# Patient Record
Sex: Female | Born: 1945 | Race: White | Hispanic: No | Marital: Married | Smoking: Never smoker
Health system: Southern US, Community
[De-identification: ages and names within clinical notes are randomized; demographics above are authoritative.]

## PROBLEM LIST (undated history)

## (undated) DIAGNOSIS — H6980 Other specified disorders of Eustachian tube, unspecified ear: Secondary | ICD-10-CM

## (undated) DIAGNOSIS — H6990 Unspecified Eustachian tube disorder, unspecified ear: Secondary | ICD-10-CM

## (undated) DIAGNOSIS — R911 Solitary pulmonary nodule: Secondary | ICD-10-CM

## (undated) DIAGNOSIS — I10 Essential (primary) hypertension: Secondary | ICD-10-CM

## (undated) DIAGNOSIS — E119 Type 2 diabetes mellitus without complications: Secondary | ICD-10-CM

## (undated) DIAGNOSIS — F419 Anxiety disorder, unspecified: Secondary | ICD-10-CM

## (undated) DIAGNOSIS — J189 Pneumonia, unspecified organism: Secondary | ICD-10-CM

## (undated) DIAGNOSIS — I639 Cerebral infarction, unspecified: Secondary | ICD-10-CM

## (undated) DIAGNOSIS — I779 Disorder of arteries and arterioles, unspecified: Secondary | ICD-10-CM

## (undated) DIAGNOSIS — E039 Hypothyroidism, unspecified: Secondary | ICD-10-CM

## (undated) DIAGNOSIS — M797 Fibromyalgia: Secondary | ICD-10-CM

## (undated) DIAGNOSIS — G4733 Obstructive sleep apnea (adult) (pediatric): Secondary | ICD-10-CM

## (undated) DIAGNOSIS — G709 Myoneural disorder, unspecified: Secondary | ICD-10-CM

## (undated) DIAGNOSIS — F32A Depression, unspecified: Secondary | ICD-10-CM

## (undated) DIAGNOSIS — K219 Gastro-esophageal reflux disease without esophagitis: Secondary | ICD-10-CM

## (undated) DIAGNOSIS — M1990 Unspecified osteoarthritis, unspecified site: Secondary | ICD-10-CM

## (undated) DIAGNOSIS — M8580 Other specified disorders of bone density and structure, unspecified site: Secondary | ICD-10-CM

## (undated) DIAGNOSIS — F329 Major depressive disorder, single episode, unspecified: Secondary | ICD-10-CM

## (undated) DIAGNOSIS — E785 Hyperlipidemia, unspecified: Secondary | ICD-10-CM

## (undated) HISTORY — DX: Type 2 diabetes mellitus without complications: E11.9

## (undated) HISTORY — DX: Unspecified eustachian tube disorder, unspecified ear: H69.90

## (undated) HISTORY — DX: Other specified disorders of bone density and structure, unspecified site: M85.80

## (undated) HISTORY — PX: COLONOSCOPY: SHX174

## (undated) HISTORY — PX: DILATION AND CURETTAGE OF UTERUS: SHX78

## (undated) HISTORY — DX: Disorder of arteries and arterioles, unspecified: I77.9

## (undated) HISTORY — DX: Essential (primary) hypertension: I10

## (undated) HISTORY — PX: TONSILLECTOMY: SUR1361

## (undated) HISTORY — DX: Hypothyroidism, unspecified: E03.9

## (undated) HISTORY — PX: TUBAL LIGATION: SHX77

## (undated) HISTORY — PX: NASAL SINUS SURGERY: SHX719

## (undated) HISTORY — DX: Solitary pulmonary nodule: R91.1

## (undated) HISTORY — PX: OTHER SURGICAL HISTORY: SHX169

## (undated) HISTORY — DX: Hyperlipidemia, unspecified: E78.5

## (undated) HISTORY — DX: Obstructive sleep apnea (adult) (pediatric): G47.33

## (undated) HISTORY — DX: Other specified disorders of Eustachian tube, unspecified ear: H69.80

## (undated) HISTORY — PX: ANKLE SURGERY: SHX546

## (undated) HISTORY — DX: Gastro-esophageal reflux disease without esophagitis: K21.9

## (undated) HISTORY — PX: EYE SURGERY: SHX253

## (undated) HISTORY — PX: CHOLECYSTECTOMY: SHX55

## (undated) HISTORY — PX: TOTAL ABDOMINAL HYSTERECTOMY: SHX209

## (undated) HISTORY — PX: BUNIONECTOMY: SHX129

---
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
## 1998-05-16 ENCOUNTER — Other Ambulatory Visit: Admission: RE | Admit: 1998-05-16 | Discharge: 1998-05-16 | Payer: Self-pay | Admitting: Orthopedic Surgery

## 1998-07-09 ENCOUNTER — Emergency Department (HOSPITAL_COMMUNITY): Admission: EM | Admit: 1998-07-09 | Discharge: 1998-07-09 | Payer: Self-pay | Admitting: Emergency Medicine

## 1998-07-09 ENCOUNTER — Encounter: Payer: Self-pay | Admitting: Emergency Medicine

## 1998-12-20 ENCOUNTER — Encounter: Payer: Self-pay | Admitting: Emergency Medicine

## 1998-12-20 ENCOUNTER — Inpatient Hospital Stay (HOSPITAL_COMMUNITY): Admission: EM | Admit: 1998-12-20 | Discharge: 1998-12-22 | Payer: Self-pay | Admitting: Emergency Medicine

## 1998-12-22 ENCOUNTER — Encounter (HOSPITAL_BASED_OUTPATIENT_CLINIC_OR_DEPARTMENT_OTHER): Payer: Self-pay | Admitting: Internal Medicine

## 1999-02-06 ENCOUNTER — Encounter: Admission: RE | Admit: 1999-02-06 | Discharge: 1999-02-06 | Payer: Self-pay | Admitting: Otolaryngology

## 1999-02-06 ENCOUNTER — Encounter: Payer: Self-pay | Admitting: Otolaryngology

## 1999-02-20 ENCOUNTER — Emergency Department (HOSPITAL_COMMUNITY): Admission: EM | Admit: 1999-02-20 | Discharge: 1999-02-20 | Payer: Self-pay | Admitting: Emergency Medicine

## 1999-04-16 ENCOUNTER — Emergency Department (HOSPITAL_COMMUNITY): Admission: EM | Admit: 1999-04-16 | Discharge: 1999-04-16 | Payer: Self-pay | Admitting: Emergency Medicine

## 1999-05-08 ENCOUNTER — Emergency Department (HOSPITAL_COMMUNITY): Admission: EM | Admit: 1999-05-08 | Discharge: 1999-05-08 | Payer: Self-pay | Admitting: Emergency Medicine

## 1999-05-16 ENCOUNTER — Emergency Department (HOSPITAL_COMMUNITY): Admission: EM | Admit: 1999-05-16 | Discharge: 1999-05-16 | Payer: Self-pay | Admitting: Emergency Medicine

## 1999-05-22 ENCOUNTER — Emergency Department (HOSPITAL_COMMUNITY): Admission: EM | Admit: 1999-05-22 | Discharge: 1999-05-22 | Payer: Self-pay

## 1999-05-29 ENCOUNTER — Ambulatory Visit (HOSPITAL_COMMUNITY): Admission: RE | Admit: 1999-05-29 | Discharge: 1999-05-29 | Payer: Self-pay | Admitting: Gastroenterology

## 1999-07-02 ENCOUNTER — Ambulatory Visit (HOSPITAL_BASED_OUTPATIENT_CLINIC_OR_DEPARTMENT_OTHER): Admission: RE | Admit: 1999-07-02 | Discharge: 1999-07-02 | Payer: Self-pay | Admitting: Orthopedic Surgery

## 1999-07-09 ENCOUNTER — Encounter: Payer: Self-pay | Admitting: *Deleted

## 1999-07-09 ENCOUNTER — Observation Stay (HOSPITAL_COMMUNITY): Admission: EM | Admit: 1999-07-09 | Discharge: 1999-07-10 | Payer: Self-pay | Admitting: *Deleted

## 1999-10-24 ENCOUNTER — Emergency Department (HOSPITAL_COMMUNITY): Admission: EM | Admit: 1999-10-24 | Discharge: 1999-10-24 | Payer: Self-pay

## 1999-11-05 ENCOUNTER — Emergency Department (HOSPITAL_COMMUNITY): Admission: EM | Admit: 1999-11-05 | Discharge: 1999-11-05 | Payer: Self-pay | Admitting: Emergency Medicine

## 2000-07-06 ENCOUNTER — Other Ambulatory Visit: Admission: RE | Admit: 2000-07-06 | Discharge: 2000-07-06 | Payer: Self-pay | Admitting: Obstetrics and Gynecology

## 2000-09-19 ENCOUNTER — Encounter: Admission: RE | Admit: 2000-09-19 | Discharge: 2000-09-19 | Payer: Self-pay | Admitting: Obstetrics and Gynecology

## 2000-09-19 ENCOUNTER — Encounter: Payer: Self-pay | Admitting: Obstetrics and Gynecology

## 2000-09-20 ENCOUNTER — Inpatient Hospital Stay (HOSPITAL_COMMUNITY): Admission: RE | Admit: 2000-09-20 | Discharge: 2000-09-22 | Payer: Self-pay | Admitting: Obstetrics and Gynecology

## 2000-10-27 ENCOUNTER — Encounter: Payer: Self-pay | Admitting: Allergy and Immunology

## 2000-10-27 ENCOUNTER — Encounter: Admission: RE | Admit: 2000-10-27 | Discharge: 2000-10-27 | Payer: Self-pay | Admitting: *Deleted

## 2000-11-18 ENCOUNTER — Emergency Department (HOSPITAL_COMMUNITY): Admission: EM | Admit: 2000-11-18 | Discharge: 2000-11-19 | Payer: Self-pay | Admitting: Emergency Medicine

## 2000-11-18 ENCOUNTER — Encounter: Payer: Self-pay | Admitting: Emergency Medicine

## 2000-11-25 ENCOUNTER — Emergency Department (HOSPITAL_COMMUNITY): Admission: EM | Admit: 2000-11-25 | Discharge: 2000-11-25 | Payer: Self-pay | Admitting: Emergency Medicine

## 2001-04-25 ENCOUNTER — Ambulatory Visit (HOSPITAL_BASED_OUTPATIENT_CLINIC_OR_DEPARTMENT_OTHER): Admission: RE | Admit: 2001-04-25 | Discharge: 2001-04-25 | Payer: Self-pay | Admitting: Podiatry

## 2001-06-10 ENCOUNTER — Emergency Department (HOSPITAL_COMMUNITY): Admission: EM | Admit: 2001-06-10 | Discharge: 2001-06-10 | Payer: Self-pay | Admitting: Emergency Medicine

## 2001-07-24 ENCOUNTER — Other Ambulatory Visit: Admission: RE | Admit: 2001-07-24 | Discharge: 2001-07-24 | Payer: Self-pay | Admitting: Obstetrics and Gynecology

## 2002-09-03 ENCOUNTER — Other Ambulatory Visit: Admission: RE | Admit: 2002-09-03 | Discharge: 2002-09-03 | Payer: Self-pay | Admitting: Obstetrics and Gynecology

## 2003-01-17 ENCOUNTER — Observation Stay (HOSPITAL_COMMUNITY): Admission: RE | Admit: 2003-01-17 | Discharge: 2003-01-18 | Payer: Self-pay | Admitting: General Surgery

## 2003-01-17 ENCOUNTER — Encounter (INDEPENDENT_AMBULATORY_CARE_PROVIDER_SITE_OTHER): Payer: Self-pay | Admitting: Specialist

## 2003-04-14 ENCOUNTER — Emergency Department (HOSPITAL_COMMUNITY): Admission: AD | Admit: 2003-04-14 | Discharge: 2003-04-14 | Payer: Self-pay | Admitting: Internal Medicine

## 2003-08-26 ENCOUNTER — Emergency Department (HOSPITAL_COMMUNITY): Admission: EM | Admit: 2003-08-26 | Discharge: 2003-08-26 | Payer: Self-pay | Admitting: Family Medicine

## 2003-09-14 ENCOUNTER — Encounter: Admission: RE | Admit: 2003-09-14 | Discharge: 2003-09-14 | Payer: Self-pay | Admitting: Rheumatology

## 2003-10-25 ENCOUNTER — Other Ambulatory Visit: Admission: RE | Admit: 2003-10-25 | Discharge: 2003-10-25 | Payer: Self-pay | Admitting: Obstetrics and Gynecology

## 2004-01-14 ENCOUNTER — Ambulatory Visit (HOSPITAL_COMMUNITY): Admission: RE | Admit: 2004-01-14 | Discharge: 2004-01-14 | Payer: Self-pay | Admitting: Obstetrics and Gynecology

## 2004-01-14 ENCOUNTER — Encounter (INDEPENDENT_AMBULATORY_CARE_PROVIDER_SITE_OTHER): Payer: Self-pay | Admitting: Specialist

## 2004-04-10 ENCOUNTER — Ambulatory Visit: Payer: Self-pay | Admitting: Internal Medicine

## 2004-04-22 ENCOUNTER — Ambulatory Visit: Payer: Self-pay | Admitting: Internal Medicine

## 2006-01-17 ENCOUNTER — Inpatient Hospital Stay (HOSPITAL_COMMUNITY): Admission: EM | Admit: 2006-01-17 | Discharge: 2006-01-21 | Payer: Self-pay | Admitting: Emergency Medicine

## 2006-03-08 ENCOUNTER — Encounter: Admission: RE | Admit: 2006-03-08 | Discharge: 2006-03-08 | Payer: Self-pay | Admitting: Obstetrics and Gynecology

## 2006-06-08 ENCOUNTER — Emergency Department (HOSPITAL_COMMUNITY): Admission: EM | Admit: 2006-06-08 | Discharge: 2006-06-08 | Payer: Self-pay | Admitting: Emergency Medicine

## 2006-09-30 ENCOUNTER — Emergency Department (HOSPITAL_COMMUNITY): Admission: EM | Admit: 2006-09-30 | Discharge: 2006-09-30 | Payer: Self-pay | Admitting: Emergency Medicine

## 2007-02-17 ENCOUNTER — Ambulatory Visit: Payer: Self-pay | Admitting: Internal Medicine

## 2007-02-28 ENCOUNTER — Ambulatory Visit (HOSPITAL_BASED_OUTPATIENT_CLINIC_OR_DEPARTMENT_OTHER): Admission: RE | Admit: 2007-02-28 | Discharge: 2007-02-28 | Payer: Self-pay | Admitting: Internal Medicine

## 2007-03-02 ENCOUNTER — Encounter: Payer: Self-pay | Admitting: Pulmonary Disease

## 2007-03-05 ENCOUNTER — Ambulatory Visit: Payer: Self-pay | Admitting: Internal Medicine

## 2007-03-14 ENCOUNTER — Ambulatory Visit: Payer: Self-pay | Admitting: Internal Medicine

## 2007-03-14 DIAGNOSIS — G4733 Obstructive sleep apnea (adult) (pediatric): Secondary | ICD-10-CM

## 2007-03-14 DIAGNOSIS — IMO0001 Reserved for inherently not codable concepts without codable children: Secondary | ICD-10-CM | POA: Insufficient documentation

## 2007-03-14 DIAGNOSIS — E039 Hypothyroidism, unspecified: Secondary | ICD-10-CM | POA: Insufficient documentation

## 2007-03-20 ENCOUNTER — Encounter: Payer: Self-pay | Admitting: Internal Medicine

## 2007-03-21 ENCOUNTER — Encounter: Payer: Self-pay | Admitting: Internal Medicine

## 2007-03-23 HISTORY — PX: PATELLA FRACTURE SURGERY: SHX735

## 2007-03-23 HISTORY — PX: WRIST FRACTURE SURGERY: SHX121

## 2007-03-26 ENCOUNTER — Emergency Department: Payer: Self-pay | Admitting: Emergency Medicine

## 2007-04-21 ENCOUNTER — Ambulatory Visit: Payer: Self-pay | Admitting: Internal Medicine

## 2007-06-19 ENCOUNTER — Encounter: Payer: Self-pay | Admitting: Internal Medicine

## 2007-07-08 ENCOUNTER — Emergency Department: Payer: Self-pay | Admitting: Emergency Medicine

## 2007-07-12 ENCOUNTER — Encounter: Admission: RE | Admit: 2007-07-12 | Discharge: 2007-07-12 | Payer: Self-pay | Admitting: Orthopedic Surgery

## 2007-07-17 ENCOUNTER — Inpatient Hospital Stay (HOSPITAL_COMMUNITY): Admission: RE | Admit: 2007-07-17 | Discharge: 2007-07-20 | Payer: Self-pay | Admitting: Orthopedic Surgery

## 2007-08-20 ENCOUNTER — Encounter: Payer: Self-pay | Admitting: Internal Medicine

## 2007-08-21 ENCOUNTER — Ambulatory Visit: Payer: Self-pay | Admitting: Internal Medicine

## 2008-04-29 ENCOUNTER — Ambulatory Visit (HOSPITAL_COMMUNITY): Admission: RE | Admit: 2008-04-29 | Discharge: 2008-04-30 | Payer: Self-pay | Admitting: Otolaryngology

## 2008-08-11 ENCOUNTER — Inpatient Hospital Stay (HOSPITAL_COMMUNITY): Admission: EM | Admit: 2008-08-11 | Discharge: 2008-08-13 | Payer: Self-pay | Admitting: Emergency Medicine

## 2008-08-28 ENCOUNTER — Encounter: Admission: RE | Admit: 2008-08-28 | Discharge: 2008-08-28 | Payer: Self-pay | Admitting: Otolaryngology

## 2008-09-10 ENCOUNTER — Encounter: Admission: RE | Admit: 2008-09-10 | Discharge: 2008-09-10 | Payer: Self-pay | Admitting: Obstetrics and Gynecology

## 2008-11-03 ENCOUNTER — Encounter: Payer: Self-pay | Admitting: Internal Medicine

## 2008-11-19 ENCOUNTER — Ambulatory Visit: Payer: Self-pay | Admitting: Internal Medicine

## 2009-04-29 ENCOUNTER — Encounter (INDEPENDENT_AMBULATORY_CARE_PROVIDER_SITE_OTHER): Payer: Self-pay | Admitting: *Deleted

## 2009-10-09 ENCOUNTER — Encounter: Payer: Self-pay | Admitting: Internal Medicine

## 2009-10-20 ENCOUNTER — Telehealth: Payer: Self-pay | Admitting: Internal Medicine

## 2009-11-16 ENCOUNTER — Encounter: Payer: Self-pay | Admitting: Internal Medicine

## 2009-11-18 ENCOUNTER — Ambulatory Visit: Payer: Self-pay | Admitting: Internal Medicine

## 2009-11-18 DIAGNOSIS — H6980 Other specified disorders of Eustachian tube, unspecified ear: Secondary | ICD-10-CM

## 2009-11-18 DIAGNOSIS — R930 Abnormal findings on diagnostic imaging of skull and head, not elsewhere classified: Secondary | ICD-10-CM

## 2009-11-18 DIAGNOSIS — I10 Essential (primary) hypertension: Secondary | ICD-10-CM | POA: Insufficient documentation

## 2009-11-21 ENCOUNTER — Telehealth (INDEPENDENT_AMBULATORY_CARE_PROVIDER_SITE_OTHER): Payer: Self-pay | Admitting: *Deleted

## 2009-11-23 DIAGNOSIS — K219 Gastro-esophageal reflux disease without esophagitis: Secondary | ICD-10-CM | POA: Insufficient documentation

## 2009-11-23 DIAGNOSIS — E785 Hyperlipidemia, unspecified: Secondary | ICD-10-CM

## 2009-11-25 ENCOUNTER — Telehealth (INDEPENDENT_AMBULATORY_CARE_PROVIDER_SITE_OTHER): Payer: Self-pay | Admitting: *Deleted

## 2009-11-27 ENCOUNTER — Ambulatory Visit: Payer: Self-pay | Admitting: Internal Medicine

## 2009-12-02 LAB — CONVERTED CEMR LAB
BUN: 17 mg/dL (ref 6–23)
CO2: 27 meq/L (ref 19–32)
Calcium: 9.4 mg/dL (ref 8.4–10.5)
Chloride: 104 meq/L (ref 96–112)
Creatinine, Ser: 0.8 mg/dL (ref 0.4–1.2)
GFR calc non Af Amer: 81.74 mL/min (ref >60)
Glucose, Bld: 115 mg/dL — ABNORMAL HIGH (ref 70–99)
Potassium: 5.1 meq/L (ref 3.5–5.1)
Sodium: 140 meq/L (ref 135–145)

## 2010-04-21 NOTE — Assessment & Plan Note (Signed)
Summary: rov 1 yr ///kp   Primary Burch [place]/Referring Guillermina Shaft:  Jacky Kindle  CC:  Yearly follow up visit-still not sleeping alot; no complaints on CPAP machine. Took card to [facility] yesterday for complaince report.Marland Kitchen  History of Present Illness: 09-18-07- OSA Mrs. Osorto felt from a ladder breaking her right wrist and right knee, requiring surgery.  She tolerated this without report of any respiratory or apnea.  Problems.  Her original CPAP was titrated to 14.  She had a home compliance.  Study showing good compliance and good control with an index of 4.6 on CPAP of 14.  11/19/08- OSA Returns for f/u, having been compliant on cpap 14. At her last titration download, she held AHI of 3.5 on this setting. She complains it overdries her despite humidifier, without keeping her awake.We discussed pressure setting options.  November 18, 2009- OSA We had reduced CPAP to 13 to help overdrying. This has been more comfortable. It still bothers her at times with a nasal mask and humidifier. Still not sleeping as wellas she hoped. Trying to lose weight- down 30 lbs. Compliance download- excellent- wears it 93% of nights with AHI 3.3 indicating good compliance and control at 13. Incidental c/o "swimmers ear" Rt x 4-5 days. Last swimming 3 weeks ago. Began with itching, now pressure. Has bilateral decreased hearing anyway. Urology w/u for hematuria with CT abdomen that shwed small bilateral pulmonary nodules in lower zones.    Preventive Screening-Counseling & Management  Alcohol-Tobacco     Smoking Status: never  Current Medications (verified): 1)  Levoxyl 25 Mcg  Tabs (Levothyroxine Sodium) .... Once Daily 2)  Alprazolam 0.5 Mg  Tb24 (Alprazolam) .... As Needed 3)  Cpap 13 Advanced 4)  Metoprolol Tartrate 25 Mg Tabs (Metoprolol Tartrate) .... Take 1 By Mouth Two Times A Day 5)  Metformin Hcl 500 Mg Tabs (Metformin Hcl) .... Take 2 By Mouth Two Times A Day 6)  Citalopram Hydrobromide 20 Mg Tabs (Citalopram  Hydrobromide) .... Take 1.5 By Mouth Once Daily 7)  Estrace 0.1 Mg/gm Crea (Estradiol) .... Twice A Week 8)  Calcium 600 Mg Tabs (Calcium) .... Take 1 By Mouth Two Times A Day 9)  Vitamin D3 2000 Unit Caps (Cholecalciferol) .... Take 1 By Mouth Once Daily  Allergies (verified): No Known Drug Allergies  Past History:  Family History: Last updated: 09/18/07 Mother- deceased age 51; heart attack Father- deceased age 41; colon cancer Brother- deceased age 71; car accident Brother-living age 59; unknown med problems Sister- living age 58; unknown med problems  Social History: Last updated: 09/18/2007 Disabilty Married with 2 children non-smoker no ETOH no Street Drug use no risk for HIV  Risk Factors: Smoking Status: never (11/18/2009)  Past Medical History: Sleep Apnea Eustachian dysfunction Hypothyroidism Hypertension G E R D Hyperlipidemia Osteopenia Lung nodules- CT abdomen by [facility] 09/29/2009  Past Surgical History: Colonoscopy Repair wrist and right knee fractures 2009 Ankle surgery Anteriorvesicourethropexy Total Abdominal Hysterectomy Bunion surgery Sinus surgery Tonsillectomy  Review of Systems      See HPI       The patient complains of weight loss and hematuria.  The patient denies anorexia, fever, weight gain, vision loss, decreased hearing, hoarseness, chest pain, syncope, dyspnea on exertion, peripheral edema, prolonged cough, headaches, hemoptysis, abdominal pain, melena, severe indigestion/heartburn, suspicious skin lesions, difficulty walking, abnormal bleeding, and enlarged lymph nodes.    Vital Signs:  Patient profile:   65 year old female Height:      63.5 inches Weight:  192.25 pounds BMI:     33.64 O2 Sat:      98 % on Room air Pulse rate:   54 / minute BP sitting:   140 / 82  (left arm) Cuff size:   regular  Vitals Entered By: Reynaldo Minium CMA (November 18, 2009 9:10 AM)  O2 Flow:  Room air CC: Yearly follow up  visit-still not sleeping alot; no complaints on CPAP machine. Took card to [facility] yesterday for complaince report.   Physical Exam  Additional Exam:  General: A/Ox3; pleasant and cooperative, NAD, overweight SKIN: no rash, lesions NODES: no lymphadenopathy HEENT: [place]/AT, EOM- WNL, Conjuctivae- clear, PERRLA, TM-Right TM is retacted w/o red/ fluid or discharge. Left is WNL, Nose- clear, Throat- clear and wnl, Mallampati III NECK: Supple w/ fair ROM, JVD- none, normal carotid impulses w/o bruits Thyroid- normal to palpation CHEST: Clear to P&A HEART: RRR, no m/g/r heard ABDOMEN: Soft and nl; NUU:VOZD, nl pulses, no edema  NEURO: Grossly intact to observation      Impression & Recommendations:  Problem # 1:  OBSTRUCTIVE SLEEP APNEA (ICD-327.23)  Good compliance and control. We discussed weight loss and CPAP pressure adjustment, compliance and medical issues of untreated CPAP. She is well motivated.  Problem # 2:  EUSTACHIAN TUBE DYSFUNCTION (ICD-381.81)  She has eustachian dysfunction on right. We wil give neb neo nasal aerosol Rx today to see if that will open her up. She says she was hosp once in past for IV ABX for "swimmers ear " and doesn't want a recurrence. i will give her ciprodex to use and encourage decongestants as tol by her BP.  Problem # 3:  CT, CHEST, ABNORMAL (ICD-793.1)  She says a CT done at [facility] for hematuria (now told ok) captured lower lung zones and saw nodules. We will get report for review.  Addendum: [facility] note by Dr Vernie Ammons with report of CT. Small bibasilar nodules. We have no comparison. Will need to CT chest.  Medications Added to Medication List This Visit: 1)  Metoprolol Tartrate 25 Mg Tabs (Metoprolol tartrate) .... Take 1 by mouth two times a day 2)  Metformin Hcl 500 Mg Tabs (Metformin hcl) .... Take 2 by mouth two times a day 3)  Citalopram Hydrobromide 20 Mg Tabs (Citalopram hydrobromide) .... Take 1.5 by mouth once daily 4)   Estrace 0.1 Mg/gm Crea (Estradiol) .... Twice a week 5)  Calcium 600 Mg Tabs (Calcium) .... Take 1 by mouth two times a day 6)  Vitamin D3 2000 Unit Caps (Cholecalciferol) .... Take 1 by mouth once daily 7)  Ciprodex 0.3-0.1 % Susp (Ciprofloxacin-dexamethasone) .Marland Kitchen.. 1-2 drops in affectd ear two times a day  Other Orders: Est. Patient Level IV (66440) Nebulizer Tx (34742) Radiology Referral (Radiology) TLB-BMP (Basic Metabolic Panel-BMET) (80048-METABOL)  Patient Instructions: 1)  Please schedule a follow-up appointment in 6 months. 2)  I will call you about the CT report. If I don't, then you call me. 3)  Neb neo nasal 4)  Script antibiotic ear drop to use if your ear gets worse. 5)  Continue CPAP at 13- used as much of every night as you can. Prescriptions: CIPRODEX 0.3-0.1 % SUSP (CIPROFLOXACIN-DEXAMETHASONE) 1-2 drops in affectd ear two times a day  #1 vial x 0   Entered and Authorized by:   Waymon Budge MD   Signed by:   Waymon Budge MD on 11/18/2009   Method used:   Print then Give to Patient   RxID:  [id number]   Prevention & Chronic Care Immunizations   Influenza vaccine: Not documented    Tetanus booster: Not documented    Pneumococcal vaccine: Not documented    H. zoster vaccine: Not documented  Colorectal Screening   Hemoccult: Not documented    Colonoscopy: Not documented  Other Screening   Pap smear: Not documented    Mammogram: Not documented    DXA bone density scan: Not documented   Smoking status: never  (11/18/2009)  Lipids   Total Cholesterol: Not documented   LDL: Not documented   LDL Direct: Not documented   HDL: Not documented   Triglycerides: Not documented    SGOT (AST): Not documented   SGPT (ALT): Not documented   Alkaline phosphatase: Not documented   Total bilirubin: Not documented  Hypertension   Last Blood Pressure: 140 / 82  (11/18/2009)   Serum creatinine: Not documented   Serum potassium Not  documented  Self-Management Support :    Hypertension self-management support: Not documented    Lipid self-management support: Not documented       Medication Administration  Medication # 1:    Medication: EMR miscellaneous medications    Diagnosis: EUSTACHIAN TUBE DYSFUNCTION (ICD-381.81)    Dose: 3 drops    Route: intranasal    Exp Date: 03/2011    Lot #: [id number]    Mfr: Bayer    Comments: Neo-Synephrine    Patient tolerated medication without complications    Given by: Reynaldo Minium CMA (November 18, 2009 10:09 AM)  Orders Added: 1)  Est. Patient Level IV [84696] 2)  Nebulizer Tx [29528] 3)  Radiology Referral [Radiology] 4)  TLB-BMP (Basic Metabolic Panel-BMET) [80048-METABOL]   Appended Document: rov 1 yr ///kp CT abdomen: the CT done at [facility] in July showed bilateral lower lobe lung nodules. We need to get a CT of the chest with contrast to better define these and look for others. Please let her knw we will be ordering CT chest with contrast, and also chemistry panel. The blood work she had at [facility] in July is too far back.  Appended Document: rov 1 yr ///kp Pt is aware and has CT set up on 11-27-09 already.

## 2010-04-21 NOTE — Letter (Signed)
Summary: OV w CT,labs/[facility]  OV w CT,labs/[facility]   Imported By: Lester Foxfire 12/01/2009 08:49:26  _____________________________________________________________________  External Attachment:    Type:   Image     Comment:   External Document

## 2010-04-21 NOTE — Letter (Signed)
Summary: Colonoscopy Letter  [facility]  [address] [place], [place] [address]   Phone: [phone]  Fax: [phone]      April 29, 2009 MRN: [id number]   [facility] [address] [place], [place]  [address]   Dear [name],   According to your medical record, it is time for you to schedule a Colonoscopy. The American Cancer Society recommends this procedure as a method to detect early colon cancer. Patients with a family history of colon cancer, or a personal history of colon polyps or inflammatory bowel disease are at increased risk.  This letter has beeen generated based on the recommendations made at the time of your procedure. If you feel that in your particular situation this may no longer apply, please contact our office.  Please call our office at [phone] to schedule this appointment or to update your records at your earliest convenience.  Thank you for cooperating with [place] to provide you with the very best care possible.   Sincerely,  Wilhemina Bonito. Marina Goodell, M.D.  [facility] [phone]

## 2010-04-21 NOTE — Assessment & Plan Note (Signed)
Summary: 1 yr f/u ///kp   Visit Type:  Follow-up Primary Provider/Referring Provider:  Jacky Kindle  CC:  yearly follow up - would like to discuss changing pressure.  History of Present Illness: 2007/08/27- OSA [name] felt from a ladder breaking her right wrist and right knee, requiring surgery.  She tolerated this without report of any respiratory or apnea.  Problems.  Her original CPAP was titrated to 14.  She had a home compliance.  Study showing good compliance and good control with an index of 4.6 on CPAP of 14.  11/19/08- OSA Returns for f/u, having been compliant on cpap 14. At her last titration download, she held AHI of 3.5 on this setting. She complains it overdries her despite humidifier, without keeping her awake.We discussed pressure setting options.    Current Medications (verified): 1)  Lisinopril 10 Mg  Tabs (Lisinopril) .... Once Daily 2)  Levoxyl 25 Mcg  Tabs (Levothyroxine Sodium) .... Once Daily 3)  Lexapro 20 Mg  Tabs (Escitalopram Oxalate) .... Two Times A Day 4)  Alprazolam 0.5 Mg  Tb24 (Alprazolam) .... As Needed 5)  Cpap 14 .... At Bedtime  Allergies (verified): No Known Drug Allergies  Past History:  Past Medical History: Last updated: 04/21/2007 Sleep Apnea  Past Surgical History: Last updated: 27-Aug-2007 Colonoscopy Repair wrist and right knee fractures 2009  Family History: Last updated: 2007-08-27 Mother- deceased age 38; heart attack Father- deceased age 58; colon cancer Brother- deceased age 66; car accident Brother-living age 8; unknown med problems Sister- living age 85; unknown med problems  Social History: Last updated: 08-27-07 Disabilty Married with 2 children non-smoker no ETOH no Street Drug use no risk for HIV  Risk Factors: Smoking Status: never (03/14/2007)  Review of Systems      See HPI  The patient denies anorexia, fever, weight loss, weight gain, vision loss, decreased hearing, hoarseness, chest pain, syncope,  dyspnea on exertion, peripheral edema, prolonged cough, headaches, hemoptysis, abdominal pain, melena, hematochezia, and severe indigestion/heartburn.    Physical Exam  Additional Exam:  General: A/Ox3; pleasant and cooperative, NAD, overweight SKIN: no rash, lesions NODES: no lymphadenopathy HEENT: [place]/AT, EOM- WNL, Conjuctivae- clear, PERRLA, TM-WNL, Nose- clear, Throat- clear and wnl, Melampatti III NECK: Supple w/ fair ROM, JVD- none, normal carotid impulses w/o bruits Thyroid- normal to palpation CHEST: Clear to P&A HEART: RRR, no m/g/r heard ABDOMEN: Soft and nl; QMV:HQIO, nl pulses, no edema  NEURO: Grossly intact to observation      Impression & Recommendations:  Problem # 1:  OBSTRUCTIVE SLEEP APNEA (ICD-327.23)  Good compliance and control Short sleeper, but not having daytime sleepiness. We can afford to reduce the pressure a step, down to 13, to reduce drying. She has a nasal mask.  Medications Added to Medication List This Visit: 1)  Alprazolam 0.5 Mg Tb24 (Alprazolam) .... As needed 2)  Cpap 14  .... At bedtime 3)  Cpap 13 Advanced   Other Orders: Est. Patient Level III (96295) DME Referral (DME)  Patient Instructions: 1)  Please schedule a follow-up appointment in 1 year. 2)  Continue CPAP. We will contact [facility] to reduce your CPAP to 13 - hopefully this will ease some of the dry feeling.

## 2010-04-21 NOTE — Progress Notes (Signed)
Summary: Switched to Dr. Medoff-GI/records requested and sent  Phone Note Other Incoming   Request: Send information Summary of Call: Request for records received from [facility]. Faxed patient's Colon and Path from 2006 to [phone].

## 2010-04-21 NOTE — Progress Notes (Signed)
Summary: results  Phone Note Call from Patient   Caller: Patient Call For: young Summary of Call: calling about lung nodules results from ct Initial call taken by: Rickard Patience,  November 21, 2009 1:15 PM  Follow-up for Phone Call        Dr. Maple Hudson, Pt states she was to call you back in a few days to see if you had read the CT report.  Would like to know if this has been done and if so, would like your recs.  Pls advise.  Thanks!  Gweneth Dimitri RN  November 21, 2009 2:06 PM   Additional Follow-up for Phone Call Additional follow up Details #1::        We sent the release of information to [facility], but have not yet received the results or images from the CT they did that showed lung nodules. [facility] and [facility]. Additional Follow-up by: Waymon Budge MD,  November 21, 2009 2:47 PM    Additional Follow-up for Phone Call Additional follow up Details #2::    Called, spoke with pt.  Pt informed of above statement per CY.  She verbalized understanding and stated she would call [facility].  Gweneth Dimitri RN  November 21, 2009 3:03 PM

## 2010-04-21 NOTE — Progress Notes (Signed)
Summary: paperwork from [facility]   Phone Note Call from Patient Call back at Home Phone [phone]   Caller: Patient Call For: young Reason for Call: Talk to Nurse Summary of Call: did we recieve 11 pages from [facility] on this pt, faxed 08/30? Initial call taken by: Eugene Gavia,  November 25, 2009 9:11 AM  Follow-up for Phone Call        Florentina Addison, have you seen paperwork on this pt from [facility]?  Aundra Millet Reynolds LPN  November 25, 2009 9:16 AM   Additional Follow-up for Phone Call Additional follow up Details #1::        Paperwork reviewed by [facility]-sent to have scanned in EMR chart and [facility] working on setting up Ct.Reynaldo Minium CMA  November 25, 2009 10:03 AM     Additional Follow-up for Phone Call Additional follow up Details #2::    called and spoke with pt.  pt aware we have received records from [facility].  Pt states she has already spoken with Almyra Free this morning and is aware of her CT appt date and time.  nothing further needed.  Aundra Millet Reynolds LPN  November 25, 2009 10:19 AM

## 2010-05-19 ENCOUNTER — Ambulatory Visit: Payer: Self-pay | Admitting: Internal Medicine

## 2010-05-25 ENCOUNTER — Encounter: Payer: Self-pay | Admitting: Internal Medicine

## 2010-05-28 ENCOUNTER — Encounter: Payer: Self-pay | Admitting: Internal Medicine

## 2010-05-28 ENCOUNTER — Ambulatory Visit (INDEPENDENT_AMBULATORY_CARE_PROVIDER_SITE_OTHER): Payer: Medicare Other | Admitting: Internal Medicine

## 2010-05-28 DIAGNOSIS — J309 Allergic rhinitis, unspecified: Secondary | ICD-10-CM

## 2010-05-28 DIAGNOSIS — R911 Solitary pulmonary nodule: Secondary | ICD-10-CM | POA: Insufficient documentation

## 2010-05-28 DIAGNOSIS — J302 Other seasonal allergic rhinitis: Secondary | ICD-10-CM | POA: Insufficient documentation

## 2010-05-28 DIAGNOSIS — J3089 Other allergic rhinitis: Secondary | ICD-10-CM

## 2010-05-28 DIAGNOSIS — G4733 Obstructive sleep apnea (adult) (pediatric): Secondary | ICD-10-CM

## 2010-05-28 DIAGNOSIS — J984 Other disorders of lung: Secondary | ICD-10-CM

## 2010-06-09 NOTE — Assessment & Plan Note (Signed)
Summary: rov 6 months//kp   Primary Provider/Referring Provider:  Jacky Kindle  CC:  6 month follow up visit-OSa nad Ab CT chest scan; Discuss CPAP download. and Hypertension Management.  History of Present Illness: 11/19/08- OSA Returns for f/u, having been compliant on cpap 14. At her last titration download, she held AHI of 3.5 on this setting. She complains it overdries her despite humidifier, without keeping her awake.We discussed pressure setting options.  November 18, 2009- OSA We had reduced CPAP to 13 to help overdrying. This has been more comfortable. It still bothers her at times with a nasal mask and humidifier. Still not sleeping as wellas she hoped. Trying to lose weight- down 30 lbs. Compliance download- excellent- wears it 93% of nights with AHI 3.3 indicating good compliance and control at 13. Incidental c/o "swimmers ear" Rt x 4-5 days. Last swimming 3 weeks ago. Began with itching, now pressure. Has bilateral decreased hearing anyway. Urology w/u for hematuria with CT abdomen that showed small bilateral pulmonary nodules in lower zones.  May 28, 2010- OSA, rhinits.........Marland Kitchenhusband here Nurse-CC: 6 month follow up visit-OSa nad Ab CT chest scan; Discuss CPAP download. CPAP 13- Stuffy nose is interfering some, but husband doesn't know that she snores through it.  A recent download report shows short average time used but good control when used.. Mask can be a little claustrophobic- nasal mask.  CT- Stable nodules over 2 months, w/ 6 month f/u discussed and recommended.       Hypertension History:      Positive major cardiovascular risk factors include female age 60 years old or older, hyperlipidemia, and hypertension.  Negative major cardiovascular risk factors include non-tobacco-user status.     Preventive Screening-Counseling & Management  Alcohol-Tobacco     Smoking Status: never  Current Medications (verified): 1)  Levoxyl 25 Mcg  Tabs (Levothyroxine Sodium) ....  Once Daily 2)  Alprazolam 0.5 Mg  Tb24 (Alprazolam) .... As Needed 3)  Cpap 13 Advanced 4)  Metoprolol Tartrate 25 Mg Tabs (Metoprolol Tartrate) .... Take 1 By Mouth Two Times A Day 5)  Citalopram Hydrobromide 20 Mg Tabs (Citalopram Hydrobromide) .... Take 1.5 By Mouth Once Daily 6)  Estrace 0.1 Mg/gm Crea (Estradiol) .... Twice A Week 7)  Calcium 600 Mg Tabs (Calcium) .... Take 1 By Mouth Two Times A Day 8)  Vitamin D3 2000 Unit Caps (Cholecalciferol) .... Take 1 By Mouth Once Daily  Allergies (verified): No Known Drug Allergies  Past History:  Past Medical History: Last updated: 11/18/2009 Sleep Apnea Eustachian dysfunction Hypothyroidism Hypertension G E R D Hyperlipidemia Osteopenia Lung nodules- CT abdomen by [facility] 09/29/2009  Past Surgical History: Last updated: 11/18/2009 Colonoscopy Repair wrist and right knee fractures 2009 Ankle surgery Anteriorvesicourethropexy Total Abdominal Hysterectomy Bunion surgery Sinus surgery Tonsillectomy  Family History: Last updated: 2007-09-19 Mother- deceased age 65; heart attack Father- deceased age 82; colon cancer Brother- deceased age 34; car accident Brother-living age 42; unknown med problems Sister- living age 53; unknown med problems  Social History: Last updated: 09-19-07 Disabilty Married with 2 children non-smoker no ETOH no Street Drug use no risk for HIV  Risk Factors: Smoking Status: never (05/28/2010)  Review of Systems      See HPI  The patient denies shortness of breath with activity, shortness of breath at rest, productive cough, non-productive cough, coughing up blood, chest pain, irregular heartbeats, acid heartburn, indigestion, loss of appetite, weight change, abdominal pain, difficulty swallowing, sore throat, tooth/dental problems, headaches, nasal congestion/difficulty breathing  through nose, and sneezing.    Vital Signs:  Patient profile:   65 year old female Height:       63.5 inches Weight:      207.38 pounds BMI:     36.29 O2 Sat:      96 % on Room air Pulse rate:   62 / minute BP sitting:   138 / 82  (left arm) Cuff size:   regular  Vitals Entered By: Reynaldo Minium CMA (May 28, 2010 11:21 AM)  O2 Flow:  Room air CC: 6 month follow up visit-OSa nad Ab CT chest scan; Discuss CPAP download., Hypertension Management   Physical Exam  Additional Exam:  General: A/Ox3; pleasant and cooperative, NAD, overweight SKIN: no rash, lesions NODES: no lymphadenopathy HEENT: [place]/AT, EOM- WNL, Conjuctivae- clear, PERRLA, TM-Right TM is retacted w/o red/ fluid or discharge. Left is WNL, Nose- turbinate, Throat- clear and wnl, Mallampati III NECK: Supple w/ fair ROM, JVD- none, normal carotid impulses w/o bruits Thyroid- normal to palpation CHEST: Clear to P&A HEART: RRR, no m/g/r heard ABDOMEN: Soft and nl; EAV:WUJW, nl pulses, no edema  NEURO: Grossly intact to observation      Impression & Recommendations:  Problem # 1:  OBSTRUCTIVE SLEEP APNEA (ICD-327.23)  When we get pending download, if anything we may need to go a little lower on pressure for her comfort. ADDENDUM- pressure gives good control, but in effort to improve compliance, i will reduce from 13 to 12.  Orders: Est. Patient Level III (11914) DME Referral (DME)  Problem # 2:  ALLERGIC RHINITIS (ICD-477.9)  Add Nasonex and try sudafed or -PE  Orders: Est. Patient Level III (78295)  Problem # 3:  LUNG NODULE (ICD-518.89) Stable on CT, for f/u in 6 months.  Medications Added to Medication List This Visit: 1)  Cpap 12 Advanced   Hypertension Assessment/Plan:      The patient's hypertensive risk group is category B: At least one risk factor (excluding diabetes) with no target organ damage.  Today's blood pressure is 138/82.    Patient Instructions: 1)  Please schedule a follow-up appointment in 2 months. We will make sure at that point that CPAP is ok. I will respond to the current  download when available.  2)  Sample Nasonex nasal spray 3)     2 sprays each nostril every night at bedtime.  4)  Ok to try a decongestant- Sudafed or phenylephrine- ask the druggist 5)  Will need CT f/u at 6 months from last, for nodules.

## 2010-06-30 LAB — DIFFERENTIAL
Basophils Absolute: 0.1 10*3/uL (ref 0.0–0.1)
Basophils Relative: 1 % (ref 0–1)
Eosinophils Absolute: 0.2 10*3/uL (ref 0.0–0.7)
Eosinophils Relative: 2 % (ref 0–5)
Lymphocytes Relative: 18 % (ref 12–46)
Lymphs Abs: 2.1 10*3/uL (ref 0.7–4.0)
Monocytes Absolute: 0.7 10*3/uL (ref 0.1–1.0)
Monocytes Relative: 6 % (ref 3–12)
Neutro Abs: 8.5 10*3/uL — ABNORMAL HIGH (ref 1.7–7.7)
Neutrophils Relative %: 73 % (ref 43–77)

## 2010-06-30 LAB — CBC
HCT: 39.8 % (ref 36.0–46.0)
HCT: 42 % (ref 36.0–46.0)
Hemoglobin: 13.8 g/dL (ref 12.0–15.0)
Hemoglobin: 14.5 g/dL (ref 12.0–15.0)
MCHC: 34.5 g/dL (ref 30.0–36.0)
MCHC: 34.6 g/dL (ref 30.0–36.0)
MCV: 83.1 fL (ref 78.0–100.0)
MCV: 83.7 fL (ref 78.0–100.0)
MCV: 84.1 fL (ref 78.0–100.0)
Platelets: 236 10*3/uL (ref 150–400)
Platelets: 271 10*3/uL (ref 150–400)
RBC: 4.8 MIL/uL (ref 3.87–5.11)
RBC: 5.01 MIL/uL (ref 3.87–5.11)
RDW: 13.2 % (ref 11.5–15.5)
WBC: 11.6 10*3/uL — ABNORMAL HIGH (ref 4.0–10.5)
WBC: 8.2 10*3/uL (ref 4.0–10.5)
WBC: 9.6 10*3/uL (ref 4.0–10.5)

## 2010-06-30 LAB — CK TOTAL AND CKMB (NOT AT ARMC)
CK, MB: 3.4 ng/mL (ref 0.3–4.0)
Relative Index: 3.2 — ABNORMAL HIGH (ref 0.0–2.5)
Total CK: 107 U/L (ref 7–177)

## 2010-06-30 LAB — HEMOGLOBIN A1C: Mean Plasma Glucose: 148 mg/dL

## 2010-06-30 LAB — BASIC METABOLIC PANEL
BUN: 11 mg/dL (ref 6–23)
CO2: 23 mEq/L (ref 19–32)
Calcium: 9.1 mg/dL (ref 8.4–10.5)
Chloride: 108 mEq/L (ref 96–112)
Chloride: 108 mEq/L (ref 96–112)
Creatinine, Ser: 0.64 mg/dL (ref 0.4–1.2)
GFR calc Af Amer: >60 mL/min (ref >60)
GFR calc Af Amer: >60 mL/min (ref >60)
GFR calc non Af Amer: >60 mL/min (ref >60)
Glucose, Bld: 124 mg/dL — ABNORMAL HIGH (ref 70–99)
Potassium: 4.3 mEq/L (ref 3.5–5.1)
Potassium: 4.3 mEq/L (ref 3.5–5.1)
Sodium: 138 mEq/L (ref 135–145)
Sodium: 140 mEq/L (ref 135–145)

## 2010-06-30 LAB — CARDIAC PANEL(CRET KIN+CKTOT+MB+TROPI)
CK, MB: 2.6 ng/mL (ref 0.3–4.0)
CK, MB: 2.8 ng/mL (ref 0.3–4.0)
Relative Index: INVALID (ref 0.0–2.5)
Troponin I: 0.02 ng/mL (ref 0.00–0.06)

## 2010-06-30 LAB — GLUCOSE, CAPILLARY
Glucose-Capillary: 127 mg/dL — ABNORMAL HIGH (ref 70–99)
Glucose-Capillary: 92 mg/dL (ref 70–99)

## 2010-06-30 LAB — POCT CARDIAC MARKERS
CKMB, poc: 1.6 ng/mL (ref 1.0–8.0)
CKMB, poc: 1.8 ng/mL (ref 1.0–8.0)
Myoglobin, poc: 48.3 ng/mL (ref 12–200)
Myoglobin, poc: 69.7 ng/mL (ref 12–200)
Troponin i, poc: <0.05 ng/mL (ref 0.00–0.09)
Troponin i, poc: <0.05 ng/mL (ref 0.00–0.09)

## 2010-06-30 LAB — HEPATIC FUNCTION PANEL
ALT: 60 U/L — ABNORMAL HIGH (ref 0–35)
AST: 31 U/L (ref 0–37)
Albumin: 3.4 g/dL — ABNORMAL LOW (ref 3.5–5.2)
Alkaline Phosphatase: 79 U/L (ref 39–117)
Bilirubin, Direct: <0.1 mg/dL (ref 0.0–0.3)
Total Bilirubin: 0.4 mg/dL (ref 0.3–1.2)
Total Protein: 6.5 g/dL (ref 6.0–8.3)

## 2010-06-30 LAB — LIPID PANEL
Cholesterol: 224 mg/dL — ABNORMAL HIGH (ref 0–200)
Total CHOL/HDL Ratio: 5.6 RATIO

## 2010-06-30 LAB — PROTIME-INR
INR: 1 (ref 0.00–1.49)
Prothrombin Time: 13.7 seconds (ref 11.6–15.2)

## 2010-06-30 LAB — MAGNESIUM: Magnesium: 2.4 mg/dL (ref 1.5–2.5)

## 2010-06-30 LAB — APTT: aPTT: 26 seconds (ref 24–37)

## 2010-07-07 LAB — BASIC METABOLIC PANEL
BUN: 7 mg/dL (ref 6–23)
CO2: 28 mEq/L (ref 19–32)
Calcium: 10 mg/dL (ref 8.4–10.5)
Creatinine, Ser: 0.72 mg/dL (ref 0.4–1.2)
GFR calc Af Amer: >60 mL/min (ref >60)
Glucose, Bld: 108 mg/dL — ABNORMAL HIGH (ref 70–99)

## 2010-07-07 LAB — CBC
MCHC: 34.6 g/dL (ref 30.0–36.0)
Platelets: 273 10*3/uL (ref 150–400)
RDW: 13.1 % (ref 11.5–15.5)

## 2010-07-24 ENCOUNTER — Encounter: Payer: Self-pay | Admitting: Internal Medicine

## 2010-07-28 ENCOUNTER — Encounter: Payer: Self-pay | Admitting: Internal Medicine

## 2010-07-28 ENCOUNTER — Ambulatory Visit (INDEPENDENT_AMBULATORY_CARE_PROVIDER_SITE_OTHER): Payer: Medicare Other | Admitting: Internal Medicine

## 2010-07-28 VITALS — BP 132/74 | HR 53 | Ht 63.0 in | Wt 212.6 lb

## 2010-07-28 DIAGNOSIS — J984 Other disorders of lung: Secondary | ICD-10-CM

## 2010-07-28 DIAGNOSIS — H6980 Other specified disorders of Eustachian tube, unspecified ear: Secondary | ICD-10-CM

## 2010-07-28 DIAGNOSIS — G4733 Obstructive sleep apnea (adult) (pediatric): Secondary | ICD-10-CM

## 2010-07-28 DIAGNOSIS — J309 Allergic rhinitis, unspecified: Secondary | ICD-10-CM

## 2010-07-28 NOTE — Assessment & Plan Note (Addendum)
We will update CT with discussion of usual 2-year f/u recommendation.

## 2010-07-28 NOTE — Patient Instructions (Signed)
Order-   Schedule CT chest with contrast dye for dx lung nodules

## 2010-07-28 NOTE — Progress Notes (Signed)
  Subjective:    Patient ID: [name], female    DOB: [birth year], 65 y.o.   MRN: [id number]  HPI 07/28/10- 65 yoF followed for sleep apnea, allergic rhinitis, hx of lung nodule. Last here March 8- reviewed.  She continues CPAP 13.  Good compliance and control. She changed to nasal pillows mask- Advanced- likes this mask better. She liked sample Nasonex but didn't need script.  Notes occasional mild wheeze and some chest tightness- questions if she might have a little asthma- never diagnosed. It doesn't restrict activity. CTa 11/27/09- showed bilat lower lobe nodules unchanged from 09/29/09 w/ recommendation for recheck in 6 months.  Review of Systems See HPI Constitutional:   No weight loss, night sweats,  Fevers, chills, fatigue, lassitude. HEENT:   No headaches,  Difficulty swallowing,  Tooth/dental problems,  Sore throat,                No-, itching, ear ache, nasal congestion, post nasal drip,   CV:  No chest pain,  Orthopnea, PND, swelling in lower extremities, anasarca, dizziness, palpitations. Easy diaphoresis with exertion vacuum.  GI  No heartburn, indigestion, abdominal pain, nausea, vomiting, diarrhea, change in bowel habits, loss of appetite  Resp: No acute shortness of breath with exertion or at rest.  No excess mucus, no productive cough,  No non-productive cough,  No coughing up of blood.  No change in color of mucus. .    Skin: no rash or lesions.  GU: no dysuria, change in color of urine, no urgency or frequency.  No flank pain.  MS:  No joint pain or swelling.  No decreased range of motion.  No back pain.  Psych:  No change in mood or affect. No depression or anxiety.  No memory loss.      Objective:   Physical Exam General- Alert, Oriented, Affect-appropriate, Distress- none acute  Skin- rash-none, lesions- none, excoriation- none  Lymphadenopathy- none  Head- atraumatic  Eyes- Gross vision intact, PERRLA, conjunctivae clear secretions  Ears-  Normal-  Hearing, canals  Nose- Clear, No-Septal dev, mucus, polyps, erosion, perforation   Throat- Mallampati II-III , mucosa clear , drainage- none, tonsils- atrophic  Neck- flexible , trachea midline, no stridor , thyroid nl, carotid no bruit  Chest - symmetrical excursion , unlabored     Heart/CV- RRR , no murmur , no gallop  , no rub, nl s1 s2                     - JVD- none , edema- none, stasis changes- none, varices- none     Lung- clear to P&A, wheeze- none, cough- none , dullness-none, rub- none     Chest wall-   Abd- tender-no, distended-no, bowel sounds-present, HSM- no  Br/ Gen/ Rectal- Not done, not indicated  Extrem- cyanosis- none, clubbing, none, atrophy- none, strength- nl  Neuro- grossly intact to observation         Assessment & Plan:

## 2010-07-28 NOTE — Assessment & Plan Note (Signed)
Good compliance and control on CPAP. No change indicated now.

## 2010-07-30 ENCOUNTER — Ambulatory Visit (INDEPENDENT_AMBULATORY_CARE_PROVIDER_SITE_OTHER)
Admission: RE | Admit: 2010-07-30 | Discharge: 2010-07-30 | Disposition: A | Discharge status: Home / Self Care | Payer: Medicare Other | Source: Ambulatory Visit | Attending: Internal Medicine | Admitting: Internal Medicine

## 2010-07-30 DIAGNOSIS — J984 Other disorders of lung: Secondary | ICD-10-CM

## 2010-07-30 MED ORDER — IOHEXOL 300 MG/ML  SOLN
100.0000 mL | Freq: Once | INTRAMUSCULAR | Status: AC | PRN
  Administered 2010-07-30: 100 mL via INTRAVENOUS

## 2010-08-02 ENCOUNTER — Encounter: Payer: Self-pay | Admitting: Internal Medicine

## 2010-08-02 NOTE — Assessment & Plan Note (Signed)
Reviewed available options. She is past her worst season for this year.

## 2010-08-02 NOTE — Assessment & Plan Note (Signed)
Currently well controlled despite some rhinitis in March.

## 2010-08-04 NOTE — Procedures (Signed)
NAMEKRYSTALYNN, [name]             ACCOUNT NO.:  [id number]   MEDICAL RECORD NO.:  [id number]          PATIENT TYPE:  OUT   LOCATION:  SLEEP CENTER                 FACILITY[facility]  [facility]   PHYSICIAN:  Clinton D. Maple Hudson, MD, FCCP, FACPDATE OF BIRTH:  10/09/46   DATE OF STUDY:                            NOCTURNAL POLYSOMNOGRAM   REFERRING PHYSICIAN:  Clinton D. Young, MD, FCCP, FACP   INDICATION FOR STUDY:  Hypersomnia with sleep apnea.   EPWORTH SLEEPINESS SCORE:  5/24.  BMI 33.  Weight 195 pounds.  Height 64  inches.  Neck 14.5 inches.   MEDICATIONS:  Home medications listed and reviewed.   SLEEP ARCHITECTURE:  Split study protocol.  During the diagnostic phase,  total sleep time was 123 minutes with sleep efficiency 99.6%.  Stage I  was 0.8%.  Stage II 99.2%.  Stages III and REM were absent.  Sleep  latency zero.  Alprazolam 0.5 mg was taken at 9:15 p.m.   RESPIRATORY DATA:  Split study protocol.  Apnea hypopnea index (AHI)  31.7 obstructive events per hour, indicating moderate obstructive sleep  apnea/hypopnea syndrome before CPAP.  This reflected a total of 65  obstructive events before CPAP, all hypopneas and all associated with  supine sleep position.  CPAP was titrated to 14 CWP, AHI zero per hour.  A small Mirage Quattro mask was used with heated humidifier.   OXYGEN DATA:  Moderate snoring before CPAP with oxygen desaturation to a  nadir of 85%.  Mean oxygen saturation before CPAP was 93.6% on room air.  After CPAP titration, oxygen saturation held 94.7% on room air.   CARDIAC DATA:  Sinus rhythm.   MOVEMENT-PARASOMNIA:  No significant movement disturbance.  Bathroom  times one.   IMPRESSIONS-RECOMMENDATIONS:  1. Moderate obstructive sleep apnea/hypopnea syndrome, AHI 31.7      obstructive events per hour associated with supine sleep position.      Moderate snoring with oxygen desaturation to a nadir of 85%.  2. Successful CPAP titration to 14 CWP, AHI zero per hour.  A  small      Mirage Quattro mask was used with heated humidifier.      Clinton D. Maple Hudson, MD, [facility], FACP  Diplomate, Biomedical engineer of Sleep Medicine  Electronically Signed     CDY/MEDQ  D:  03/05/2007 10:03:32  T:  03/06/2007 10:30:14  Job:  [id number]

## 2010-08-04 NOTE — Assessment & Plan Note (Signed)
[facility]                             PULMONARY OFFICE NOTE   NAME:[name], [name]                  MRN:          [id number]  DATE:02/17/2007                            DOB:          09/15/1945    PROBLEM:  66 year old woman asks evaluation for suspected sleep apnea.   HISTORY:  Family is complaining of her snoring and daytime sleepiness,  describing witnessed apnea's.  Patient recognizes unrefreshed sleep and  a desire to take naps during the day.  Bedtime is around 10:30 p.m.,  wakening about 4 a.m.  She generally gets up then because if she lies in  bed she says fibromyalgia symptoms will get worse.  She awakes briefly 2-  3 times during the night. Occasional naps are somewhat helpful.  Fibromyalgia makes her positioning in bed somewhat uncomfortable.   MEDICATIONS:  1. Lisinopril 20 mg.  2. Levoxyl 0.025 mg.  3. Lexapro 20 mg b.i.d.  4. Wellbutrin XL 150 mg.  5. Boniva 150 mg.  6. Alprazolam 0.5 mg h.s. p.r.n.  7. Hydrocodone 5/500 p.r.n.   ALLERGIES:  No medication allergy.   REVIEW OF SYSTEMS:  Drowsiness when inactive and other related  complaints as per HPI.  She thinks her weight is about stable.  Sometimes aware of a leg jerk or two at night.  Some cough sometimes at  night but she does not recognize reflux or heart burn symptoms.  Depression.  No chest pain or palpitations, nausea or vomiting, routine  cough or wheeze.   PAST HISTORY:  1. Fibromyalgia followed by Dr. Kathryne Hitch.  2. Tonsillectomy.  3. Caldwell- Luc sinus surgery in the 1970's with septoplasty.  4. Hypothyroidism treated.  5. A nasal septal perforation more recently has been treated with a      plug by Dr. Suszanne Conners and has helped her breathing.  6. Hypertension.  7. Elevated cholesterol.  8. Allergic rhinitis.  9. Chronic headaches.  10.Arthritis.   SURGERIES:  Cholecystectomy; sinus surgery; hysterectomy; tubal  ligation.   SOCIAL HISTORY:  No  alcohol, no tobacco. 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  [facility] but [facility] not  [facility] so.   FAMILY HISTORY:  Daughter treated here with obstructive sleep apnea on  CPAP.  Father with COPD. Daughter with asthma. Father with heart  disease. Mother with arthritis.  Both parents have had cancer's.   OBJECTIVE:  VITAL SIGNS:  Weight 198 pounds, blood pressure 128/80,  pulse 89, room air saturation 96%.  GENERAL:  Cheerful, pleasant woman. Body build is obese.  NEUROLOGIC EXAM:  Unremarkable to observation.  HEENT:  No stridor. Palate is basic 2/4, voice quality normal, nasal  airway not obstructed, an opaque septal plug is seen.  HEART:  Regular without murmur or gallop.  CHEST:  Lung fields are clear.   IMPRESSION:  Probable obstructive sleep apnea.   PLAN:  We have discussed sleep apnea, the medical concerns and basic  work-up.  We have reviewed good sleep hygiene. Schedule split protocol  NPSG,  to return for office followup after that is completed.  Clinton D. Maple Hudson, MD, [facility], FACP  Electronically Signed    CDY/MedQ  DD: 02/18/2007  DT: 02/18/2007  Job #: [id number]   cc:   Geoffry Paradise, M.D.  [facility].

## 2010-08-04 NOTE — Cardiovascular Report (Signed)
NAMEAVYNN, KLASSEN             ACCOUNT NO.:  [id number]   MEDICAL RECORD NO.:  [id number]          PATIENT TYPE:  INP   LOCATION:  3730                         FACILITY:  [facility]   PHYSICIAN:  Nanetta Batty, M.D.   DATE OF BIRTH:  27-Nov-1945   DATE OF PROCEDURE:  DATE OF DISCHARGE:                            CARDIAC CATHETERIZATION   [name] is a 65 year old moderately overweight, married Caucasian  female with positive cardiac risk factors including hypertension and  dyslipidemia who was admitted yesterday with unstable angina.  She ruled  out for myocardial infarction.  Her EKG was nondiagnostic without acute  ST-T wave changes.  Also, she did have inferolateral Q waves.  She  presents now for diagnostic coronary angiography to define her anatomy  rule out ischemic etiology.   DESCRIPTION OF PROCEDURE:  The patient was brought to the second floor  [facility] in the postabsorptive state.  She was  premedicated with p.o. Valium, IV Versed, and fentanyl.  Her right groin  was prepped, shaved in usual sterile fashion.  Xylocaine 1% was used for  local anesthesia.  A 6-French sheath was inserted into the right femoral  artery using standard Seldinger technique.  The 6-French right and left  Judkins diagnostic catheter as well as 6-French pigtail catheter were  used for selective cholangiography, left ventriculography, and distal  abdominal aortography.  Visipaque dye was used entirety of the case.  Visipaque dye was used entirety of the case.  Retrograde aortic, left  ventricular, and pullback pressures were recorded.   HEMODYNAMIC RESULTS:  1. Aortic systolic pressure 164, diastolic pressure to 161, and      diastolic pressure 94.  2. Left ventricle systolic pressure 160, end-diastolic pressure 17.   SELECTIVE CHOLANGIOGRAPHY:  1. Left main normal.  2. LAD normal.  3. Left circumflex normal.  4. Ramus branch was small and normal.  5. Right coronary  artery is dominant and normal.  6. Left ventriculography; RAO left ventriculogram was performed using      25 mL of Visipaque dye at 12 mL per second.  The overall LVEF was      estimated greater than 60% without focal wall motion abnormalities.  7. Distal abdominal aortography; distal abdominal aortogram was      performed using 25 mL of Visipaque dye at 20 mL per second.  The      renal arteries revealed mild bilateral fibromuscular dysplastic      changes the midportion of both renal arteries. The infrarenal      abdominal aorta and the iliac bifurcation were free of significant      atherosclerotic changes.   IMPRESSION:  [name] has normal coronary arteries, normal left  ventricular function with mild renal artery fibromuscular dysplasia.  I  am not sure the etiology of her shortness of breath, chest pain.  She  will be renal Doppler studies in our office to further characterize a  physiologic significance for fibromuscular dysplasia.   The sheath was removed and pressure was held in the groin to achieve  hemostasis.  The patient left the lab in  stable condition.  She will be  gently hydrated, discharged home in the morning and we will see her back  in the office in 1-2 weeks for followup.      Nanetta Batty, M.D.  Electronically Signed     JB/MEDQ  D:  08/12/2008  T:  08/13/2008  Job:  [id number]

## 2010-08-04 NOTE — Op Note (Signed)
[name], [name]             ACCOUNT NO.:  [id number]   MEDICAL RECORD NO.:  [id number]          PATIENT TYPE:  OIB   LOCATION:  5151                         FACILITY:  [facility]   PHYSICIAN:  Newman Pies, MD            DATE OF BIRTH:  Jun 12, 1945   DATE OF PROCEDURE:  04/29/2008  DATE OF DISCHARGE:                               OPERATIVE REPORT   SURGEON:  Newman Pies, MD   PREOPERATIVE DIAGNOSES:  1. Nasal septal perforation.  2. Chronic rhinitis.  3. Bilateral inferior turbinate hypertrophy.   POSTOPERATIVE DIAGNOSES:  1. Nasal septal perforation.  2. Chronic rhinitis.  3. Bilateral inferior turbinate hypertrophy.   PROCEDURES PERFORMED:  1. Nasal septal perforation repair with auricular cartilage.  2. Left auricular cartilage harvesting.  3. Bilateral partial inferior turbinate resection.   ANESTHESIA:  General endotracheal tube anesthesia.   COMPLICATIONS:  None.   ESTIMATED BLOOD LOSS:  Less than 20 mL.   INDICATIONS FOR PROCEDURE:  The patient is a 65 year old female with a  history of nasal septal perforation, resulting in chronic whistling,  crusting, and bleeding.  In addition, the patient has a history of  chronic rhinitis and chronic nasal obstruction.  She was noted to have  significant bilateral inferior turbinate hypertrophy.  She was  previously treated with conservative medical treatments with nasal  steroid spray, nasal saline irrigation, decongestant, and antihistamine.  However, she continues to be symptomatic.  Based on the above findings,  the decision was made for the patient to undergo surgical repair of her  nasal septal perforation and bilateral partial inferior turbinate  resection.  The risks, benefits, alternatives, and details of the  procedures were discussed with the patient.  Questions were invited and  answered.  Informed consent was obtained.   DESCRIPTION OF PROCEDURE:  The patient was taken to the operating room  and placed supine on the  operating table.  General endotracheal tube  anesthesia was administered by the anesthesiologist.  Preop IV  antibiotic and Decadron were given.  The patient was positioned and  prepped and draped in a standard fashion for nasal surgery with left  auricular cartilage harvesting.  Lidocaine 1% with 1:100,000 epinephrine  were injected into the left auricle and the nasal septum.  Attention was  first focused on the left ear.  An incision was made along the edge of  the antihelix.  The left auricular cartilage was harvested from the  conchal bowl in the standard fashion.  The perichondrium was left  attached to the cartilage.  An approximately 1.5 x 1.5 cm of auricular  cartilage was obtained.  The auricular incision was closed with 4-0  chromic sutures.  Through-and-through quilting stitches were also  applied.   Attention was then focused on the nose.  Examination of the nasal  cavities revealed a 1.5 cm anterior nasal septal perforation.  A  standard hemitransfixion incision was made via the left nostril.  The  mucosal flap was elevated from the nasal septum bilaterally in a  standard fashion.  The use of the septal perforation was measured  to be  1.5 cm in diameter.  However, the cartilage defect was approximately 2  cm in size.  The previously harvested auricular cartilage was then  morselized and placed onto the nasal septum.  It was sutured in place  with 4-0 Monocryl sutures.  A relaxing incision was then made superiorly  at the superior nasal septum and laterally beneath the inferior  turbinate.  The incisions were made bilaterally.  The bipedicle nasal  mucosal flap was then approximated in the middle, to completely close  the septal perforation.  The same procedure was repeated on both sides.  Through-and-through floating sutures were then made with 5-0 plain gut  sutures.  The hemitransfixion incision was closed with 4-0 chromic  sutures.  Attention was then focused on the  inferior turbinates.  Both  inferior turbinates were noted to be significantly hypertrophied.  The  inferior one-half of each inferior turbinates were clamped with a  straight Kelly clamp.  They were then resected with a pair of cross  cutting scissors.  Hemostasis was achieved with suction electrocautery.  Doyle incisions were then applied to the nasal septum bilaterally.  That  concluded procedure for the patient.  The care of the patient was turned  over to the anesthesiologist.  The patient was awakened from anesthesia  without difficulty.  She was extubated and transferred to the recovery  room in good condition.   OPERATIVE FINDINGS:  1. A 1.5 cm anterior nasal septal perforation was noted.  2. Bilateral inferior turbinate hypertrophy.   SPECIMENS REMOVED:  None.   FOLLOWUP CARE:  The patient will be observed overnight due to her  obstructive sleep apnea.  The patient will be discharged home on postop  day #1.  She will be placed on amoxicillin 500 mg p.o. b.i.d. for 10  days, and Vicodin 1-2 tablets p.o. q.4-6 h p.r.n. pain.  She will  followup in my office in 1 week.      Newman Pies, MD  Electronically Signed     ST/MEDQ  D:  04/29/2008  T:  04/29/2008  Job:  [id number]

## 2010-08-04 NOTE — H&P (Signed)
[name], [name]             ACCOUNT NO.:  [id number]   MEDICAL RECORD NO.:  [id number]         PATIENT TYPE:  LINP   LOCATION:                               FACILITY:  [facility]   PHYSICIAN:  Madlyn Frankel. Charlann Boxer, M.D.  DATE OF BIRTH:  Jan 01, 1946   DATE OF ADMISSION:  07/17/2007  DATE OF DISCHARGE:                              HISTORY & PHYSICAL   REASON FOR ADMISSION:  Open reduction, internal fixation of a right  tibial plateau fracture with plate fixation and bone graft.   CHIEF COMPLAINTS:  Right knee pain.   HISTORY OF PRESENT ILLNESS:  A 65 year old female who fell off a ladder  approximately 6 days ago.  She originally presented to the [facility]  emergency department, where she was discovered to have a right wrist  fracture.  Subsequently, followed up with Dr. Lestine Box.  Her x-rays  revealed a distal right wrist fracture, as well as a right knee tibial  plateau fracture, displaced 6 mm.  Subsequent care for the right tibial  plateau was transferred to Dr. Charlann Boxer for definitive surgical management.  No other recent health changes noted.   PAST MEDICAL HISTORY:  1. Osteoarthritis.  2. Hyperthyroid.  3. Hypertension.  4. Migraine headaches.  5. Anxiety and depression.   PAST SURGICAL HISTORY:  1. Cholecystectomy.  2. Hysterectomy.   FAMILY HISTORY:  Heart disease, diabetes, hypertension, cancer, stroke,  arthritis.   SOCIAL HISTORY:  The patient is married.  Primary caregiver after  surgery will be [employer], I guess her husband and her daughter,  [name].  She does have a few stairs in her house, but not many.   DRUG ALLERGIES:  DILAUDID.   HOME MEDICATIONS:  Lisinopril, Lexapro, Levoxyl, oxycodone, Boniva,  amoxicillin, Nasonex, alprazolam.  Please verify dosage and frequency  with the patient at the time of admission.   REVIEW OF SYSTEMS:  HEENT:  Recent sinus infection, was prescribed some  amoxicillin and feels much better.  Otherwise see HPI.   PHYSICAL EXAMINATION:  VITAL SIGNS:  Pulse 64, respirations 18, blood  pressure 118/64.  GENERAL:  Awake, alert and oriented, well-developed, well-nourished in a  wheelchair.  Neck:  Supple.  No carotid bruits.  CHEST/LUNGS:  Clear to auscultation bilaterally.  BREASTS:  Deferred.  HEART:  Regular rate and rhythm.  S1-S2 distinct.  ABDOMEN:  Soft, nontender, nondistended.  Bowel sounds present.  GENITOURINARY:  Deferred.  EXTREMITIES:  Her right wrist is in a cast with brisk capillary refill.  Right lower extremity has brisk capillary refill.  SKIN:  No cellulitis right lower extremity.  No breakdown.  NEUROLOGIC:  She has intact distal sensibilities right lower extremity.   LABORATORY DATA:  EKG, chest x-ray, all pending for surgical testing.   IMPRESSION:  1. Right tibial plateau fracture with posterior displacement of 6 mm.  2. Right distal wrist fracture.   PLAN OF ACTION:  Open reduction, internal fixation of right tibial  plateau fracture with plate fixation and bone graft by surgeon, Dr.  Durene Romans.  The risks and complications were discussed.  Questions  were encouraged, answered and reviewed.  Consent was obtained based upon  our conversation.     ______________________________  Yetta Glassman [address], [place]      Madlyn Frankel. Charlann Boxer, M.D.  Electronically Signed    BLM/MEDQ  D:  07/14/2007  T:  07/14/2007  Job:  [id number]

## 2010-08-04 NOTE — Op Note (Signed)
[name], [name]             ACCOUNT NO.:  [id number]   MEDICAL RECORD NO.:  [id number]          PATIENT TYPE:  INP   LOCATION:  0009                         FACILITY:  [facility]   PHYSICIAN:  Madlyn Frankel. Charlann Boxer, M.D.  DATE OF BIRTH:  Mar 02, 1946   DATE OF PROCEDURE:  07/17/2007  DATE OF DISCHARGE:                               OPERATIVE REPORT   PREOPERATIVE DIAGNOSIS:  Right posterior lateral tibial plateau fracture  with compression.   POSTOPERATIVE DIAGNOSIS:  Right posterior lateral tibial plateau  fracture with compression.   PROCEDURE:  Open reduction and internal fixation of right posterior  lateral tibial plateau fracture, augmenting this repair with allograft  bone chips and three 4.5 mm cannulated screws.   SURGEON:  Madlyn Frankel. Charlann Boxer, M.D.   ASSISTANT:  Yetta Glassman. Mann, PA   ANESTHESIA:  General.   BLOOD LOSS:  Minimal.   TOURNIQUET TIME:  60 minutes at 250 mmHg.   DRAINS:  None.   COMPLICATIONS:  None.   INDICATIONS FOR THE PROCEDURE:  [name] is a 65 year old female  with a history of fall injuring her right wrist, nondisplaced distal  radius fracture.  She had also had findings for a posterior lateral  tibial plateau fracture.  It was confirmed and evaluated further by CT  scan.  We discussed with her the nature of this step-off that was 7-8 mm  and the fact that it ought to be attempted to be slipped back into its  normal anatomic position with support.   We reviewed the risks and benefits of infection, DVT, the chance of  failure of the fixation in addition to the potential for degenerative  changes.  Risks and benefits were discussed and consent obtained.   PROCEDURE IN DETAIL:  The patient was brought to the operative theater.  Once adequate anesthesia and preoperative antibiotics administered, the  patient was positioned supine.  Proximal thigh tourniquet used,  prescrubbed and prepped and draped the right lower extremity.  Under  fluoroscopic  imaging, landmarks identified, identifying the location for  the incision.  Leg was then exsanguinated and tourniquet elevated to 250  mmHg.  I made an L incision over the joint line laterally and then  distally along the tibial spine.  The lateral anterior muscular  compartment was then elevated off the bone with subperiosteal  dissection.   Again, under fluoroscopic imaging, I identified the landmarks.  I used a  small drill bit to create a rectangular window into the bone in the  proximal metaphysis laterally.  Following this, I identified my  landmarks and subtotally began using a bone tamp to elevate the  posterior lateral fracture.  Under fluoroscopic imaging, once I was  satisfied that I had this in a near anatomic position, I back-filled  this area with approximately 20 mL of allograft bone chips.  With the  support of the bone graft in place I then placed three 4.5 cannulated  screws from anterior to posterior in the lateral compartment of the leg.  I first used a drill bit, measured the depth, and drilled the outer  cortex.  Following placement of the screws, I confirmed the reduction and  maintenance of reduction by AP and lateral films, irrigated the wound.  I packed a few more chips into the lateral aspect of the proximal tibia  and then placed the cortical bone over the top of it.   No Hemovac drain was utilized.  I let down the tourniquet at 60 minutes.  I reapproximated the anterior lateral muscular compartment with a #1  Vicryl and then the subcu layer in 2-0 Vicryl and running 4-0 Monocryl  on the L incision.  The knee was cleaned, dried and dressed sterilely  with Steri-Strips and a bulky sterile dressing.  She was brought to the  recovery room in stable condition with soft compartments.      Madlyn Frankel Charlann Boxer, M.D.  Electronically Signed     MDO/MEDQ  D:  07/17/2007  T:  07/17/2007  Job:  [id number]

## 2010-08-04 NOTE — Discharge Summary (Signed)
[name], Hannah             ACCOUNT NO.:  [id number]   MEDICAL RECORD NO.:  [id number]          PATIENT TYPE:  INP   LOCATION:  3730                         FACILITY:  [facility]   PHYSICIAN:  Nanetta Batty, M.D.   DATE OF BIRTH:  1945-09-26   DATE OF ADMISSION:  08/11/2008  DATE OF DISCHARGE:  08/13/2008                               DISCHARGE SUMMARY   ADDENDUM:  [name] has actually had a sleep study about 3 years ago  at [facility].  She does have sleep apnea and was on CPAP.  She had  sinus surgery for what sounds like polyps in February 2010 and has not  been using her CPAP since.  She also feels like she is having some  trouble with Remeron, but I deferred that to her primary care doctor.  We will reevaluate her sleep apnea after her sinus surgery.      Abelino Derrick, P.A.      Nanetta Batty, M.D.  Electronically Signed    LKK/MEDQ  D:  08/13/2008  T:  08/14/2008  Job:  [id number]

## 2010-08-04 NOTE — Discharge Summary (Signed)
[name], [name]             ACCOUNT NO.:  [id number]   MEDICAL RECORD NO.:  [id number]          PATIENT TYPE:  INP   LOCATION:  3730                         FACILITY:  [facility]   PHYSICIAN:  Nanetta Batty, M.D.   DATE OF BIRTH:  09/01/1945   DATE OF ADMISSION:  08/11/2008  DATE OF DISCHARGE:  08/13/2008                               DISCHARGE SUMMARY   DISCHARGE DIAGNOSES:  1. Chest pain worrisome for unstable angina, essentially normal      coronaries at catheterization this admission.  2. Treated hypertension.  3. 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  4. Obesity.  5. Treated hypothyroidism.  6. Hypertension.   HOSPITAL COURSE:  The patient is a 65 year old female who was awakened  from sleep with midsternal chest pain worrisome for unstable angina.  She was admitted on Aug 11, 2008.  Initial enzymes were negative.  She  was started on Lovenox and set up for diagnostic catheterization.  Enzymes were negative.  Catheterization on Aug 12, 2008, revealed normal  coronaries and normal LV function.  She did have fibromuscular dysplasia  in her renal arteries.  On Aug 13, 2008, her blood pressure was still  somewhat elevated and we added HCTZ to her medicines as well as a beta-  blocker.  She will need an outpatient sleep study and renal artery  Dopplers.  She will see Dr. Allyson Sabal in followup.  She was seen by  nutrition consult prior to discharge for metabolic syndrome.   DISCHARGE MEDICATIONS:  1. Simvastatin 20 mg a day.  2. Prilosec 20 mg a day.  3. Lisinopril/HCTZ 20/12.5 daily.  4. Synthroid 0.025 mg a day.  5. Remeron 45 mg at bedtime.  6. Metoprolol 25 mg twice a day.   White count 9.6, hemoglobin 14.4, hematocrit 41.8.  Lipid panel shows  cholesterol 224, LDL 153.  Troponins are negative.  Hemoglobin A1c is  6.8.  TSH 2.9.  EKG shows sinus rhythm without acute changes.   DISPOSITION:  The patient is discharged in stable condition and will  follow  up with Dr. Allyson Sabal after the above test.     Abelino Derrick, P.A.      Nanetta Batty, M.D.  Electronically Signed   LKK/MEDQ  D:  08/13/2008  T:  08/14/2008  Job:  [id number]

## 2010-08-07 ENCOUNTER — Telehealth: Payer: Self-pay | Admitting: Internal Medicine

## 2010-08-07 NOTE — Discharge Summary (Signed)
[facility]  Patient:    [name], [name]                  MRN: [id number] Adm. Date:  [id number] Disc. Date: [id number] Attending:  Cordelia Pen Ii                           Discharge Summary  ADMITTING DIAGNOSIS:  Cystocele, rectocele.  DISCHARGE DIAGNOSIS:  Cystocele, rectocele.  PROCEDURES ON September 20, 2000:  Anterior-posterior vaginal repair with a return to the operating room for a ligation of a postoperative bleeder.  REASON FOR ADMISSION:  The patient is a 65 year old married, white female, G2, P2, status post total vaginal hysterectomy with symptomatic pelvic prolapse. Details are dictated in the history and physical. The patient is admitted for a surgical correction.  HOSPITAL COURSE:   The patient was admitted to the hospital and went to the operating room for an anterior-posterior vaginal repair. Estimated blood loss was 250 cc. The procedure went well. However, essentially immediately upon arrival to the recovery room, she was noted to have vaginal bleeding which was heavier than normal. She therefore was returned to the operating room within approximately 30 minutes of leaving it, at which time she was found to have a bleeder beneath the posterior vaginal mucosa which was ligated. Packing was placed and on the evening of surgery, the patient was noted to be doing well with good pain relief and a good urine output. She remained afebrile. On the first postoperative day, the vaginal pack was removed and again, no excessive bleeding was noted. She did have trouble with itching on both Dilaudid and morphine PCAs. She was changed to a Demerol PCA. Hemoglobin on September 21, 2000 is 9.4 and white count is 7.7. On the day of discharge, she is voiding well with residual urines between 0 and 75 cc. She remains afebrile. Suprapubic catheter is removed intact.  CONDITION ON DISCHARGE:  Good.  DIET:  Regular as tolerated.  ACTIVITY:  No  lifting, no operation of automobiles, no vaginal entry.  DISCHARGE INSTRUCTIONS:  She is to call the office for problems including, but not limited to, temperature of 101, increasing pain, heavy vaginal bleeding or persistent nausea or vomiting. She was instructed on double voiding.  DISCHARGE MEDICATIONS: 1. Ibuprofen 600 mg p.o. q.6h. p.r.n. 2. Darvocet-N 100, #15, one to two p.o. q.6h. p.r.n. 3. Multivitamin daily. 4. Chromagen Forte, #30, one refill, one p.o. q.d.  DISCHARGE FOLLOWUP:  The patient is to follow up in the office in two weeks. DD:  09/22/00 TD:  09/22/00 Job: [id number]

## 2010-08-07 NOTE — Discharge Summary (Signed)
[name], [name]             ACCOUNT NO.:  [id number]   MEDICAL RECORD NO.:  [id number]          PATIENT TYPE:  INP   LOCATION:  1504                         FACILITY:  [facility]   PHYSICIAN:  Geoffry Paradise, M.D.  DATE OF BIRTH:  02/19/1946   DATE OF ADMISSION:  01/16/2006  DATE OF DISCHARGE:  01/21/2006                                 DISCHARGE SUMMARY   DIAGNOSES AT THE TIME OF DISCHARGE:  1. Right upper extremity cellulitis, status post cat bite.  2. Hypothyroidism.  3. Essential hypertension.  4. Fibromyalgia syndrome.   HISTORY OF PRESENT ILLNESS:  [name] is a very pleasant 65 year old  well-known to myself with medical history including hypothyroidism,  hypertension, and fibromyalgia, presenting at this time with 48 hours of  redness, swelling and pain involving the right arm despite outpatient  Augmentin therapy, status post a cat bite.  She is normally in excellent  health and does come for routine health care.  She is usually quite active  and relates the inciting event was a cat bite on her right forearm as she  was trying to break up evidently a fight between to cats.  Initially she had  no significant symptomatology, but over the ensuing hours she developed  pain, swelling and redness involving this resulting in presentation to an  urgent medical center.  She was placed on Augmentin and had at least 2-3  doses but symptoms progressed, leading to presentation to the emergency room  for admission for treatment of a cellulitis resistant to outpatient  antibiotics.  For further details, see the dictated summary by myself  January 17, 2006.   DATA:  Vancomycin trough was 12.9.  CBC:  Hemoglobin 13.7, hematocrit 39,  white blood count 9.6, platelet count to 278,000.  Chemistry:  Sodium 136,  potassium 4.1, chloride 101, CO2 25, glucose 123, BUN 10, creatinine 0.7,  calcium 9.2, protein 6.4, albumin 3.5, SGOT 15, SGPT 22, alkaline  phosphatase 63, bilirubin 0.5.   Blood cultures x2 were negative.   HOSPITAL COURSE:  The patient was admitted, placed initially on high-dose  Unasyn.  After little to no improvement for the first 24-48 hours,  vancomycin was added to the regimen.  Dr. Lajoyce Corners of orthopedic surgery was  consulted, and his feeling that was there was no indication for any surgical  intervention or MRI at this point as there is no evidence for abscess  formation or compartment syndrome as yet.  Distal neurovascular the right  hand did remain intact and gradually but significantly, she got steady  improvement from the IV therapy to the extent that on the date of discharge  she has no residual swelling, warmth or tenderness, and she has for range of  motion both about the wrist and fingers with intact pulses.  She is  discharged in improved and stable condition to resume and complete her  course of Augmentin that she had started as an outpatient.  This should  provide adequate coverage now that she had significant IV therapy to change  the course of this cellulitis.  She is to resume her prior home  medications  and diet.  She is to call as needed for any issues.           ______________________________  Geoffry Paradise, M.D.    RA/MEDQ  D:  01/21/2006  T:  01/21/2006  Job:  [id number]

## 2010-08-07 NOTE — Op Note (Signed)
NAMEDOYLE, TEGETHOFF                     ACCOUNT NO.:  [id number]   MEDICAL RECORD NO.:  [id number]                   PATIENT TYPE:  AMB   LOCATION:  DAY                                  FACILITY:  [facility]   PHYSICIAN:  Sharlet Salina T. Hoxworth, M.D.          DATE OF BIRTH:  25-Mar-1945   DATE OF PROCEDURE:  01/17/2003  DATE OF DISCHARGE:                                 OPERATIVE REPORT   PREOPERATIVE DIAGNOSES:  1. Cholelithiasis.  2. Cholecystitis.   POSTOPERATIVE DIAGNOSES:  1. Cholelithiasis.  2. Cholecystitis.   PROCEDURES:  1. Laparoscopic cholecystectomy.  2. Intraoperative cholangiogram.   SURGEON:  Sharlet Salina T. Hoxworth, M.D.   ASSISTANTAngelia Mould. Derrell Lolling, M.D.   ANESTHESIA:  General.   BRIEF HISTORY:  Ms. Bochenek is a 65 year old white female who presents with  frequent episodes of epigastric and right upper quadrant abdominal pain  following meals.  She had a workup, including gallbladder ultrasound showing  multiple gallstones.  Laparoscopic cholecystectomy with cholangiogram has  been recommended and accepted.  The nature of the procedure, indications;  risks of bleeding, infection were discussed and understood preoperatively.  She is now brought to the operating room for the above procedure.   DESCRIPTION OF PROCEDURE:  The patient was brought to the operating room and  placed in the supine position on the operating room table.  General  endotracheal anesthesia was induced.  She received preoperative antibiotics.  PAS were placed.  The abdomen was sterilely prepped and draped.  Local  anesthesia was used to infiltrate the trocar sites prior to the incisions.  Note that the Hasson technique was used at the umbilicus through a mattress  suture of #0 Vicryl.   Pneumoperitoneum was established.  Under direct vision a 10 mm trocar was  placed in the subxiphoid area, and two 5 mm trocars were placed in the right  subcostal margin.  The gallbladder was exposed  and appeared chronically  inflamed.  The fundus was grasped and elevated off of the liver.  There were  adhesions of the omentum and duodenum; they were carefully taken down and  the infundibulum retracted inferolaterally.  Peritoneum anterior and  posterior to Calot's triangle was incised and fibrous fatty tissue stripped  off the neck of the gallbladder.  The rest of the gallbladder was thoroughly  inspected and the cystic duct identified and dissected over about 1 cm at  the cystic duct/gallbladder junction.  Dissected 360 degrees.  When the  anatomy was cleared the cystic duct was clipped at the gallbladder junction.   Operative cholangiogram obtained through the cystic duct.  This showed good  filling of a borderline enlarged common bile duct, with free flow into the  duodenum; no filling defects and good flow up into the hepatic ducts.  Following this, the cholangiocatheter was removed and the cystic duct triply  clipped proximally and divided.  The cystic artery was divided between the  proximal and distal  clip near the gallbladder.  The gallbladder was then  dissected free from its bed, using cautery; removed intact through the  umbilicus.   The right upper quadrant was irrigated and complete hemostasis assured.  Trocars were removed under direct vision.  All CO2 evacuated.  Mattress  sutures were secured at the umbilicus.  The skin was closed with a  subcuticular 4-0 Monocryl and Steri-Strips.  Sponge, needle and instrument  counts were correct.  Dry sterile dressings were applied.   The patient was taken to the recovery room in good condition.                                               Lorne Skeens. Hoxworth, M.D.    Tory Emerald  D:  01/17/2003  T:  01/17/2003  Job:  [id number]

## 2010-08-07 NOTE — Telephone Encounter (Signed)
Pls advise ct chest results, thanks!

## 2010-08-07 NOTE — Op Note (Signed)
[facility]  Patient:    [name], [name]                  MRN: [id number] Proc. Date: 09/20/00 Adm. Date:  [id number] Attending:  Cordelia Pen Ii                           Operative Report  PREOPERATIVE DIAGNOSIS:  Rectocele and cystocele.  POSTOPERATIVE DIAGNOSIS:  Rectocele and cystocele.  PROCEDURE PERFORMED:  Anterior and posterior vaginal repair.  SURGEON:  Guy Sandifer. Arleta Creek, M.D.  ASSISTANT:  Juluis Mire, M.D.  ESTIMATED BLOOD LOSS:  250 cc.  ANESTHESIA:  General with endotracheal intubation.  INDICATIONS:  This patient is a 65 year old married white female status post TVH in the past with symptomatic rectocele.  Details are dictated in the history and physical.  An anterior and posterior vaginal repair has been discussed with the patient.  Potential risks and complications have been discussed and include, but not to infection, bowel, bladder, or ureteral damage, bleeding requiring transfusion of blood products with possible transfusion reaction, HIV and hepatitis acquisition, DVT, PE, pneumonia, fistula formation, vaginal narrowing, and postoperative dyspareunia.  All questions have been answered and consent is signed on the chart.  DESCRIPTION OF PROCEDURE:  The patient is taken to the operating room and placed in the dorsosupine position where general anesthesia is induced via endotracheal intubation.  She is then placed in the dorsolithotomy position where she is prepped, bladder straight catheterized, and she was draped in a sterile fashion.  The vaginal apex was grasped at each angle with an Allis clamp, and a transverse incision is made through the mucosa.  This is then carried superiorly in a T-fashion in the midline beneath the vaginal mucosa overlying the bladder.  This was done to 1-2 cm below the urethral meatus. The mucosa was then dissected laterally, sharply, and bluntly.  A pursestring suture of 0 Monocryl  was used to reduce the cystocele.  The vesicovaginal fascia is then reapproximated in the midline with an interrupted suture. Excess vaginal mucosa is trimmed and this is then reapproximated in a running locking fashion with 2-0 Monocryl suture.  Then the posterior vaginal repair is carried out.  A diamond shape wedge of tissue is resected overlying the perineal body, and the posterior vaginal mucosa is then dissected from the overlying rectum in the midline.  This is then carried out bilaterally.  The rectovaginal fascia is then reapproximated in the midline with interrupted suture of 0 Monocryl.  Care was taken to maintain adequate caliber of the vagina to allow intercourse.  No vaginal mucosa is trimmed on the superior aspect of the vagina.  The mucosa is reapproximated in a running locking fashion with 2-0 Monocryl down to the level of the vaginal introitus.  The perineal body is dissected and a perineorrhaphy is done with interrupted 0 Monocryl suture.  Minimal tags of the vaginal mucosa are trimmed to even the edges, and the closure is then carried out in a episiotomy type fashion.  A Foley catheter is placed in the bladder and clear urine is noted.  The bladder is then filled retrograde with 400 cc of normal saline.  The suprapubic banano catheter is placed on the first attempt without difficulty.  There is good aspiration of saline with and without the stylet in place.  The bladder is drained while the suprapubic banano catheter is sutured in place with a permanent  suture.  The Foley catheter is removed and a vaginal pack of 2 inch Iodoform gauze is then placed.  All counts are correct.  The patient is awakened and taken to the recovery room in stable condition. DD:  09/20/00 TD:  09/20/00 Job: [id number]

## 2010-08-07 NOTE — H&P (Signed)
[name], [name]             ACCOUNT NO.:  [id number]   MEDICAL RECORD NO.:  [id number]           PATIENT TYPE:   LOCATION:                                 FACILITY:   PHYSICIAN:  Geoffry Paradise, M.D.       DATE OF BIRTH:   DATE OF ADMISSION:  01/17/2006  DATE OF DISCHARGE:                                HISTORY & PHYSICAL   CHIEF COMPLAINT:  Right arm swelling and pain.   HISTORY OF PRESENT ILLNESS:  [name] is a well known patient to myself  at 65 years of age with chronic history of including hypothyroidism,  essential hypertension, and fibromyalgia syndrome presenting this time with  48 hours of redness, swelling, and pain involved in the right arm.  She has  been in excellent health and comes in regularly for routine healthcare.  She  is well-maintained and extremely active.  Current events began Friday  evening when her grandson's cat bit her on the right forearm.  Initially,  she had no significant symptomatology, but as she went to [employer] [place]  with her grandson, the pain and redness as well as swelling began fairly  sudden and intense.  She sought local care through an urgent medical  care/emergency clinic, was placed on Augmentin, and discharged.  She  represented late last evening/early morning with progressive redness and  swelling and intense pain despite 2 doses of the Augmentin.  She has had no  fever or chills.  She has had no nausea or vomiting.  She does have good  motion involving the fingers with pain, consistently throbbing, aching, deep  pain, now accompanied by significant redness and swelling. Acute renal  failure on outpatient antibiotics with progression of symptoms.  She is  admitted for IV antibiotic therapy.   PAST MEDICAL HISTORY:  The patient has no known drug allergies.   MEDICATIONS:  Include:  1. Augmentin 875 b.i.d.  2. Levothyroxine 25 mcg daily.  3. Lisinopril 25 mg daily.  4. Lexapro 40 mg daily.   MEDICAL ILLNESSES:   Include:  1. Essential hypertension.  2. Chronic anxiety.  3. Fibromyalgia.  4. Hypothyroidism.   SURGICAL ILLNESSES:  Include:  1. A fractured kneecap in May 2007.  2. Surgical intervention on her feet in 2004.  3. Total hysterectomy in 1986.   FAMILY HISTORY:  The patient is married and has children and grandchildren.  Does not smoke or drink.   FAMILY HISTORY:  Noncontributory.   PHYSICAL EXAM:  Temperature is 97.6.  Blood pressure 160/90.  Pulse 82.  Respiratory rate is 17.  O2 saturation 94%.  Height 5 feet 4 inches. Weight  193 pounds.  The patient is pleasant, smiling, no distress.  HEENT:  Anicteric.  Pupils are round and reactive.  Oropharynx benign.  NECK:  Supple.  No JVD or bruits.  LUNGS:  Clear.  CARDIOVASCULAR:  Regular rate and rhythm.  No murmur, gallop, rub, or heave.  ABDOMEN:  Soft and nontender.  No hepatosplenomegaly.  EXTREMITIES:  Normal, excluding right upper extremity where she has 2  puncture sites above the wrist  laterally, one inch apart.  Significant  surrounding erythema, tenderness and warmth surrounding down into the dorsum  of the hand up to the mid forearm.  Distal neurovascular intact.  Motor  intact.  No discrete fluctuance.  Severe diffuse tenderness and warmth.  Radial pulse is intact.  No epitrochlear adenopathy or axillary adenopathy.  NEUROLOGIC:  Non-lateralizing.  She is alert, awake, smiling, nontoxic.   ASSESSMENT:  1. Cat bite with significant cellulitis.  Tetanus is up to date as of      2007.  Beginning on Unasyn and elevation.  Clearly no evidence of      drainable abscess.  2. Essential hypertension, stable.  3. Chronic anxiety and fibromyalgia, stable.  4. Hypothyroidism stable.   PLAN:  Antibiotics until clinic improvement as well as analgesia.  Please  see above.           ______________________________  Geoffry Paradise, M.D.     RA/MEDQ  D:  01/17/2006  T:  01/17/2006  Job:  [id number]

## 2010-08-07 NOTE — Op Note (Signed)
[name], [name]             ACCOUNT NO.:  [id number]   MEDICAL RECORD NO.:  [id number]          PATIENT TYPE:  AMB   LOCATION:  SDC                           FACILITY:  [facility]   PHYSICIAN:  Guy Sandifer. Tomblin II, M.D.DATE OF BIRTH:  1945-11-10   DATE OF PROCEDURE:  01/14/2004  DATE OF DISCHARGE:                                 OPERATIVE REPORT   PREOPERATIVE DIAGNOSES:  Left adnexal mass.   POSTOPERATIVE DIAGNOSES:  Left adnexal mass.   PROCEDURE:  Laparoscopic bilateral salpingo-oophorectomy.   SURGEON:  Guy Sandifer. Henderson Cloud, M.D.   ANESTHESIA:  General with endotracheal intubation, Raul Del, M.D.   ESTIMATED BLOOD LOSS:  Drops.   SPECIMENS:  Right and left fallopian tubes and ovaries.   INDICATIONS FOR PROCEDURE:  This patient is a 65 year old, married white  female, G2, P2 status post hysterectomy with left adnexal mass. Details are  dictated in the history and physical. Laparoscopic BSO is discussed with the  patient preoperatively. The potential risks and complications are discussed  including but not limited to infection, bowel, bladder, ureteral damage,  bleeding requiring transfusion of blood products and possible transfusion  reaction, HIV and hepatitis acquisition. DVT and PE, pneumonia, laparotomy.  All questions are answered and consent is signed on the chart.   FINDINGS:  There is some scarification in the right upper quadrant status  post cholecystectomy.  In the pelvis, the left ovary is normal in  appearance. There is a translucent hydrosalpinx adherent to the vaginal  cuff. The right ovary is normal in appearance.   DESCRIPTION OF PROCEDURE:  The patient is taken to the operating room where  she is identified, placed in the dorsal supine position and general  anesthesia is induced via endotracheal intubation.  She is then prepped  abdominally and vaginally, bladder straight catheterized, she is draped in a  sterile fashion.  An infraumbilical incision  is made and dissection is  carried out in layers to the peritoneum which is bluntly perforated.  Anchoring sutures of #0 Vicryl are placed on the fascia at each angle on the  entry.  A disposable Hasson trocar sleeve is placed and tied down with the  anchoring sutures. Placement is verified with the laparoscope and no damage  to surrounding structures is noted. Pneumoperitoneum is induced, a small  suprapubic and later left lower quadrant incisions are made after careful  transillumination. 5 mm nondisposable trocar sleeves are placed through each  incision under direct visualization. The above findings are noted. The left  infundibulopelvic ligament is taken down with the gyrus bipolar cautery  cutting instruments. Dissection is carried out across the mesosalpinx and  the hydrosalpinx drains clear fluid during this process.  It is then  completely removed sharply from the vaginal cuff.  Care is taken to avoid  the course of the ureter. The right infundibulopelvic ligament is taken down  in a similar fashion and the right tube and ovary are freed. Both specimens  are removed through the umbilical trocar sleeve without difficulty.  Inspection under reduced pneumoperitoneum reveals good hemostasis.  Trocar  sleeves are removed. The  umbilical trocar sleeve is removed reducing the  pneumoperitoneum.  Anchoring sutures are tied in the midline which  reapproximate the fascia. 2-0 Vicryl suture is used in a subcutaneous and  subcuticular fashion to close the umbilical incision.  All incisions are  injected with 0.5% plain Marcaine.  The umbilical incision was injected as  well prior to making the incision.  Dermabond is placed on all the  incisions. Although counts are correct, the patient is awakened and taken to  the recovery room in stable condition.      JET/MEDQ  D:  01/14/2004  T:  01/14/2004  Job:  [id number]

## 2010-08-07 NOTE — H&P (Signed)
[facility]  Patient:    [name], [name]                      MRN: [id number] Adm. Date:  09/20/00 Attending:  Guy Sandifer. Arleta Creek, M.D.                         History and Physical  CHIEF COMPLAINT:  Rectocele.  HISTORY OF PRESENT ILLNESS:  This patient is a 65 year old, white married female, G2, P2, status post total vaginal hysterectomy in 1986 who complains of difficulty at times completely emptying her rectum with a bowel movement. She also complains of persistent dyspareunia that has been refractory to medical management. On examination, she has a rectocele which presents at the vaginal introitus when bearing down. She also has a narrowed contraction band with a full 360 degrees of the vagina in the lower one-third. After a careful discussion of the options, the patient is being admitted for anterior-posterior vaginal repair. We have had a careful discussion on the potential risks and complications, including infection, organ damage, bleeding and transfusion, recurrent dyspareunia, and fistula formation.  PAST MEDICAL HISTORY: 1. Chronic hypertension. 2. Status post thyroiditis, 1970. 3. Depression.  PAST SURGICAL HISTORY: 1. Total vaginal hysterectomy in 1986. 2. Tonsillectomy in 1967. 3. Carpal tunnel surgery in 1998 and 1999. 4. Tendon repair in 1999.  OBSTETRIC HISTORY:  Vaginal delivery x 2.  FAMILY HISTORY:  Positive for multiple gestations; maternal grandfather diabetes and maternal grandfather, mother, and aunt; kidney cancer in mother; bladder cancer in father; coronary artery disease in mother; pulmonary disease in father.  CURRENT MEDICATIONS: 1. Premarin 0.625 mg daily. 2. Levoxyl 0.25 mg daily. 3. Effexor XR 75 mg daily. 4. Prinivil 10 mg daily. 5. Nortriptyline 25 mg daily.  ALLERGIES:  No known drug allergies.  SOCIAL HISTORY:  The patient denies tobacco, alcohol, or drug abuse.  REVIEW OF SYSTEMS:  Negative,  except as above.  PHYSICAL EXAMINATION:  VITAL SIGNS:  Height 5 feet 4 inches, weight 167 pounds, blood pressure 120/80.  HEENT/NECK:  Without thyromegaly.  LUNGS:  Clear to auscultation.  HEART:  Regular rate and rhythm.  BACK:  Without CVA tenderness.  BREASTS:  Without mass, tracts, or discharge.  ABDOMEN:  Soft, nontender, without masses.  PELVIC:  Vulva without lesion. Vagina without lesion. There is a 360 degree contraction ring in the lower one-third of the vagina. Adnexa nontender without masses.  RECTAL:  Consistent with a first to second degree rectocele without masses.  EXTREMITIES:  Grossly within normal limits.  NEUROLOGICAL:  Grossly within normal limits.  ASSESSMENT: 1. Symptomatic rectocele. 2. Dyspareunia.  PLAN:  Anterior-posterior vaginal repair. DD:  09/19/00 TD:  09/19/00 Job: [id number]

## 2010-08-07 NOTE — H&P (Signed)
[name], [name]             ACCOUNT NO.:  [id number]   MEDICAL RECORD NO.:  [id number]          PATIENT TYPE:  AMB   LOCATION:  SDC                           FACILITY:  [facility]   PHYSICIAN:  Guy Sandifer. Tomblin II, M.D.DATE OF BIRTH:  1946-03-10   DATE OF ADMISSION:  01/14/2004  DATE OF DISCHARGE:                                HISTORY & PHYSICAL   CHIEF COMPLAINT:  Left adnexal mass.   HISTORY OF PRESENT ILLNESS:  This patient is a 65 year old white female, G2,  P2, status post hysterectomy, who was evaluated for complaints of left lower  quadrant pain.  Ultrasound on November 13, 2003 revealed a 6 x 2 x 4 cm  tubular left adnexal structure, most consistent with a left hydrosalpinx.  Right ovary appeared normal.  Repeat ultrasound on January 09, 2004 revealed  this to be unchanged.  After discussion of the options, the patient is being  admitted for laparoscopy with bilateral salpingo-oophorectomy.  Potential  risks and complications have been reviewed preoperatively.   PAST MEDICAL HISTORY:  1.  Chronic hypertension.  2.  Status post thyroiditis 1970.  3.  Depression.   PAST SURGICAL HISTORY:  1.  Total vaginal hysterectomy in 1986.  2.  Tonsillectomy in 1967.  3.  Bilateral carpal tunnel surgery.  4.  Tendon repair in 1999.  5.  Bunion surgery.  6.  Cholecystectomy.  7.  Status post right foot surgery about 8 weeks ago.   MEDICATIONS:  1.  Levoxyl 0.025 mg daily.  2.  Lisinopril 20 mg daily.  3.  Lexapro 10 mg daily.  4.  Flexeril and alprazolam occasionally.   ALLERGIES:  No known drug allergies.   SOCIAL HISTORY:  The patient denies tobacco, alcohol or drug abuse.   OBSTETRIC HISTORY:  Vaginal delivery x2.   FAMILY HISTORY:  Positive for multiple gestations.  Maternal grandfather:  Diabetes.  Maternal grandfather, mother, and aunt:  Kidney cancer in mother,  bladder cancer in father, coronary artery disease in mother, pulmonary  disease in father.   REVIEW OF  SYSTEMS:  NEUROLOGIC:  Denies headache.  PULMONARY:  Denies  shortness of breath.  CARDIAC:  Denies chest pain.   PHYSICAL EXAMINATION:  VITAL SIGNS:  Height 5 feet 4 inches, weight 190-1/2  pounds, blood pressure 138/84, HEENT:  Without thyromegaly.  LUNGS:  Clear to auscultation.  HEART:  Regular rate, rhythm.  BACK:  Without CVA tenderness.  BREASTS:  Without mass or active discharge.  ABDOMEN:  Soft, nontender without masses.  PELVIC EXAM:  Vault of vagina without lesion.  Adnexa nontender without  palpable masses.  EXTREMITIES/NEUROLOGICAL EXAM:  Grossly within normal limits.   ASSESSMENT:  Pelvic pain and left adnexal mass.   PLAN:  Laparoscopy with bilateral salpingo-oophorectomy.      JET/MEDQ  D:  01/09/2004  T:  01/09/2004  Job:  [id number]

## 2010-08-07 NOTE — Op Note (Signed)
[place]  Patient:    [name], [name] Visit Number: [id number] MRN: [id number]          Service Type: DSU Location: [facility] Attending Physician:  Cordella Gabler Dictated by:   Cordella Wank, D.P.M. Proc. Date: 04/25/01 Admit Date:  04/25/2001                             Operative Report  PREOPERATIVE DIAGNOSIS:  Chronic plantar fasciitis, left.  POSTOPERATIVE DIAGNOSIS:  Chronic plantar fasciitis, left.  ANESTHESIA:  IV sedation with local infiltration.  HEMOSTASIS:  Ankle tourniquet, left.  PROCEDURE:  Endoscopic release, medial fascial band, left.  INDICATION FOR SURGERY:  Chronic discomfort, inability to walk with any degree of comfort, and continued compensation in gait process.  DESCRIPTION OF PROCEDURE:  The patient is brought to the OR and placed in the supine position on the OR table.  The patient is injected with a total of 3 cc in a PT block and 10 cc Xylocaine/Marcaine mixture in an infiltration block. The patients left foot was prepped and draped utilizing standard aseptic technique.  The left foot was exsanguinated utilizing Esmarch.  The left ankle tourniquet was inflated to 250 mmHg, and the following procedure was performed:  Endoscopic release, medial fascial band left.  Attention was directed dorsally to the medial aspect of the left foot, where an approximately 1 cm incision was made just distal to the calcaneal insertion of the plantar fascia.  The incision was deepened to the subcutaneous tissue and a fascial plane was created plantar to the thick plantar fascial band.  A fascial spreader was introduced so as to further create a fascial plane, and the cannula was introduced and brought to the lateral side, where a small incision was made to allow for an exit point.  A 30 degree triangular scope was then introduced from the medial side, and the plantar fascia was visualized.  Utilizing a triangular blade  brought in from the lateral side, the medial one-third of the plantar fascia was excised.  I then switched the camera to the lateral side and brought the blade to the medial side, and I was able to visualize and cut completely the medial one-third of the plantar fascia.  The wound was then flushed with copious amounts of Garamycin solution.  The wound edges were sutured with 4-0 nylon.  A sterile compressive dressing applied to the left foot, the left ankle tourniquet was deflated. Capillary refill was noted to b immediate to all digits of the left foot.  The patient having tolerated both the surgery and the anesthesia well was transferred to the OR in satisfactory condition by anesthesia and was given postop instructions, medications given postop, and discharged by the department of anesthesia. Dictated by:   Cordella Ferron, D.P.M. Attending Physician:  Cordella Contino DD:  04/25/01 TD:  04/26/01 Job: [id number]

## 2010-08-07 NOTE — Op Note (Signed)
[name]  Patient:    [name]                       MRN: [id number] Proc. Date: 07/02/99 Adm. Date:  [id number] Disc. 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  POSTOPERATIVE DIAGNOSES: 1. Chronic lateral epicondylitis, right elbow with extensive partial tearing of    extensor tendons. 2. Lateral synovitis. 3. Marked tendinous degeneration and tearing of extensor carpi radialis brevis    tendon.  PROCEDURES: 1. Right elbow examination under anesthesia with exploration. 2. Extensive debridement of extensor carpi radialis brevis tendon and much more    limited debridement of superficial extensors. 3. Partial synovectomy and arthrotomy, lateral joint, elbow. 4. Drilling epicondyle. 5. Side-to-side repair of superficial extensors and ECRB.  SURGEON:  Loreta Ave, M.D.  ASSISTANT:  Arlys John D. Petrarca, P.A.-C.  ANESTHESIA:  General.  ESTIMATED BLOOD LOSS:  Minimal.  TOURNIQUET TIME:  45 minutes.  SPECIMENS:  Excised areas of mucinous degeneration.  CULTURES:  None.  COMPLICATIONS:  None.  DRESSINGS:  Soft compressive with splint.  DESCRIPTION OF PROCEDURE:  The patient was brought to the operating room and, after adequate anesthesia had been obtained, the right elbow was examined.  Full extension and full flexion stable elbow.  A tourniquet was applied.  Prepped and draped in the usual sterile fashion.  Exsanguinated with elevation.  An Esmarch  tourniquet was inflated to 250 mmHg.  A curved incision above the epicondyle in  line with the extensor tendons.  The skin and subcutaneous tissue was divided.  Areas of focal mucinous degeneration right at the epicondyle in the superficial  extensors.  There was no marked compromise of overall attachment,  however.  The  extensors were opened longitudinally, excising the area of mucinous degeneration. The entire deep tendon ECRB had undergone marked tearing and mucinous degeneration over a distance of 1.5 cm from the epicondyle.  This was resected back to healthy tissue at the level of the annular ligament.  Involvement of the entire capsule  with marked synovitis localized to an area.  All of that excised.  The joint was opened and explored and, other than localized synovitis in this region, remaining structures of the joint were normal in regards to articular cartilage and remaining synovium.  This was copiously irrigated.  Multiple drilling into the epicondyle for later healing.  The extensor tendons were then reapproximated side-to-side with  Vicryl, incorporating the ECRB in the length and position after debridement. Wound irrigated.  The skin and subcutaneous tissue closed with Vicryl and Steri-Strips. The margins of the wound were injected with Marcaine.  Sterile compressive dressing applied with bulky dressing and splint.  The tourniquet was deflated and removed. Anesthesia was reversed.  She was brought to the recovery room.  She tolerated he surgery well with no complications. DD:  07/02/99 TD:  07/02/99 Job: [id number]

## 2010-08-07 NOTE — Op Note (Signed)
[facility]  Patient:    [name], [name]                  MRN: [id number] Proc. Date: 09/20/00 Adm. Date:  [id number] Attending:  Cordelia Pen Ii                           Operative Report  PREOPERATIVE DIAGNOSIS:  Postoperative bleeding.  POSTOPERATIVE DIAGNOSIS:  Postoperative bleeding.  PROCEDURE:  Ligation of postoperative vaginal bleeder.  SURGEON:  Guy Sandifer. Arleta Creek, M.D.  ANESTHESIA:  MAC with 1% Xylocaine local anesthetic.  ESTIMATED BLOOD LOSS:  400 cc.  INDICATIONS AND CONSENTS:  This patient is immediately status post anterior and posterior vaginal repair.  Ten to twenty minutes upon arriving to the recovery room, the patient was noted to have pretty heavy vaginal bleeding soaking two perineal pads in a matter of just a few minutes.  The decision was then made to take the patient back to the operating room.  DESCRIPTION OF PROCEDURE:  The patient was taken to the operating room and placed in dorsal supine position where she was given IV anesthetics.  She was then placed the dorsal lithotomy position where the vaginal pack was removed, and she was prepped in a sterile fashion.  She was then draped.  Careful inspection after the posterior vaginal suture was taken down and revealed a bleeder pumping in the corner of the left rectovaginal fascia and mucosa. This was ligated with two 2-0 Monocryl sutures.  Good hemostasis was maintained.  Then 2-0 Monocryl was used to reapproximate the vaginal mucosa in a running locking fashion.  Observation revealed excellent hemostasis.  A one inch Iodoform gauze was then placed, and the patient was then awakened.  All counts were correct, and the patient was transferred to the recovery room in stable condition. DD:  09/20/00 TD:  09/20/00 Job: [id number]

## 2010-08-07 NOTE — Discharge Summary (Signed)
[name], [name]             ACCOUNT NO.:  [id number]   MEDICAL RECORD NO.:  [id number]          PATIENT TYPE:  INP   LOCATION:  1533                         FACILITY:  [facility]   PHYSICIAN:  Madlyn Frankel. Charlann Boxer, M.D.  DATE OF BIRTH:  Mar 14, 1946   DATE OF ADMISSION:  07/17/2007  DATE OF DISCHARGE:  07/20/2007                               DISCHARGE SUMMARY   ADMITTING DIAGNOSES:  1. Right tibial plateau fracture.  2. Osteoarthritis.  3. Hypothyroidism.  4. Hypertension.  5. Migraine.  6. Anxiety/depression.   DISCHARGE DIAGNOSES:  1. Right tibial plateau fracture.  2. Osteoarthritis.  3. Hyperthyroidism.  4. Hypertension.  5. Migraine.  6. Anxiety.   HISTORY OF PRESENT ILLNESS:  A 65 year old female who fell off a ladder.  Presented to [facility] emergency department where she was discovered to  have a right wrist fracture, subsequently followed up by Dr. Lestine Box  which revealed a right knee tibial plateau fracture displaced 6 mm.  Subsequent care of the right tibial plateau fracture was transferred to  Dr. Charlann Boxer for definitive surgical management.   CONSULTS:  None.   PROCEDURE:  Open reduction internal fixation of a right tibial plateau  fracture with screw fixation.   SURGEON:  Madlyn Frankel. Charlann Boxer, M.D.   ASSISTANT:  Dwyane Luo PA-C.   Allograft bone chips were utilized as well during the procedure.   LABS:  Preadmission CBC:  Hemoglobin/hematocrit 13.3/38.8, platelets  285.  At the time of discharge, hemoglobin 11.3, hematocrit 33 and  platelets 229.  White cell differential all within normal limits.  Her  coagulations were all within normal limits.  Routine chemistry showed at  admission:  Sodium of 142, potassium 3.8, glucose 124, creatinine 0.79.  At the time of discharge:  Sodium 142, potassium 3.7, glucose 148,  creatinine 0.75.  Kidney function remained normal.  Her calcium was 8.6  at discharge.  Her UA was negative.   RADIOLOGY:  Comparative right knee  postoperatively showed a reduction of  the right lateral tibial plateau fracture.  Chest, 2 view:  No active disease.   CARDIOLOGY:  EKG:  Normal sinus rhythm.   HOSPITAL COURSE:  The patient admitted for open reduction internal  fixation of a right tibial plateau fracture with allograft wedges.  She  tolerated the procedure well and was admitted to the orthopedic floor.  Seen on postop day #1, she was uncomfortable.  Her legs did feel heavy.  She was hemodynamically stable.  Dressing was dry.  She was  neurovascularly intact with the right lower extremity.  She was non-  weightbearing with the right lower extremity and in a knee immobilizer  all the time with no range of motion.  She did begin PT and OT with  these limitations.  She was able to ambulate 30 feet with physical  therapy.  Seen the next day, the pain was tolerable, needed a little  extra help on the steps.  Hemodynamically stable, dressing dry, although  the functions normal including bowel and urinary.  She is  neurovascularly intact in the right lower extremity.  She continued to  be  non-weightbearing.  We have to watch her IV.  She was ready for  discharge in the next day provided she made ample progress in physical  therapy.  [facility] was selected.  Seen __________ , she was  ready to go home.  Her right arm cast was irritating.  I adjusted this  so it was more comfortable.  Dressing was changed.  The wound looked  great.  No drainage.  She was ready for discharge home.  Return to our  office in two weeks.   DISCHARGE DISPOSITION:  Discharge home with home health care, PT, stable  and in improved condition.   DISCHARGE DIET:  Regular.   DISCHARGE WOUND:  Keep dry.   DISCHARGE PHYSICAL THERAPY:  Non-weightbearing right lower extremity.   DISCHARGE MEDICATION:  1. Lovenox 40 mg subcu q 24 x 11 days.  2. Colace 100 mg p.o. b.i.d.  3. MiraLax 17 g p.o. q day.  4. Aspirin 325 mg 1 tab daily once  lovenox completed.  5. Lisinopril 20 mg p.o. q d.  6. Levoxyl 0.025 mg daily.  7. Lexapro 20 mg 2 times daily.  8. Boniva 150 mg 1 tablet a month.  9. Alprazolam 0.5 mg p.o. q.h.s.  10.Amoxicillin 500 mg 2 p.o. q.a.m. and 2 p.o. q.p.m.  11.Robaxin 500 mg p.o. q. 8 p.r.n. muscle spasm pain.  12.Oxycodone 5/325 p.o. q 4 to 6 p.r.n.  13.Prednisone tapering dose to be completed the first week in May.   DISCHARGE FOLLOW UP:  Follow with Dr. Charlann Boxer in two weeks at phone number  [phone].     ______________________________  Yetta Glassman. [address], [place]      Madlyn Frankel. Charlann Boxer, M.D.  Electronically Signed    BLM/MEDQ  D:  08/22/2007  T:  08/22/2007  Job:  [id number]

## 2010-08-07 NOTE — H&P (Signed)
[place]  Patient:    [name], [name]                    MRN: [id number] Adm. Date:  [id number] Attending:  Meade Maw A CC:         Richard A. Jacky Kindle, M.D.             Meade Maw, M.D.                         History and Physical  CHIEF COMPLAINT:  Chest pain, presumed unstable angina.  HISTORY OF PRESENT ILLNESS:  [name] is a 65 year old woman who awoke this a.m. with aching and sharp mid-substernal chest pain which radiated across  both sides of her chest and into her neck, teeth, and jaw bilaterally.  It also  radiated to the midback.  This worsened throughout the day, and eventually she as unable to get a deep breath, resulting in her presentation to the [facility] Emergency Room.  Since her arrival here, she has received sublingual and IV nitroglycerin, as well as morphine sulfate. The pain is decreased, but not gone.  She is able to take more deep breaths.  She has had no associated nausea, diaphoresis, or emesis.  She did have an admission for chest  pain which was somewhat similar to this, but much less severe, in September 2000. then a myocardial infarction was ruled out, and an exercise Cardiolite was performed showing no significant ischemia.  PAST MEDICAL HISTORY: 1. Postmenopausal, status post hysterectomy. 2. Depression. 3. Hypertension. 4. Migraine headache. 5. Recent anemia, with a negative colonoscopy.  PAST SURGICAL HISTORY: 1. Bilateral epicondylar tendinitis surgery, right side recently. 2. Sinus surgery.  CURRENT MEDICATIONS: 1. Prinivil, ? dosage, one p.o. q.d. 2. Prozac 20 mg p.o. q.d. 3. Estrace, ? dosage, one p.o. q.d. 4. Percocet one to two p.o. q.4-6h. p.r.n.  ALLERGIES:  No known drug allergies.  FAMILY HISTORY:  Father died at age 110, colon and bladder cancer.  Mother died t age 11, myocardial infarction following peptic ulcer disease surgery.  She  has ne brother and one sister with whom she is not close.  There is no history of early coronary artery disease, diabetes mellitus, or breast cancer.  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  One accompanies her this evening.  She denies any alcohol or tobacco usage.  Caffeine is not excessive.  REVIEW OF SYSTEMS:  No recent weight loss or weight gain.  No fever, chills, malaise.  She has vision in both eyes.  Does wear reading glasses.  Hearing in oth ears.  Teeth and gums are in good repair.  She has not had any thyroid problems. No problems with cough or hemoptysis.  No orthopnea, PND, or lower extremity edema. No tachypalpitations or syncope.  No chronic abdominal pain.  No hematemesis or  melena.  No history of jaundice.  No difficulty with urination.  No hematuria or nocturia.  She has recently been thought to have the diagnosis of fibromyalgia, and has problems with pain in her feet and ankles, but also both hips and upper back, shoulders, and neck.  No muscle weakness.  No history of seizure or stroke.  PHYSICAL EXAMINATION:  VITAL SIGNS:  Blood pressure 118/68, pulse 94 and regular, respirations 20, temperature 97.8 degrees.  O2 saturation on room air 97%.  GENERAL:  This is a  well-appearing mildly obese 65 year old woman, in no distress.  HEENT:  Unremarkable.  The head is atraumatic and normocephalic.  Eyes show pupils equal, round, reactive to light and accommodation.  Extraocular movements intact. Sclerae anicteric.  Oral mucosa is pink and moist.  Teeth and gums in good repair. Pharynx is not injected.  Tongue is not coated.  NECK:  Supple without thyromegaly or masses.  Carotid upstrokes are normal. There is no bruit.  No jugular venous distention.  No thyromegaly.  CHEST:  Clear with adequate excursion bilaterally.  Normal vesicular breath sounds throughout.  CARDIAC:  The precordium is quiet.  Normal S1, S2 heard.  No S3,  S4, murmur, click, or rub noted.  CHEST:  The chest wall is nontender.  ABDOMEN:  Soft, flat, nontender.  No hepatosplenomegaly, no midline pulsatile masses.  No midline abdominal bruit.  Bowel sounds present in all quadrants.  RECTAL:  Examination not performed.  GENITOURINARY:  Genitalia examination not performed.  EXTREMITIES:  Show a full range of motion, no edema.  Intact distal pulses.  NEUROLOGIC:  Cranial nerves II-XII intact.  Motor and sensory grossly intact. Farrel Gordon is not tested.  SKIN:  Warm, dry, and clear.  Electrocardiogram shows sinus rhythm, T-wave inversion, slight ST depression in V1, V2, and V3.  This represents a change from the tracing dated September 2000.  Initial CPK, MB, and troponin are negative.  Admission hemogram, serum electrolytes, BUN, creatinine, and glucose, and pro time all within normal limits.   Chest x-ray shows no active cardiopulmonary disease.  ASSESSMENT: 1. Atypical angina, presumed unstable. 2. Hypertension, under adequate control. 3. History of fibromyalgia. 4. History of hypertension. 5. History of depression. 6. History of chronic migraine headache.  PLAN:  The patient is admitted, to rule out a myocardial infarction.  Will receive aspirin orally and a beta blocker.   She is currently treated initially with IV  heparin and nitroglycerin.  Will continue this.  Serial CPK, MB, enzymes, and troponin level, as well as a repeat electrocardiogram.  Given the history of a normal Cardiolite six months ago, the cardiac catheterization with coronary angiography for diagnosis is probably warranted, especially in view of these recent electrocardiogram changes.DD:  07/09/99 TD:  07/09/99 Job: [id number]

## 2010-08-07 NOTE — Cardiovascular Report (Signed)
[place]. [facility]  Patient:    ANNIBELLE, BRAZIE                    MRN: [id number] Proc. Date: 07/10/99 Adm. Date:  [id number] Attending:  Meade Maw A CC:         Richard A. Jacky Kindle, M.D.                        Cardiac Catheterization  PROCEDURE:  Left heart catheterization.  INDICATIONS FOR PROCEDURE:  Chest pain with T wave inversion in the precordial leads.  REFERRING PHYSICIAN:  Richard A. Jacky Kindle, M.D.  DESCRIPTION OF PROCEDURE:  After obtaining written informed consent, the patient was brought to the cardiac catheter lab in a postabsorptive state. Preoperative sedation was achieved using IV Versed.  The right groin was prepped and draped in the usual sterile fashion.  Local anesthesia was achieved using 1% Xylocaine.  A 6-French hemostasis sheath was placed into the right femoral artery using the modified Seldinger technique.  A selective coronary angiography was performed using the JL4 and JR4 Judkins catheters. All catheter exchanges were made over a guide wire.  The hemostasis sheath was flushed following each engagement.  Following review of the film, there was no identifiable coronary artery disease.  The hemostasis sheath was removed. Hemostasis was achieved using digital pressure.  FINDINGS:  Aortic pressure was 141/93.  LV pressure 141/20.  A single plane ventriculogram revealed normal wall motion.  EF was approximately 65-70%.  There was no mitral regurgitation noted.  Coronary angiography: 1. The left main coronary artery bifurcated into the left anterior descending    and circumflex vessels.  There was no significant disease in the left main    coronary artery. 2. The left anterior descending artery gave rise to a large D-1, small D-2,    small D-3, and ended as an apical recurrent.  There was no significant    disease in the left anterior descending artery or the diagonal branches. 3. The circumflex vessel gave rise to a  small OM-1, OM-2, and OM-3.  There was    no significant disease in the circumflex or its branches. 4. The right coronary artery was dominant and gave rise to a small RV    marginal, a large PDA, and a large PL branch.  There was no significant    disease in the right coronary artery or its branches.  IMPRESSION:  Normal coronary angiography.  Normal ventriculogram.  RECOMMENDATIONS:  Consider other etiologies for her chest pain.DD:  07/10/99 TD:  07/12/99 Job: [id number]

## 2010-08-10 NOTE — Telephone Encounter (Signed)
Spoke with patient aware of results.

## 2010-08-10 NOTE — Progress Notes (Signed)
Quick Note:  Pt aware of results and recs ______ 

## 2010-10-21 ENCOUNTER — Emergency Department (HOSPITAL_COMMUNITY)
Admission: EM | Admit: 2010-10-21 | Discharge: 2010-10-22 | Disposition: A | Discharge status: Home / Self Care | Payer: Medicare Other | Attending: Emergency Medicine | Admitting: Emergency Medicine

## 2010-10-21 DIAGNOSIS — L02219 Cutaneous abscess of trunk, unspecified: Secondary | ICD-10-CM | POA: Insufficient documentation

## 2010-10-21 DIAGNOSIS — I10 Essential (primary) hypertension: Secondary | ICD-10-CM | POA: Insufficient documentation

## 2010-10-21 DIAGNOSIS — L03319 Cellulitis of trunk, unspecified: Secondary | ICD-10-CM | POA: Insufficient documentation

## 2010-11-02 ENCOUNTER — Encounter (INDEPENDENT_AMBULATORY_CARE_PROVIDER_SITE_OTHER): Payer: Self-pay | Admitting: General Surgery

## 2010-11-02 ENCOUNTER — Ambulatory Visit (INDEPENDENT_AMBULATORY_CARE_PROVIDER_SITE_OTHER): Payer: Medicare Other | Admitting: General Surgery

## 2010-11-02 VITALS — BP 122/78 | HR 80 | Ht 64.0 in | Wt 216.0 lb

## 2010-11-02 DIAGNOSIS — L0291 Cutaneous abscess, unspecified: Secondary | ICD-10-CM

## 2010-11-02 DIAGNOSIS — L0390 Cellulitis, unspecified: Secondary | ICD-10-CM

## 2010-11-02 NOTE — Progress Notes (Signed)
[name] is a 65 y.o. female.    Chief Complaint  Patient presents with  . Follow-up    wound/ abscess on left side    HPI HPI This patient was referred for evaluation of left lower quadrant abdominal wall skin abscess which has been present for approximately 2 weeks. She states that it really improved after starting clindamycin after a visit to the emergency room. She had originally some cellulitis around the area which improved after starting clindamycin but subsequently had some increasing tenderness and redness. She was seen in followup by her primary physician who changed the antibiotics to doxycycline and the wound began draining a few days ago. Since she had the drainage or redness has decreased and her pain has improved. She denies any fevers. She states that this originally started as a "pimple". She does not remember if there was any prior skin nodules or masses prior to infection.  Past Medical History  Diagnosis Date  . OSA (obstructive sleep apnea)   . Eustachian tube dysfunction   . Hypothyroid   . Hypertension   . GERD (gastroesophageal reflux disease)   . Hyperlipidemia   . Osteopenia   . Lung nodule     CT abd by [facility] 09/29/2009    Past Surgical History  Procedure Date  . Colonoscopy   . Wrist fracture surgery 2009  . Patella fracture surgery 2009    Right  . Ankle surgery   . Anteriorvesicourethropexy   . Total abdominal hysterectomy   . Bunionectomy   . Nasal sinus surgery   . Tonsillectomy     Family History  Problem Relation Age of Onset  . Heart attack Mother   . Colon cancer Father     Social History History  Substance Use Topics  . Smoking status: Never Smoker   . Smokeless tobacco: Not on file  . Alcohol Use: Not on file    No Known Allergies  Current Outpatient Prescriptions  Medication Sig Dispense Refill  . ALPRAZolam (XANAX) 0.5 MG tablet as needed.        . citalopram (CELEXA) 20 MG tablet 1.5 tabs by mouth  once daily .       . levothyroxine (SYNTHROID, LEVOTHROID) 25 MCG tablet Take 25 mcg by mouth daily.        . metoprolol tartrate (LOPRESSOR) 25 MG tablet Take 25 mg by mouth 2 (two) times daily.       . calcium carbonate (OS-CAL) 600 MG TABS Take 600 mg by mouth 2 (two) times daily.        . Cholecalciferol (VITAMIN D3) 2000 UNITS TABS Take 1 tablet by mouth daily.        Marland Kitchen estradiol (ESTRACE) 0.1 MG/GM vaginal cream 2 (two) times a week.        . traMADol (ULTRAM) 50 MG tablet Take 1 tablet by mouth Once daily as needed.        Review of Systems Review of Systems  Constitutional: Negative.  Negative for fever and chills.  HENT: Positive for hearing loss and congestion.   Eyes: Negative.   Cardiovascular: Negative.   Gastrointestinal: Negative.   Genitourinary: Negative.   Musculoskeletal: Positive for joint pain.  Skin: Positive for itching.  Neurological: Negative for dizziness, focal weakness, seizures and loss of consciousness.  Endo/Heme/Allergies: Negative.   Psychiatric/Behavioral: Negative.     Physical Exam Physical Exam  Constitutional: She is oriented to person, place, and time. She appears well-developed and well-nourished. No distress.  HENT:  Head: Normocephalic and atraumatic.  Mouth/Throat: No oropharyngeal exudate.  Eyes: EOM are normal. Pupils are equal, round, and reactive to light. Right eye exhibits no discharge. Left eye exhibits no discharge. No scleral icterus.  Neck: No tracheal deviation present.  Respiratory: Effort normal. No stridor. No respiratory distress.  GI: Soft. She exhibits no distension. There is no tenderness. There is no guarding.  Musculoskeletal: Normal range of motion.  Neurological: She is alert and oriented to person, place, and time.  Skin: She is not diaphoretic.       4mm opening with seropurulent discharge on LLQ of abdominal wall, wound probed and no residual purulence, shallow wound, wound packed.  No cellulitis or warmth. Soft  tissue US performed which did not show any residual fluid collection.    Psychiatric: She has a normal mood and affect. Her behavior is normal. Judgment and thought content normal.     Blood pressure 122/78, pulse 80, height 5\' 4"  (1.626 m), weight 216 lb (97.977 kg).  Assessment/Plan Abdominal wall skin abscess  It appears that the abscess has spontaneously drained. I ultrasounded the area at the bedside and I did not see any undrained fluid collection. I also probed the wound and there was no residual purulence. The wound was packed through the existing opening and I think that it will continue to heal without difficulty. There is no cellulitis, however she is already on antibiotics. I recommended that she continue to pack the wound as long as it will accommodate packing. However, the cavity is very small and likely will heal soon. I recommended that she followup in one week if the area is not healed. I also recommended that she followup in approximately 2 months if there remains a nodule in the skin after a lesion is healed and the infection is cleared. If there is a remaining nodular skin cyst then we can plan for elective removal of this.  Lodema Pilot DAVID 11/02/2010, 3:05 PM

## 2010-11-02 NOTE — Patient Instructions (Signed)
Continue to pack the wound daily until healed.  Return to clinic if redness returns or fevers or increasing pain.

## 2010-12-15 LAB — CBC
HCT: 32.9 — ABNORMAL LOW
HCT: 33 — ABNORMAL LOW
Hemoglobin: 11.2 — ABNORMAL LOW
MCHC: 34.2
MCHC: 34.3
MCV: 83.2
MCV: 83.9
MCV: 84.4
Platelets: 229
Platelets: 285
RBC: 3.9
RBC: 4.66
WBC: 10.8 — ABNORMAL HIGH

## 2010-12-15 LAB — BASIC METABOLIC PANEL
BUN: 10
BUN: 10
BUN: 8
CO2: 26
CO2: 27
Chloride: 108
Chloride: 109
Chloride: 110
Creatinine, Ser: 0.75
Creatinine, Ser: 0.79
GFR calc Af Amer: >60
Glucose, Bld: 148 — ABNORMAL HIGH
Potassium: 3.7
Potassium: 4.6
Sodium: 141

## 2010-12-15 LAB — DIFFERENTIAL
Basophils Relative: 1
Eosinophils Absolute: 0.3
Eosinophils Relative: 4
Monocytes Relative: 4
Neutrophils Relative %: 59

## 2010-12-15 LAB — URINALYSIS, ROUTINE W REFLEX MICROSCOPIC
Glucose, UA: NEGATIVE
Hgb urine dipstick: NEGATIVE
Ketones, ur: NEGATIVE
Protein, ur: NEGATIVE

## 2010-12-15 LAB — TYPE AND SCREEN

## 2010-12-15 LAB — PROTIME-INR: INR: 1

## 2010-12-17 ENCOUNTER — Other Ambulatory Visit: Payer: Self-pay | Admitting: Internal Medicine

## 2010-12-22 ENCOUNTER — Ambulatory Visit
Admission: RE | Admit: 2010-12-22 | Discharge: 2010-12-22 | Disposition: A | Discharge status: Home / Self Care | Payer: Medicare Other | Source: Ambulatory Visit | Attending: Internal Medicine | Admitting: Internal Medicine

## 2010-12-22 MED ORDER — IOHEXOL 300 MG/ML  SOLN
100.0000 mL | Freq: Once | INTRAMUSCULAR | Status: AC | PRN
  Administered 2010-12-22: 100 mL via INTRAVENOUS

## 2010-12-29 ENCOUNTER — Encounter: Payer: Self-pay | Admitting: Internal Medicine

## 2010-12-29 ENCOUNTER — Telehealth: Payer: Self-pay | Admitting: Internal Medicine

## 2010-12-29 ENCOUNTER — Ambulatory Visit (INDEPENDENT_AMBULATORY_CARE_PROVIDER_SITE_OTHER)
Admission: RE | Admit: 2010-12-29 | Discharge: 2010-12-29 | Disposition: A | Discharge status: Home / Self Care | Payer: Medicare Other | Source: Ambulatory Visit | Attending: Internal Medicine | Admitting: Internal Medicine

## 2010-12-29 ENCOUNTER — Ambulatory Visit (INDEPENDENT_AMBULATORY_CARE_PROVIDER_SITE_OTHER): Payer: Medicare Other | Admitting: Internal Medicine

## 2010-12-29 VITALS — BP 144/86 | HR 87 | Temp 97.3°F | Ht 63.0 in | Wt 219.2 lb

## 2010-12-29 DIAGNOSIS — R918 Other nonspecific abnormal finding of lung field: Secondary | ICD-10-CM

## 2010-12-29 DIAGNOSIS — J329 Chronic sinusitis, unspecified: Secondary | ICD-10-CM

## 2010-12-29 DIAGNOSIS — J309 Allergic rhinitis, unspecified: Secondary | ICD-10-CM

## 2010-12-29 DIAGNOSIS — J984 Other disorders of lung: Secondary | ICD-10-CM

## 2010-12-29 DIAGNOSIS — G4733 Obstructive sleep apnea (adult) (pediatric): Secondary | ICD-10-CM

## 2010-12-29 MED ORDER — CEFDINIR 300 MG PO CAPS: 300.0000 mg | ORAL_CAPSULE | Freq: Two times a day (BID) | ORAL | Status: AC

## 2010-12-29 NOTE — Telephone Encounter (Signed)
I spoke with pt and she c/o sinus pressure, nasal congestion, sinus headache, vomiting x this morning. Pt also states she has been having problems with her cpap machine as well. Pt requested to be seen this AM. Dr. Maple Hudson had an opening at 9:45 and pt is coming in at that time

## 2010-12-29 NOTE — Patient Instructions (Addendum)
Order- PCC- [facility]- autotitrate CPAP 5-12 x 2 weeks for pressure check  Dx OSA              CT sinuses- dx sinusitis               CXR- lung nodules  Suggest- try Neti pot Skip CPAP for a few days till more comfortable  Script for antibiotic sent

## 2010-12-29 NOTE — Progress Notes (Signed)
Patient ID: [name], female    DOB: 1945/06/23, 65 y.o.   MRN: [id number]  HPI 07/28/10- 64 yoF followed for sleep apnea, allergic rhinitis, hx of lung nodule. Last here March 8- reviewed.  She continues CPAP 13.  Good compliance and control. She changed to nasal pillows mask- Advanced- likes this mask better. She liked sample Nasonex but didn't need script.  Notes occasional mild wheeze and some chest tightness- questions if she might have a little asthma- never diagnosed. It doesn't restrict activity. CTa 11/27/09- showed bilat lower lobe nodules unchanged from 09/29/09 w/ recommendation for recheck in 6 months.   12/29/10- 64 yoF followed for sleep apnea, allergic rhinitis, hx of lung nodule. CPAP uncomfortable because it is overdrying her. At last visit we reduced his CPAP to 12 which did help. She wakes with retro-orbital pain and even nausea. The humidifier is insufficient. Saw Dr.TEOH for sinusitis 4-5 months ago and was treated with antibiotics. She noted the dryness than. Has been evaluated by Dr. Jacky Kindle for sweating. 2 days ago, she blew "green chunks" out of her nose.  ICT 07/28/10- IMPRESSION:  Stable pulmonary nodules. The largest is 6 mm. Stability at 12  months supports benign etiology if there are are no risk factors.  Stability at 2 years supports benign etiology for a high risk  patient. This recommendation follows the consensus statement:  Guidelines for Management of Small Pulmonary Nodules Detected on CT  Scans: A Statement from the Fleischner Society as published in  Radiology 2005; 237:395-400. Available online at:  DietDisorder.cz.  Original Report Authenticated By: Donavan Burnet, M.D.     Review of Systems See HPI Constitutional:   No-   weight loss, night sweats, fevers, chills, fatigue, lassitude. HEENT:   No-  headaches, difficulty swallowing, tooth/dental problems, sore throat,       No-  sneezing, itching, ear  ache,   +nasal congestion, post nasal drip,  CV:  No-   chest pain, orthopnea, PND, swelling in lower extremities, anasarca, dizziness, palpitations Resp: No-   shortness of breath with exertion or at rest.              No-   productive cough,  No non-productive cough,  No-  coughing up of blood.              No-   change in color of mucus.  No- wheezing.   Skin: No-   rash or lesions. GI:  No-   heartburn, indigestion, abdominal pain, nausea, vomiting, diarrhea,                 change in bowel habits, loss of appetite GU: No-   dysuria, change in color of urine, no urgency or frequency.  No- flank pain. MS:  No-   joint pain or swelling.  No- decreased range of motion.  No- back pain. Neuro- grossly normal to observation, Or:  Psych:  No- change in mood or affect. No depression or anxiety.  No memory loss.   Objective:   Physical Exam General- Alert, Oriented, Affect-appropriate, Distress- none acute Skin- rash-none, lesions- none, excoriation- none Lymphadenopathy- none Head- atraumatic            Eyes- Gross vision intact, PERRLA, conjunctivae clear secretions            Ears- Hearing, canals-normal            Nose- turbinate edema, no-Septal dev, mucus, polyps, erosion, perforation  Throat- Mallampati III , mucosa clear , drainage- none, tonsils- atrophic Neck- flexible , trachea midline, no stridor , thyroid nl, carotid no bruit Chest - symmetrical excursion , unlabored           Heart/CV- RRR , no murmur , no gallop  , no rub, nl s1 s2                           - JVD- none , edema- none, stasis changes- none, varices- none           Lung- clear to P&A, wheeze- none, cough- none , dullness-none, rub- none           Chest wall-  Abd- tender-no, distended-no, bowel sounds-present, HSM- no Br/ Gen/ Rectal- Not done, not indicated Extrem- cyanosis- none, clubbing, none, atrophy- none, strength- nl Neuro- grossly intact to observation

## 2010-12-30 ENCOUNTER — Other Ambulatory Visit: Payer: Self-pay | Admitting: Internal Medicine

## 2010-12-30 ENCOUNTER — Ambulatory Visit (INDEPENDENT_AMBULATORY_CARE_PROVIDER_SITE_OTHER)
Admission: RE | Admit: 2010-12-30 | Discharge: 2010-12-30 | Disposition: A | Discharge status: Home / Self Care | Payer: Medicare Other | Source: Ambulatory Visit | Attending: Internal Medicine | Admitting: Internal Medicine

## 2010-12-30 DIAGNOSIS — J329 Chronic sinusitis, unspecified: Secondary | ICD-10-CM

## 2010-12-31 NOTE — Assessment & Plan Note (Signed)
For followup chest x-ray

## 2010-12-31 NOTE — Assessment & Plan Note (Signed)
Compliance and control seem good. We continue to work on comfort and to consider alternatives

## 2010-12-31 NOTE — Assessment & Plan Note (Addendum)
I doubt an active sinusitis at this time.

## 2011-01-01 ENCOUNTER — Telehealth: Payer: Self-pay | Admitting: Internal Medicine

## 2011-01-01 NOTE — Telephone Encounter (Signed)
CT sinus- changes consistent with chronic sinusitis/ inflammation, but not different from study done 08/28/08

## 2011-01-01 NOTE — Telephone Encounter (Signed)
I spoke with pt and she is requesting her sinus ct results. Please advise Dr. Maple Hudson, thanks  Carver Fila, CMA

## 2011-01-04 NOTE — Telephone Encounter (Signed)
I spoke with patient about results and he verbalized understanding and had no questions 

## 2011-01-06 NOTE — Progress Notes (Signed)
Quick Note:  Pt aware of results. ______ 

## 2011-01-28 ENCOUNTER — Ambulatory Visit (INDEPENDENT_AMBULATORY_CARE_PROVIDER_SITE_OTHER): Payer: Medicare Other | Admitting: Internal Medicine

## 2011-01-28 ENCOUNTER — Encounter: Payer: Self-pay | Admitting: Internal Medicine

## 2011-01-28 VITALS — BP 170/88 | HR 87 | Ht 64.0 in | Wt 222.2 lb

## 2011-01-28 DIAGNOSIS — F5104 Psychophysiologic insomnia: Secondary | ICD-10-CM

## 2011-01-28 DIAGNOSIS — G4733 Obstructive sleep apnea (adult) (pediatric): Secondary | ICD-10-CM

## 2011-01-28 DIAGNOSIS — G4700 Insomnia, unspecified: Secondary | ICD-10-CM

## 2011-01-28 DIAGNOSIS — J984 Other disorders of lung: Secondary | ICD-10-CM

## 2011-01-28 MED ORDER — ZALEPLON 10 MG PO CAPS: 10.0000 mg | ORAL_CAPSULE | Freq: Every day | ORAL | Status: DC

## 2011-01-28 NOTE — Progress Notes (Signed)
Patient ID: [name], female    DOB: 11-Nov-1945, 65 y.o.   MRN: [id number]  HPI 07/28/10- 65 yoF followed for sleep apnea, allergic rhinitis, hx of lung nodule. Last here March 8- reviewed.  She continues CPAP 13.  Good compliance and control. She changed to nasal pillows mask- [facility]- likes this mask better. She liked sample Nasonex but didn't need script.  Notes occasional mild wheeze and some chest tightness- questions if she might have a little asthma- never diagnosed. It doesn't restrict activity. CTa 11/27/09- showed bilat lower lobe nodules unchanged from 09/29/09 w/ recommendation for recheck in 6 months.   12/29/10- 65 yoF followed for sleep apnea, allergic rhinitis, hx of lung nodule. CPAP uncomfortable because it is overdrying her. At last visit we reduced his CPAP to 12 which did help. She wakes with retro-orbital pain and even nausea. The humidifier is insufficient. Saw Dr.TEOH for sinusitis 4-5 months ago and was treated with antibiotics. She noted the dryness than. Has been evaluated by Dr. Jacky Kindle for sweating. 2 days ago, she blew "green chunks" out of her nose.  01/28/11- 65 yoF followed for sleep apnea, allergic rhinitis, hx of stable lung nodule. Had flu vax. Antibiotic last visit did clear sinusitis last visit. She still experiences sinus pressure discomfort as she continues to use CPAP at least 4 hours every night, using a nasal mask. Sleep onset is easy, but she often wakes, restless and slept-out, around 1:30 -2:30 AM. Alprazolam some help, but may leave mild headache. We discussed alternatives. Husband is not attentive, but doesn't report break through snoring. Download did confirm good compliance and control at 11-12 cwp.  Review of Systems See HPI Constitutional:   No-   weight loss, night sweats, fevers, chills, fatigue, lassitude. HEENT:   Mild  headaches, No-difficulty swallowing, tooth/dental problems, sore throat,       No-  sneezing, itching, ear ache,    +nasal congestion, post nasal drip- improved,  CV:  No-   chest pain, orthopnea, PND, swelling in lower extremities, anasarca, dizziness, palpitations Resp: No-   shortness of breath with exertion or at rest.              No-   productive cough,  No non-productive cough,  No-  coughing up of blood.              No-   change in color of mucus.  No- wheezing.   Skin: No-   rash or lesions. GI:  No-   heartburn, indigestion, abdominal pain, nausea, vomiting, diarrhea,                 change in bowel habits, loss of appetite GU: No-   dysuria, change in color of urine, no urgency or frequency.  No- flank pain. MS:  No-   joint pain or swelling.  No- decreased range of motion.  No- back pain. Neuro- grossly normal to observation, Or:  Psych:  No- change in mood or affect. No depression or anxiety.  No memory loss.   Objective:   Physical Exam General- Alert, Oriented, Affect-appropriate, Distress- none acute, overweight Skin- rash-none, lesions- none, excoriation- none Lymphadenopathy- none Head- atraumatic            Eyes- Gross vision intact, PERRLA, conjunctivae clear secretions            Ears- Hearing, canals-normal            Nose- turbinate edema, no-Septal dev, mucus, polyps, erosion,  perforation             Throat- Mallampati III , mucosa clear , drainage- none, tonsils- atrophic Neck- flexible , trachea midline, no stridor , thyroid nl, carotid no bruit Chest - symmetrical excursion , unlabored           Heart/CV- RRR , no murmur , no gallop  , no rub, nl s1 s2                           - JVD- none , edema- none, stasis changes- none, varices- none           Lung- clear to P&A, wheeze- none, cough- none , dullness-none, rub- none           Chest wall-  Abd- tender-no, distended-no, bowel sounds-present, HSM- no Br/ Gen/ Rectal- Not done, not indicated Extrem- cyanosis- none, clubbing, none, atrophy- none, strength- nl Neuro- grossly intact to observation

## 2011-01-28 NOTE — Patient Instructions (Signed)
Order- [facility]- reduce CPAP to 10  Please call as needed, and especially if you don't like the CPAP change  Script for Sonata generic- for occasional use if you wake in the middle of the night with 3-4 hours more sleep planned

## 2011-01-28 NOTE — Assessment & Plan Note (Signed)
It doesn't sound as if OSA is waking her. We will try a short half-life med, zaleplon, instead of alprazolam for waking after sleep onset. Sleep hygiene reviewed. Reducing CPAP for comfort and she knows to call if this isn't good.

## 2011-01-28 NOTE — Assessment & Plan Note (Signed)
Weight loss would help. We are reducing pressure to 10 for trial because of persistent discomfort at 12. Husband will be asked to notice if she starts to snore.

## 2011-01-28 NOTE — Assessment & Plan Note (Signed)
Nodules will be followed over time. If never big enough to be seen on CXR, then they are probably benign.

## 2011-07-28 ENCOUNTER — Encounter: Payer: Self-pay | Admitting: Internal Medicine

## 2011-07-28 ENCOUNTER — Ambulatory Visit (INDEPENDENT_AMBULATORY_CARE_PROVIDER_SITE_OTHER): Payer: Medicare Other | Admitting: Internal Medicine

## 2011-07-28 VITALS — BP 110/70 | HR 79 | Ht 64.0 in | Wt 205.0 lb

## 2011-07-28 DIAGNOSIS — G4700 Insomnia, unspecified: Secondary | ICD-10-CM

## 2011-07-28 DIAGNOSIS — J984 Other disorders of lung: Secondary | ICD-10-CM

## 2011-07-28 DIAGNOSIS — F5104 Psychophysiologic insomnia: Secondary | ICD-10-CM

## 2011-07-28 DIAGNOSIS — G4733 Obstructive sleep apnea (adult) (pediatric): Secondary | ICD-10-CM

## 2011-07-28 NOTE — Patient Instructions (Signed)
Please call as needed 

## 2011-07-28 NOTE — Progress Notes (Signed)
Patient ID: [name], female    DOB: Aug 03, 1945, 66 y.o.   MRN: [id number]  HPI 07/28/10- 66 yoF followed for sleep apnea, allergic rhinitis, hx of lung nodule. Last here March 8- reviewed.  She continues CPAP 13.  Good compliance and control. She changed to nasal pillows mask- [facility]- likes this mask better. She liked sample Nasonex but didn't need script.  Notes occasional mild wheeze and some chest tightness- questions if she might have a little asthma- never diagnosed. It doesn't restrict activity. CTa 11/27/09- showed bilat lower lobe nodules unchanged from 09/29/09 w/ recommendation for recheck in 6 months.   12/29/10- 66 yoF followed for sleep apnea, allergic rhinitis, hx of lung nodule. CPAP uncomfortable because it is overdrying her. At last visit we reduced her CPAP to 12 which did help. She wakes with retro-orbital pain and even nausea. The humidifier is insufficient. Saw Dr.TEOH for sinusitis 4-5 months ago and was treated with antibiotics. She noted the dryness then. Has been evaluated by Dr. Jacky Kindle for sweating. 2 days ago, she blew "green chunks" out of her nose.  01/28/11- 66 yoF followed for sleep apnea, allergic rhinitis, hx of stable lung nodule. Had flu vax. Antibiotic last visit did clear sinusitis. She still experiences sinus pressure discomfort as she continues to use CPAP at least 4 hours every night, using a nasal mask. Sleep onset is easy, but she often wakes, restless and slept-out, around 1:30 -2:30 AM. Alprazolam some help, but may leave mild headache. We discussed alternatives. Husband is not attentive, but doesn't report break through snoring. Download did confirm good compliance and control at 11-12 cwp.  07/28/11- 66 yoF followed for sleep apnea, allergic rhinitis, hx of stable lung nodule, DMII Uses CPAP every night for approx 6-7 hours; pressure doing well for patient; has not taken card to [facility] for download-will take by today so we can get report. More  comfortable since pressure reduced to 10. Uses an occasional Sonata-5 mg is sufficient. Hip pain limits exercise. Dyspnea on exertion is primarily due to deconditioning. New diagnosis diabetes/ insulin dependent. CXR 01/06/11- IMPRESSION:  No evidence for acute cardiopulmonary abnormality.  Original Report Authenticated By: Patterson Hammersmith, M.D.   Review of Systems-See HPI Constitutional:   No-   weight loss, , fevers, chills, fatigue, lassitude. HEENT:   Mild  headaches, No-difficulty swallowing, tooth/dental problems, sore throat,       No-  sneezing, itching, ear ache,   +nasal congestion, post nasal drip- improved,  CV:  No-   chest pain, orthopnea, PND, swelling in lower extremities, anasarca, dizziness, palpitations Resp: No-   shortness of breath with exertion or at rest.              No-   productive cough,  No non-productive cough,  No-  coughing up of blood.              No-   change in color of mucus.  No- wheezing.   Skin: No-   rash or lesions. GI:  No-   heartburn, indigestion, abdominal pain, nausea, vomiting, diarrhea,                 change in bowel habits, loss of appetite GU: . MS:  No-   joint pain or swelling.  Neuro- grossly normal to observation, Or:  Psych:  No- change in mood or affect. No depression or anxiety.  No memory loss.   Objective:   Physical Exam General- Alert, Oriented,  Affect-appropriate, Distress- none acute, overweight Skin- rash-none, lesions- none, excoriation- none Lymphadenopathy- none Head- atraumatic            Eyes- Gross vision intact, PERRLA, conjunctivae clear secretions            Ears- Hearing, canals-normal            Nose- turbinate edema, no-Septal dev, mucus, polyps, erosion, perforation             Throat- Mallampati III , mucosa clear , drainage- none, tonsils- atrophic Neck- flexible , trachea midline, no stridor , thyroid nl, carotid no bruit Chest - symmetrical excursion , unlabored           Heart/CV- RRR , no  murmur , no gallop  , no rub, nl s1 s2                           - JVD- none , edema- none, stasis changes- none, varices- none           Lung- clear to P&A, wheeze- none, cough- none , dullness-none, rub- none           Chest wall-  Abd-  Br/ Gen/ Rectal- Not done, not indicated Extrem- cyanosis- none, clubbing, none, atrophy- none, strength- nl Neuro- grossly intact to observation

## 2011-08-01 NOTE — Assessment & Plan Note (Signed)
We will follow occasionally

## 2011-08-01 NOTE — Assessment & Plan Note (Signed)
Sonata 5 mg used occasionally when needed, has worked well.

## 2011-08-01 NOTE — Assessment & Plan Note (Signed)
Good compliance and control at 10 CWP. Weight loss would help.

## 2011-10-29 ENCOUNTER — Other Ambulatory Visit: Payer: Self-pay | Admitting: Internal Medicine

## 2011-11-01 NOTE — Telephone Encounter (Signed)
Ok to refill 

## 2011-11-01 NOTE — Telephone Encounter (Signed)
Please advise if okay to refill. Thanks.  

## 2012-01-03 ENCOUNTER — Other Ambulatory Visit: Payer: Self-pay | Admitting: Internal Medicine

## 2012-01-03 DIAGNOSIS — N63 Unspecified lump in unspecified breast: Secondary | ICD-10-CM

## 2012-01-06 ENCOUNTER — Ambulatory Visit
Admission: RE | Admit: 2012-01-06 | Discharge: 2012-01-06 | Disposition: A | Discharge status: Home / Self Care | Payer: Medicare Other | Source: Ambulatory Visit | Attending: Internal Medicine | Admitting: Internal Medicine

## 2012-01-06 DIAGNOSIS — N63 Unspecified lump in unspecified breast: Secondary | ICD-10-CM

## 2012-02-02 ENCOUNTER — Ambulatory Visit: Payer: Medicare Other | Admitting: Internal Medicine

## 2012-03-20 ENCOUNTER — Encounter: Payer: Self-pay | Admitting: Internal Medicine

## 2012-03-20 ENCOUNTER — Ambulatory Visit (INDEPENDENT_AMBULATORY_CARE_PROVIDER_SITE_OTHER): Payer: Medicare Other | Admitting: Internal Medicine

## 2012-03-20 VITALS — BP 120/74 | HR 97 | Ht 63.0 in | Wt 202.0 lb

## 2012-03-20 DIAGNOSIS — J984 Other disorders of lung: Secondary | ICD-10-CM

## 2012-03-20 DIAGNOSIS — G4700 Insomnia, unspecified: Secondary | ICD-10-CM

## 2012-03-20 DIAGNOSIS — G4733 Obstructive sleep apnea (adult) (pediatric): Secondary | ICD-10-CM

## 2012-03-20 DIAGNOSIS — F5104 Psychophysiologic insomnia: Secondary | ICD-10-CM

## 2012-03-20 MED ORDER — ESZOPICLONE 2 MG PO TABS: 2.0000 mg | ORAL_TABLET | Freq: Every day | ORAL | Status: DC

## 2012-03-20 NOTE — Progress Notes (Signed)
Patient ID: [name], female    DOB: Feb 18, 1946, 66 y.o.   MRN: [id number]  HPI 07/28/10- 64 yoF followed for sleep apnea, allergic rhinitis, hx of lung nodule. Last here March 8- reviewed.  She continues CPAP 13.  Good compliance and control. She changed to nasal pillows mask- [facility]- likes this mask better. She liked sample Nasonex but didn't need script.  Notes occasional mild wheeze and some chest tightness- questions if she might have a little asthma- never diagnosed. It doesn't restrict activity. CTa 11/27/09- showed bilat lower lobe nodules unchanged from 09/29/09 w/ recommendation for recheck in 6 months.   12/29/10- 64 yoF followed for sleep apnea, allergic rhinitis, hx of lung nodule. CPAP uncomfortable because it is overdrying her. At last visit we reduced her CPAP to 12 which did help. She wakes with retro-orbital pain and even nausea. The humidifier is insufficient. Saw Dr.TEOH for sinusitis 4-5 months ago and was treated with antibiotics. She noted the dryness then. Has been evaluated by Dr. Jacky Kindle for sweating. 2 days ago, she blew "green chunks" out of her nose.  01/28/11- 64 yoF followed for sleep apnea, allergic rhinitis, hx of stable lung nodule. Had flu vax. Antibiotic last visit did clear sinusitis. She still experiences sinus pressure discomfort as she continues to use CPAP at least 4 hours every night, using a nasal mask. Sleep onset is easy, but she often wakes, restless and slept-out, around 1:30 -2:30 AM. Alprazolam some help, but may leave mild headache. We discussed alternatives. Husband is not attentive, but doesn't report break through snoring. Download did confirm good compliance and control at 11-12 cwp.  07/28/11- 64 yoF followed for sleep apnea, allergic rhinitis, hx of stable lung nodule, DMII Uses CPAP every night for approx 6-7 hours; pressure doing well for patient; has not taken card to [facility] for download-will take by today so we can get report. More  comfortable since pressure reduced to 10. Uses an occasional Sonata-5 mg is sufficient. Hip pain limits exercise. Dyspnea on exertion is primarily due to deconditioning. New diagnosis diabetes/ insulin dependent. CXR 01/06/11- IMPRESSION:  No evidence for acute cardiopulmonary abnormality.  Original Report Authenticated By: Patterson Hammersmith, M.D.   03/20/12- 64 yoF followed for sleep apnea, allergic rhinitis, hx of stable lung nodule, DMII FOLLOWS FOR: uses CPAP every night. denies any problems with the machine or mask. CPAP 10/ [facility] the head showed good compliance and control on 10 CWP in May, 2013. We are looking for a more recent one. Bedtime between 10 and 11 PM comfortable sleep latency. She still tends to wake around 2:30 or 3 AM. Alprazolam at bedtime well. When Sonata was tried instead at time of wake, it would not put her back to sleep. No naps but tired by 2 PM.  // needs CXR for hx nodules, next visit//  Review of Systems-See HPI Constitutional:   No-   weight loss, , fevers, chills, fatigue, lassitude. HEENT:   Mild  headaches, No-difficulty swallowing, tooth/dental problems, sore throat,       No-  sneezing, itching, ear ache,   +nasal congestion, post nasal drip- improved,  CV:  No-   chest pain, orthopnea, PND, swelling in lower extremities, anasarca, dizziness, palpitations Resp: No-   shortness of breath with exertion or at rest.              No-   productive cough,  No non-productive cough,  No-  coughing up of blood.  No-   change in color of mucus.  No- wheezing.   Skin: No-   rash or lesions. GI:  No-   heartburn, indigestion, abdominal pain, nausea, vomiting,  GU: . MS:  No-   joint pain or swelling.  Neuro- nothing unusual:  Psych:  No- change in mood or affect. No depression or anxiety.  No memory loss.  Objective:   Physical Exam General- Alert, Oriented, Affect-appropriate, Distress- none acute, overweight Skin- rash-none,  lesions- none, excoriation- none Lymphadenopathy- none Head- atraumatic            Eyes- Gross vision intact, PERRLA, conjunctivae clear secretions            Ears- Hearing, canals-normal            Nose- turbinate edema, no-Septal dev, mucus, polyps, erosion, perforation             Throat- Mallampati III , mucosa clear , drainage- none, tonsils- atrophic Neck- flexible , trachea midline, no stridor , thyroid nl, carotid no bruit Chest - symmetrical excursion , unlabored           Heart/CV- RRR , no murmur , no gallop  , no rub, nl s1 s2                           - JVD- none , edema- none, stasis changes- none, varices- none           Lung- clear to P&A, wheeze- none, cough- none , dullness-none, rub- none           Chest wall-  Abd-  Br/ Gen/ Rectal- Not done, not indicated Extrem- cyanosis- none, clubbing, none, atrophy- none, strength- nl Neuro- grossly intact to observation

## 2012-03-20 NOTE — Patient Instructions (Addendum)
We can continue CPAP 10/ advanced  Try samples/ Script Lunesta 2 mg, 1 for sleep at bedtime if needed

## 2012-03-27 ENCOUNTER — Encounter: Payer: Self-pay | Admitting: Internal Medicine

## 2012-03-31 NOTE — Assessment & Plan Note (Signed)
Good compliance and control on 10 CWP. Sleep consolidation/insomnia issue as noted above.

## 2012-03-31 NOTE — Assessment & Plan Note (Signed)
Plan-reassess in the future as appropriate.

## 2012-03-31 NOTE — Assessment & Plan Note (Signed)
She has had considerable counseling on sleep hygiene and management of insomnia, connected to management of her sleep apnea. Plan-try Lunesta 2 mg at bedtime

## 2012-05-09 DIAGNOSIS — R030 Elevated blood-pressure reading, without diagnosis of hypertension: Secondary | ICD-10-CM | POA: Diagnosis not present

## 2012-07-12 DIAGNOSIS — H903 Sensorineural hearing loss, bilateral: Secondary | ICD-10-CM | POA: Diagnosis not present

## 2012-07-19 DIAGNOSIS — Z1331 Encounter for screening for depression: Secondary | ICD-10-CM | POA: Diagnosis not present

## 2012-07-19 DIAGNOSIS — I10 Essential (primary) hypertension: Secondary | ICD-10-CM | POA: Diagnosis not present

## 2012-07-19 DIAGNOSIS — IMO0001 Reserved for inherently not codable concepts without codable children: Secondary | ICD-10-CM | POA: Diagnosis not present

## 2012-07-19 DIAGNOSIS — E039 Hypothyroidism, unspecified: Secondary | ICD-10-CM | POA: Diagnosis not present

## 2012-07-19 DIAGNOSIS — Z6835 Body mass index (BMI) 35.0-35.9, adult: Secondary | ICD-10-CM | POA: Diagnosis not present

## 2012-07-19 DIAGNOSIS — E785 Hyperlipidemia, unspecified: Secondary | ICD-10-CM | POA: Diagnosis not present

## 2012-07-19 DIAGNOSIS — F329 Major depressive disorder, single episode, unspecified: Secondary | ICD-10-CM | POA: Diagnosis not present

## 2012-07-19 DIAGNOSIS — E119 Type 2 diabetes mellitus without complications: Secondary | ICD-10-CM | POA: Diagnosis not present

## 2012-07-19 DIAGNOSIS — E669 Obesity, unspecified: Secondary | ICD-10-CM | POA: Diagnosis not present

## 2012-08-08 DIAGNOSIS — I10 Essential (primary) hypertension: Secondary | ICD-10-CM | POA: Diagnosis not present

## 2012-08-08 DIAGNOSIS — E119 Type 2 diabetes mellitus without complications: Secondary | ICD-10-CM | POA: Diagnosis not present

## 2012-08-23 DIAGNOSIS — D1801 Hemangioma of skin and subcutaneous tissue: Secondary | ICD-10-CM | POA: Diagnosis not present

## 2012-08-23 DIAGNOSIS — L819 Disorder of pigmentation, unspecified: Secondary | ICD-10-CM | POA: Diagnosis not present

## 2012-08-23 DIAGNOSIS — L738 Other specified follicular disorders: Secondary | ICD-10-CM | POA: Diagnosis not present

## 2012-08-23 DIAGNOSIS — D235 Other benign neoplasm of skin of trunk: Secondary | ICD-10-CM | POA: Diagnosis not present

## 2012-08-23 DIAGNOSIS — L821 Other seborrheic keratosis: Secondary | ICD-10-CM | POA: Diagnosis not present

## 2012-09-13 DIAGNOSIS — M7750 Other enthesopathy of unspecified foot: Secondary | ICD-10-CM | POA: Diagnosis not present

## 2012-09-13 DIAGNOSIS — M722 Plantar fascial fibromatosis: Secondary | ICD-10-CM | POA: Diagnosis not present

## 2012-09-13 DIAGNOSIS — M7730 Calcaneal spur, unspecified foot: Secondary | ICD-10-CM | POA: Diagnosis not present

## 2012-09-20 DIAGNOSIS — M7750 Other enthesopathy of unspecified foot: Secondary | ICD-10-CM | POA: Diagnosis not present

## 2012-09-20 DIAGNOSIS — M722 Plantar fascial fibromatosis: Secondary | ICD-10-CM | POA: Diagnosis not present

## 2012-10-18 DIAGNOSIS — M7750 Other enthesopathy of unspecified foot: Secondary | ICD-10-CM | POA: Diagnosis not present

## 2012-10-20 ENCOUNTER — Ambulatory Visit: Payer: Medicare Other | Admitting: Internal Medicine

## 2012-10-26 ENCOUNTER — Ambulatory Visit (INDEPENDENT_AMBULATORY_CARE_PROVIDER_SITE_OTHER)
Admission: RE | Admit: 2012-10-26 | Discharge: 2012-10-26 | Disposition: A | Discharge status: Home / Self Care | Payer: Medicare Other | Source: Ambulatory Visit | Attending: Internal Medicine | Admitting: Internal Medicine

## 2012-10-26 ENCOUNTER — Encounter: Payer: Self-pay | Admitting: Internal Medicine

## 2012-10-26 ENCOUNTER — Ambulatory Visit (INDEPENDENT_AMBULATORY_CARE_PROVIDER_SITE_OTHER): Payer: Medicare Other | Admitting: Internal Medicine

## 2012-10-26 VITALS — BP 108/70 | HR 75 | Ht 63.0 in | Wt 208.4 lb

## 2012-10-26 DIAGNOSIS — R911 Solitary pulmonary nodule: Secondary | ICD-10-CM | POA: Diagnosis not present

## 2012-10-26 DIAGNOSIS — G4733 Obstructive sleep apnea (adult) (pediatric): Secondary | ICD-10-CM

## 2012-10-26 DIAGNOSIS — J984 Other disorders of lung: Secondary | ICD-10-CM

## 2012-10-26 DIAGNOSIS — R0989 Other specified symptoms and signs involving the circulatory and respiratory systems: Secondary | ICD-10-CM | POA: Diagnosis not present

## 2012-10-26 DIAGNOSIS — R0609 Other forms of dyspnea: Secondary | ICD-10-CM | POA: Diagnosis not present

## 2012-10-26 NOTE — Patient Instructions (Addendum)
Order- [facility]- replace worn out CPAP machine 10 cwp, mask of choice, humidifier, supplies     Dx OSA  Order- CXR    Dx lung nodule

## 2012-10-26 NOTE — Progress Notes (Signed)
Patient ID: [name], female    DOB: [birth year], 67 y.o.   MRN: [id number]  HPI 07/28/10- 64 yoF followed for sleep apnea, allergic rhinitis, hx of lung nodule. Last here March 8- reviewed.  She continues CPAP 13.  Good compliance and control. She changed to nasal pillows mask- [facility]- likes this mask better. She liked sample Nasonex but didn't need script.  Notes occasional mild wheeze and some chest tightness- questions if she might have a little asthma- never diagnosed. It doesn't restrict activity. CTa 11/27/09- showed bilat lower lobe nodules unchanged from 09/29/09 w/ recommendation for recheck in 6 months.   12/29/10- 64 yoF followed for sleep apnea, allergic rhinitis, hx of lung nodule. CPAP uncomfortable because it is overdrying her. At last visit we reduced her CPAP to 12 which did help. She wakes with retro-orbital pain and even nausea. The humidifier is insufficient. Saw Dr.TEOH for sinusitis 4-5 months ago and was treated with antibiotics. She noted the dryness then. Has been evaluated by Dr. Jacky Kindle for sweating. 2 days ago, she blew "green chunks" out of her nose.  01/28/11- 64 yoF followed for sleep apnea, allergic rhinitis, hx of stable lung nodule. Had flu vax. Antibiotic last visit did clear sinusitis. She still experiences sinus pressure discomfort as she continues to use CPAP at least 4 hours every night, using a nasal mask. Sleep onset is easy, but she often wakes, restless and slept-out, around 1:30 -2:30 AM. Alprazolam some help, but may leave mild headache. We discussed alternatives. Husband is not attentive, but doesn't report break through snoring. Download did confirm good compliance and control at 11-12 cwp.  07/28/11- 64 yoF followed for sleep apnea, allergic rhinitis, hx of stable lung nodule, DMII Uses CPAP every night for approx 6-7 hours; pressure doing well for patient; has not taken card to [facility] for download-will take by today so we can get report. More  comfortable since pressure reduced to 10. Uses an occasional Sonata-5 mg is sufficient. Hip pain limits exercise. Dyspnea on exertion is primarily due to deconditioning. New diagnosis diabetes/ insulin dependent. CXR 01/06/11- IMPRESSION:  No evidence for acute cardiopulmonary abnormality.  Original Report Authenticated By: Patterson Hammersmith, M.D.   03/20/12- 64 yoF followed for sleep apnea, allergic rhinitis, hx of stable lung nodule, DMII FOLLOWS FOR: uses CPAP every night. denies any problems with the machine or mask. CPAP 10/ [facility] the head showed good compliance and control on 10 CWP in May, 2013. We are looking for a more recent one. Bedtime between 10 and 11 PM comfortable sleep latency. She still tends to wake around 2:30 or 3 AM. Alprazolam at bedtime well. When Sonata was tried instead at time of wake, it would not put her back to sleep. No naps but tired by 2 PM.  // needs CXR for hx nodules, next visit//  10/26/12- 64 yoF Never smoker followed for sleep apnea/ CPAP, allergic rhinitis, hx of stable lung nodule, DMII FOLLOWS FOR: Wears CPAP every night for at least 6-7 hours; pressure working well for patient; needs new CPAP 10/ [facility] machine-has smell to it-? motor going bad per [facility]. Good compliance download.  Review of Systems-See HPI Constitutional:   No-   weight loss, , fevers, chills, fatigue, lassitude. HEENT:   Mild  headaches, No-difficulty swallowing, tooth/dental problems, sore throat,       No-  sneezing, itching, ear ache,   +nasal congestion, post nasal drip- improved,  CV:  No-   chest pain,  orthopnea, PND, swelling in lower extremities, anasarca, dizziness, palpitations Resp: No-   shortness of breath with exertion or at rest.              No-   productive cough,  No non-productive cough,  No-  coughing up of blood.              No-   change in color of mucus.  No- wheezing.   Skin: No-   rash or lesions. GI:  No-   heartburn, indigestion,  abdominal pain, nausea, vomiting,  GU: . MS:  No-   joint pain or swelling.  Neuro- nothing unusual:  Psych:  No- change in mood or affect. No depression or anxiety.  No memory loss.  Objective:   Physical Exam General- Alert, Oriented, Affect-appropriate, Distress- none acute, overweight Skin- rash-none, lesions- none, excoriation- none Lymphadenopathy- none Head- atraumatic            Eyes- Gross vision intact, PERRLA, conjunctivae clear secretions            Ears- Hearing, canals-normal            Nose- turbinate edema, no-Septal dev, mucus, polyps, erosion, perforation             Throat- Mallampati III , mucosa clear , drainage- none, tonsils- atrophic Neck- flexible , trachea midline, no stridor , thyroid nl, carotid no bruit Chest - symmetrical excursion , unlabored           Heart/CV- RRR , no murmur , no gallop  , no rub, nl s1 s2                           - JVD- none , edema- none, stasis changes- none, varices- none           Lung- clear to P&A, wheeze- none, cough- none , dullness-none, rub- none           Chest wall-  Abd-  Br/ Gen/ Rectal- Not done, not indicated Extrem- cyanosis- none, clubbing, none, atrophy- none, strength- nl Neuro- grossly intact to observation

## 2012-10-27 NOTE — Progress Notes (Signed)
Quick Note:  LMTCB ______ 

## 2012-10-30 ENCOUNTER — Telehealth: Payer: Self-pay | Admitting: Internal Medicine

## 2012-10-30 NOTE — Telephone Encounter (Signed)
Notes Recorded by Waymon Budge, MD on 10/26/2012 at 1:03 PM CXR- stable and normal. Any nodules seen before on CT are not visible on CXR and likely stable and benign. --  I spoke with patient about results and she verbalized understanding and had no questions

## 2012-10-31 ENCOUNTER — Telehealth: Payer: Self-pay | Admitting: Internal Medicine

## 2012-10-31 NOTE — Telephone Encounter (Signed)
Will forward to CY as FYI and any other advice he may have.

## 2012-11-01 NOTE — Telephone Encounter (Signed)
Spoke with patient-she states that she has had her CPAP machine since 2008; [facility] found the needed information and got patient her new CPAP. Nothing more needed at this time.

## 2012-11-07 NOTE — Assessment & Plan Note (Signed)
The pressure is comfortable. Her machine is wearing out  We discussed the replacement process

## 2012-11-07 NOTE — Assessment & Plan Note (Signed)
Low concern in this never smoker  But will follow chest x-ray

## 2012-11-09 DIAGNOSIS — E119 Type 2 diabetes mellitus without complications: Secondary | ICD-10-CM | POA: Diagnosis not present

## 2012-11-09 DIAGNOSIS — R030 Elevated blood-pressure reading, without diagnosis of hypertension: Secondary | ICD-10-CM | POA: Diagnosis not present

## 2012-11-09 DIAGNOSIS — E669 Obesity, unspecified: Secondary | ICD-10-CM | POA: Diagnosis not present

## 2012-11-13 DIAGNOSIS — Z23 Encounter for immunization: Secondary | ICD-10-CM | POA: Diagnosis not present

## 2012-11-23 DIAGNOSIS — K648 Other hemorrhoids: Secondary | ICD-10-CM | POA: Diagnosis not present

## 2012-11-23 DIAGNOSIS — Z86010 Personal history of colonic polyps: Secondary | ICD-10-CM | POA: Diagnosis not present

## 2012-11-23 DIAGNOSIS — D126 Benign neoplasm of colon, unspecified: Secondary | ICD-10-CM | POA: Diagnosis not present

## 2012-11-23 DIAGNOSIS — D128 Benign neoplasm of rectum: Secondary | ICD-10-CM | POA: Diagnosis not present

## 2012-12-06 DIAGNOSIS — K648 Other hemorrhoids: Secondary | ICD-10-CM | POA: Diagnosis not present

## 2012-12-20 DIAGNOSIS — K648 Other hemorrhoids: Secondary | ICD-10-CM | POA: Diagnosis not present

## 2012-12-21 DIAGNOSIS — E119 Type 2 diabetes mellitus without complications: Secondary | ICD-10-CM | POA: Diagnosis not present

## 2012-12-21 DIAGNOSIS — E785 Hyperlipidemia, unspecified: Secondary | ICD-10-CM | POA: Diagnosis not present

## 2012-12-21 DIAGNOSIS — E039 Hypothyroidism, unspecified: Secondary | ICD-10-CM | POA: Diagnosis not present

## 2012-12-28 DIAGNOSIS — E785 Hyperlipidemia, unspecified: Secondary | ICD-10-CM | POA: Diagnosis not present

## 2012-12-28 DIAGNOSIS — E669 Obesity, unspecified: Secondary | ICD-10-CM | POA: Diagnosis not present

## 2012-12-28 DIAGNOSIS — Z6835 Body mass index (BMI) 35.0-35.9, adult: Secondary | ICD-10-CM | POA: Diagnosis not present

## 2012-12-28 DIAGNOSIS — F329 Major depressive disorder, single episode, unspecified: Secondary | ICD-10-CM | POA: Diagnosis not present

## 2012-12-28 DIAGNOSIS — IMO0001 Reserved for inherently not codable concepts without codable children: Secondary | ICD-10-CM | POA: Diagnosis not present

## 2012-12-28 DIAGNOSIS — Z0000 Encounter for general adult medical examination without abnormal findings: Secondary | ICD-10-CM | POA: Diagnosis not present

## 2012-12-28 DIAGNOSIS — E119 Type 2 diabetes mellitus without complications: Secondary | ICD-10-CM | POA: Diagnosis not present

## 2012-12-28 DIAGNOSIS — I10 Essential (primary) hypertension: Secondary | ICD-10-CM | POA: Diagnosis not present

## 2012-12-28 DIAGNOSIS — E039 Hypothyroidism, unspecified: Secondary | ICD-10-CM | POA: Diagnosis not present

## 2013-01-01 DIAGNOSIS — M65839 Other synovitis and tenosynovitis, unspecified forearm: Secondary | ICD-10-CM | POA: Diagnosis not present

## 2013-01-03 DIAGNOSIS — K648 Other hemorrhoids: Secondary | ICD-10-CM | POA: Diagnosis not present

## 2013-01-25 ENCOUNTER — Other Ambulatory Visit: Payer: Self-pay

## 2013-01-29 DIAGNOSIS — M65839 Other synovitis and tenosynovitis, unspecified forearm: Secondary | ICD-10-CM | POA: Diagnosis not present

## 2013-02-08 DIAGNOSIS — R030 Elevated blood-pressure reading, without diagnosis of hypertension: Secondary | ICD-10-CM | POA: Diagnosis not present

## 2013-02-26 DIAGNOSIS — M65839 Other synovitis and tenosynovitis, unspecified forearm: Secondary | ICD-10-CM | POA: Diagnosis not present

## 2013-03-07 DIAGNOSIS — Z13820 Encounter for screening for osteoporosis: Secondary | ICD-10-CM | POA: Diagnosis not present

## 2013-03-20 DIAGNOSIS — I10 Essential (primary) hypertension: Secondary | ICD-10-CM | POA: Diagnosis not present

## 2013-03-20 DIAGNOSIS — E669 Obesity, unspecified: Secondary | ICD-10-CM | POA: Diagnosis not present

## 2013-03-20 DIAGNOSIS — Z6835 Body mass index (BMI) 35.0-35.9, adult: Secondary | ICD-10-CM | POA: Diagnosis not present

## 2013-04-17 DIAGNOSIS — E669 Obesity, unspecified: Secondary | ICD-10-CM | POA: Diagnosis not present

## 2013-04-17 DIAGNOSIS — Z6834 Body mass index (BMI) 34.0-34.9, adult: Secondary | ICD-10-CM | POA: Diagnosis not present

## 2013-04-17 DIAGNOSIS — E119 Type 2 diabetes mellitus without complications: Secondary | ICD-10-CM | POA: Diagnosis not present

## 2013-04-17 DIAGNOSIS — R030 Elevated blood-pressure reading, without diagnosis of hypertension: Secondary | ICD-10-CM | POA: Diagnosis not present

## 2013-06-07 DIAGNOSIS — E669 Obesity, unspecified: Secondary | ICD-10-CM | POA: Diagnosis not present

## 2013-06-07 DIAGNOSIS — Z6834 Body mass index (BMI) 34.0-34.9, adult: Secondary | ICD-10-CM | POA: Diagnosis not present

## 2013-06-07 DIAGNOSIS — E785 Hyperlipidemia, unspecified: Secondary | ICD-10-CM | POA: Diagnosis not present

## 2013-06-07 DIAGNOSIS — I10 Essential (primary) hypertension: Secondary | ICD-10-CM | POA: Diagnosis not present

## 2013-06-07 DIAGNOSIS — E1169 Type 2 diabetes mellitus with other specified complication: Secondary | ICD-10-CM | POA: Diagnosis not present

## 2013-06-07 DIAGNOSIS — IMO0001 Reserved for inherently not codable concepts without codable children: Secondary | ICD-10-CM | POA: Diagnosis not present

## 2013-06-07 DIAGNOSIS — E039 Hypothyroidism, unspecified: Secondary | ICD-10-CM | POA: Diagnosis not present

## 2013-06-11 DIAGNOSIS — M65849 Other synovitis and tenosynovitis, unspecified hand: Secondary | ICD-10-CM | POA: Diagnosis not present

## 2013-06-11 DIAGNOSIS — M65839 Other synovitis and tenosynovitis, unspecified forearm: Secondary | ICD-10-CM | POA: Diagnosis not present

## 2013-07-03 DIAGNOSIS — S63599A Other specified sprain of unspecified wrist, initial encounter: Secondary | ICD-10-CM | POA: Diagnosis not present

## 2013-07-03 DIAGNOSIS — M65839 Other synovitis and tenosynovitis, unspecified forearm: Secondary | ICD-10-CM | POA: Diagnosis not present

## 2013-07-03 DIAGNOSIS — S66819A Strain of other specified muscles, fascia and tendons at wrist and hand level, unspecified hand, initial encounter: Secondary | ICD-10-CM | POA: Diagnosis not present

## 2013-07-03 DIAGNOSIS — M65849 Other synovitis and tenosynovitis, unspecified hand: Secondary | ICD-10-CM | POA: Diagnosis not present

## 2013-07-09 DIAGNOSIS — M65839 Other synovitis and tenosynovitis, unspecified forearm: Secondary | ICD-10-CM | POA: Diagnosis not present

## 2013-07-09 DIAGNOSIS — M65849 Other synovitis and tenosynovitis, unspecified hand: Secondary | ICD-10-CM | POA: Diagnosis not present

## 2013-07-09 DIAGNOSIS — Z4789 Encounter for other orthopedic aftercare: Secondary | ICD-10-CM | POA: Diagnosis not present

## 2013-07-13 DIAGNOSIS — M65839 Other synovitis and tenosynovitis, unspecified forearm: Secondary | ICD-10-CM | POA: Diagnosis not present

## 2013-07-13 DIAGNOSIS — M65849 Other synovitis and tenosynovitis, unspecified hand: Secondary | ICD-10-CM | POA: Diagnosis not present

## 2013-07-13 DIAGNOSIS — M6580 Other synovitis and tenosynovitis, unspecified site: Secondary | ICD-10-CM | POA: Diagnosis not present

## 2013-07-13 DIAGNOSIS — Z4789 Encounter for other orthopedic aftercare: Secondary | ICD-10-CM | POA: Diagnosis not present

## 2013-07-16 DIAGNOSIS — M65849 Other synovitis and tenosynovitis, unspecified hand: Secondary | ICD-10-CM | POA: Diagnosis not present

## 2013-07-16 DIAGNOSIS — M65839 Other synovitis and tenosynovitis, unspecified forearm: Secondary | ICD-10-CM | POA: Diagnosis not present

## 2013-07-19 DIAGNOSIS — I10 Essential (primary) hypertension: Secondary | ICD-10-CM | POA: Diagnosis not present

## 2013-07-19 DIAGNOSIS — E119 Type 2 diabetes mellitus without complications: Secondary | ICD-10-CM | POA: Diagnosis not present

## 2013-07-19 DIAGNOSIS — Z6834 Body mass index (BMI) 34.0-34.9, adult: Secondary | ICD-10-CM | POA: Diagnosis not present

## 2013-07-19 DIAGNOSIS — E669 Obesity, unspecified: Secondary | ICD-10-CM | POA: Diagnosis not present

## 2013-07-20 DIAGNOSIS — M6580 Other synovitis and tenosynovitis, unspecified site: Secondary | ICD-10-CM | POA: Diagnosis not present

## 2013-07-24 DIAGNOSIS — M6580 Other synovitis and tenosynovitis, unspecified site: Secondary | ICD-10-CM | POA: Diagnosis not present

## 2013-07-27 DIAGNOSIS — M6580 Other synovitis and tenosynovitis, unspecified site: Secondary | ICD-10-CM | POA: Diagnosis not present

## 2013-07-31 DIAGNOSIS — M6580 Other synovitis and tenosynovitis, unspecified site: Secondary | ICD-10-CM | POA: Diagnosis not present

## 2013-08-03 DIAGNOSIS — M6580 Other synovitis and tenosynovitis, unspecified site: Secondary | ICD-10-CM | POA: Diagnosis not present

## 2013-08-07 DIAGNOSIS — M659 Synovitis and tenosynovitis, unspecified: Secondary | ICD-10-CM | POA: Diagnosis not present

## 2013-08-10 DIAGNOSIS — M659 Synovitis and tenosynovitis, unspecified: Secondary | ICD-10-CM | POA: Diagnosis not present

## 2013-08-14 DIAGNOSIS — M6580 Other synovitis and tenosynovitis, unspecified site: Secondary | ICD-10-CM | POA: Diagnosis not present

## 2013-08-17 DIAGNOSIS — M659 Synovitis and tenosynovitis, unspecified: Secondary | ICD-10-CM | POA: Diagnosis not present

## 2013-08-21 DIAGNOSIS — M659 Synovitis and tenosynovitis, unspecified: Secondary | ICD-10-CM | POA: Diagnosis not present

## 2013-08-23 ENCOUNTER — Other Ambulatory Visit: Payer: Self-pay

## 2013-08-23 DIAGNOSIS — D239 Other benign neoplasm of skin, unspecified: Secondary | ICD-10-CM | POA: Diagnosis not present

## 2013-08-23 DIAGNOSIS — L738 Other specified follicular disorders: Secondary | ICD-10-CM | POA: Diagnosis not present

## 2013-08-23 DIAGNOSIS — L821 Other seborrheic keratosis: Secondary | ICD-10-CM | POA: Diagnosis not present

## 2013-08-23 DIAGNOSIS — L905 Scar conditions and fibrosis of skin: Secondary | ICD-10-CM | POA: Diagnosis not present

## 2013-08-23 DIAGNOSIS — D485 Neoplasm of uncertain behavior of skin: Secondary | ICD-10-CM | POA: Diagnosis not present

## 2013-08-23 DIAGNOSIS — L678 Other hair color and hair shaft abnormalities: Secondary | ICD-10-CM | POA: Diagnosis not present

## 2013-08-23 DIAGNOSIS — M659 Synovitis and tenosynovitis, unspecified: Secondary | ICD-10-CM | POA: Diagnosis not present

## 2013-08-23 DIAGNOSIS — L819 Disorder of pigmentation, unspecified: Secondary | ICD-10-CM | POA: Diagnosis not present

## 2013-08-28 DIAGNOSIS — M659 Synovitis and tenosynovitis, unspecified: Secondary | ICD-10-CM | POA: Diagnosis not present

## 2013-08-30 DIAGNOSIS — M659 Synovitis and tenosynovitis, unspecified: Secondary | ICD-10-CM | POA: Diagnosis not present

## 2013-09-03 DIAGNOSIS — M659 Synovitis and tenosynovitis, unspecified: Secondary | ICD-10-CM | POA: Diagnosis not present

## 2013-09-06 DIAGNOSIS — M659 Synovitis and tenosynovitis, unspecified: Secondary | ICD-10-CM | POA: Diagnosis not present

## 2013-09-14 DIAGNOSIS — M659 Synovitis and tenosynovitis, unspecified: Secondary | ICD-10-CM | POA: Diagnosis not present

## 2013-09-19 DIAGNOSIS — M659 Synovitis and tenosynovitis, unspecified: Secondary | ICD-10-CM | POA: Diagnosis not present

## 2013-09-27 DIAGNOSIS — M659 Synovitis and tenosynovitis, unspecified: Secondary | ICD-10-CM | POA: Diagnosis not present

## 2013-10-04 DIAGNOSIS — Z6834 Body mass index (BMI) 34.0-34.9, adult: Secondary | ICD-10-CM | POA: Diagnosis not present

## 2013-10-04 DIAGNOSIS — Z1331 Encounter for screening for depression: Secondary | ICD-10-CM | POA: Diagnosis not present

## 2013-10-04 DIAGNOSIS — E039 Hypothyroidism, unspecified: Secondary | ICD-10-CM | POA: Diagnosis not present

## 2013-10-04 DIAGNOSIS — E119 Type 2 diabetes mellitus without complications: Secondary | ICD-10-CM | POA: Diagnosis not present

## 2013-10-04 DIAGNOSIS — IMO0001 Reserved for inherently not codable concepts without codable children: Secondary | ICD-10-CM | POA: Diagnosis not present

## 2013-10-04 DIAGNOSIS — E785 Hyperlipidemia, unspecified: Secondary | ICD-10-CM | POA: Diagnosis not present

## 2013-10-04 DIAGNOSIS — I10 Essential (primary) hypertension: Secondary | ICD-10-CM | POA: Diagnosis not present

## 2013-10-26 ENCOUNTER — Ambulatory Visit: Payer: Medicare Other | Admitting: Internal Medicine

## 2013-10-29 ENCOUNTER — Encounter: Payer: Self-pay | Admitting: Internal Medicine

## 2013-10-29 ENCOUNTER — Telehealth: Payer: Self-pay

## 2013-10-29 ENCOUNTER — Ambulatory Visit (INDEPENDENT_AMBULATORY_CARE_PROVIDER_SITE_OTHER): Payer: Medicare Other | Admitting: Internal Medicine

## 2013-10-29 VITALS — BP 130/72 | HR 69 | Ht 63.0 in | Wt 205.0 lb

## 2013-10-29 DIAGNOSIS — G4733 Obstructive sleep apnea (adult) (pediatric): Secondary | ICD-10-CM | POA: Diagnosis not present

## 2013-10-29 DIAGNOSIS — R911 Solitary pulmonary nodule: Secondary | ICD-10-CM

## 2013-10-29 NOTE — Telephone Encounter (Signed)
Per CY- Pt's cpap compliance is good: pressure works well for her and she is having a good response to it, and is wearing it appropriately.  Spoke with pt, she is aware of these results and recs.  Nothing further needed.

## 2013-10-29 NOTE — Patient Instructions (Signed)
We can continue CPAP 10/ [facility]  Order- Please request latest CPAP download from [facility]

## 2013-10-29 NOTE — Progress Notes (Signed)
Patient ID: [name], female    DOB: Jan 13, 1946, 68 y.o.   MRN: [id number]  HPI 07/28/10- 64 yoF followed for sleep apnea, allergic rhinitis, hx of lung nodule. Last here March 8- reviewed.  She continues CPAP 13.  Good compliance and control. She changed to nasal pillows mask- Advanced- likes this mask better. She liked sample Nasonex but didn't need script.  Notes occasional mild wheeze and some chest tightness- questions if she might have a little asthma- never diagnosed. It doesn't restrict activity. CTa 11/27/09- showed bilat lower lobe nodules unchanged from 09/29/09 w/ recommendation for recheck in 6 months.   12/29/10- 45 yoF followed for sleep apnea, allergic rhinitis, hx of lung nodule. CPAP uncomfortable because it is overdrying her. At last visit we reduced her CPAP to 12 which did help. She wakes with retro-orbital pain and even nausea. The humidifier is insufficient. Saw Dr.TEOH for sinusitis 4-5 months ago and was treated with antibiotics. She noted the dryness then. Has been evaluated by Dr. Reynaldo Minium for sweating. 2 days ago, she blew "green chunks" out of her nose.  01/28/11- 89 yoF followed for sleep apnea, allergic rhinitis, hx of stable lung nodule. Had flu vax. Antibiotic last visit did clear sinusitis. She still experiences sinus pressure discomfort as she continues to use CPAP at least 4 hours every night, using a nasal mask. Sleep onset is easy, but she often wakes, restless and slept-out, around 1:30 -2:30 AM. Alprazolam some help, but may leave mild headache. We discussed alternatives. Husband is not attentive, but doesn't report break through snoring. Download did confirm good compliance and control at 11-12 cwp.  07/28/11- 59 yoF followed for sleep apnea, allergic rhinitis, hx of stable lung nodule, DMII Uses CPAP every night for approx 6-7 hours; pressure doing well for patient; has not taken card to [facility] for download-will take by today so we can get report. More  comfortable since pressure reduced to 10. Uses an occasional Sonata-5 mg is sufficient. Hip pain limits exercise. Dyspnea on exertion is primarily due to deconditioning. New diagnosis diabetes/ insulin dependent. CXR 01/06/11- IMPRESSION:  No evidence for acute cardiopulmonary abnormality.  Original Report Authenticated By: Glenice Bow, M.D.   03/20/12- 64 yoF followed for sleep apnea, allergic rhinitis, hx of stable lung nodule, DMII FOLLOWS FOR: uses CPAP every night. denies any problems with the machine or mask. CPAP 10/ [facility] [employer] the head showed good compliance and control on 10 CWP in May, 2013. We are looking for a more recent one. Bedtime between 10 and 11 PM comfortable sleep latency. She still tends to wake around 2:30 or 3 AM. Alprazolam at bedtime well. When Sonata was tried instead at time of wake, it would not put her back to sleep. No naps but tired by 2 PM.  // needs CXR for hx nodules, next visit//  10/26/12- 64 yoF Never smoker followed for sleep apnea/ CPAP, allergic rhinitis, hx of stable lung nodule, DMII FOLLOWS FOR: Wears CPAP every night for at least 6-7 hours; pressure working well for patient; needs new CPAP 10/ Advanced machine-has smell to it-? motor going bad per [facility]. Good compliance download.  10/29/13- 51 yoF Never smoker followed for sleep apnea/ CPAP, allergic rhinitis, hx of stable lung nodule, DMII FOLLOWS FOR: Wearing cpap 10/ Advanced 6+ hours nightly.  Denies any problems with pressure, mask, or supplies.   CXR 10/30/12-  IMPRESSION:  No active cardiopulmonary abnormality. Negative for lung nodule.  Original Report Authenticated By: Shanon Brow  Carlis Abbott, M.D.  Review of Systems-See HPI Constitutional:   No-   weight loss, , fevers, chills, fatigue, lassitude. HEENT:   Mild  headaches, No-difficulty swallowing, tooth/dental problems, sore throat,       No-  sneezing, itching, ear ache,   +nasal congestion, post nasal drip- improved,  CV:  No-    chest pain, orthopnea, PND, swelling in lower extremities, anasarca, dizziness, palpitations Resp: No-   shortness of breath with exertion or at rest.              No-   productive cough,  No non-productive cough,  No-  coughing up of blood.              No-   change in color of mucus.  No- wheezing.   Skin: No-   rash or lesions. GI:  No-   heartburn, indigestion, abdominal pain, nausea, vomiting,  GU: . MS:  No-   joint pain or swelling.  Neuro- nothing unusual:  Psych:  No- change in mood or affect. No depression or anxiety.  No memory loss.  Objective:   Physical Exam General- Alert, Oriented, Affect-appropriate, Distress- none acute, +overweight Skin- rash-none, lesions- none, excoriation- none Lymphadenopathy- none Head- atraumatic            Eyes- Gross vision intact, PERRLA, conjunctivae clear secretions            Ears- Hearing, canals-normal            Nose- turbinate edema, no-Septal dev, mucus, polyps, erosion, perforation             Throat- Mallampati III , mucosa clear , drainage- none, tonsils- atrophic Neck- flexible , trachea midline, no stridor , thyroid nl, carotid no bruit Chest - symmetrical excursion , unlabored           Heart/CV- RRR ,  +Murmur 1/6 systolic AS , no gallop  , no rub, nl s1 s2                           - JVD- none , edema- none, stasis changes- none, varices- none           Lung- clear to P&A, wheeze- none, cough- none , dullness-none, rub- none           Chest wall-  Abd-  Br/ Gen/ Rectal- Not done, not indicated Extrem- cyanosis- none, clubbing, none, atrophy- none, strength- nl Neuro- grossly intact to observation

## 2013-10-30 DIAGNOSIS — M25639 Stiffness of unspecified wrist, not elsewhere classified: Secondary | ICD-10-CM | POA: Diagnosis not present

## 2013-11-01 DIAGNOSIS — M25639 Stiffness of unspecified wrist, not elsewhere classified: Secondary | ICD-10-CM | POA: Diagnosis not present

## 2013-11-06 DIAGNOSIS — M25639 Stiffness of unspecified wrist, not elsewhere classified: Secondary | ICD-10-CM | POA: Diagnosis not present

## 2013-11-09 DIAGNOSIS — M25639 Stiffness of unspecified wrist, not elsewhere classified: Secondary | ICD-10-CM | POA: Diagnosis not present

## 2013-11-13 DIAGNOSIS — M25639 Stiffness of unspecified wrist, not elsewhere classified: Secondary | ICD-10-CM | POA: Diagnosis not present

## 2013-11-20 DIAGNOSIS — M25639 Stiffness of unspecified wrist, not elsewhere classified: Secondary | ICD-10-CM | POA: Diagnosis not present

## 2013-12-04 DIAGNOSIS — IMO0001 Reserved for inherently not codable concepts without codable children: Secondary | ICD-10-CM | POA: Diagnosis not present

## 2013-12-04 DIAGNOSIS — R030 Elevated blood-pressure reading, without diagnosis of hypertension: Secondary | ICD-10-CM | POA: Diagnosis not present

## 2013-12-04 DIAGNOSIS — Z6835 Body mass index (BMI) 35.0-35.9, adult: Secondary | ICD-10-CM | POA: Diagnosis not present

## 2013-12-17 DIAGNOSIS — M659 Synovitis and tenosynovitis, unspecified: Secondary | ICD-10-CM | POA: Diagnosis not present

## 2013-12-17 DIAGNOSIS — Z4789 Encounter for other orthopedic aftercare: Secondary | ICD-10-CM | POA: Diagnosis not present

## 2013-12-17 DIAGNOSIS — M25639 Stiffness of unspecified wrist, not elsewhere classified: Secondary | ICD-10-CM | POA: Diagnosis not present

## 2013-12-20 ENCOUNTER — Other Ambulatory Visit: Payer: Self-pay

## 2013-12-20 DIAGNOSIS — Z1231 Encounter for screening mammogram for malignant neoplasm of breast: Secondary | ICD-10-CM

## 2013-12-20 DIAGNOSIS — Z1239 Encounter for other screening for malignant neoplasm of breast: Secondary | ICD-10-CM

## 2013-12-25 DIAGNOSIS — E039 Hypothyroidism, unspecified: Secondary | ICD-10-CM | POA: Diagnosis not present

## 2013-12-25 DIAGNOSIS — E1165 Type 2 diabetes mellitus with hyperglycemia: Secondary | ICD-10-CM | POA: Diagnosis not present

## 2013-12-25 DIAGNOSIS — E785 Hyperlipidemia, unspecified: Secondary | ICD-10-CM | POA: Diagnosis not present

## 2013-12-28 DIAGNOSIS — E119 Type 2 diabetes mellitus without complications: Secondary | ICD-10-CM | POA: Diagnosis not present

## 2014-01-02 DIAGNOSIS — I10 Essential (primary) hypertension: Secondary | ICD-10-CM | POA: Diagnosis not present

## 2014-01-02 DIAGNOSIS — E119 Type 2 diabetes mellitus without complications: Secondary | ICD-10-CM | POA: Diagnosis not present

## 2014-01-02 DIAGNOSIS — Z6835 Body mass index (BMI) 35.0-35.9, adult: Secondary | ICD-10-CM | POA: Diagnosis not present

## 2014-01-02 DIAGNOSIS — E669 Obesity, unspecified: Secondary | ICD-10-CM | POA: Diagnosis not present

## 2014-01-02 DIAGNOSIS — Z1389 Encounter for screening for other disorder: Secondary | ICD-10-CM | POA: Diagnosis not present

## 2014-01-02 DIAGNOSIS — E785 Hyperlipidemia, unspecified: Secondary | ICD-10-CM | POA: Diagnosis not present

## 2014-01-02 DIAGNOSIS — Z23 Encounter for immunization: Secondary | ICD-10-CM | POA: Diagnosis not present

## 2014-01-02 DIAGNOSIS — Z008 Encounter for other general examination: Secondary | ICD-10-CM | POA: Diagnosis not present

## 2014-01-02 DIAGNOSIS — K760 Fatty (change of) liver, not elsewhere classified: Secondary | ICD-10-CM | POA: Diagnosis not present

## 2014-01-02 DIAGNOSIS — Z283 Underimmunization status: Secondary | ICD-10-CM | POA: Diagnosis not present

## 2014-01-03 DIAGNOSIS — Z1212 Encounter for screening for malignant neoplasm of rectum: Secondary | ICD-10-CM | POA: Diagnosis not present

## 2014-01-09 ENCOUNTER — Ambulatory Visit
Admission: RE | Admit: 2014-01-09 | Discharge: 2014-01-09 | Disposition: A | Discharge status: Home / Self Care | Payer: Medicare Other | Source: Ambulatory Visit

## 2014-01-09 DIAGNOSIS — Z1231 Encounter for screening mammogram for malignant neoplasm of breast: Secondary | ICD-10-CM | POA: Diagnosis not present

## 2014-01-10 DIAGNOSIS — H1013 Acute atopic conjunctivitis, bilateral: Secondary | ICD-10-CM | POA: Diagnosis not present

## 2014-01-29 DIAGNOSIS — M6289 Other specified disorders of muscle: Secondary | ICD-10-CM | POA: Diagnosis not present

## 2014-01-29 DIAGNOSIS — M797 Fibromyalgia: Secondary | ICD-10-CM | POA: Diagnosis not present

## 2014-01-29 DIAGNOSIS — F458 Other somatoform disorders: Secondary | ICD-10-CM | POA: Diagnosis not present

## 2014-01-29 DIAGNOSIS — M2669 Other specified disorders of temporomandibular joint: Secondary | ICD-10-CM | POA: Diagnosis not present

## 2014-02-04 DIAGNOSIS — M19041 Primary osteoarthritis, right hand: Secondary | ICD-10-CM | POA: Diagnosis not present

## 2014-02-04 DIAGNOSIS — M24532 Contracture, left wrist: Secondary | ICD-10-CM | POA: Diagnosis not present

## 2014-02-13 DIAGNOSIS — R030 Elevated blood-pressure reading, without diagnosis of hypertension: Secondary | ICD-10-CM | POA: Diagnosis not present

## 2014-02-13 DIAGNOSIS — Z6835 Body mass index (BMI) 35.0-35.9, adult: Secondary | ICD-10-CM | POA: Diagnosis not present

## 2014-02-13 DIAGNOSIS — E1165 Type 2 diabetes mellitus with hyperglycemia: Secondary | ICD-10-CM | POA: Diagnosis not present

## 2014-02-21 DIAGNOSIS — M65341 Trigger finger, right ring finger: Secondary | ICD-10-CM | POA: Diagnosis not present

## 2014-02-21 DIAGNOSIS — M65331 Trigger finger, right middle finger: Secondary | ICD-10-CM | POA: Diagnosis not present

## 2014-03-07 DIAGNOSIS — M65331 Trigger finger, right middle finger: Secondary | ICD-10-CM | POA: Diagnosis not present

## 2014-03-22 DIAGNOSIS — I639 Cerebral infarction, unspecified: Secondary | ICD-10-CM

## 2014-03-22 HISTORY — DX: Cerebral infarction, unspecified: I63.9

## 2014-03-25 DIAGNOSIS — Z4789 Encounter for other orthopedic aftercare: Secondary | ICD-10-CM | POA: Diagnosis not present

## 2014-03-25 DIAGNOSIS — M779 Enthesopathy, unspecified: Secondary | ICD-10-CM | POA: Diagnosis not present

## 2014-03-31 NOTE — Assessment & Plan Note (Signed)
Not seen on most recent imaging. Plan-continue surveillance at long intervals as appropriate.

## 2014-03-31 NOTE — Assessment & Plan Note (Signed)
Good compliance and control. The pressure seems good at 10/[facility]. Plan-request download from his DME company for pressure and compliance.

## 2014-04-11 DIAGNOSIS — M65842 Other synovitis and tenosynovitis, left hand: Secondary | ICD-10-CM | POA: Diagnosis not present

## 2014-04-11 DIAGNOSIS — M67932 Unspecified disorder of synovium and tendon, left forearm: Secondary | ICD-10-CM | POA: Diagnosis not present

## 2014-04-15 DIAGNOSIS — M779 Enthesopathy, unspecified: Secondary | ICD-10-CM | POA: Diagnosis not present

## 2014-04-15 DIAGNOSIS — M25632 Stiffness of left wrist, not elsewhere classified: Secondary | ICD-10-CM | POA: Diagnosis not present

## 2014-04-19 DIAGNOSIS — Z4789 Encounter for other orthopedic aftercare: Secondary | ICD-10-CM | POA: Diagnosis not present

## 2014-04-22 DIAGNOSIS — M25632 Stiffness of left wrist, not elsewhere classified: Secondary | ICD-10-CM | POA: Diagnosis not present

## 2014-04-22 DIAGNOSIS — M779 Enthesopathy, unspecified: Secondary | ICD-10-CM | POA: Diagnosis not present

## 2014-04-22 DIAGNOSIS — Z4789 Encounter for other orthopedic aftercare: Secondary | ICD-10-CM | POA: Diagnosis not present

## 2014-04-25 DIAGNOSIS — M25632 Stiffness of left wrist, not elsewhere classified: Secondary | ICD-10-CM | POA: Diagnosis not present

## 2014-04-29 DIAGNOSIS — M25632 Stiffness of left wrist, not elsewhere classified: Secondary | ICD-10-CM | POA: Diagnosis not present

## 2014-05-02 DIAGNOSIS — M25632 Stiffness of left wrist, not elsewhere classified: Secondary | ICD-10-CM | POA: Diagnosis not present

## 2014-05-09 DIAGNOSIS — M25632 Stiffness of left wrist, not elsewhere classified: Secondary | ICD-10-CM | POA: Diagnosis not present

## 2014-05-13 DIAGNOSIS — Z4789 Encounter for other orthopedic aftercare: Secondary | ICD-10-CM | POA: Diagnosis not present

## 2014-05-17 DIAGNOSIS — M25632 Stiffness of left wrist, not elsewhere classified: Secondary | ICD-10-CM | POA: Diagnosis not present

## 2014-05-20 DIAGNOSIS — M25632 Stiffness of left wrist, not elsewhere classified: Secondary | ICD-10-CM | POA: Diagnosis not present

## 2014-05-20 DIAGNOSIS — E669 Obesity, unspecified: Secondary | ICD-10-CM | POA: Diagnosis not present

## 2014-05-20 DIAGNOSIS — Z1389 Encounter for screening for other disorder: Secondary | ICD-10-CM | POA: Diagnosis not present

## 2014-05-20 DIAGNOSIS — Z6836 Body mass index (BMI) 36.0-36.9, adult: Secondary | ICD-10-CM | POA: Diagnosis not present

## 2014-05-20 DIAGNOSIS — E119 Type 2 diabetes mellitus without complications: Secondary | ICD-10-CM | POA: Diagnosis not present

## 2014-05-20 DIAGNOSIS — E785 Hyperlipidemia, unspecified: Secondary | ICD-10-CM | POA: Diagnosis not present

## 2014-05-20 DIAGNOSIS — F329 Major depressive disorder, single episode, unspecified: Secondary | ICD-10-CM | POA: Diagnosis not present

## 2014-05-20 DIAGNOSIS — E039 Hypothyroidism, unspecified: Secondary | ICD-10-CM | POA: Diagnosis not present

## 2014-05-20 DIAGNOSIS — M797 Fibromyalgia: Secondary | ICD-10-CM | POA: Diagnosis not present

## 2014-05-20 DIAGNOSIS — I10 Essential (primary) hypertension: Secondary | ICD-10-CM | POA: Diagnosis not present

## 2014-05-22 DIAGNOSIS — M25632 Stiffness of left wrist, not elsewhere classified: Secondary | ICD-10-CM | POA: Diagnosis not present

## 2014-05-27 DIAGNOSIS — Z4789 Encounter for other orthopedic aftercare: Secondary | ICD-10-CM | POA: Diagnosis not present

## 2014-05-27 DIAGNOSIS — M779 Enthesopathy, unspecified: Secondary | ICD-10-CM | POA: Diagnosis not present

## 2014-06-17 DIAGNOSIS — M542 Cervicalgia: Secondary | ICD-10-CM | POA: Diagnosis not present

## 2014-06-17 DIAGNOSIS — R6884 Jaw pain: Secondary | ICD-10-CM | POA: Diagnosis not present

## 2014-06-24 DIAGNOSIS — R6884 Jaw pain: Secondary | ICD-10-CM | POA: Diagnosis not present

## 2014-06-24 DIAGNOSIS — M542 Cervicalgia: Secondary | ICD-10-CM | POA: Diagnosis not present

## 2014-06-27 DIAGNOSIS — R6884 Jaw pain: Secondary | ICD-10-CM | POA: Diagnosis not present

## 2014-06-27 DIAGNOSIS — M542 Cervicalgia: Secondary | ICD-10-CM | POA: Diagnosis not present

## 2014-07-04 DIAGNOSIS — Z4789 Encounter for other orthopedic aftercare: Secondary | ICD-10-CM | POA: Diagnosis not present

## 2014-07-04 DIAGNOSIS — J209 Acute bronchitis, unspecified: Secondary | ICD-10-CM | POA: Diagnosis not present

## 2014-07-04 DIAGNOSIS — M779 Enthesopathy, unspecified: Secondary | ICD-10-CM | POA: Diagnosis not present

## 2014-07-04 DIAGNOSIS — R0602 Shortness of breath: Secondary | ICD-10-CM | POA: Diagnosis not present

## 2014-07-04 DIAGNOSIS — Z6835 Body mass index (BMI) 35.0-35.9, adult: Secondary | ICD-10-CM | POA: Diagnosis not present

## 2014-07-29 DIAGNOSIS — M542 Cervicalgia: Secondary | ICD-10-CM | POA: Diagnosis not present

## 2014-07-29 DIAGNOSIS — R6884 Jaw pain: Secondary | ICD-10-CM | POA: Diagnosis not present

## 2014-07-31 DIAGNOSIS — M542 Cervicalgia: Secondary | ICD-10-CM | POA: Diagnosis not present

## 2014-07-31 DIAGNOSIS — R6884 Jaw pain: Secondary | ICD-10-CM | POA: Diagnosis not present

## 2014-08-08 DIAGNOSIS — R6884 Jaw pain: Secondary | ICD-10-CM | POA: Diagnosis not present

## 2014-08-08 DIAGNOSIS — M542 Cervicalgia: Secondary | ICD-10-CM | POA: Diagnosis not present

## 2014-08-15 DIAGNOSIS — M779 Enthesopathy, unspecified: Secondary | ICD-10-CM | POA: Diagnosis not present

## 2014-08-15 DIAGNOSIS — Z4789 Encounter for other orthopedic aftercare: Secondary | ICD-10-CM | POA: Diagnosis not present

## 2014-08-21 DIAGNOSIS — I10 Essential (primary) hypertension: Secondary | ICD-10-CM | POA: Diagnosis not present

## 2014-08-21 DIAGNOSIS — E1165 Type 2 diabetes mellitus with hyperglycemia: Secondary | ICD-10-CM | POA: Diagnosis not present

## 2014-08-21 DIAGNOSIS — Z6835 Body mass index (BMI) 35.0-35.9, adult: Secondary | ICD-10-CM | POA: Diagnosis not present

## 2014-08-22 DIAGNOSIS — D1801 Hemangioma of skin and subcutaneous tissue: Secondary | ICD-10-CM | POA: Diagnosis not present

## 2014-08-22 DIAGNOSIS — L821 Other seborrheic keratosis: Secondary | ICD-10-CM | POA: Diagnosis not present

## 2014-08-22 DIAGNOSIS — L814 Other melanin hyperpigmentation: Secondary | ICD-10-CM | POA: Diagnosis not present

## 2014-08-22 DIAGNOSIS — D225 Melanocytic nevi of trunk: Secondary | ICD-10-CM | POA: Diagnosis not present

## 2014-09-16 ENCOUNTER — Other Ambulatory Visit: Payer: Self-pay

## 2014-10-04 DIAGNOSIS — E119 Type 2 diabetes mellitus without complications: Secondary | ICD-10-CM | POA: Diagnosis not present

## 2014-10-04 DIAGNOSIS — M797 Fibromyalgia: Secondary | ICD-10-CM | POA: Diagnosis not present

## 2014-10-04 DIAGNOSIS — E669 Obesity, unspecified: Secondary | ICD-10-CM | POA: Diagnosis not present

## 2014-10-04 DIAGNOSIS — E039 Hypothyroidism, unspecified: Secondary | ICD-10-CM | POA: Diagnosis not present

## 2014-10-04 DIAGNOSIS — I10 Essential (primary) hypertension: Secondary | ICD-10-CM | POA: Diagnosis not present

## 2014-10-04 DIAGNOSIS — Z6835 Body mass index (BMI) 35.0-35.9, adult: Secondary | ICD-10-CM | POA: Diagnosis not present

## 2014-10-04 DIAGNOSIS — E785 Hyperlipidemia, unspecified: Secondary | ICD-10-CM | POA: Diagnosis not present

## 2014-10-04 DIAGNOSIS — F329 Major depressive disorder, single episode, unspecified: Secondary | ICD-10-CM | POA: Diagnosis not present

## 2014-10-22 DIAGNOSIS — H6121 Impacted cerumen, right ear: Secondary | ICD-10-CM | POA: Diagnosis not present

## 2014-10-22 DIAGNOSIS — H9209 Otalgia, unspecified ear: Secondary | ICD-10-CM | POA: Diagnosis not present

## 2014-10-22 DIAGNOSIS — H903 Sensorineural hearing loss, bilateral: Secondary | ICD-10-CM | POA: Diagnosis not present

## 2014-10-31 ENCOUNTER — Ambulatory Visit (INDEPENDENT_AMBULATORY_CARE_PROVIDER_SITE_OTHER): Payer: Medicare Other | Admitting: Internal Medicine

## 2014-10-31 ENCOUNTER — Encounter: Payer: Self-pay | Admitting: Internal Medicine

## 2014-10-31 VITALS — BP 124/82 | HR 68 | Ht 64.0 in | Wt 209.0 lb

## 2014-10-31 DIAGNOSIS — G4733 Obstructive sleep apnea (adult) (pediatric): Secondary | ICD-10-CM | POA: Diagnosis not present

## 2014-10-31 DIAGNOSIS — G4700 Insomnia, unspecified: Secondary | ICD-10-CM | POA: Diagnosis not present

## 2014-10-31 DIAGNOSIS — F5104 Psychophysiologic insomnia: Secondary | ICD-10-CM

## 2014-10-31 NOTE — Progress Notes (Signed)
Patient ID: [name], female    DOB: Jan 13, 1946, 69 y.o.   MRN: [id number]  HPI 07/28/10- 64 yoF followed for sleep apnea, allergic rhinitis, hx of lung nodule. Last here March 8- reviewed.  She continues CPAP 13.  Good compliance and control. She changed to nasal pillows mask- Advanced- likes this mask better. She liked sample Nasonex but didn't need script.  Notes occasional mild wheeze and some chest tightness- questions if she might have a little asthma- never diagnosed. It doesn't restrict activity. CTa 11/27/09- showed bilat lower lobe nodules unchanged from 09/29/09 w/ recommendation for recheck in 6 months.   12/29/10- 45 yoF followed for sleep apnea, allergic rhinitis, hx of lung nodule. CPAP uncomfortable because it is overdrying her. At last visit we reduced her CPAP to 12 which did help. She wakes with retro-orbital pain and even nausea. The humidifier is insufficient. Saw Dr.TEOH for sinusitis 4-5 months ago and was treated with antibiotics. She noted the dryness then. Has been evaluated by Dr. Reynaldo Minium for sweating. 2 days ago, she blew "green chunks" out of her nose.  01/28/11- 89 yoF followed for sleep apnea, allergic rhinitis, hx of stable lung nodule. Had flu vax. Antibiotic last visit did clear sinusitis. She still experiences sinus pressure discomfort as she continues to use CPAP at least 4 hours every night, using a nasal mask. Sleep onset is easy, but she often wakes, restless and slept-out, around 1:30 -2:30 AM. Alprazolam some help, but may leave mild headache. We discussed alternatives. Husband is not attentive, but doesn't report break through snoring. Download did confirm good compliance and control at 11-12 cwp.  07/28/11- 59 yoF followed for sleep apnea, allergic rhinitis, hx of stable lung nodule, DMII Uses CPAP every night for approx 6-7 hours; pressure doing well for patient; has not taken card to [facility] for download-will take by today so we can get report. More  comfortable since pressure reduced to 10. Uses an occasional Sonata-5 mg is sufficient. Hip pain limits exercise. Dyspnea on exertion is primarily due to deconditioning. New diagnosis diabetes/ insulin dependent. CXR 01/06/11- IMPRESSION:  No evidence for acute cardiopulmonary abnormality.  Original Report Authenticated By: Glenice Bow, M.D.   03/20/12- 64 yoF followed for sleep apnea, allergic rhinitis, hx of stable lung nodule, DMII FOLLOWS FOR: uses CPAP every night. denies any problems with the machine or mask. CPAP 10/ [facility] [employer] the head showed good compliance and control on 10 CWP in May, 2013. We are looking for a more recent one. Bedtime between 10 and 11 PM comfortable sleep latency. She still tends to wake around 2:30 or 3 AM. Alprazolam at bedtime well. When Sonata was tried instead at time of wake, it would not put her back to sleep. No naps but tired by 2 PM.  // needs CXR for hx nodules, next visit//  10/26/12- 64 yoF Never smoker followed for sleep apnea/ CPAP, allergic rhinitis, hx of stable lung nodule, DMII FOLLOWS FOR: Wears CPAP every night for at least 6-7 hours; pressure working well for patient; needs new CPAP 10/ Advanced machine-has smell to it-? motor going bad per [facility]. Good compliance download.  10/29/13- 51 yoF Never smoker followed for sleep apnea/ CPAP, allergic rhinitis, hx of stable lung nodule, DMII FOLLOWS FOR: Wearing cpap 10/ Advanced 6+ hours nightly.  Denies any problems with pressure, mask, or supplies.   CXR 10/30/12-  IMPRESSION:  No active cardiopulmonary abnormality. Negative for lung nodule.  Original Report Authenticated By: Shanon Brow  Carlis Abbott, M.D.  10/31/14- 39 yoF Never smoker followed for sleep apnea/ CPAP, allergic rhinitis, hx of stable lung nodule, complicated by DMII Follows For: Uses CPAP an average of 6 hrs nightly, mask fits well. Pt states no new complaints.  Doing very well with CPAP 10/Advanced. Good download. Prefers  alprazolam 0.5 mg at bedtime provided by her primary physician and used most nights.  Review of Systems-See HPI Constitutional:   No-   weight loss, , fevers, chills, fatigue, lassitude. HEENT:   Mild  headaches, No-difficulty swallowing, tooth/dental problems, sore throat,       No-  sneezing, itching, ear ache,   +nasal congestion, post nasal drip- improved,  CV:  No-   chest pain, orthopnea, PND, swelling in lower extremities, anasarca, dizziness, palpitations Resp: No-   shortness of breath with exertion or at rest.              No-   productive cough,  No non-productive cough,  No-  coughing up of blood.              No-   change in color of mucus.  No- wheezing.   Skin: No-   rash or lesions. GI:  No-   heartburn, indigestion, abdominal pain, nausea, vomiting,  GU: . MS:  No-   joint pain or swelling.  Neuro- nothing unusual:  Psych:  No- change in mood or affect. No depression or anxiety.  No memory loss.  Objective:   Physical Exam General- Alert, Oriented, Affect-appropriate, Distress- none acute, +overweight Skin- rash-none, lesions- none, excoriation- none Lymphadenopathy- none Head- atraumatic            Eyes- Gross vision intact, PERRLA, conjunctivae clear secretions            Ears- Hearing, canals-normal            Nose- turbinate edema, no-Septal dev, mucus, polyps, erosion, perforation             Throat- Mallampati III , mucosa clear , drainage- none, tonsils- atrophic Neck- flexible , trachea midline, no stridor , thyroid nl, carotid no bruit Chest - symmetrical excursion , unlabored           Heart/CV- RRR ,  +Murmur 1/6 systolic AS , no gallop  , no rub, nl s1 s2                           - JVD- none , edema- none, stasis changes- none, varices- none           Lung- clear to P&A, wheeze- none, cough- none , dullness-none, rub- none           Chest wall-  Abd-  Br/ Gen/ Rectal- Not done, not indicated Extrem- cyanosis- none, clubbing, none, atrophy- none,  strength- nl Neuro- grossly intact to observation

## 2014-10-31 NOTE — Patient Instructions (Signed)
We can continue CPAP 10/ Advanced  Please call if we can help 

## 2014-11-03 NOTE — Assessment & Plan Note (Signed)
She is pleased with the way Xanax 0.5 mg works when needed for sleep.Dr. Reynaldo Minium provides this. She prefers it over previous alternatives.

## 2014-11-03 NOTE — Assessment & Plan Note (Signed)
Download confirms very good compliance and control with CPAP 10/[facility]. Weight loss encouraged.

## 2014-11-20 DIAGNOSIS — G5602 Carpal tunnel syndrome, left upper limb: Secondary | ICD-10-CM | POA: Diagnosis not present

## 2014-11-20 DIAGNOSIS — Z6835 Body mass index (BMI) 35.0-35.9, adult: Secondary | ICD-10-CM | POA: Diagnosis not present

## 2014-11-20 DIAGNOSIS — M47812 Spondylosis without myelopathy or radiculopathy, cervical region: Secondary | ICD-10-CM | POA: Diagnosis not present

## 2014-11-20 DIAGNOSIS — I10 Essential (primary) hypertension: Secondary | ICD-10-CM | POA: Diagnosis not present

## 2014-11-20 DIAGNOSIS — M67834 Other specified disorders of tendon, left wrist: Secondary | ICD-10-CM | POA: Diagnosis not present

## 2014-11-20 DIAGNOSIS — E1165 Type 2 diabetes mellitus with hyperglycemia: Secondary | ICD-10-CM | POA: Diagnosis not present

## 2014-11-20 DIAGNOSIS — M24132 Other articular cartilage disorders, left wrist: Secondary | ICD-10-CM | POA: Diagnosis not present

## 2014-11-20 DIAGNOSIS — M67844 Other specified disorders of tendon, left hand: Secondary | ICD-10-CM | POA: Diagnosis not present

## 2014-11-29 DIAGNOSIS — M79642 Pain in left hand: Secondary | ICD-10-CM | POA: Diagnosis not present

## 2014-11-29 DIAGNOSIS — M25642 Stiffness of left hand, not elsewhere classified: Secondary | ICD-10-CM | POA: Diagnosis not present

## 2014-11-29 DIAGNOSIS — L905 Scar conditions and fibrosis of skin: Secondary | ICD-10-CM | POA: Diagnosis not present

## 2014-11-29 DIAGNOSIS — M25632 Stiffness of left wrist, not elsewhere classified: Secondary | ICD-10-CM | POA: Diagnosis not present

## 2014-12-02 DIAGNOSIS — M25642 Stiffness of left hand, not elsewhere classified: Secondary | ICD-10-CM | POA: Diagnosis not present

## 2014-12-02 DIAGNOSIS — M79642 Pain in left hand: Secondary | ICD-10-CM | POA: Diagnosis not present

## 2014-12-02 DIAGNOSIS — L905 Scar conditions and fibrosis of skin: Secondary | ICD-10-CM | POA: Diagnosis not present

## 2014-12-02 DIAGNOSIS — M25632 Stiffness of left wrist, not elsewhere classified: Secondary | ICD-10-CM | POA: Diagnosis not present

## 2014-12-05 DIAGNOSIS — M5412 Radiculopathy, cervical region: Secondary | ICD-10-CM | POA: Diagnosis not present

## 2014-12-06 DIAGNOSIS — M25642 Stiffness of left hand, not elsewhere classified: Secondary | ICD-10-CM | POA: Diagnosis not present

## 2014-12-06 DIAGNOSIS — M79642 Pain in left hand: Secondary | ICD-10-CM | POA: Diagnosis not present

## 2014-12-06 DIAGNOSIS — L905 Scar conditions and fibrosis of skin: Secondary | ICD-10-CM | POA: Diagnosis not present

## 2014-12-06 DIAGNOSIS — M25632 Stiffness of left wrist, not elsewhere classified: Secondary | ICD-10-CM | POA: Diagnosis not present

## 2014-12-09 DIAGNOSIS — M25642 Stiffness of left hand, not elsewhere classified: Secondary | ICD-10-CM | POA: Diagnosis not present

## 2014-12-09 DIAGNOSIS — M25632 Stiffness of left wrist, not elsewhere classified: Secondary | ICD-10-CM | POA: Diagnosis not present

## 2014-12-09 DIAGNOSIS — L905 Scar conditions and fibrosis of skin: Secondary | ICD-10-CM | POA: Diagnosis not present

## 2014-12-09 DIAGNOSIS — M79642 Pain in left hand: Secondary | ICD-10-CM | POA: Diagnosis not present

## 2014-12-11 DIAGNOSIS — M5012 Cervical disc disorder with radiculopathy, mid-cervical region: Secondary | ICD-10-CM | POA: Diagnosis not present

## 2014-12-11 DIAGNOSIS — M2669 Other specified disorders of temporomandibular joint: Secondary | ICD-10-CM | POA: Diagnosis not present

## 2014-12-11 DIAGNOSIS — M2662 Arthralgia of temporomandibular joint: Secondary | ICD-10-CM | POA: Diagnosis not present

## 2014-12-11 DIAGNOSIS — M4722 Other spondylosis with radiculopathy, cervical region: Secondary | ICD-10-CM | POA: Diagnosis not present

## 2014-12-13 DIAGNOSIS — M25632 Stiffness of left wrist, not elsewhere classified: Secondary | ICD-10-CM | POA: Diagnosis not present

## 2014-12-13 DIAGNOSIS — M79642 Pain in left hand: Secondary | ICD-10-CM | POA: Diagnosis not present

## 2014-12-13 DIAGNOSIS — M25642 Stiffness of left hand, not elsewhere classified: Secondary | ICD-10-CM | POA: Diagnosis not present

## 2014-12-13 DIAGNOSIS — L905 Scar conditions and fibrosis of skin: Secondary | ICD-10-CM | POA: Diagnosis not present

## 2014-12-17 DIAGNOSIS — M5412 Radiculopathy, cervical region: Secondary | ICD-10-CM | POA: Diagnosis not present

## 2014-12-19 DIAGNOSIS — L905 Scar conditions and fibrosis of skin: Secondary | ICD-10-CM | POA: Diagnosis not present

## 2014-12-19 DIAGNOSIS — M79642 Pain in left hand: Secondary | ICD-10-CM | POA: Diagnosis not present

## 2014-12-19 DIAGNOSIS — M25632 Stiffness of left wrist, not elsewhere classified: Secondary | ICD-10-CM | POA: Diagnosis not present

## 2014-12-19 DIAGNOSIS — M25642 Stiffness of left hand, not elsewhere classified: Secondary | ICD-10-CM | POA: Diagnosis not present

## 2014-12-24 DIAGNOSIS — L905 Scar conditions and fibrosis of skin: Secondary | ICD-10-CM | POA: Diagnosis not present

## 2014-12-24 DIAGNOSIS — M79642 Pain in left hand: Secondary | ICD-10-CM | POA: Diagnosis not present

## 2014-12-24 DIAGNOSIS — M25632 Stiffness of left wrist, not elsewhere classified: Secondary | ICD-10-CM | POA: Diagnosis not present

## 2014-12-24 DIAGNOSIS — M25642 Stiffness of left hand, not elsewhere classified: Secondary | ICD-10-CM | POA: Diagnosis not present

## 2014-12-26 DIAGNOSIS — L905 Scar conditions and fibrosis of skin: Secondary | ICD-10-CM | POA: Diagnosis not present

## 2014-12-26 DIAGNOSIS — M25632 Stiffness of left wrist, not elsewhere classified: Secondary | ICD-10-CM | POA: Diagnosis not present

## 2014-12-26 DIAGNOSIS — M79642 Pain in left hand: Secondary | ICD-10-CM | POA: Diagnosis not present

## 2014-12-26 DIAGNOSIS — M25642 Stiffness of left hand, not elsewhere classified: Secondary | ICD-10-CM | POA: Diagnosis not present

## 2015-01-01 DIAGNOSIS — M79642 Pain in left hand: Secondary | ICD-10-CM | POA: Diagnosis not present

## 2015-01-01 DIAGNOSIS — M25642 Stiffness of left hand, not elsewhere classified: Secondary | ICD-10-CM | POA: Diagnosis not present

## 2015-01-01 DIAGNOSIS — M25632 Stiffness of left wrist, not elsewhere classified: Secondary | ICD-10-CM | POA: Diagnosis not present

## 2015-01-01 DIAGNOSIS — L905 Scar conditions and fibrosis of skin: Secondary | ICD-10-CM | POA: Diagnosis not present

## 2015-01-03 DIAGNOSIS — M25642 Stiffness of left hand, not elsewhere classified: Secondary | ICD-10-CM | POA: Diagnosis not present

## 2015-01-03 DIAGNOSIS — M25632 Stiffness of left wrist, not elsewhere classified: Secondary | ICD-10-CM | POA: Diagnosis not present

## 2015-01-03 DIAGNOSIS — M79642 Pain in left hand: Secondary | ICD-10-CM | POA: Diagnosis not present

## 2015-01-03 DIAGNOSIS — L905 Scar conditions and fibrosis of skin: Secondary | ICD-10-CM | POA: Diagnosis not present

## 2015-01-06 DIAGNOSIS — E785 Hyperlipidemia, unspecified: Secondary | ICD-10-CM | POA: Diagnosis not present

## 2015-01-06 DIAGNOSIS — R8290 Unspecified abnormal findings in urine: Secondary | ICD-10-CM | POA: Diagnosis not present

## 2015-01-06 DIAGNOSIS — N390 Urinary tract infection, site not specified: Secondary | ICD-10-CM | POA: Diagnosis not present

## 2015-01-06 DIAGNOSIS — I10 Essential (primary) hypertension: Secondary | ICD-10-CM | POA: Diagnosis not present

## 2015-01-06 DIAGNOSIS — E1165 Type 2 diabetes mellitus with hyperglycemia: Secondary | ICD-10-CM | POA: Diagnosis not present

## 2015-01-06 DIAGNOSIS — E039 Hypothyroidism, unspecified: Secondary | ICD-10-CM | POA: Diagnosis not present

## 2015-01-07 DIAGNOSIS — L905 Scar conditions and fibrosis of skin: Secondary | ICD-10-CM | POA: Diagnosis not present

## 2015-01-07 DIAGNOSIS — M25642 Stiffness of left hand, not elsewhere classified: Secondary | ICD-10-CM | POA: Diagnosis not present

## 2015-01-07 DIAGNOSIS — M79642 Pain in left hand: Secondary | ICD-10-CM | POA: Diagnosis not present

## 2015-01-07 DIAGNOSIS — M25632 Stiffness of left wrist, not elsewhere classified: Secondary | ICD-10-CM | POA: Diagnosis not present

## 2015-01-10 DIAGNOSIS — M25632 Stiffness of left wrist, not elsewhere classified: Secondary | ICD-10-CM | POA: Diagnosis not present

## 2015-01-10 DIAGNOSIS — M79642 Pain in left hand: Secondary | ICD-10-CM | POA: Diagnosis not present

## 2015-01-10 DIAGNOSIS — M25642 Stiffness of left hand, not elsewhere classified: Secondary | ICD-10-CM | POA: Diagnosis not present

## 2015-01-10 DIAGNOSIS — L905 Scar conditions and fibrosis of skin: Secondary | ICD-10-CM | POA: Diagnosis not present

## 2015-01-13 DIAGNOSIS — Z23 Encounter for immunization: Secondary | ICD-10-CM | POA: Diagnosis not present

## 2015-01-13 DIAGNOSIS — Z1389 Encounter for screening for other disorder: Secondary | ICD-10-CM | POA: Diagnosis not present

## 2015-01-13 DIAGNOSIS — M797 Fibromyalgia: Secondary | ICD-10-CM | POA: Diagnosis not present

## 2015-01-13 DIAGNOSIS — Z6835 Body mass index (BMI) 35.0-35.9, adult: Secondary | ICD-10-CM | POA: Diagnosis not present

## 2015-01-13 DIAGNOSIS — F329 Major depressive disorder, single episode, unspecified: Secondary | ICD-10-CM | POA: Diagnosis not present

## 2015-01-13 DIAGNOSIS — Z0000 Encounter for general adult medical examination without abnormal findings: Secondary | ICD-10-CM | POA: Diagnosis not present

## 2015-01-13 DIAGNOSIS — E669 Obesity, unspecified: Secondary | ICD-10-CM | POA: Diagnosis not present

## 2015-01-13 DIAGNOSIS — E785 Hyperlipidemia, unspecified: Secondary | ICD-10-CM | POA: Diagnosis not present

## 2015-01-13 DIAGNOSIS — E039 Hypothyroidism, unspecified: Secondary | ICD-10-CM | POA: Diagnosis not present

## 2015-01-13 DIAGNOSIS — E119 Type 2 diabetes mellitus without complications: Secondary | ICD-10-CM | POA: Diagnosis not present

## 2015-01-13 DIAGNOSIS — I10 Essential (primary) hypertension: Secondary | ICD-10-CM | POA: Diagnosis not present

## 2015-01-13 DIAGNOSIS — Z1212 Encounter for screening for malignant neoplasm of rectum: Secondary | ICD-10-CM | POA: Diagnosis not present

## 2015-01-21 DIAGNOSIS — L905 Scar conditions and fibrosis of skin: Secondary | ICD-10-CM | POA: Diagnosis not present

## 2015-01-21 DIAGNOSIS — M25632 Stiffness of left wrist, not elsewhere classified: Secondary | ICD-10-CM | POA: Diagnosis not present

## 2015-01-21 DIAGNOSIS — M79642 Pain in left hand: Secondary | ICD-10-CM | POA: Diagnosis not present

## 2015-01-21 DIAGNOSIS — M25642 Stiffness of left hand, not elsewhere classified: Secondary | ICD-10-CM | POA: Diagnosis not present

## 2015-01-22 ENCOUNTER — Other Ambulatory Visit: Payer: Self-pay

## 2015-01-22 DIAGNOSIS — Z1231 Encounter for screening mammogram for malignant neoplasm of breast: Secondary | ICD-10-CM

## 2015-01-29 DIAGNOSIS — M25642 Stiffness of left hand, not elsewhere classified: Secondary | ICD-10-CM | POA: Diagnosis not present

## 2015-01-29 DIAGNOSIS — L905 Scar conditions and fibrosis of skin: Secondary | ICD-10-CM | POA: Diagnosis not present

## 2015-01-29 DIAGNOSIS — M79642 Pain in left hand: Secondary | ICD-10-CM | POA: Diagnosis not present

## 2015-01-29 DIAGNOSIS — M25632 Stiffness of left wrist, not elsewhere classified: Secondary | ICD-10-CM | POA: Diagnosis not present

## 2015-02-03 DIAGNOSIS — R42 Dizziness and giddiness: Secondary | ICD-10-CM | POA: Diagnosis not present

## 2015-02-03 DIAGNOSIS — Z6834 Body mass index (BMI) 34.0-34.9, adult: Secondary | ICD-10-CM | POA: Diagnosis not present

## 2015-02-03 DIAGNOSIS — M797 Fibromyalgia: Secondary | ICD-10-CM | POA: Diagnosis not present

## 2015-02-03 DIAGNOSIS — R269 Unspecified abnormalities of gait and mobility: Secondary | ICD-10-CM | POA: Diagnosis not present

## 2015-02-03 DIAGNOSIS — E119 Type 2 diabetes mellitus without complications: Secondary | ICD-10-CM | POA: Diagnosis not present

## 2015-02-03 DIAGNOSIS — E669 Obesity, unspecified: Secondary | ICD-10-CM | POA: Diagnosis not present

## 2015-02-04 ENCOUNTER — Other Ambulatory Visit (HOSPITAL_COMMUNITY): Payer: Self-pay | Admitting: Internal Medicine

## 2015-02-04 ENCOUNTER — Ambulatory Visit: Payer: No Typology Code available for payment source

## 2015-02-04 ENCOUNTER — Ambulatory Visit (HOSPITAL_COMMUNITY)
Admission: RE | Admit: 2015-02-04 | Discharge: 2015-02-04 | Disposition: A | Discharge status: Home / Self Care | Payer: Medicare Other | Source: Ambulatory Visit | Attending: Internal Medicine | Admitting: Internal Medicine

## 2015-02-04 DIAGNOSIS — H9319 Tinnitus, unspecified ear: Secondary | ICD-10-CM | POA: Insufficient documentation

## 2015-02-04 DIAGNOSIS — R269 Unspecified abnormalities of gait and mobility: Secondary | ICD-10-CM | POA: Insufficient documentation

## 2015-02-04 DIAGNOSIS — M779 Enthesopathy, unspecified: Secondary | ICD-10-CM | POA: Diagnosis not present

## 2015-02-04 DIAGNOSIS — R42 Dizziness and giddiness: Secondary | ICD-10-CM

## 2015-02-05 ENCOUNTER — Ambulatory Visit (HOSPITAL_COMMUNITY): Admission: RE | Admit: 2015-02-05 | Payer: Medicare Other | Source: Ambulatory Visit

## 2015-02-06 DIAGNOSIS — E119 Type 2 diabetes mellitus without complications: Secondary | ICD-10-CM | POA: Diagnosis not present

## 2015-02-11 ENCOUNTER — Ambulatory Visit
Admission: RE | Admit: 2015-02-11 | Discharge: 2015-02-11 | Disposition: A | Discharge status: Home / Self Care | Payer: Medicare Other | Source: Ambulatory Visit

## 2015-02-11 DIAGNOSIS — Z1231 Encounter for screening mammogram for malignant neoplasm of breast: Secondary | ICD-10-CM

## 2015-02-20 DIAGNOSIS — E1165 Type 2 diabetes mellitus with hyperglycemia: Secondary | ICD-10-CM | POA: Diagnosis not present

## 2015-02-20 DIAGNOSIS — I10 Essential (primary) hypertension: Secondary | ICD-10-CM | POA: Diagnosis not present

## 2015-02-20 DIAGNOSIS — Z6833 Body mass index (BMI) 33.0-33.9, adult: Secondary | ICD-10-CM | POA: Diagnosis not present

## 2015-03-04 DIAGNOSIS — M779 Enthesopathy, unspecified: Secondary | ICD-10-CM | POA: Diagnosis not present

## 2015-03-04 DIAGNOSIS — M25632 Stiffness of left wrist, not elsewhere classified: Secondary | ICD-10-CM | POA: Diagnosis not present

## 2015-03-18 DIAGNOSIS — L0292 Furuncle, unspecified: Secondary | ICD-10-CM | POA: Diagnosis not present

## 2015-03-18 DIAGNOSIS — Z6834 Body mass index (BMI) 34.0-34.9, adult: Secondary | ICD-10-CM | POA: Diagnosis not present

## 2015-03-18 DIAGNOSIS — H9201 Otalgia, right ear: Secondary | ICD-10-CM | POA: Diagnosis not present

## 2015-03-18 DIAGNOSIS — R05 Cough: Secondary | ICD-10-CM | POA: Diagnosis not present

## 2015-05-01 DIAGNOSIS — S90852A Superficial foreign body, left foot, initial encounter: Secondary | ICD-10-CM | POA: Diagnosis not present

## 2015-05-14 DIAGNOSIS — Z1389 Encounter for screening for other disorder: Secondary | ICD-10-CM | POA: Diagnosis not present

## 2015-05-14 DIAGNOSIS — R42 Dizziness and giddiness: Secondary | ICD-10-CM | POA: Diagnosis not present

## 2015-05-14 DIAGNOSIS — F329 Major depressive disorder, single episode, unspecified: Secondary | ICD-10-CM | POA: Diagnosis not present

## 2015-05-14 DIAGNOSIS — E038 Other specified hypothyroidism: Secondary | ICD-10-CM | POA: Diagnosis not present

## 2015-05-14 DIAGNOSIS — E784 Other hyperlipidemia: Secondary | ICD-10-CM | POA: Diagnosis not present

## 2015-05-14 DIAGNOSIS — R269 Unspecified abnormalities of gait and mobility: Secondary | ICD-10-CM | POA: Diagnosis not present

## 2015-05-14 DIAGNOSIS — E1165 Type 2 diabetes mellitus with hyperglycemia: Secondary | ICD-10-CM | POA: Diagnosis not present

## 2015-05-14 DIAGNOSIS — I10 Essential (primary) hypertension: Secondary | ICD-10-CM | POA: Diagnosis not present

## 2015-05-14 DIAGNOSIS — Z6835 Body mass index (BMI) 35.0-35.9, adult: Secondary | ICD-10-CM | POA: Diagnosis not present

## 2015-05-14 DIAGNOSIS — K760 Fatty (change of) liver, not elsewhere classified: Secondary | ICD-10-CM | POA: Diagnosis not present

## 2015-05-14 DIAGNOSIS — M797 Fibromyalgia: Secondary | ICD-10-CM | POA: Diagnosis not present

## 2015-05-14 DIAGNOSIS — E668 Other obesity: Secondary | ICD-10-CM | POA: Diagnosis not present

## 2015-06-30 DIAGNOSIS — M25551 Pain in right hip: Secondary | ICD-10-CM | POA: Diagnosis not present

## 2015-06-30 DIAGNOSIS — M5136 Other intervertebral disc degeneration, lumbar region: Secondary | ICD-10-CM | POA: Diagnosis not present

## 2015-06-30 DIAGNOSIS — M25561 Pain in right knee: Secondary | ICD-10-CM | POA: Diagnosis not present

## 2015-07-07 DIAGNOSIS — M25651 Stiffness of right hip, not elsewhere classified: Secondary | ICD-10-CM | POA: Diagnosis not present

## 2015-07-07 DIAGNOSIS — M25451 Effusion, right hip: Secondary | ICD-10-CM | POA: Diagnosis not present

## 2015-07-07 DIAGNOSIS — R2681 Unsteadiness on feet: Secondary | ICD-10-CM | POA: Diagnosis not present

## 2015-07-07 DIAGNOSIS — M25551 Pain in right hip: Secondary | ICD-10-CM | POA: Diagnosis not present

## 2015-07-09 DIAGNOSIS — R2681 Unsteadiness on feet: Secondary | ICD-10-CM | POA: Diagnosis not present

## 2015-07-09 DIAGNOSIS — M25651 Stiffness of right hip, not elsewhere classified: Secondary | ICD-10-CM | POA: Diagnosis not present

## 2015-07-09 DIAGNOSIS — M25551 Pain in right hip: Secondary | ICD-10-CM | POA: Diagnosis not present

## 2015-07-09 DIAGNOSIS — M25451 Effusion, right hip: Secondary | ICD-10-CM | POA: Diagnosis not present

## 2015-07-11 DIAGNOSIS — M25551 Pain in right hip: Secondary | ICD-10-CM | POA: Diagnosis not present

## 2015-07-11 DIAGNOSIS — R2681 Unsteadiness on feet: Secondary | ICD-10-CM | POA: Diagnosis not present

## 2015-07-11 DIAGNOSIS — M25651 Stiffness of right hip, not elsewhere classified: Secondary | ICD-10-CM | POA: Diagnosis not present

## 2015-07-11 DIAGNOSIS — M25451 Effusion, right hip: Secondary | ICD-10-CM | POA: Diagnosis not present

## 2015-07-14 DIAGNOSIS — M25651 Stiffness of right hip, not elsewhere classified: Secondary | ICD-10-CM | POA: Diagnosis not present

## 2015-07-14 DIAGNOSIS — M25551 Pain in right hip: Secondary | ICD-10-CM | POA: Diagnosis not present

## 2015-07-14 DIAGNOSIS — R2681 Unsteadiness on feet: Secondary | ICD-10-CM | POA: Diagnosis not present

## 2015-07-14 DIAGNOSIS — M25451 Effusion, right hip: Secondary | ICD-10-CM | POA: Diagnosis not present

## 2015-07-16 DIAGNOSIS — M25451 Effusion, right hip: Secondary | ICD-10-CM | POA: Diagnosis not present

## 2015-07-16 DIAGNOSIS — R2681 Unsteadiness on feet: Secondary | ICD-10-CM | POA: Diagnosis not present

## 2015-07-16 DIAGNOSIS — M25651 Stiffness of right hip, not elsewhere classified: Secondary | ICD-10-CM | POA: Diagnosis not present

## 2015-07-16 DIAGNOSIS — M25551 Pain in right hip: Secondary | ICD-10-CM | POA: Diagnosis not present

## 2015-07-21 DIAGNOSIS — M25651 Stiffness of right hip, not elsewhere classified: Secondary | ICD-10-CM | POA: Diagnosis not present

## 2015-07-21 DIAGNOSIS — R2681 Unsteadiness on feet: Secondary | ICD-10-CM | POA: Diagnosis not present

## 2015-07-21 DIAGNOSIS — M25451 Effusion, right hip: Secondary | ICD-10-CM | POA: Diagnosis not present

## 2015-07-21 DIAGNOSIS — M25551 Pain in right hip: Secondary | ICD-10-CM | POA: Diagnosis not present

## 2015-07-22 DIAGNOSIS — E1165 Type 2 diabetes mellitus with hyperglycemia: Secondary | ICD-10-CM | POA: Diagnosis not present

## 2015-07-22 DIAGNOSIS — Z6835 Body mass index (BMI) 35.0-35.9, adult: Secondary | ICD-10-CM | POA: Diagnosis not present

## 2015-07-22 DIAGNOSIS — I10 Essential (primary) hypertension: Secondary | ICD-10-CM | POA: Diagnosis not present

## 2015-07-23 DIAGNOSIS — M25551 Pain in right hip: Secondary | ICD-10-CM | POA: Diagnosis not present

## 2015-07-23 DIAGNOSIS — M25651 Stiffness of right hip, not elsewhere classified: Secondary | ICD-10-CM | POA: Diagnosis not present

## 2015-07-23 DIAGNOSIS — M25451 Effusion, right hip: Secondary | ICD-10-CM | POA: Diagnosis not present

## 2015-07-23 DIAGNOSIS — R2681 Unsteadiness on feet: Secondary | ICD-10-CM | POA: Diagnosis not present

## 2015-07-25 DIAGNOSIS — M25651 Stiffness of right hip, not elsewhere classified: Secondary | ICD-10-CM | POA: Diagnosis not present

## 2015-07-25 DIAGNOSIS — R2681 Unsteadiness on feet: Secondary | ICD-10-CM | POA: Diagnosis not present

## 2015-07-25 DIAGNOSIS — M25451 Effusion, right hip: Secondary | ICD-10-CM | POA: Diagnosis not present

## 2015-07-25 DIAGNOSIS — M25551 Pain in right hip: Secondary | ICD-10-CM | POA: Diagnosis not present

## 2015-07-28 DIAGNOSIS — M25451 Effusion, right hip: Secondary | ICD-10-CM | POA: Diagnosis not present

## 2015-07-28 DIAGNOSIS — M25551 Pain in right hip: Secondary | ICD-10-CM | POA: Diagnosis not present

## 2015-07-28 DIAGNOSIS — M25651 Stiffness of right hip, not elsewhere classified: Secondary | ICD-10-CM | POA: Diagnosis not present

## 2015-07-28 DIAGNOSIS — R2681 Unsteadiness on feet: Secondary | ICD-10-CM | POA: Diagnosis not present

## 2015-07-30 DIAGNOSIS — M25551 Pain in right hip: Secondary | ICD-10-CM | POA: Diagnosis not present

## 2015-07-30 DIAGNOSIS — R2681 Unsteadiness on feet: Secondary | ICD-10-CM | POA: Diagnosis not present

## 2015-07-30 DIAGNOSIS — M25451 Effusion, right hip: Secondary | ICD-10-CM | POA: Diagnosis not present

## 2015-07-30 DIAGNOSIS — M25651 Stiffness of right hip, not elsewhere classified: Secondary | ICD-10-CM | POA: Diagnosis not present

## 2015-08-04 DIAGNOSIS — M25651 Stiffness of right hip, not elsewhere classified: Secondary | ICD-10-CM | POA: Diagnosis not present

## 2015-08-04 DIAGNOSIS — M25451 Effusion, right hip: Secondary | ICD-10-CM | POA: Diagnosis not present

## 2015-08-04 DIAGNOSIS — R2681 Unsteadiness on feet: Secondary | ICD-10-CM | POA: Diagnosis not present

## 2015-08-04 DIAGNOSIS — M25551 Pain in right hip: Secondary | ICD-10-CM | POA: Diagnosis not present

## 2015-08-06 DIAGNOSIS — M25451 Effusion, right hip: Secondary | ICD-10-CM | POA: Diagnosis not present

## 2015-08-06 DIAGNOSIS — M25551 Pain in right hip: Secondary | ICD-10-CM | POA: Diagnosis not present

## 2015-08-06 DIAGNOSIS — R2681 Unsteadiness on feet: Secondary | ICD-10-CM | POA: Diagnosis not present

## 2015-08-06 DIAGNOSIS — M25651 Stiffness of right hip, not elsewhere classified: Secondary | ICD-10-CM | POA: Diagnosis not present

## 2015-08-11 DIAGNOSIS — R2681 Unsteadiness on feet: Secondary | ICD-10-CM | POA: Diagnosis not present

## 2015-08-11 DIAGNOSIS — M25451 Effusion, right hip: Secondary | ICD-10-CM | POA: Diagnosis not present

## 2015-08-11 DIAGNOSIS — M25651 Stiffness of right hip, not elsewhere classified: Secondary | ICD-10-CM | POA: Diagnosis not present

## 2015-08-11 DIAGNOSIS — M25551 Pain in right hip: Secondary | ICD-10-CM | POA: Diagnosis not present

## 2015-08-13 DIAGNOSIS — M25551 Pain in right hip: Secondary | ICD-10-CM | POA: Diagnosis not present

## 2015-08-13 DIAGNOSIS — M25451 Effusion, right hip: Secondary | ICD-10-CM | POA: Diagnosis not present

## 2015-08-13 DIAGNOSIS — M25651 Stiffness of right hip, not elsewhere classified: Secondary | ICD-10-CM | POA: Diagnosis not present

## 2015-08-13 DIAGNOSIS — R2681 Unsteadiness on feet: Secondary | ICD-10-CM | POA: Diagnosis not present

## 2015-08-15 DIAGNOSIS — M25551 Pain in right hip: Secondary | ICD-10-CM | POA: Diagnosis not present

## 2015-08-15 DIAGNOSIS — M25451 Effusion, right hip: Secondary | ICD-10-CM | POA: Diagnosis not present

## 2015-08-15 DIAGNOSIS — M25651 Stiffness of right hip, not elsewhere classified: Secondary | ICD-10-CM | POA: Diagnosis not present

## 2015-08-15 DIAGNOSIS — R2681 Unsteadiness on feet: Secondary | ICD-10-CM | POA: Diagnosis not present

## 2015-08-20 DIAGNOSIS — M25551 Pain in right hip: Secondary | ICD-10-CM | POA: Diagnosis not present

## 2015-08-20 DIAGNOSIS — M25451 Effusion, right hip: Secondary | ICD-10-CM | POA: Diagnosis not present

## 2015-08-20 DIAGNOSIS — R2681 Unsteadiness on feet: Secondary | ICD-10-CM | POA: Diagnosis not present

## 2015-08-20 DIAGNOSIS — M25651 Stiffness of right hip, not elsewhere classified: Secondary | ICD-10-CM | POA: Diagnosis not present

## 2015-08-22 DIAGNOSIS — M25551 Pain in right hip: Secondary | ICD-10-CM | POA: Diagnosis not present

## 2015-08-22 DIAGNOSIS — M25651 Stiffness of right hip, not elsewhere classified: Secondary | ICD-10-CM | POA: Diagnosis not present

## 2015-08-22 DIAGNOSIS — M25451 Effusion, right hip: Secondary | ICD-10-CM | POA: Diagnosis not present

## 2015-08-22 DIAGNOSIS — R2681 Unsteadiness on feet: Secondary | ICD-10-CM | POA: Diagnosis not present

## 2015-08-25 DIAGNOSIS — M25451 Effusion, right hip: Secondary | ICD-10-CM | POA: Diagnosis not present

## 2015-08-25 DIAGNOSIS — M25551 Pain in right hip: Secondary | ICD-10-CM | POA: Diagnosis not present

## 2015-08-25 DIAGNOSIS — R2681 Unsteadiness on feet: Secondary | ICD-10-CM | POA: Diagnosis not present

## 2015-08-25 DIAGNOSIS — M25651 Stiffness of right hip, not elsewhere classified: Secondary | ICD-10-CM | POA: Diagnosis not present

## 2015-08-26 DIAGNOSIS — D2262 Melanocytic nevi of left upper limb, including shoulder: Secondary | ICD-10-CM | POA: Diagnosis not present

## 2015-08-26 DIAGNOSIS — L821 Other seborrheic keratosis: Secondary | ICD-10-CM | POA: Diagnosis not present

## 2015-08-26 DIAGNOSIS — D2271 Melanocytic nevi of right lower limb, including hip: Secondary | ICD-10-CM | POA: Diagnosis not present

## 2015-08-26 DIAGNOSIS — L738 Other specified follicular disorders: Secondary | ICD-10-CM | POA: Diagnosis not present

## 2015-08-26 DIAGNOSIS — D225 Melanocytic nevi of trunk: Secondary | ICD-10-CM | POA: Diagnosis not present

## 2015-08-26 DIAGNOSIS — L814 Other melanin hyperpigmentation: Secondary | ICD-10-CM | POA: Diagnosis not present

## 2015-08-27 DIAGNOSIS — M25551 Pain in right hip: Secondary | ICD-10-CM | POA: Diagnosis not present

## 2015-08-27 DIAGNOSIS — M25561 Pain in right knee: Secondary | ICD-10-CM | POA: Diagnosis not present

## 2015-09-01 DIAGNOSIS — R2681 Unsteadiness on feet: Secondary | ICD-10-CM | POA: Diagnosis not present

## 2015-09-01 DIAGNOSIS — M25451 Effusion, right hip: Secondary | ICD-10-CM | POA: Diagnosis not present

## 2015-09-01 DIAGNOSIS — M25551 Pain in right hip: Secondary | ICD-10-CM | POA: Diagnosis not present

## 2015-09-01 DIAGNOSIS — M25651 Stiffness of right hip, not elsewhere classified: Secondary | ICD-10-CM | POA: Diagnosis not present

## 2015-09-30 DIAGNOSIS — E669 Obesity, unspecified: Secondary | ICD-10-CM | POA: Diagnosis not present

## 2015-09-30 DIAGNOSIS — K760 Fatty (change of) liver, not elsewhere classified: Secondary | ICD-10-CM | POA: Diagnosis not present

## 2015-09-30 DIAGNOSIS — E1165 Type 2 diabetes mellitus with hyperglycemia: Secondary | ICD-10-CM | POA: Diagnosis not present

## 2015-09-30 DIAGNOSIS — Z6834 Body mass index (BMI) 34.0-34.9, adult: Secondary | ICD-10-CM | POA: Diagnosis not present

## 2015-09-30 DIAGNOSIS — J984 Other disorders of lung: Secondary | ICD-10-CM | POA: Diagnosis not present

## 2015-09-30 DIAGNOSIS — Z1389 Encounter for screening for other disorder: Secondary | ICD-10-CM | POA: Diagnosis not present

## 2015-09-30 DIAGNOSIS — E784 Other hyperlipidemia: Secondary | ICD-10-CM | POA: Diagnosis not present

## 2015-09-30 DIAGNOSIS — R42 Dizziness and giddiness: Secondary | ICD-10-CM | POA: Diagnosis not present

## 2015-09-30 DIAGNOSIS — I10 Essential (primary) hypertension: Secondary | ICD-10-CM | POA: Diagnosis not present

## 2015-09-30 DIAGNOSIS — F329 Major depressive disorder, single episode, unspecified: Secondary | ICD-10-CM | POA: Diagnosis not present

## 2015-09-30 DIAGNOSIS — M797 Fibromyalgia: Secondary | ICD-10-CM | POA: Diagnosis not present

## 2015-09-30 DIAGNOSIS — E119 Type 2 diabetes mellitus without complications: Secondary | ICD-10-CM | POA: Diagnosis not present

## 2015-10-31 ENCOUNTER — Ambulatory Visit (INDEPENDENT_AMBULATORY_CARE_PROVIDER_SITE_OTHER): Payer: Medicare Other | Admitting: Internal Medicine

## 2015-10-31 ENCOUNTER — Encounter: Payer: Self-pay | Admitting: Internal Medicine

## 2015-10-31 VITALS — BP 124/68 | HR 71 | Ht 64.0 in | Wt 204.4 lb

## 2015-10-31 DIAGNOSIS — J302 Other seasonal allergic rhinitis: Secondary | ICD-10-CM

## 2015-10-31 DIAGNOSIS — G4733 Obstructive sleep apnea (adult) (pediatric): Secondary | ICD-10-CM

## 2015-10-31 DIAGNOSIS — J309 Allergic rhinitis, unspecified: Secondary | ICD-10-CM | POA: Diagnosis not present

## 2015-10-31 DIAGNOSIS — J3089 Other allergic rhinitis: Secondary | ICD-10-CM

## 2015-10-31 NOTE — Patient Instructions (Signed)
Ok to continue CPAP 10/ Advanced with mask of choice, humidifier, supplies   Dx OSA           Order- please do pressure compliance download and if available, add AirView  Mucinex, Zicam, throat lozenges, antihistamines and extra fluids may help with the post-nasal drainage for now. If you need more help by the first of the week, please call.

## 2015-10-31 NOTE — Progress Notes (Signed)
Patient ID: [name], female    DOB: 11/26/1945, 71 y.o.   MRN: [id number]  HPI F followed for sleep apnea, allergic rhinitis, hx of lung nodule.  10/29/13- 41 yoF Never smoker followed for sleep apnea/ CPAP, allergic rhinitis, hx of stable lung nodule, DMII FOLLOWS FOR: Wearing cpap 10/ Advanced 6+ hours nightly.  Denies any problems with pressure, mask, or supplies.   CXR 10/30/12-  IMPRESSION:  No active cardiopulmonary abnormality. Negative for lung nodule.  Original Report Authenticated By: Carl Best, M.D.  10/31/14- 47 yoF Never smoker followed for sleep apnea/ CPAP, allergic rhinitis, hx of stable lung nodule, complicated by DMII Follows For: Uses CPAP an average of 6 hrs nightly, mask fits well. Pt states no new complaints.  Doing very well with CPAP 10/[facility]. Good download. Prefers alprazolam 0.5 mg at bedtime provided by her primary physician and used most nights.  8/42//4325-70 year old female never smoker followed for OSA/CPAP, allergic rhinitis, prior lung nodule, complicated by DM 2 FOLLOWS FOR: DME:[facility] Pt continues to wear CPAP nightly; will need to order DL as patient does not have SD card with her and not in [place]. CPAP 10/Advanced Doing very well with CPAP and reports significantly improved quality of life. We will need to order download for documentation. Reports 1 day of increasing postnasal drainage involving right ear and throat with some increased mucus in her eyes but no fever, sore throat, adenopathy or sick exposure. Not itching or sneezing.  Review of Systems-See HPI Constitutional:   No-   weight loss, , fevers, chills, fatigue, lassitude. HEENT:   Mild  headaches, No-difficulty swallowing, tooth/dental problems, sore throat,       No-  sneezing, itching, ear ache,   +nasal congestion, +post nasal drip- improved,  CV:  No-   chest pain, orthopnea, PND, swelling in lower extremities, anasarca, dizziness, palpitations Resp: No-   shortness of  breath with exertion or at rest.              No-   productive cough,  No non-productive cough,  No-  coughing up of blood.              No-   change in color of mucus.  No- wheezing.   Skin: No-   rash or lesions. GI:  No-   heartburn, indigestion, abdominal pain, nausea, vomiting,  GU: . MS:  No-   joint pain or swelling.  Neuro- nothing unusual:  Psych:  No- change in mood or affect. No depression or anxiety.  No memory loss.  Objective:   Physical Exam General- Alert, Oriented, Affect-appropriate, Distress- none acute, +overweight Skin- rash-none, lesions- none, excoriation- none Lymphadenopathy- none Head- atraumatic            Eyes- Gross vision intact, PERRLA, conjunctivae clear secretions            Ears- Hearing, canals-normal            Nose- turbinate edema, no-Septal dev, mucus, polyps, erosion, perforation             Throat- Mallampati III , mucosa Dry, drainage- none, tonsils- atrophic Neck- flexible , trachea midline, no stridor , thyroid nl, carotid no bruit Chest - symmetrical excursion , unlabored           Heart/CV- RRR ,  +Murmur 1/6 systolic AS , no gallop  , no rub, nl s1 s2                           -  JVD- none , edema- none, stasis changes- none, varices- none           Lung- clear to P&A, wheeze- none, cough- none , dullness-none, rub- none           Chest wall-  Abd-  Br/ Gen/ Rectal- Not done, not indicated Extrem- cyanosis- none, clubbing, none, atrophy- none, strength- nl Neuro- grossly intact to observation     

## 2015-10-31 NOTE — Assessment & Plan Note (Signed)
Not a bacterial infection and I doubt this is a viral upper respiratory infection although very early still. Plan-discussed symptomatic OTC therapies available.

## 2015-10-31 NOTE — Assessment & Plan Note (Signed)
She describes significantly improved quality of life and reports using CPAP all night every night. Comfortable with pressure at 10. Plan-order CPAP download for documentation

## 2015-11-07 ENCOUNTER — Encounter: Payer: Self-pay | Admitting: Internal Medicine

## 2015-11-09 DIAGNOSIS — J019 Acute sinusitis, unspecified: Secondary | ICD-10-CM | POA: Diagnosis not present

## 2015-11-26 DIAGNOSIS — H1013 Acute atopic conjunctivitis, bilateral: Secondary | ICD-10-CM | POA: Diagnosis not present

## 2015-12-03 DIAGNOSIS — E119 Type 2 diabetes mellitus without complications: Secondary | ICD-10-CM | POA: Diagnosis not present

## 2015-12-03 DIAGNOSIS — I10 Essential (primary) hypertension: Secondary | ICD-10-CM | POA: Diagnosis not present

## 2015-12-03 DIAGNOSIS — Z6835 Body mass index (BMI) 35.0-35.9, adult: Secondary | ICD-10-CM | POA: Diagnosis not present

## 2015-12-10 DIAGNOSIS — Z23 Encounter for immunization: Secondary | ICD-10-CM | POA: Diagnosis not present

## 2015-12-23 ENCOUNTER — Other Ambulatory Visit: Payer: Self-pay | Admitting: Internal Medicine

## 2015-12-23 DIAGNOSIS — Z1231 Encounter for screening mammogram for malignant neoplasm of breast: Secondary | ICD-10-CM

## 2015-12-29 ENCOUNTER — Ambulatory Visit (INDEPENDENT_AMBULATORY_CARE_PROVIDER_SITE_OTHER): Payer: Medicare Other

## 2015-12-29 ENCOUNTER — Encounter: Payer: Self-pay | Admitting: Podiatry

## 2015-12-29 ENCOUNTER — Ambulatory Visit (INDEPENDENT_AMBULATORY_CARE_PROVIDER_SITE_OTHER): Payer: Medicare Other | Admitting: Podiatry

## 2015-12-29 VITALS — BP 153/77 | HR 77 | Resp 16 | Ht 64.0 in | Wt 200.0 lb

## 2015-12-29 DIAGNOSIS — M79672 Pain in left foot: Secondary | ICD-10-CM

## 2015-12-29 DIAGNOSIS — M779 Enthesopathy, unspecified: Secondary | ICD-10-CM

## 2015-12-29 MED ORDER — TRIAMCINOLONE ACETONIDE 10 MG/ML IJ SUSP
10.0000 mg | Freq: Once | INTRAMUSCULAR | Status: AC
  Administered 2015-12-29: 10 mg

## 2015-12-29 NOTE — Progress Notes (Signed)
   Subjective:    Patient ID: [name], female    DOB: November 13, 1945, 70 y.o.   MRN: [id number]  HPI Chief Complaint  Patient presents with  . Foot Pain    Left foot; dorsal & lateral side; pt stated, "Has had pain for the past 3-4 weeks"    Pt Diabetic Type 2; Sugar=113 this am; A1C=6.7  Review of Systems  HENT: Positive for hearing loss.   All other systems reviewed and are negative.      Objective:   Physical Exam        Assessment & Plan:

## 2015-12-30 NOTE — Progress Notes (Signed)
Subjective:     Patient ID: [name], female   DOB: [birth year], 70 y.o.   MRN: [id number]  HPI patient presents with a lot of pain in the left foot and states it's been hurting on the outside for several months and she does not remember specific injury   Review of Systems  All other systems reviewed and are negative.      Objective:   Physical Exam  Constitutional: She is oriented to person, place, and time.  Cardiovascular: Intact distal pulses.   Musculoskeletal: Normal range of motion.  Neurological: She is oriented to person, place, and time.  Skin: Skin is warm.  Nursing note and vitals reviewed.  neurovascular status intact muscle strength adequate range of motion within normal limits with patient found to have discomfort and tendon inflammation around the lateral side of the left foot peroneal complex with no indication of tendon dysfunction. Patient's found to have good digital perfusion and is well oriented 3     Assessment:     Lateral tendinitis of the peroneal group    Plan:     H&P conditions reviewed and did careful sheath injection left 3 mg Kenalog 5 mg Xylocaine and advised on physical therapy supportive shoes and dispensed fascial brace to lift the lateral side of the foot. Reappoint to recheck

## 2016-01-07 DIAGNOSIS — R1032 Left lower quadrant pain: Secondary | ICD-10-CM | POA: Diagnosis not present

## 2016-01-07 DIAGNOSIS — K5900 Constipation, unspecified: Secondary | ICD-10-CM | POA: Diagnosis not present

## 2016-01-08 DIAGNOSIS — M25551 Pain in right hip: Secondary | ICD-10-CM | POA: Diagnosis not present

## 2016-01-08 DIAGNOSIS — M545 Low back pain: Secondary | ICD-10-CM | POA: Diagnosis not present

## 2016-01-08 DIAGNOSIS — M25552 Pain in left hip: Secondary | ICD-10-CM | POA: Diagnosis not present

## 2016-01-08 DIAGNOSIS — M5136 Other intervertebral disc degeneration, lumbar region: Secondary | ICD-10-CM | POA: Diagnosis not present

## 2016-01-15 ENCOUNTER — Ambulatory Visit (INDEPENDENT_AMBULATORY_CARE_PROVIDER_SITE_OTHER): Payer: Medicare Other | Admitting: Podiatry

## 2016-01-15 DIAGNOSIS — I10 Essential (primary) hypertension: Secondary | ICD-10-CM | POA: Diagnosis not present

## 2016-01-15 DIAGNOSIS — M779 Enthesopathy, unspecified: Secondary | ICD-10-CM

## 2016-01-15 DIAGNOSIS — E784 Other hyperlipidemia: Secondary | ICD-10-CM | POA: Diagnosis not present

## 2016-01-15 DIAGNOSIS — E1165 Type 2 diabetes mellitus with hyperglycemia: Secondary | ICD-10-CM | POA: Diagnosis not present

## 2016-01-15 DIAGNOSIS — E038 Other specified hypothyroidism: Secondary | ICD-10-CM | POA: Diagnosis not present

## 2016-01-19 ENCOUNTER — Ambulatory Visit (HOSPITAL_COMMUNITY)
Admission: EM | Admit: 2016-01-19 | Discharge: 2016-01-19 | Disposition: A | Discharge status: Home / Self Care | Payer: Medicare Other | Attending: Family Medicine | Admitting: Family Medicine

## 2016-01-19 ENCOUNTER — Encounter (HOSPITAL_COMMUNITY): Payer: Self-pay | Admitting: Emergency Medicine

## 2016-01-19 DIAGNOSIS — E038 Other specified hypothyroidism: Secondary | ICD-10-CM | POA: Diagnosis not present

## 2016-01-19 DIAGNOSIS — E119 Type 2 diabetes mellitus without complications: Secondary | ICD-10-CM | POA: Diagnosis not present

## 2016-01-19 DIAGNOSIS — K760 Fatty (change of) liver, not elsewhere classified: Secondary | ICD-10-CM | POA: Diagnosis not present

## 2016-01-19 DIAGNOSIS — M797 Fibromyalgia: Secondary | ICD-10-CM | POA: Diagnosis not present

## 2016-01-19 DIAGNOSIS — M5432 Sciatica, left side: Secondary | ICD-10-CM | POA: Diagnosis not present

## 2016-01-19 DIAGNOSIS — G43909 Migraine, unspecified, not intractable, without status migrainosus: Secondary | ICD-10-CM | POA: Diagnosis not present

## 2016-01-19 DIAGNOSIS — I10 Essential (primary) hypertension: Secondary | ICD-10-CM | POA: Diagnosis not present

## 2016-01-19 DIAGNOSIS — Z1389 Encounter for screening for other disorder: Secondary | ICD-10-CM | POA: Diagnosis not present

## 2016-01-19 DIAGNOSIS — F329 Major depressive disorder, single episode, unspecified: Secondary | ICD-10-CM | POA: Diagnosis not present

## 2016-01-19 DIAGNOSIS — E784 Other hyperlipidemia: Secondary | ICD-10-CM | POA: Diagnosis not present

## 2016-01-19 DIAGNOSIS — Z6834 Body mass index (BMI) 34.0-34.9, adult: Secondary | ICD-10-CM | POA: Diagnosis not present

## 2016-01-19 DIAGNOSIS — J984 Other disorders of lung: Secondary | ICD-10-CM | POA: Diagnosis not present

## 2016-01-19 DIAGNOSIS — Z0000 Encounter for general adult medical examination without abnormal findings: Secondary | ICD-10-CM | POA: Diagnosis not present

## 2016-01-19 MED ORDER — KETOROLAC TROMETHAMINE 60 MG/2ML IM SOLN
60.0000 mg | Freq: Once | INTRAMUSCULAR | Status: AC
  Administered 2016-01-19: 60 mg via INTRAMUSCULAR

## 2016-01-19 MED ORDER — KETOROLAC TROMETHAMINE 60 MG/2ML IM SOLN
INTRAMUSCULAR | Status: AC
  Filled 2016-01-19: qty 2

## 2016-01-19 MED ORDER — PREDNISONE 20 MG PO TABS: ORAL_TABLET | ORAL | 0 refills | Status: DC

## 2016-01-19 MED ORDER — HYDROCODONE-ACETAMINOPHEN 7.5-325 MG PO TABS: 1.0000 | ORAL_TABLET | Freq: Four times a day (QID) | ORAL | 0 refills | Status: DC | PRN

## 2016-01-19 NOTE — ED Provider Notes (Signed)
[facility]    CSN: [id number] Arrival date & time: 01/19/16  1327     History   Chief Complaint Chief Complaint  Patient presents with  . Leg Pain    HPI [place] is a 70 y.o. female.   This is 20 year old woman who presents with left leg pain and numbness in her foot. Symptoms began when she was getting dressed to go to the doctor's office this morning. She had a complete physical with her doctor while the pain was increasing. She said she had a good physical exam but nothing was done about this hip..  Patient is a known hypertensive patient who also has diabetes, fibromyalgia and GERD.  Patient says the pain is severe and constant but worse with weightbearing. Pain starts about the posterior iliac spine and radiates down the leg all the way to the foot giving numbness to the whole foot with paresthesias there as well.        Past Medical History:  Diagnosis Date  . DM (diabetes mellitus) ([place]) 2013   insulin  . Eustachian tube dysfunction   . GERD (gastroesophageal reflux disease)   . Hyperlipidemia   . Hypertension   . Hypothyroid   . Lung nodule    CT abd by [facility] 09/29/2009  . OSA (obstructive sleep apnea)   . Osteopenia     Patient Active Problem List   Diagnosis Date Noted  . Chronic insomnia 01/28/2011  . Seasonal and perennial allergic rhinitis 05/28/2010  . Lung nodule 05/28/2010  . HYPERLIPIDEMIA 11/23/2009  . G E R D 11/23/2009  . EUSTACHIAN TUBE DYSFUNCTION 11/18/2009  . HYPERTENSION 11/18/2009  . Unspecified hypothyroidism 03/14/2007  . Obstructive sleep apnea 03/14/2007  . FIBROMYALGIA 03/14/2007    Past Surgical History:  Procedure Laterality Date  . ANKLE SURGERY    . Anteriorvesicourethropexy    . BUNIONECTOMY    . COLONOSCOPY    . NASAL SINUS SURGERY    . PATELLA FRACTURE SURGERY  2009   Right  . TONSILLECTOMY    . TOTAL ABDOMINAL HYSTERECTOMY    . WRIST FRACTURE SURGERY  2009    OB History     No data available       Home Medications    Prior to Admission medications   Medication Sig Start Date End Date Taking? Authorizing Provider  ACCU-CHEK SMARTVIEW test strip USE TO CHECK BLOOD SUGAR 3 TO 4 TIMES DAILY 12/17/15  Yes Historical Provider, MD  ALPRAZolam Duanne Moron) 0.5 MG tablet  11/21/15  Yes Historical Provider, MD  aspirin 81 MG chewable tablet Chew 81 mg by mouth daily.   Yes Historical Provider, MD  DULoxetine (CYMBALTA) 30 MG capsule Take 1 capsule by mouth daily. 10/29/15  Yes Historical Provider, MD  Insulin Glargine (TOUJEO SOLOSTAR) 300 UNIT/ML SOPN Inject 30 Units into the skin daily.   Yes Historical Provider, MD  losartan (COZAAR) 100 MG tablet Take 100 mg by mouth daily. Pt takes 1/2 tablet in the morning and 1/2 tablet in the evening   Yes Historical Provider, MD  Multiple Vitamins-Minerals (MULTIVITAMIN WOMEN PO) Take 1 tablet by mouth daily. Pt takes Alive   Yes Historical Provider, MD  Omega-3 Fatty Acids (FISH OIL PO) Take 1 tablet by mouth 2 (two) times daily.   Yes Historical Provider, MD  simvastatin (ZOCOR) 20 MG tablet Take 1 tablet by mouth daily. 07/19/11  Yes Historical Provider, MD  Exenatide ER (BYDUREON) 2 MG PEN Inject 2 mg into the  skin once a week.    Historical Provider, MD  HYDROcodone-acetaminophen (NORCO) 7.5-325 MG tablet Take 1 tablet by mouth every 6 (six) hours as needed. 01/19/16   Robyn Haber, MD  losartan-hydrochlorothiazide (HYZAAR) 100-25 MG tablet Take 1 tablet by mouth 2 (two) times daily.    Historical Provider, MD  predniSONE (DELTASONE) 20 MG tablet 3 today, 2 tomorrow, and one on Wednesday with food 01/19/16   Robyn Haber, MD    Family History Family History  Problem Relation Age of Onset  . Heart attack Mother   . Colon cancer Father     Social History Social History  Substance Use Topics  . Smoking status: Never Smoker  . Smokeless tobacco: Never Used  . Alcohol use Not on file     Allergies   Review of  patient's allergies indicates no known allergies.   Review of Systems Review of Systems  Constitutional: Negative.   HENT: Negative.   Eyes: Negative.   Respiratory: Negative.   Cardiovascular: Negative.   Gastrointestinal: Negative.      Physical Exam Triage Vital Signs ED Triage Vitals  Enc Vitals Group     BP      Pulse      Resp      Temp      Temp src      SpO2      Weight      Height      Head Circumference      Peak Flow      Pain Score      Pain Loc      Pain Edu?      Excl. in [place]?    No data found.   Updated Vital Signs BP 173/77   Pulse 107   Temp 98 F (36.7 C) (Oral)   Resp 16   SpO2 98%  Physical Exam  Constitutional: She is oriented to person, place, and time. She appears well-developed and well-nourished. She appears distressed.  HENT:  Head: Normocephalic.  Right Ear: External ear normal.  Left Ear: External ear normal.  Mouth/Throat: Oropharynx is clear and moist.  Eyes: Conjunctivae and EOM are normal. Pupils are equal, round, and reactive to light.  Neck: Normal range of motion. Neck supple.  Cardiovascular: Normal rate.   Pulmonary/Chest: Effort normal.  Abdominal: Soft.  Musculoskeletal: Normal range of motion.  Neurological: She is alert and oriented to person, place, and time. She has normal reflexes. No cranial nerve deficit. She exhibits normal muscle tone.  Skin: Skin is warm and dry.  Psychiatric: She has a normal mood and affect.  Nursing note and vitals reviewed.    UC Treatments / Results  Labs (all labs ordered are listed, but only abnormal results are displayed) Labs Reviewed - No data to display  EKG  EKG Interpretation None       Radiology No results found.  Procedures Procedures (including critical care time)  Medications Ordered in UC Medications  ketorolac (TORADOL) injection 60 mg (not administered)     Initial Impression / Assessment and Plan / UC Course  I have reviewed the triage vital  signs and the nursing notes.  Pertinent labs & imaging results that were available during my care of the patient were reviewed by me and considered in my medical decision making (see chart for details).  Clinical Course    Final Clinical Impressions(s) / UC Diagnoses   Final diagnoses:  Sciatica of left side    New Prescriptions New Prescriptions  HYDROCODONE-ACETAMINOPHEN (NORCO) 7.5-325 MG TABLET    Take 1 tablet by mouth every 6 (six) hours as needed.   PREDNISONE (DELTASONE) 20 MG TABLET    3 today, 2 tomorrow, and one on Wednesday with food     Robyn Haber, MD 01/19/16 1425

## 2016-01-19 NOTE — ED Triage Notes (Signed)
Patient presents to [facility], she states that she woke up this morning and she noticed the pain in her back and the pain is going her left leg, she states that her left foot is numb as well.

## 2016-01-21 ENCOUNTER — Encounter (HOSPITAL_COMMUNITY): Payer: Self-pay | Admitting: Emergency Medicine

## 2016-01-21 ENCOUNTER — Emergency Department (HOSPITAL_COMMUNITY)
Admission: EM | Admit: 2016-01-21 | Discharge: 2016-01-21 | Disposition: A | Discharge status: Home / Self Care | Payer: Medicare Other | Attending: Emergency Medicine | Admitting: Emergency Medicine

## 2016-01-21 DIAGNOSIS — E039 Hypothyroidism, unspecified: Secondary | ICD-10-CM | POA: Diagnosis not present

## 2016-01-21 DIAGNOSIS — Z79899 Other long term (current) drug therapy: Secondary | ICD-10-CM | POA: Diagnosis not present

## 2016-01-21 DIAGNOSIS — Z7982 Long term (current) use of aspirin: Secondary | ICD-10-CM | POA: Insufficient documentation

## 2016-01-21 DIAGNOSIS — M5442 Lumbago with sciatica, left side: Secondary | ICD-10-CM | POA: Insufficient documentation

## 2016-01-21 DIAGNOSIS — M5432 Sciatica, left side: Secondary | ICD-10-CM

## 2016-01-21 DIAGNOSIS — I10 Essential (primary) hypertension: Secondary | ICD-10-CM | POA: Diagnosis not present

## 2016-01-21 DIAGNOSIS — M545 Low back pain: Secondary | ICD-10-CM | POA: Diagnosis present

## 2016-01-21 DIAGNOSIS — E119 Type 2 diabetes mellitus without complications: Secondary | ICD-10-CM | POA: Insufficient documentation

## 2016-01-21 DIAGNOSIS — Z794 Long term (current) use of insulin: Secondary | ICD-10-CM | POA: Diagnosis not present

## 2016-01-21 LAB — CBG MONITORING, ED: Glucose-Capillary: 169 mg/dL — ABNORMAL HIGH (ref 65–99)

## 2016-01-21 MED ORDER — CYCLOBENZAPRINE HCL 10 MG PO TABS: 10.0000 mg | ORAL_TABLET | Freq: Three times a day (TID) | ORAL | 0 refills | Status: DC | PRN

## 2016-01-21 MED ORDER — DEXAMETHASONE SODIUM PHOSPHATE 10 MG/ML IJ SOLN
10.0000 mg | Freq: Once | INTRAMUSCULAR | Status: AC
  Administered 2016-01-21: 10 mg via INTRAMUSCULAR
  Filled 2016-01-21: qty 1

## 2016-01-21 MED ORDER — DIAZEPAM 5 MG/ML IJ SOLN
10.0000 mg | Freq: Once | INTRAMUSCULAR | Status: AC
  Administered 2016-01-21: 10 mg via INTRAMUSCULAR
  Filled 2016-01-21: qty 2

## 2016-01-21 MED ORDER — PREDNISONE 20 MG PO TABS: ORAL_TABLET | ORAL | 0 refills | Status: DC

## 2016-01-21 MED ORDER — KETOROLAC TROMETHAMINE 60 MG/2ML IM SOLN
60.0000 mg | Freq: Once | INTRAMUSCULAR | Status: AC
  Administered 2016-01-21: 60 mg via INTRAMUSCULAR
  Filled 2016-01-21: qty 2

## 2016-01-21 NOTE — Discharge Instructions (Signed)
Use ice on the area in your left buttock that is painful. Take the new prescription for prednisone which is longer and will give you more anti-inflammatory relief for the inflamed sciatic nerve. Keep your appointment with the orthopedist. You have pain pills you were prescribe at the urgent care.

## 2016-01-21 NOTE — ED Provider Notes (Signed)
[facility] DEPT Provider Note   CSN: [id number] Arrival date & time: 01/21/16  0349  Time seen 03:47 AM   History   Chief Complaint Chief Complaint  Patient presents with  . Back Pain    HPI [place] is a 70 y.o. female.  HPI  patient states October 30 she was getting dressed and she turned and had acute onset of pain in her left back that radiates down her left buttock down the back part of her leg through her calf and into her left foot. She states her left foot is been getting numb. She was seen later that same day by her PCP for preplanned physical and states the pain was mild at that time. She has had bursitis in her hips before and her doctor thought maybe that's what was going on. However the pain started getting worse. She was seen later the same day at urgent care and got a "steroid" injection which was actually Toradol and given a 3 day prednisone course. She states her pain is not improving. She states the pain is there constantly. Walking and laying flat makes the pain worse. Nothing makes it feel better. She's has tried ice and heat. She states the pain was so bad she had to sleep in a chair. She is also given a prescription for hydrocodone. Patient states her CBG at 2 AM was 174 although it was in the 370s earlier yesterday. Patient has an appointment on November 22 with Dr. Ninfa Linden for some right hip problems she has been having. She states due to the steroids she talked her endocrinologist and they did increase her insulin while she is on the prednisone. She denies any urinary or rectal incontinence.  PCP Dr Dionne Ano Dr Ninfa Linden Endo Dr Haskell Flirt  Past Medical History:  Diagnosis Date  . DM (diabetes mellitus) ([place]) 2013   insulin  . Eustachian tube dysfunction   . GERD (gastroesophageal reflux disease)   . Hyperlipidemia   . Hypertension   . Hypothyroid   . Lung nodule    CT abd by [facility] 09/29/2009  . OSA (obstructive sleep apnea)   .  Osteopenia     Patient Active Problem List   Diagnosis Date Noted  . Chronic insomnia 01/28/2011  . Seasonal and perennial allergic rhinitis 05/28/2010  . Lung nodule 05/28/2010  . HYPERLIPIDEMIA 11/23/2009  . G E R D 11/23/2009  . EUSTACHIAN TUBE DYSFUNCTION 11/18/2009  . HYPERTENSION 11/18/2009  . Unspecified hypothyroidism 03/14/2007  . Obstructive sleep apnea 03/14/2007  . FIBROMYALGIA 03/14/2007    Past Surgical History:  Procedure Laterality Date  . ANKLE SURGERY    . Anteriorvesicourethropexy    . BUNIONECTOMY    . COLONOSCOPY    . NASAL SINUS SURGERY    . PATELLA FRACTURE SURGERY  2009   Right  . TONSILLECTOMY    . TOTAL ABDOMINAL HYSTERECTOMY    . WRIST FRACTURE SURGERY  2009    OB History    No data available       Home Medications    Prior to Admission medications   Medication Sig Start Date End Date Taking? Authorizing Provider  ACCU-CHEK SMARTVIEW test strip USE TO CHECK BLOOD SUGAR 3 TO 4 TIMES DAILY 12/17/15   Historical Provider, MD  ALPRAZolam Duanne Moron) 0.5 MG tablet  11/21/15   Historical Provider, MD  aspirin 81 MG chewable tablet Chew 81 mg by mouth daily.    Historical Provider, MD  cyclobenzaprine (FLEXERIL) 10 MG  tablet Take 1 tablet (10 mg total) by mouth 3 (three) times daily as needed for muscle spasms. 01/21/16   Rolland Porter, MD  DULoxetine (CYMBALTA) 30 MG capsule Take 1 capsule by mouth daily. 10/29/15   Historical Provider, MD  Exenatide ER (BYDUREON) 2 MG PEN Inject 2 mg into the skin once a week.    Historical Provider, MD  HYDROcodone-acetaminophen (NORCO) 7.5-325 MG tablet Take 1 tablet by mouth every 6 (six) hours as needed. 01/19/16   Robyn Haber, MD  Insulin Glargine (TOUJEO SOLOSTAR) 300 UNIT/ML SOPN Inject 30 Units into the skin daily.    Historical Provider, MD  losartan (COZAAR) 100 MG tablet Take 100 mg by mouth daily. Pt takes 1/2 tablet in the morning and 1/2 tablet in the evening    Historical Provider, MD    losartan-hydrochlorothiazide (HYZAAR) 100-25 MG tablet Take 1 tablet by mouth 2 (two) times daily.    Historical Provider, MD  Multiple Vitamins-Minerals (MULTIVITAMIN WOMEN PO) Take 1 tablet by mouth daily. Pt takes Metallurgist, MD  Omega-3 Fatty Acids (FISH OIL PO) Take 1 tablet by mouth 2 (two) times daily.    Historical Provider, MD  predniSONE (DELTASONE) 20 MG tablet Take 3 po QD x 3d , then 2 po QD x 3d then 1 po QD x 3d 01/21/16   Rolland Porter, MD  simvastatin (ZOCOR) 20 MG tablet Take 1 tablet by mouth daily. 07/19/11   Historical Provider, MD    Family History Family History  Problem Relation Age of Onset  . Heart attack Mother   . Colon cancer Father     Social History Social History  Substance Use Topics  . Smoking status: Never Smoker  . Smokeless tobacco: Never Used  . Alcohol use Not on file  lives with family   Allergies   Review of patient's allergies indicates no known allergies.   Review of Systems Review of Systems  All other systems reviewed and are negative.    Physical Exam Updated Vital Signs BP 169/91   Pulse 80   Temp 98.2 F (36.8 C)   Resp 20   Ht 5' (1.524 m)   Wt 201 lb (91.2 kg)   SpO2 97%   BMI 39.26 kg/m   Physical Exam  Constitutional: She is oriented to person, place, and time. She appears well-developed and well-nourished.  Non-toxic appearance. She does not appear ill. She appears distressed.  HENT:  Head: Normocephalic and atraumatic.  Right Ear: External ear normal.  Left Ear: External ear normal.  Nose: Nose normal. No mucosal edema or rhinorrhea.  Mouth/Throat: Oropharynx is clear and moist and mucous membranes are normal. No dental abscesses or uvula swelling.  Eyes: Conjunctivae and EOM are normal. Pupils are equal, round, and reactive to light.  Neck: Normal range of motion and full passive range of motion without pain. Neck supple.  Cardiovascular: Normal rate, regular rhythm and normal heart sounds.   Exam reveals no gallop and no friction rub.   No murmur heard. Pulmonary/Chest: Effort normal and breath sounds normal. No respiratory distress. She has no wheezes. She has no rhonchi. She has no rales. She exhibits no tenderness and no crepitus.  Abdominal: Soft. Normal appearance and bowel sounds are normal. She exhibits no distension. There is no tenderness. There is no rebound and no guarding.  Musculoskeletal: Normal range of motion. She exhibits no edema or tenderness.       Back:  Moves all extremities well.  Tender over the left SI joint and over the left sciatic notch. She is nontender to palpation in the midline lumbar or thoracic spine.  Neurological: She is alert and oriented to person, place, and time. She has normal strength. No cranial nerve deficit.  Skin: Skin is warm, dry and intact. No rash noted. No erythema. No pallor.  Psychiatric: She has a normal mood and affect. Her speech is normal and behavior is normal. Her mood appears not anxious.  Nursing note and vitals reviewed.    ED Treatments / Results  Labs (all labs ordered are listed, but only abnormal results are displayed) Labs Reviewed  CBG MONITORING, ED - Abnormal; Notable for the following:       Result Value   Glucose-Capillary 169 (*)    All other components within normal limits   Laboratory interpretation all normal except mild hyperglycemia     Procedures Procedures (including critical care time)  Medications Ordered in ED Medications  dexamethasone (DECADRON) injection 10 mg (10 mg Intramuscular Given 01/21/16 0419)  ketorolac (TORADOL) injection 60 mg (60 mg Intramuscular Given 01/21/16 0420)  diazepam (VALIUM) injection 10 mg (10 mg Intramuscular Given 01/21/16 0420)     Initial Impression / Assessment and Plan / ED Course  I have reviewed the triage vital signs and the nursing notes.  Pertinent labs & imaging results that were available during my care of the patient were reviewed by me and  considered in my medical decision making (see chart for details).  Clinical Course   Patient's exam is consistent with acute sciatica. She was given IM Toradol, IM Decadron, and Valium IM.  At time of discharge patient was sitting in the bedside chair. She states her pain was improving but not gone. I discussed getting an outpatient MRI which is not available here at night however she prefers to wait and see her orthopedist during her appointment on November 22. She was sent home with a more prolonged dose of steroids, her prescription given on Monday was only for a three-day course. She has hydrocodone to take for pain. She had a muscle relaxer added.  Final Clinical Impressions(s) / ED Diagnoses   Final diagnoses:  Sciatica of left side    New Prescriptions New Prescriptions   CYCLOBENZAPRINE (FLEXERIL) 10 MG TABLET    Take 1 tablet (10 mg total) by mouth 3 (three) times daily as needed for muscle spasms.   PREDNISONE (DELTASONE) 20 MG TABLET    Take 3 po QD x 3d , then 2 po QD x 3d then 1 po QD x 3d    Plan discharge  Rolland Porter, MD, Barbette Or, MD 01/21/16 [phone]

## 2016-01-21 NOTE — ED Triage Notes (Signed)
Pt c/o lower back pain that radiates down left leg since Monday. Pt was seen at urgent care for the same. Pt states she cannot get any relief from the pain.

## 2016-01-22 ENCOUNTER — Emergency Department (HOSPITAL_COMMUNITY)
Admission: EM | Admit: 2016-01-22 | Discharge: 2016-01-22 | Disposition: A | Discharge status: Home / Self Care | Payer: Medicare Other | Attending: Emergency Medicine | Admitting: Emergency Medicine

## 2016-01-22 ENCOUNTER — Encounter (HOSPITAL_COMMUNITY): Payer: Self-pay | Admitting: Emergency Medicine

## 2016-01-22 DIAGNOSIS — I10 Essential (primary) hypertension: Secondary | ICD-10-CM | POA: Diagnosis not present

## 2016-01-22 DIAGNOSIS — M5432 Sciatica, left side: Secondary | ICD-10-CM | POA: Diagnosis not present

## 2016-01-22 DIAGNOSIS — Z1211 Encounter for screening for malignant neoplasm of colon: Secondary | ICD-10-CM | POA: Diagnosis not present

## 2016-01-22 DIAGNOSIS — E119 Type 2 diabetes mellitus without complications: Secondary | ICD-10-CM | POA: Insufficient documentation

## 2016-01-22 DIAGNOSIS — K641 Second degree hemorrhoids: Secondary | ICD-10-CM | POA: Diagnosis not present

## 2016-01-22 DIAGNOSIS — Z794 Long term (current) use of insulin: Secondary | ICD-10-CM | POA: Insufficient documentation

## 2016-01-22 DIAGNOSIS — E039 Hypothyroidism, unspecified: Secondary | ICD-10-CM | POA: Insufficient documentation

## 2016-01-22 DIAGNOSIS — Z800 Family history of malignant neoplasm of digestive organs: Secondary | ICD-10-CM | POA: Diagnosis not present

## 2016-01-22 DIAGNOSIS — Z86010 Personal history of colonic polyps: Secondary | ICD-10-CM | POA: Diagnosis not present

## 2016-01-22 DIAGNOSIS — Z7982 Long term (current) use of aspirin: Secondary | ICD-10-CM | POA: Insufficient documentation

## 2016-01-22 DIAGNOSIS — M25552 Pain in left hip: Secondary | ICD-10-CM | POA: Diagnosis present

## 2016-01-22 MED ORDER — MORPHINE SULFATE (PF) 4 MG/ML IV SOLN
4.0000 mg | Freq: Once | INTRAVENOUS | Status: AC
  Administered 2016-01-22: 4 mg via INTRAMUSCULAR
  Filled 2016-01-22: qty 1

## 2016-01-22 MED ORDER — GABAPENTIN 300 MG PO CAPS: 300.0000 mg | ORAL_CAPSULE | Freq: Two times a day (BID) | ORAL | 0 refills | Status: DC

## 2016-01-22 MED ORDER — GABAPENTIN 300 MG PO CAPS
300.0000 mg | ORAL_CAPSULE | Freq: Once | ORAL | Status: AC
  Administered 2016-01-22: 300 mg via ORAL
  Filled 2016-01-22: qty 1

## 2016-01-22 NOTE — ED Provider Notes (Signed)
[facility] DEPT Provider Note   CSN: [id number] Arrival date & time: 01/22/16  1828   By signing my name below, I, Delton Prairie, attest that this documentation has been prepared under the direction and in the presence of  Altie Savard, PA-C. Electronically Signed: Delton Prairie, ED Scribe. 01/22/16. 7:34 PM.   History   Chief Complaint Chief Complaint  Patient presents with  . Hip Pain   The history is provided by the patient. No language interpreter was used.   HPI Comments:  [name] is a 70 y.o. female, with a hx of recently diagnosed sciatica, who presents to the Emergency Department complaining of constant left hip pain x 4 days. Pt states her pain radiates down the back of her left leg and notes tingling to the toes of her left foot "like they're asleep." She states that the pain started on the morning of October 30 oh she was dressing. Patient turned and felt a sudden pain in her left hip. She notes her pain is exacerbated with movement and to the touch. Pt states she visited UC 3 days ago and was given a "shot of cortisone and pain medication." She also notes she visited [facility] yesterday and was given 3 shots and a muscle relaxer. Pt notes the pain medications provided relief for about 1 hour. She denies falls/trauma, weakness, changes in bowel or bladder function, urinary complaints, any other symptoms at this time.    Pt has an appointment scheduled with an orthopedist, Dr. Erlinda Hong, on 01/27/16. Patient states that she also previously scheduled an appointment with Dr. Ninfa Linden on November 22, however, but called back and was given the appointment on November 7.   Past Medical History:  Diagnosis Date  . DM (diabetes mellitus) ([place]) 2013   insulin  . Eustachian tube dysfunction   . GERD (gastroesophageal reflux disease)   . Hyperlipidemia   . Hypertension   . Hypothyroid   . Lung nodule    CT abd by [facility] 09/29/2009  . OSA (obstructive sleep apnea)   .  Osteopenia     Patient Active Problem List   Diagnosis Date Noted  . Chronic insomnia 01/28/2011  . Seasonal and perennial allergic rhinitis 05/28/2010  . Lung nodule 05/28/2010  . HYPERLIPIDEMIA 11/23/2009  . G E R D 11/23/2009  . EUSTACHIAN TUBE DYSFUNCTION 11/18/2009  . HYPERTENSION 11/18/2009  . Unspecified hypothyroidism 03/14/2007  . Obstructive sleep apnea 03/14/2007  . FIBROMYALGIA 03/14/2007    Past Surgical History:  Procedure Laterality Date  . ANKLE SURGERY    . Anteriorvesicourethropexy    . BUNIONECTOMY    . COLONOSCOPY    . NASAL SINUS SURGERY    . PATELLA FRACTURE SURGERY  2009   Right  . TONSILLECTOMY    . TOTAL ABDOMINAL HYSTERECTOMY    . WRIST FRACTURE SURGERY  2009    OB History    No data available       Home Medications    Prior to Admission medications   Medication Sig Start Date End Date Taking? Authorizing Provider  ACCU-CHEK SMARTVIEW test strip USE TO CHECK BLOOD SUGAR 3 TO 4 TIMES DAILY 12/17/15   Historical Provider, MD  ALPRAZolam Duanne Moron) 0.5 MG tablet  11/21/15   Historical Provider, MD  aspirin 81 MG chewable tablet Chew 81 mg by mouth daily.    Historical Provider, MD  cyclobenzaprine (FLEXERIL) 10 MG tablet Take 1 tablet (10 mg total) by mouth 3 (three) times daily as needed for muscle  spasms. 01/21/16   Rolland Porter, MD  DULoxetine (CYMBALTA) 30 MG capsule Take 1 capsule by mouth daily. 10/29/15   Historical Provider, MD  Exenatide ER (BYDUREON) 2 MG PEN Inject 2 mg into the skin once a week.    Historical Provider, MD  gabapentin (NEURONTIN) 300 MG capsule Take 1 capsule (300 mg total) by mouth 2 (two) times daily. 01/22/16   Georgean Spainhower C Ausar Georgiou, PA-C  HYDROcodone-acetaminophen (NORCO) 7.5-325 MG tablet Take 1 tablet by mouth every 6 (six) hours as needed. 01/19/16   Robyn Haber, MD  Insulin Glargine (TOUJEO SOLOSTAR) 300 UNIT/ML SOPN Inject 30 Units into the skin daily.    Historical Provider, MD  losartan (COZAAR) 100 MG tablet Take 100 mg by  mouth daily. Pt takes 1/2 tablet in the morning and 1/2 tablet in the evening    Historical Provider, MD  losartan-hydrochlorothiazide (HYZAAR) 100-25 MG tablet Take 1 tablet by mouth 2 (two) times daily.    Historical Provider, MD  Multiple Vitamins-Minerals (MULTIVITAMIN WOMEN PO) Take 1 tablet by mouth daily. Pt takes Metallurgist, MD  Omega-3 Fatty Acids (FISH OIL PO) Take 1 tablet by mouth 2 (two) times daily.    Historical Provider, MD  predniSONE (DELTASONE) 20 MG tablet Take 3 po QD x 3d , then 2 po QD x 3d then 1 po QD x 3d 01/21/16   Rolland Porter, MD  simvastatin (ZOCOR) 20 MG tablet Take 1 tablet by mouth daily. 07/19/11   Historical Provider, MD    Family History Family History  Problem Relation Age of Onset  . Heart attack Mother   . Colon cancer Father     Social History Social History  Substance Use Topics  . Smoking status: Never Smoker  . Smokeless tobacco: Never Used  . Alcohol use No     Allergies   Review of patient's allergies indicates no known allergies.   Review of Systems Review of Systems  Genitourinary: Negative for difficulty urinating and dysuria.  Musculoskeletal: Positive for arthralgias and myalgias.  Neurological:       +paraesthesias  All other systems reviewed and are negative.    Physical Exam Updated Vital Signs BP 160/96 (BP Location: Right Arm)   Pulse 120   Temp 98.7 F (37.1 C) (Oral)   Resp 23   SpO2 98%   Physical Exam  Constitutional: She appears well-developed and well-nourished. No distress.  HENT:  Head: Normocephalic and atraumatic.  Eyes: Conjunctivae are normal.  Neck: Neck supple.  Cardiovascular: Normal rate, regular rhythm, normal heart sounds and intact distal pulses.   Pulmonary/Chest: Effort normal and breath sounds normal. No respiratory distress.  Abdominal: Soft. There is no tenderness. There is no guarding.  Musculoskeletal: She exhibits tenderness. She exhibits no edema.  Motor intact to L  foot. Tenderness to the posterior left buttock into the posterior L thigh.   Lymphadenopathy:    She has no cervical adenopathy.  Neurological: She is alert.  Paraesthesias to the toes of the L foot. Strength with extension and flexion at the knee is 5/5. Strength 5 out of 5 in all cardinal directions of the left hip. Observed to ambulate unassisted with a slight limping gait.   Skin: Skin is warm and dry. She is not diaphoretic.  Intact DP.  Psychiatric: She has a normal mood and affect. Her behavior is normal.  Nursing note and vitals reviewed.    ED Treatments / Results  DIAGNOSTIC STUDIES:  Oxygen Saturation is  98% on RA, normal by my interpretation.    COORDINATION OF CARE:  7:19 PM Discussed treatment plan with pt at bedside and pt agreed to plan.  Labs (all labs ordered are listed, but only abnormal results are displayed) Labs Reviewed - No data to display  EKG  EKG Interpretation None       Radiology No results found.  Procedures Procedures (including critical care time)  Medications Ordered in ED Medications  gabapentin (NEURONTIN) capsule 300 mg (300 mg Oral Given 01/22/16 1933)  morphine 4 MG/ML injection 4 mg (4 mg Intramuscular Given 01/22/16 1933)     Initial Impression / Assessment and Plan / ED Course  I have reviewed the triage vital signs and the nursing notes.  Pertinent labs & imaging results that were available during my care of the patient were reviewed by me and considered in my medical decision making (see chart for details).  Clinical Course       Patient presents with left hip pain in a pattern consistent with sciatica. A chart review reveals that the patient was seen at [facility] on October 30, given toradol injection, a three day course of prednisone, and hydrocodone. Patient was seen at [facility] last night, diagnosed with sciatica and given 10 MG of IM decadron, 60 Mg of IM Toradol and 10 MG of diazepam  Patient was given prescriptions for  additional prednisone and Flexeril. Patient was offered to be scheduled for an MRI at that time, but she declined stating that she would prefer to see her orthopedist, Dr. Ninfa Linden, at her already scheduled appointment on November 22.  I also suspect sciatica for this patient. We will try a shift in therapy. Add Neurontin to the patient's regimen. Patient given a prescription for outpatient MRI. Patient now noted to have an earlier appointment scheduled at [facility] orthopedics with Dr. Erlinda Hong. The patient was given instructions for home care as well as return precautions. Patient voices understanding of these instructions, accepts the plan, and is comfortable with discharge.  Findings and plan of care discussed with Carmin Muskrat, MD. Dr. Vanita Panda personally evaluated and examined this patient.  Vitals:   01/22/16 1844 01/22/16 2033  BP: 160/96 161/90  Pulse: 120 103  Resp: 23 19  Temp: 98.7 F (37.1 C)   TempSrc: Oral   SpO2: 98% 97%     Final Clinical Impressions(s) / ED Diagnoses   Final diagnoses:  Sciatica of left side    New Prescriptions Discharge Medication List as of 01/22/2016  8:15 PM    START taking these medications   Details  gabapentin (NEURONTIN) 300 MG capsule Take 1 capsule (300 mg total) by mouth 2 (two) times daily., Starting Thu 01/22/2016, Print       I personally performed the services described in this documentation, which was scribed in my presence. The recorded information has been reviewed and is accurate.     Lorayne Bender, PA-C 01/22/16 2038    Lorayne Bender, PA-C 01/22/16 2038    Carmin Muskrat, MD 01/26/16 1147

## 2016-01-22 NOTE — ED Triage Notes (Signed)
Pt presents to ED for assessment of left hip pain, left leg pain and "numbness in the feet".  Pt sts she was seen at [facility] on Monday and given a Cortisone injection and diagnosed with Sciatica.  Pt sts she was then seen at [facility] last night, given three more shots without relief.  Pt sts pain is still bad today, and pt has not been able to sleep since Monday.

## 2016-01-22 NOTE — ED Notes (Signed)
Pt stable, ambulatory, states understanding of discharge instructions 

## 2016-01-22 NOTE — Discharge Instructions (Signed)
Try taking the gabapentin twice a day to decrease the nerve pain. Follow-up with orthopedics, as scheduled, on November 7. Keep off of the leg as much as possible.

## 2016-01-25 NOTE — Progress Notes (Signed)
Subjective:     Patient ID: [place], female   DOB: 1946-03-11, 70 y.o.   MRN: [id number]  HPI patient states my foot is feeling a lot better   Review of Systems     Objective:   Physical Exam Neurovascular status intact with significant diminishment of forefoot discomfort left with mild discomfort with palpation    Assessment:     Improved capsulitis with moderate mild discomfort still noted    Plan:     Reviewed continuation of supportive shoes and not going barefoot. Reappoint as needed and instructed other things we may have to do long-term

## 2016-01-27 ENCOUNTER — Ambulatory Visit (INDEPENDENT_AMBULATORY_CARE_PROVIDER_SITE_OTHER): Payer: Medicare Other | Admitting: Orthopaedic Surgery

## 2016-01-27 DIAGNOSIS — M5442 Lumbago with sciatica, left side: Secondary | ICD-10-CM | POA: Diagnosis not present

## 2016-01-27 DIAGNOSIS — Z6834 Body mass index (BMI) 34.0-34.9, adult: Secondary | ICD-10-CM | POA: Diagnosis not present

## 2016-02-02 DIAGNOSIS — M47817 Spondylosis without myelopathy or radiculopathy, lumbosacral region: Secondary | ICD-10-CM | POA: Diagnosis not present

## 2016-02-02 DIAGNOSIS — M47816 Spondylosis without myelopathy or radiculopathy, lumbar region: Secondary | ICD-10-CM | POA: Diagnosis not present

## 2016-02-02 DIAGNOSIS — M5117 Intervertebral disc disorders with radiculopathy, lumbosacral region: Secondary | ICD-10-CM | POA: Diagnosis not present

## 2016-02-02 DIAGNOSIS — M5116 Intervertebral disc disorders with radiculopathy, lumbar region: Secondary | ICD-10-CM | POA: Diagnosis not present

## 2016-02-09 DIAGNOSIS — Z6836 Body mass index (BMI) 36.0-36.9, adult: Secondary | ICD-10-CM | POA: Diagnosis not present

## 2016-02-09 DIAGNOSIS — M5442 Lumbago with sciatica, left side: Secondary | ICD-10-CM | POA: Diagnosis not present

## 2016-02-09 DIAGNOSIS — I10 Essential (primary) hypertension: Secondary | ICD-10-CM | POA: Diagnosis not present

## 2016-02-11 ENCOUNTER — Ambulatory Visit (INDEPENDENT_AMBULATORY_CARE_PROVIDER_SITE_OTHER): Payer: Medicare Other | Admitting: Orthopaedic Surgery

## 2016-02-16 ENCOUNTER — Ambulatory Visit
Admission: RE | Admit: 2016-02-16 | Discharge: 2016-02-16 | Disposition: A | Discharge status: Home / Self Care | Payer: Medicare Other | Source: Ambulatory Visit | Attending: Internal Medicine | Admitting: Internal Medicine

## 2016-02-16 DIAGNOSIS — Z1231 Encounter for screening mammogram for malignant neoplasm of breast: Secondary | ICD-10-CM

## 2016-03-04 DIAGNOSIS — Z6835 Body mass index (BMI) 35.0-35.9, adult: Secondary | ICD-10-CM | POA: Diagnosis not present

## 2016-03-04 DIAGNOSIS — E119 Type 2 diabetes mellitus without complications: Secondary | ICD-10-CM | POA: Diagnosis not present

## 2016-03-04 DIAGNOSIS — I10 Essential (primary) hypertension: Secondary | ICD-10-CM | POA: Diagnosis not present

## 2016-03-08 ENCOUNTER — Other Ambulatory Visit: Payer: Self-pay | Admitting: Neurosurgery

## 2016-03-08 DIAGNOSIS — M542 Cervicalgia: Secondary | ICD-10-CM

## 2016-03-20 ENCOUNTER — Ambulatory Visit
Admission: RE | Admit: 2016-03-20 | Discharge: 2016-03-20 | Disposition: A | Discharge status: Home / Self Care | Payer: Medicare Other | Source: Ambulatory Visit | Attending: Neurosurgery | Admitting: Neurosurgery

## 2016-03-20 DIAGNOSIS — M542 Cervicalgia: Secondary | ICD-10-CM

## 2016-03-20 DIAGNOSIS — M50223 Other cervical disc displacement at C6-C7 level: Secondary | ICD-10-CM | POA: Diagnosis not present

## 2016-04-05 DIAGNOSIS — R0602 Shortness of breath: Secondary | ICD-10-CM | POA: Diagnosis not present

## 2016-04-05 DIAGNOSIS — J019 Acute sinusitis, unspecified: Secondary | ICD-10-CM | POA: Diagnosis not present

## 2016-04-05 DIAGNOSIS — R05 Cough: Secondary | ICD-10-CM | POA: Diagnosis not present

## 2016-04-06 DIAGNOSIS — Z6835 Body mass index (BMI) 35.0-35.9, adult: Secondary | ICD-10-CM | POA: Diagnosis not present

## 2016-04-06 DIAGNOSIS — M542 Cervicalgia: Secondary | ICD-10-CM | POA: Diagnosis not present

## 2016-04-06 DIAGNOSIS — M4722 Other spondylosis with radiculopathy, cervical region: Secondary | ICD-10-CM | POA: Diagnosis not present

## 2016-04-06 DIAGNOSIS — M545 Low back pain: Secondary | ICD-10-CM | POA: Diagnosis not present

## 2016-04-06 DIAGNOSIS — I10 Essential (primary) hypertension: Secondary | ICD-10-CM | POA: Diagnosis not present

## 2016-04-15 DIAGNOSIS — J209 Acute bronchitis, unspecified: Secondary | ICD-10-CM | POA: Diagnosis not present

## 2016-04-15 DIAGNOSIS — Z6835 Body mass index (BMI) 35.0-35.9, adult: Secondary | ICD-10-CM | POA: Diagnosis not present

## 2016-04-15 DIAGNOSIS — J019 Acute sinusitis, unspecified: Secondary | ICD-10-CM | POA: Diagnosis not present

## 2016-04-30 DIAGNOSIS — M2560 Stiffness of unspecified joint, not elsewhere classified: Secondary | ICD-10-CM | POA: Diagnosis not present

## 2016-04-30 DIAGNOSIS — M545 Low back pain: Secondary | ICD-10-CM | POA: Diagnosis not present

## 2016-04-30 DIAGNOSIS — M542 Cervicalgia: Secondary | ICD-10-CM | POA: Diagnosis not present

## 2016-04-30 DIAGNOSIS — R293 Abnormal posture: Secondary | ICD-10-CM | POA: Diagnosis not present

## 2016-05-03 DIAGNOSIS — M2560 Stiffness of unspecified joint, not elsewhere classified: Secondary | ICD-10-CM | POA: Diagnosis not present

## 2016-05-03 DIAGNOSIS — M545 Low back pain: Secondary | ICD-10-CM | POA: Diagnosis not present

## 2016-05-03 DIAGNOSIS — M542 Cervicalgia: Secondary | ICD-10-CM | POA: Diagnosis not present

## 2016-05-03 DIAGNOSIS — R293 Abnormal posture: Secondary | ICD-10-CM | POA: Diagnosis not present

## 2016-05-04 DIAGNOSIS — R293 Abnormal posture: Secondary | ICD-10-CM | POA: Diagnosis not present

## 2016-05-04 DIAGNOSIS — M545 Low back pain: Secondary | ICD-10-CM | POA: Diagnosis not present

## 2016-05-04 DIAGNOSIS — M542 Cervicalgia: Secondary | ICD-10-CM | POA: Diagnosis not present

## 2016-05-04 DIAGNOSIS — M2560 Stiffness of unspecified joint, not elsewhere classified: Secondary | ICD-10-CM | POA: Diagnosis not present

## 2016-05-06 DIAGNOSIS — M545 Low back pain: Secondary | ICD-10-CM | POA: Diagnosis not present

## 2016-05-06 DIAGNOSIS — M2560 Stiffness of unspecified joint, not elsewhere classified: Secondary | ICD-10-CM | POA: Diagnosis not present

## 2016-05-06 DIAGNOSIS — R293 Abnormal posture: Secondary | ICD-10-CM | POA: Diagnosis not present

## 2016-05-06 DIAGNOSIS — M542 Cervicalgia: Secondary | ICD-10-CM | POA: Diagnosis not present

## 2016-05-10 DIAGNOSIS — R293 Abnormal posture: Secondary | ICD-10-CM | POA: Diagnosis not present

## 2016-05-10 DIAGNOSIS — M2560 Stiffness of unspecified joint, not elsewhere classified: Secondary | ICD-10-CM | POA: Diagnosis not present

## 2016-05-10 DIAGNOSIS — M542 Cervicalgia: Secondary | ICD-10-CM | POA: Diagnosis not present

## 2016-05-10 DIAGNOSIS — M545 Low back pain: Secondary | ICD-10-CM | POA: Diagnosis not present

## 2016-05-13 DIAGNOSIS — R293 Abnormal posture: Secondary | ICD-10-CM | POA: Diagnosis not present

## 2016-05-13 DIAGNOSIS — M2560 Stiffness of unspecified joint, not elsewhere classified: Secondary | ICD-10-CM | POA: Diagnosis not present

## 2016-05-13 DIAGNOSIS — M542 Cervicalgia: Secondary | ICD-10-CM | POA: Diagnosis not present

## 2016-05-13 DIAGNOSIS — M545 Low back pain: Secondary | ICD-10-CM | POA: Diagnosis not present

## 2016-05-17 ENCOUNTER — Ambulatory Visit (INDEPENDENT_AMBULATORY_CARE_PROVIDER_SITE_OTHER): Payer: Medicare Other | Admitting: Orthopedic Surgery

## 2016-05-17 ENCOUNTER — Encounter (INDEPENDENT_AMBULATORY_CARE_PROVIDER_SITE_OTHER): Payer: Self-pay | Admitting: Orthopedic Surgery

## 2016-05-17 ENCOUNTER — Ambulatory Visit (INDEPENDENT_AMBULATORY_CARE_PROVIDER_SITE_OTHER): Payer: Medicare Other

## 2016-05-17 DIAGNOSIS — M25511 Pain in right shoulder: Secondary | ICD-10-CM | POA: Diagnosis not present

## 2016-05-17 DIAGNOSIS — R293 Abnormal posture: Secondary | ICD-10-CM | POA: Diagnosis not present

## 2016-05-17 DIAGNOSIS — M542 Cervicalgia: Secondary | ICD-10-CM | POA: Diagnosis not present

## 2016-05-17 DIAGNOSIS — M2560 Stiffness of unspecified joint, not elsewhere classified: Secondary | ICD-10-CM | POA: Diagnosis not present

## 2016-05-17 DIAGNOSIS — M545 Low back pain: Secondary | ICD-10-CM | POA: Diagnosis not present

## 2016-05-17 NOTE — Progress Notes (Signed)
Office Visit Note   Patient: [name]           Date of Birth: 1946-02-21           MRN: [id number] Visit Date: 05/17/2016 Requested by: Burnard Bunting, MD [address] [place], [place] [address] PCP: Geoffery Lyons, MD  Subjective: Chief Complaint  Patient presents with  . Right Shoulder - Pain    HPI [name] is a 71 year old patient with right shoulder pain of 8 week duration.  Denies any history of injury.  Localizes the pain on the superior aspect of the shoulder.  She states that she has full range of motion cyst painful.  She has a history of arthroscopy and subacromial decompression done many years ago.  Takes Aleve without much relief.  She is in physical therapy for her neck.  She is retired.  Also describes that the pain is migrated from the superior aspect of her shoulder to the deltoid region.  She sleeps on her back.  The pain does bother her at night.              Review of Systems All systems reviewed are negative as they relate to the chief complaint within the history of present illness.  Patient denies  fevers or chills.    Assessment & Plan: Visit Diagnoses:  1. Acute pain of right shoulder     Plan: Impression is right shoulder acromioclavicular joint arthritis.  Plan is ultrasound-guided injection.  We'll see how she does with that intervention and then see her back in 4 weeks for repeat assessment.  Could consider further imaging and/or injection at that time as well.  Rotator cuff looked reasonable and the biceps tendon does not test as if it is adversely affected  Follow-Up Instructions: No Follow-up on file.   Orders:  Orders Placed This Encounter  Procedures  . XR Shoulder Right   No orders of the defined types were placed in this encounter.     Procedures: Medium Joint Inj Date/Time: 05/17/2016 4:36 PM Performed by: Meredith Pel Authorized by: Meredith Pel   Consent Given by:  Patient Site marked: the procedure  site was marked   Timeout: prior to procedure the correct patient, procedure, and site was verified   Indications:  Pain and diagnostic evaluation Location:  Shoulder Site:  R acromioclavicular Prep: patient was prepped and draped in usual sterile fashion   Needle Size:  25 G Needle Length:  1.5 inches Approach:  Superior Ultrasound Guided: Yes   Fluoroscopic Guidance: No   Medications:  3 mL lidocaine 1 %; 20 mg triamcinolone acetonide 40 MG/ML; 0.66 mL bupivacaine 0.25 % Aspiration Attempted: No   Patient tolerance:  Patient tolerated the procedure well with no immediate complications   [id number]    Clinical Data: No additional findings.  Objective: Vital Signs: There were no vitals taken for this visit.  Physical Exam   Constitutional: Patient appears well-developed HEENT:  Head: Normocephalic Eyes:EOM are normal Neck: Normal range of motion Cardiovascular: Normal rate Pulmonary/chest: Effort normal Neurologic: Patient is alert Skin: Skin is warm Psychiatric: Patient has normal mood and affect    Ortho Exam orthopedic exam demonstrates full active and passive range of motion.  This involves the right shoulder.  There is acromioclavicular joint tenderness on the right versus the left.  Rotator cuff strength is intact to isolated and status x-rays subscap muscle testing.  No other masses lymph adenopathy or skin changes noted in the shoulder girdle  region.  O'Brien's testing negative on the right negative on the left impingement signs equivocal on the right negative on the left  Specialty Comments:  No specialty comments available.  Imaging: Xr Shoulder Right  Result Date: 05/17/2016 AP outlet and axillary lateral right shoulder reviewed.  Acromiohumeral distance is maintained.  There has been prior distal clavicle excision with some regrowth of a superior spur noted.  Shoulder joint is reduced.  Visualized lung fields are clear.  No other degenerative changes  noted.    PMFS History: Patient Active Problem List   Diagnosis Date Noted  . Chronic insomnia 01/28/2011  . Seasonal and perennial allergic rhinitis 05/28/2010  . Lung nodule 05/28/2010  . HYPERLIPIDEMIA 11/23/2009  . G E R D 11/23/2009  . EUSTACHIAN TUBE DYSFUNCTION 11/18/2009  . HYPERTENSION 11/18/2009  . Unspecified hypothyroidism 03/14/2007  . Obstructive sleep apnea 03/14/2007  . FIBROMYALGIA 03/14/2007   Past Medical History:  Diagnosis Date  . DM (diabetes mellitus) ([place]) 2013   insulin  . Eustachian tube dysfunction   . GERD (gastroesophageal reflux disease)   . Hyperlipidemia   . Hypertension   . Hypothyroid   . Lung nodule    CT abd by [facility] 09/29/2009  . OSA (obstructive sleep apnea)   . Osteopenia     Family History  Problem Relation Age of Onset  . Heart attack Mother   . Colon cancer Father     Past Surgical History:  Procedure Laterality Date  . ANKLE SURGERY    . Anteriorvesicourethropexy    . BUNIONECTOMY    . COLONOSCOPY    . NASAL SINUS SURGERY    . PATELLA FRACTURE SURGERY  2009   Right  . TONSILLECTOMY    . TOTAL ABDOMINAL HYSTERECTOMY    . WRIST FRACTURE SURGERY  2009   Social History   Occupational History  . Disability    Social History Main Topics  . Smoking status: Never Smoker  . Smokeless tobacco: Never Used  . Alcohol use No  . Drug use: No  . Sexual activity: Not on file

## 2016-05-19 MED ORDER — TRIAMCINOLONE ACETONIDE 40 MG/ML IJ SUSP
20.0000 mg | INTRAMUSCULAR | Status: AC | PRN
  Administered 2016-05-17: 20 mg via INTRA_ARTICULAR

## 2016-05-19 MED ORDER — LIDOCAINE HCL 1 % IJ SOLN
3.0000 mL | INTRAMUSCULAR | Status: AC | PRN
  Administered 2016-05-17: 3 mL

## 2016-05-19 MED ORDER — BUPIVACAINE HCL 0.25 % IJ SOLN
0.6600 mL | INTRAMUSCULAR | Status: AC | PRN
  Administered 2016-05-17: .66 mL via INTRA_ARTICULAR

## 2016-05-21 DIAGNOSIS — R293 Abnormal posture: Secondary | ICD-10-CM | POA: Diagnosis not present

## 2016-05-21 DIAGNOSIS — M2560 Stiffness of unspecified joint, not elsewhere classified: Secondary | ICD-10-CM | POA: Diagnosis not present

## 2016-05-21 DIAGNOSIS — M542 Cervicalgia: Secondary | ICD-10-CM | POA: Diagnosis not present

## 2016-05-21 DIAGNOSIS — M545 Low back pain: Secondary | ICD-10-CM | POA: Diagnosis not present

## 2016-05-24 DIAGNOSIS — M2560 Stiffness of unspecified joint, not elsewhere classified: Secondary | ICD-10-CM | POA: Diagnosis not present

## 2016-05-24 DIAGNOSIS — M542 Cervicalgia: Secondary | ICD-10-CM | POA: Diagnosis not present

## 2016-05-24 DIAGNOSIS — M545 Low back pain: Secondary | ICD-10-CM | POA: Diagnosis not present

## 2016-05-24 DIAGNOSIS — R293 Abnormal posture: Secondary | ICD-10-CM | POA: Diagnosis not present

## 2016-05-26 DIAGNOSIS — M2560 Stiffness of unspecified joint, not elsewhere classified: Secondary | ICD-10-CM | POA: Diagnosis not present

## 2016-05-26 DIAGNOSIS — M545 Low back pain: Secondary | ICD-10-CM | POA: Diagnosis not present

## 2016-05-26 DIAGNOSIS — M542 Cervicalgia: Secondary | ICD-10-CM | POA: Diagnosis not present

## 2016-05-26 DIAGNOSIS — R293 Abnormal posture: Secondary | ICD-10-CM | POA: Diagnosis not present

## 2016-06-01 DIAGNOSIS — M2560 Stiffness of unspecified joint, not elsewhere classified: Secondary | ICD-10-CM | POA: Diagnosis not present

## 2016-06-01 DIAGNOSIS — R293 Abnormal posture: Secondary | ICD-10-CM | POA: Diagnosis not present

## 2016-06-01 DIAGNOSIS — M545 Low back pain: Secondary | ICD-10-CM | POA: Diagnosis not present

## 2016-06-01 DIAGNOSIS — M542 Cervicalgia: Secondary | ICD-10-CM | POA: Diagnosis not present

## 2016-06-02 DIAGNOSIS — M50121 Cervical disc disorder at C4-C5 level with radiculopathy: Secondary | ICD-10-CM | POA: Diagnosis not present

## 2016-06-02 DIAGNOSIS — M5126 Other intervertebral disc displacement, lumbar region: Secondary | ICD-10-CM | POA: Diagnosis not present

## 2016-06-02 DIAGNOSIS — Z794 Long term (current) use of insulin: Secondary | ICD-10-CM | POA: Diagnosis not present

## 2016-06-02 DIAGNOSIS — M4712 Other spondylosis with myelopathy, cervical region: Secondary | ICD-10-CM | POA: Diagnosis not present

## 2016-06-02 DIAGNOSIS — E119 Type 2 diabetes mellitus without complications: Secondary | ICD-10-CM | POA: Diagnosis not present

## 2016-06-02 DIAGNOSIS — M50022 Cervical disc disorder at C5-C6 level with myelopathy: Secondary | ICD-10-CM | POA: Diagnosis not present

## 2016-06-02 DIAGNOSIS — M4802 Spinal stenosis, cervical region: Secondary | ICD-10-CM | POA: Diagnosis not present

## 2016-06-02 DIAGNOSIS — M4186 Other forms of scoliosis, lumbar region: Secondary | ICD-10-CM | POA: Diagnosis not present

## 2016-06-02 DIAGNOSIS — M5386 Other specified dorsopathies, lumbar region: Secondary | ICD-10-CM | POA: Diagnosis not present

## 2016-06-03 DIAGNOSIS — M2560 Stiffness of unspecified joint, not elsewhere classified: Secondary | ICD-10-CM | POA: Diagnosis not present

## 2016-06-03 DIAGNOSIS — M542 Cervicalgia: Secondary | ICD-10-CM | POA: Diagnosis not present

## 2016-06-03 DIAGNOSIS — R293 Abnormal posture: Secondary | ICD-10-CM | POA: Diagnosis not present

## 2016-06-03 DIAGNOSIS — M545 Low back pain: Secondary | ICD-10-CM | POA: Diagnosis not present

## 2016-06-09 DIAGNOSIS — E119 Type 2 diabetes mellitus without complications: Secondary | ICD-10-CM | POA: Diagnosis not present

## 2016-06-09 DIAGNOSIS — I10 Essential (primary) hypertension: Secondary | ICD-10-CM | POA: Diagnosis not present

## 2016-06-09 DIAGNOSIS — Z6836 Body mass index (BMI) 36.0-36.9, adult: Secondary | ICD-10-CM | POA: Diagnosis not present

## 2016-06-10 ENCOUNTER — Ambulatory Visit (HOSPITAL_COMMUNITY): Payer: Medicare Other | Attending: Neurosurgery | Admitting: Physical Therapy

## 2016-06-10 ENCOUNTER — Encounter (HOSPITAL_COMMUNITY): Payer: Self-pay | Admitting: Physical Therapy

## 2016-06-10 DIAGNOSIS — M545 Low back pain, unspecified: Secondary | ICD-10-CM

## 2016-06-10 DIAGNOSIS — R29898 Other symptoms and signs involving the musculoskeletal system: Secondary | ICD-10-CM | POA: Diagnosis not present

## 2016-06-10 DIAGNOSIS — M6281 Muscle weakness (generalized): Secondary | ICD-10-CM | POA: Diagnosis not present

## 2016-06-10 DIAGNOSIS — R252 Cramp and spasm: Secondary | ICD-10-CM

## 2016-06-10 DIAGNOSIS — M542 Cervicalgia: Secondary | ICD-10-CM

## 2016-06-10 DIAGNOSIS — R293 Abnormal posture: Secondary | ICD-10-CM

## 2016-06-10 DIAGNOSIS — G8929 Other chronic pain: Secondary | ICD-10-CM | POA: Diagnosis not present

## 2016-06-10 NOTE — Therapy (Signed)
[place], [place], [address] Phone: [phone]   Fax:  [phone]  Physical Therapy Evaluation  Patient Details  Name: [name] MRN: [id number] Date of Birth: 03-Mar-1946 Referring Provider: Newman Pies for neck/Melissa Unice Bailey for lumbar   Encounter Date: 06/10/2016      PT End of Session - 06/10/16 1000    Visit Number 1   Number of Visits 17   Date for PT Re-Evaluation 07/08/16   Authorization Type Medicare and [employer]    Authorization Time Period 06/10/16 to 08/10/16   Authorization - Visit Number 1   Authorization - Number of Visits 10   PT Start Time 0900   PT Stop Time 0944   PT Time Calculation (min) 44 min   Activity Tolerance Patient tolerated treatment well   Behavior During Therapy [facility] for tasks assessed/performed      Past Medical History:  Diagnosis Date  . DM (diabetes mellitus) ([place]) 2013   insulin  . Eustachian tube dysfunction   . GERD (gastroesophageal reflux disease)   . Hyperlipidemia   . Hypertension   . Hypothyroid   . Lung nodule    CT abd by [facility] 09/29/2009  . OSA (obstructive sleep apnea)   . Osteopenia     Past Surgical History:  Procedure Laterality Date  . ANKLE SURGERY    . Anteriorvesicourethropexy    . BUNIONECTOMY    . COLONOSCOPY    . NASAL SINUS SURGERY    . PATELLA FRACTURE SURGERY  2009   Right  . TONSILLECTOMY    . TOTAL ABDOMINAL HYSTERECTOMY    . WRIST FRACTURE SURGERY  2009    There were no vitals filed for this visit.       Subjective Assessment - 06/10/16 0911    Subjective Patient arrives with referrals for her neck and low back from different MDs. She states that both areas bother her quite a bit. She has noticed for years that her low back has hurt her for quite some time especially with vacuuming, her neck has hurt her for quite some time too espeically when she does housework. Her neck pain goes from  the base of her neck down her R arm. She also has several toes on her L foot and fingers on her L hand that are numb, but her pain is on the R for the back and the neck although lumbar pain goes all the way across. No acute bowel or bladder changes. No falls recently but R leg does feel like it wants to give away.    Pertinent History HTN, hypothyroidism, DM, fibromyalgia, ankle surgery, R knee surgery    How long can you sit comfortably? 3/22- 30 minutes    How long can you stand comfortably? 3/22- 20 minutes    How long can you walk comfortably? 3/22- 20 minutes    Patient Stated Goals reduce pain, be able to walk longer distances to go shopping, be able to walk more when she first gets up   Currently in Pain? Yes   Pain Score --  4/10 for neck/R UE, 8/10 for back    Pain Location Other (Comment)  neck and lumbar spine    Pain Orientation Right;Left   Pain Descriptors / Indicators Constant;Spasm;Tightness;Pressure   Pain Type Chronic pain   Pain Radiating Towards neck pain radiates down into R UE to elbow, radiation out to hips in low back  Pain Onset More than a month ago   Pain Frequency Constant   Aggravating Factors  cleaning house, walking    Pain Relieving Factors heat   Effect of Pain on Daily Activities severe impact             [facility] PT Assessment - 06/10/16 0001      Assessment   Medical Diagnosis cervical spondylosis/lumbar pain    Referring Provider Newman Pies for neck/Melissa Unice Bailey for lumbar    Onset Date/Surgical Date --  chronic    Next MD Visit Dr. Arnoldo Morale March 28th; Dr. Vertell Limber in 3 more weeks    Prior Therapy had PT for neck in [place] but transferred to here due to distance from clinic      Precautions   Precautions None     Balance Screen   Has the patient fallen in the past 6 months No   Has the patient had a decrease in activity level because of a fear of falling?  No   Is the patient reluctant to leave their home because of a fear of  falling?  No     Prior Function   Level of Independence Independent;Independent with basic ADLs;Independent with gait;Independent with transfers   Vocation Retired     Observation/Other Assessments   Observations mild + empty can test R, drop arm test negative B; scour and FABER negative B, SLR positive R       AROM   Right Shoulder Flexion --  WFL but R shoulder pain    Right Shoulder ABduction --  WNL but painful    Right Shoulder Internal Rotation --  approx T8   Right Shoulder External Rotation --  approx T2    Left Shoulder Flexion --  WNL    Left Shoulder ABduction --  WNL but painful    Left Shoulder Internal Rotation --  approx T8   Left Shoulder External Rotation --  approx T1    Cervical Flexion 44   Cervical Extension 25   Cervical - Right Side Bend 14   Cervical - Left Side Bend 20   Cervical - Right Rotation 51   Cervical - Left Rotation 54   Lumbar Flexion approx 3 inches above floor; RFIS increases lumbar pain    Lumbar Extension min limitation; REIS increases local pain    Lumbar - Right Side Bend mod limitation    Lumbar - Left Side Bend mod limitation    Thoracic Flexion WFL    Thoracic Extension min limitation    Thoracic - Right Side Bend WFL    Thoracic - Left Side Bend mod limitation    Thoracic - Right Rotation min limitation    Thoracic - Left Rotation mod limitation      Strength   Right Shoulder Flexion 4+/5   Right Shoulder ABduction 4/5   Right Shoulder Internal Rotation 4+/5   Right Shoulder External Rotation 4/5   Left Shoulder Flexion 5/5   Left Shoulder ABduction 5/5   Left Shoulder Internal Rotation 5/5   Left Shoulder External Rotation 4+/5   Right Hip Flexion 4/5   Right Hip Extension 4/5   Right Hip ABduction 4+/5   Left Hip Flexion 4+/5   Left Hip Extension 4+/5   Left Hip ABduction 5/5   Right Knee Flexion 4+/5   Right Knee Extension 5/5   Left Knee Flexion 4+/5   Left Knee Extension 4+/5   Right Ankle Dorsiflexion  4+/5   Left Ankle Dorsiflexion  4+/5     Flexibility   Hamstrings WNL R, mild limitation L LE    Piriformis mild limitation R, mod limitation L                            PT Education - 06/10/16 0959    Education provided Yes   Education Details prognosis, POC, HEP to be given next session due to time limitations today; CNS interpretation of chronic pain, recommendations for water exercise    Person(s) Educated Patient   Methods Explanation   Comprehension Verbalized understanding          PT Short Term Goals - 06/10/16 1004      PT SHORT TERM GOAL #1   Title Patient to demonstrate lumbar/thoracic/cervical ROM as being full and without pain in order to improve mechanics and improve QOL    Time 4   Period Weeks   Status New     PT SHORT TERM GOAL #2   Title Patient to demonstrate improvement in bilateral hip IR/ER of at least 50% in order to improve mechanics and reduce pain    Time 4   Period Weeks   Status New     PT SHORT TERM GOAL #3   Title Patient to report cervical pain as being no more than 2/10 and lumbar pain being no more than 5/10 in order to improve QOL and functional task tolerance    Time 4   Period Weeks   Status New     PT SHORT TERM GOAL #4   Title Patient to be able to maintain correct posture at least 70% of the time without cuing as well as demonstrate correct functional mechancis for housekeeping tasks in order to reduce pain with functional activities    Time 4   Period Weeks   Status New     PT SHORT TERM GOAL #5   Title Patient to be independent in correctly and consistently performing targeted HEP, to be updated as appropriate and tolerated    Time 4   Period Weeks   Status New           PT Long Term Goals - 06/10/16 1007      PT LONG TERM GOAL #1   Title Patient to demonstrate functional strength as being 5/5 in order to reduce pain and improve functional task tolerance    Time 8   Period Weeks   Status New      PT LONG TERM GOAL #2   Title Patient to demonstrate pain as being 2/10 in cervical and lumbar areas in order to improve QOL and functional task tolerance    Time 8   Period Weeks   Status New     PT LONG TERM GOAL #3   Title Patient to report she has been able to sleep through the night without waking due to pain in order to improve QOL    Time 8   Period Weeks   Status New     PT LONG TERM GOAL #4   Title Patient to report she has been able to go shopping and perform all functional tasks in her home without pain exacerbation in order to improve QOL    Time 8   Period Weeks   Status New               Plan - 06/10/16 1001    Clinical Impression Statement Patient arrives today with referrals  for both chronic neck and back pain; she is unable to prioritize one over the other for skilled PT intervention as both have severe impact on her QOL and function. Examination reveals generalized stiffness, functional muscle weakness, postural limitations, impaired muscle flexibility, and reduced ability to tolerate functional task performance throughout her day. Pain from both areas currently is a severely limiting factor. Of note, she does have pain more intense on the R side but numbness in her fingers and toes on the left- this will require further investigation and sensory/proprioception testing. Also suspect that chronic compensation patterns have played a role in development/progression of general pain and movement dysfunction.  Recommend skilled PT services in order to address functional limitations, address pain, and work to optimize overall level of function.    Rehab Potential Fair   Clinical Impairments Affecting Rehab Potential (+) appears motivated to participate in PT, had success in managing pain with exercise in the past; (-) chronicity and extent of pain, fibromyalgia    PT Frequency 2x / week   PT Duration 8 weeks   PT Treatment/Interventions ADLs/Self Care Home  Management;Biofeedback;Cryotherapy;Electrical Stimulation;Moist Heat;Ultrasound;Traction;DME Instruction;Gait training;Stair training;Functional mobility training;Therapeutic activities;Therapeutic exercise;Balance training;Neuromuscular re-education;Patient/family education;Manual techniques;Passive range of motion;Dry needling;Energy conservation;Taping   PT Next Visit Plan review intial eval and goals; need to test light touch sensation/sharp sensation, test cervical compression/distraction, screen balance. Assign HEP. Flexibility in general, postural training, strength as tolerated.    PT Home Exercise Plan assign 2nd session    Consulted and Agree with Plan of Care Patient      Patient will benefit from skilled therapeutic intervention in order to improve the following deficits and impairments:  Abnormal gait, Increased fascial restricitons, Improper body mechanics, Pain, Decreased coordination, Decreased mobility, Increased muscle spasms, Postural dysfunction, Decreased activity tolerance, Decreased range of motion, Decreased strength, Hypomobility, Impaired UE functional use, Difficulty walking, Impaired flexibility  Visit Diagnosis: Cervicalgia - Plan: PT plan of care cert/re-cert  Abnormal posture - Plan: PT plan of care cert/re-cert  Chronic bilateral low back pain without sciatica - Plan: PT plan of care cert/re-cert  Muscle weakness (generalized) - Plan: PT plan of care cert/re-cert  Cramp and spasm - Plan: PT plan of care cert/re-cert  Other symptoms and signs involving the musculoskeletal system - Plan: PT plan of care cert/re-cert      G-Codes - 53/64/68 1010    Functional Assessment Tool Used (Outpatient Only) Based on skilled clinical assessment of ROM, strength, pain patterns, posture, functional task tolerance    Functional Limitation Mobility: Walking and moving around   Mobility: Walking and Moving Around Current Status (E3212) At least 60 percent but less than 80  percent impaired, limited or restricted   Mobility: Walking and Moving Around Goal Status [phone]) At least 40 percent but less than 60 percent impaired, limited or restricted       Problem List Patient Active Problem List   Diagnosis Date Noted  . Chronic insomnia 01/28/2011  . Seasonal and perennial allergic rhinitis 05/28/2010  . Lung nodule 05/28/2010  . HYPERLIPIDEMIA 11/23/2009  . G E R D 11/23/2009  . EUSTACHIAN TUBE DYSFUNCTION 11/18/2009  . HYPERTENSION 11/18/2009  . Unspecified hypothyroidism 03/14/2007  . Obstructive sleep apnea 03/14/2007  . FIBROMYALGIA 03/14/2007    Deniece Ree PT, DPT [facility] [address] [place], [place], [address] Phone: [phone]   Fax:  [phone]  Name: Kamali Sakata Westall MRN: [id number] Date of Birth: 06/14/1945

## 2016-06-15 ENCOUNTER — Ambulatory Visit (HOSPITAL_COMMUNITY): Payer: Medicare Other

## 2016-06-15 ENCOUNTER — Encounter (HOSPITAL_COMMUNITY): Payer: Self-pay

## 2016-06-15 DIAGNOSIS — M545 Low back pain, unspecified: Secondary | ICD-10-CM

## 2016-06-15 DIAGNOSIS — R293 Abnormal posture: Secondary | ICD-10-CM | POA: Diagnosis not present

## 2016-06-15 DIAGNOSIS — R29898 Other symptoms and signs involving the musculoskeletal system: Secondary | ICD-10-CM

## 2016-06-15 DIAGNOSIS — M542 Cervicalgia: Secondary | ICD-10-CM | POA: Diagnosis not present

## 2016-06-15 DIAGNOSIS — G8929 Other chronic pain: Secondary | ICD-10-CM | POA: Diagnosis not present

## 2016-06-15 DIAGNOSIS — R252 Cramp and spasm: Secondary | ICD-10-CM | POA: Diagnosis not present

## 2016-06-15 DIAGNOSIS — M6281 Muscle weakness (generalized): Secondary | ICD-10-CM | POA: Diagnosis not present

## 2016-06-15 NOTE — Therapy (Signed)
[place], [place], [address] Phone: [phone]   Fax:  [phone]  Physical Therapy Treatment  Patient Details  Name: [name] MRN: [id number] Date of Birth: 03/25/1945 Referring Provider: Newman Pies for neck/Melissa Unice Bailey for lumbar   Encounter Date: 06/15/2016      PT End of Session - 06/15/16 0823    Visit Number 2   Number of Visits 17   Date for PT Re-Evaluation 07/08/16   Authorization Type Medicare and [employer]    Authorization Time Period 06/10/16 to 08/10/16   Authorization - Visit Number 2   Authorization - Number of Visits 10   PT Start Time 0818   PT Stop Time 0900   PT Time Calculation (min) 42 min   Activity Tolerance Patient tolerated treatment well   Behavior During Therapy [facility] for tasks assessed/performed      Past Medical History:  Diagnosis Date  . DM (diabetes mellitus) ([place]) 2013   insulin  . Eustachian tube dysfunction   . GERD (gastroesophageal reflux disease)   . Hyperlipidemia   . Hypertension   . Hypothyroid   . Lung nodule    CT abd by [facility] 09/29/2009  . OSA (obstructive sleep apnea)   . Osteopenia     Past Surgical History:  Procedure Laterality Date  . ANKLE SURGERY    . Anteriorvesicourethropexy    . BUNIONECTOMY    . COLONOSCOPY    . NASAL SINUS SURGERY    . PATELLA FRACTURE SURGERY  2009   Right  . TONSILLECTOMY    . TOTAL ABDOMINAL HYSTERECTOMY    . WRIST FRACTURE SURGERY  2009    There were no vitals filed for this visit.      Subjective Assessment - 06/15/16 0821    Subjective Pt states "I'm doing better than I was last night." She states she did not get any sleep last night due pain after she cleaned her house.    Pertinent History HTN, hypothyroidism, DM, fibromyalgia, ankle surgery, R knee surgery    How long can you sit comfortably? 3/22- 30 minutes    How long can you stand comfortably? 3/22- 20 minutes     How long can you walk comfortably? 3/22- 20 minutes    Patient Stated Goals reduce pain, be able to walk longer distances to go shopping, be able to walk more when she first gets up   Currently in Pain? Yes   Pain Score 3    Pain Location Hip   Pain Orientation Right;Left   Pain Descriptors / Indicators Aching   Pain Type Chronic pain   Pain Onset More than a month ago            [facility] PT Assessment - 06/15/16 0001      Observation/Other Assessments   Observations Knee Jerk and Ankle Jerk reflexes diminished on LLE; ankle jerk diminished on RLE     Sensation   Light Touch Impaired Detail   Light Touch Impaired Details Impaired RUE  C5 felt "less" on the R compared to L   Additional Comments sharp/dull WNL     Special Tests    Special Tests Cervical   Cervical Tests Spurling's;Dictraction;other     Spurling's   Findings Negative   Side Right   Comment Pt felt symptoms on L side of neck during testing of R (extension, R lateral flexion, L rotation), pain was localized in the neck with  no radicular symptoms     Distraction Test   Findngs Positive   Comment Pt felt relief upon distraction     other    Findings Negative   Comment Hoffman's negative     Balance   Balance Assessed Yes     Static Standing Balance   Static Standing - Balance Support No upper extremity supported   Static Standing Balance -  Activities  Single Leg Stance - Right Leg;Single Leg Stance - Left Leg   Static Standing - Comment/# of Minutes Rt 6 sec; Lt 14 sec           [facility] Adult PT Treatment/Exercise - 06/15/16 0001      Exercises   Exercises Lumbar;Neck     Neck Exercises: Seated   Neck Retraction 10 reps  2 sets     Lumbar Exercises: Stretches   Active Hamstring Stretch 2 reps;30 seconds  BLE   Active Hamstring Stretch Limitations supine with strap   Piriformis Stretch 2 reps;30 seconds  BLE   Piriformis Stretch Limitations supine with hands behind knee             PT Education - 06/15/16 0858    Education provided Yes   Education Details initiated HEP, proper stretching technique   Person(s) Educated Patient   Methods Explanation;Demonstration;Handout   Comprehension Verbalized understanding;Returned demonstration          PT Short Term Goals - 06/10/16 1004      PT SHORT TERM GOAL #1   Title Patient to demonstrate lumbar/thoracic/cervical ROM as being full and without pain in order to improve mechanics and improve QOL    Time 4   Period Weeks   Status New     PT SHORT TERM GOAL #2   Title Patient to demonstrate improvement in bilateral hip IR/ER of at least 50% in order to improve mechanics and reduce pain    Time 4   Period Weeks   Status New     PT SHORT TERM GOAL #3   Title Patient to report cervical pain as being no more than 2/10 and lumbar pain being no more than 5/10 in order to improve QOL and functional task tolerance    Time 4   Period Weeks   Status New     PT SHORT TERM GOAL #4   Title Patient to be able to maintain correct posture at least 70% of the time without cuing as well as demonstrate correct functional mechancis for housekeeping tasks in order to reduce pain with functional activities    Time 4   Period Weeks   Status New     PT SHORT TERM GOAL #5   Title Patient to be independent in correctly and consistently performing targeted HEP, to be updated as appropriate and tolerated    Time 4   Period Weeks   Status New           PT Long Term Goals - 06/10/16 1007      PT LONG TERM GOAL #1   Title Patient to demonstrate functional strength as being 5/5 in order to reduce pain and improve functional task tolerance    Time 8   Period Weeks   Status New     PT LONG TERM GOAL #2   Title Patient to demonstrate pain as being 2/10 in cervical and lumbar areas in order to improve QOL and functional task tolerance    Time 8   Period Weeks   Status  New     PT LONG TERM GOAL #3   Title Patient to report she  has been able to sleep through the night without waking due to pain in order to improve QOL    Time 8   Period Weeks   Status New     PT LONG TERM GOAL #4   Title Patient to report she has been able to go shopping and perform all functional tasks in her home without pain exacerbation in order to improve QOL    Time 8   Period Weeks   Status New               Plan - 06/15/16 1526    Clinical Impression Statement PT performed additional objective testing per POC from initial eval; all tests negative (see objective measures/flowsheet) except cervical distraction relieved the symptoms in her neck. Spurling's to the R caused localized pain on the L side of her neck but did not recreate any radicular or numbness symptoms. Pt is a diabetic so this numbness in her L hand and feet could be peripheral neuropathy. Pt did have deficits in SLS bilaterally, which will be addressed in future sessions. Pt tolerated BLE stretching and cervical retractions well and reported minimal to no pain at EOS. Continue with flexibility, strengthening, and postural POC as planned. Pt reported vacuuming and dusting her house yesterday and reported that she had so much pain following that she could not fall asleep. Educated pt to try sleeping with pillows under her legs since she sleeps on her back to see if that helps her pain.   Rehab Potential Fair   Clinical Impairments Affecting Rehab Potential (+) appears motivated to participate in PT, had success in managing pain with exercise in the past; (-) chronicity and extent of pain, fibromyalgia    PT Frequency 2x / week   PT Duration 8 weeks   PT Treatment/Interventions ADLs/Self Care Home Management;Biofeedback;Cryotherapy;Electrical Stimulation;Moist Heat;Ultrasound;Traction;DME Instruction;Gait training;Stair training;Functional mobility training;Therapeutic activities;Therapeutic exercise;Balance training;Neuromuscular re-education;Patient/family education;Manual  techniques;Passive range of motion;Dry needling;Energy conservation;Taping   PT Next Visit Plan HS, piriformis stretching, potentially quad stretching, hip strengthening, functional strengthening, postural education and strengthening, general mobility/flexibility; mechanics with chores as she was unable to sleep due to pain following vacuuming and dusting   PT Home Exercise Plan 3/27: bil supine HS stretch with strap, bil piriformis stretch, sleep with pillows under BLE when sleeping on back   Consulted and Agree with Plan of Care Patient      Patient will benefit from skilled therapeutic intervention in order to improve the following deficits and impairments:  Abnormal gait, Increased fascial restricitons, Improper body mechanics, Pain, Decreased coordination, Decreased mobility, Increased muscle spasms, Postural dysfunction, Decreased activity tolerance, Decreased range of motion, Decreased strength, Hypomobility, Impaired UE functional use, Difficulty walking, Impaired flexibility  Visit Diagnosis: Cervicalgia  Abnormal posture  Chronic bilateral low back pain without sciatica  Muscle weakness (generalized)  Cramp and spasm  Other symptoms and signs involving the musculoskeletal system     Problem List Patient Active Problem List   Diagnosis Date Noted  . Chronic insomnia 01/28/2011  . Seasonal and perennial allergic rhinitis 05/28/2010  . Lung nodule 05/28/2010  . HYPERLIPIDEMIA 11/23/2009  . G E R D 11/23/2009  . EUSTACHIAN TUBE DYSFUNCTION 11/18/2009  . HYPERTENSION 11/18/2009  . Unspecified hypothyroidism 03/14/2007  . Obstructive sleep apnea 03/14/2007  . FIBROMYALGIA 03/14/2007     Geraldine Solar PT, DPT   [place]  [address] [place], [place], [address] Phone: [phone]   Fax:  [phone]  Name: Kaileigh Viswanathan Cartmell MRN: [id number] Date of Birth: [birth year]

## 2016-06-15 NOTE — Patient Instructions (Signed)
  Supine Hamstring stretch with Strap  Start: Position yourself on your back with a strap or towel placed around your foot. You can bend the opposite knee up to take the pressure off of your back.  Movement: Pull on the towel to raise your leg and feel a stretch on the back of the leg  Perform 2x/day, 2-3 stretches, holding for 30-60 seconds each   PIRIFORMIS STRECH - MODIFIED 2  While lying on your back, hold your knee with one hand and your ankle with the other. Pull your leg up and over towards the opposite shoulder as shown.   Perform 2x/day, 2-3 stretches, holding for 30-60 seconds each

## 2016-06-16 ENCOUNTER — Ambulatory Visit (INDEPENDENT_AMBULATORY_CARE_PROVIDER_SITE_OTHER): Payer: Medicare Other | Admitting: Orthopedic Surgery

## 2016-06-16 ENCOUNTER — Encounter (INDEPENDENT_AMBULATORY_CARE_PROVIDER_SITE_OTHER): Payer: Self-pay | Admitting: Orthopedic Surgery

## 2016-06-16 DIAGNOSIS — M7541 Impingement syndrome of right shoulder: Secondary | ICD-10-CM

## 2016-06-16 DIAGNOSIS — M25511 Pain in right shoulder: Secondary | ICD-10-CM

## 2016-06-16 MED ORDER — METHYLPREDNISOLONE ACETATE 40 MG/ML IJ SUSP
40.0000 mg | INTRAMUSCULAR | Status: AC | PRN
  Administered 2016-06-16: 40 mg via INTRA_ARTICULAR

## 2016-06-16 MED ORDER — BUPIVACAINE HCL 0.5 % IJ SOLN
9.0000 mL | INTRAMUSCULAR | Status: AC | PRN
  Administered 2016-06-16: 9 mL via INTRA_ARTICULAR

## 2016-06-16 MED ORDER — LIDOCAINE HCL 1 % IJ SOLN
5.0000 mL | INTRAMUSCULAR | Status: AC | PRN
  Administered 2016-06-16: 5 mL

## 2016-06-16 NOTE — Progress Notes (Signed)
Office Visit Note   Patient: [name]           Date of Birth: 1945/12/27           MRN: [id number] Visit Date: 06/16/2016 Requested by: Burnard Bunting, MD [address] [place], [place] [address] PCP: Geoffery Lyons, MD  Subjective: Chief Complaint  Patient presents with  . Right Shoulder - Follow-up    HPI: [name] is a 71 year old patient with right shoulder pain.  She had had acromial clavicular joint injection the right shoulder 4 weeks ago.  That has helped the superior pain but she's having some deltoid pain as well.  She does have fibromyalgia.  The pain in her deltoid radiates down to the elbow.  In general the pain is not as bad.  She takes Aleve when needed.  Dr. Arnoldo Morale is treating her neck.  Physical therapy has been initiated and that's primarily for her neck but in general it's helping to some degree.  Activity such as vacuuming increases the pain              ROS: All systems reviewed are negative as they relate to the chief complaint within the history of present illness.  Patient denies  fevers or chills.   Assessment & Plan: Visit Diagnoses:  1. Right shoulder pain, unspecified chronicity   2. Impingement syndrome of right shoulder     Plan: Impression is improvement in right shoulder pain following acromioclavicular joint injection.  Some residual symptoms remain which are consistent with impingement syndrome.  No real rotator cuff weakness today.  Subacromial injection for impingement performed today.  I think should the symptoms recur to the point where she would consider operative intervention and we should proceed with MRI scanning to rule out rotator cuff pathology.  I'll see her back as needed  Follow-Up Instructions: Return if symptoms worsen or fail to improve.   Orders:  No orders of the defined types were placed in this encounter.  No orders of the defined types were placed in this encounter.     Procedures: Large Joint  Inj Date/Time: 06/16/2016 9:33 AM Performed by: Meredith Pel Authorized by: Meredith Pel   Consent Given by:  Patient Site marked: the procedure site was marked   Timeout: prior to procedure the correct patient, procedure, and site was verified   Indications:  Pain and diagnostic evaluation Location:  Shoulder Site:  R subacromial bursa Prep: patient was prepped and draped in usual sterile fashion   Needle Size:  18 G Needle Length:  1.5 inches Approach:  Posterior Ultrasound Guidance: No   Fluoroscopic Guidance: No   Arthrogram: No   Medications:  5 mL lidocaine 1 %; 9 mL bupivacaine 0.5 %; 40 mg methylPREDNISolone acetate 40 MG/ML Aspiration Attempted: No   Patient tolerance:  Patient tolerated the procedure well with no immediate complications     Clinical Data: No additional findings.  Objective: Vital Signs: There were no vitals taken for this visit.  Physical Exam:   Constitutional: Patient appears well-developed HEENT:  Head: Normocephalic Eyes:EOM are normal Neck: Normal range of motion Cardiovascular: Normal rate Pulmonary/chest: Effort normal Neurologic: Patient is alert Skin: Skin is warm Psychiatric: Patient has normal mood and affect    Ortho Exam: Orthopedic exam demonstrates good cervical spine range of motion full active and passive range of motion of the right shoulder compared to the left.  Diminished but somewhat still present acromioclavicular joint tenderness right versus left.  Excellent rotator  cuff strength isolated infraspinatus supraspinatus and subscap muscle testing.  Motor sensory function to the arm is intact.  Does have positive impingement signs on the right negative on the left negative O'Brien's testing and speeds testing on the right-hand side.  No other masses lymph adenopathy or skin changes noted in the shoulder girdle region  Specialty Comments:  No specialty comments available.  Imaging: No results  found.   PMFS History: Patient Active Problem List   Diagnosis Date Noted  . Chronic insomnia 01/28/2011  . Seasonal and perennial allergic rhinitis 05/28/2010  . Lung nodule 05/28/2010  . HYPERLIPIDEMIA 11/23/2009  . G E R D 11/23/2009  . EUSTACHIAN TUBE DYSFUNCTION 11/18/2009  . HYPERTENSION 11/18/2009  . Unspecified hypothyroidism 03/14/2007  . Obstructive sleep apnea 03/14/2007  . FIBROMYALGIA 03/14/2007   Past Medical History:  Diagnosis Date  . DM (diabetes mellitus) ([place]) 2013   insulin  . Eustachian tube dysfunction   . GERD (gastroesophageal reflux disease)   . Hyperlipidemia   . Hypertension   . Hypothyroid   . Lung nodule    CT abd by [facility] 09/29/2009  . OSA (obstructive sleep apnea)   . Osteopenia     Family History  Problem Relation Age of Onset  . Heart attack Mother   . Colon cancer Father     Past Surgical History:  Procedure Laterality Date  . ANKLE SURGERY    . Anteriorvesicourethropexy    . BUNIONECTOMY    . COLONOSCOPY    . NASAL SINUS SURGERY    . PATELLA FRACTURE SURGERY  2009   Right  . TONSILLECTOMY    . TOTAL ABDOMINAL HYSTERECTOMY    . WRIST FRACTURE SURGERY  2009   Social History   Occupational History  . Disability    Social History Main Topics  . Smoking status: Never Smoker  . Smokeless tobacco: Never Used  . Alcohol use No  . Drug use: No  . Sexual activity: Not on file

## 2016-06-17 ENCOUNTER — Ambulatory Visit (HOSPITAL_COMMUNITY): Payer: Medicare Other

## 2016-06-17 DIAGNOSIS — G8929 Other chronic pain: Secondary | ICD-10-CM | POA: Diagnosis not present

## 2016-06-17 DIAGNOSIS — R252 Cramp and spasm: Secondary | ICD-10-CM | POA: Diagnosis not present

## 2016-06-17 DIAGNOSIS — M542 Cervicalgia: Secondary | ICD-10-CM | POA: Diagnosis not present

## 2016-06-17 DIAGNOSIS — M6281 Muscle weakness (generalized): Secondary | ICD-10-CM

## 2016-06-17 DIAGNOSIS — R293 Abnormal posture: Secondary | ICD-10-CM | POA: Diagnosis not present

## 2016-06-17 DIAGNOSIS — M545 Low back pain: Secondary | ICD-10-CM | POA: Diagnosis not present

## 2016-06-17 DIAGNOSIS — R29898 Other symptoms and signs involving the musculoskeletal system: Secondary | ICD-10-CM

## 2016-06-17 NOTE — Therapy (Signed)
[place], [place], [address] Phone: [phone]   Fax:  [phone]  Physical Therapy Treatment  Patient Details  Name: [name] MRN: [id number] Date of Birth: [birth year] Referring Provider: Newman Pies for neck/Melissa Unice Bailey for lumbar   Encounter Date: 06/17/2016      PT End of Session - 06/17/16 0957    Visit Number 3   Number of Visits 17   Date for PT Re-Evaluation 07/08/16   Authorization Type Medicare and [employer]    Authorization Time Period 06/10/16 to 08/10/16   Authorization - Visit Number 3   Authorization - Number of Visits 10   PT Start Time 0948   PT Stop Time 1028   PT Time Calculation (min) 40 min   Activity Tolerance Patient tolerated treatment well;No increased pain   Behavior During Therapy WFL for tasks assessed/performed      Past Medical History:  Diagnosis Date  . DM (diabetes mellitus) ([place]) 2013   insulin  . Eustachian tube dysfunction   . GERD (gastroesophageal reflux disease)   . Hyperlipidemia   . Hypertension   . Hypothyroid   . Lung nodule    CT abd by [facility] 09/29/2009  . OSA (obstructive sleep apnea)   . Osteopenia     Past Surgical History:  Procedure Laterality Date  . ANKLE SURGERY    . Anteriorvesicourethropexy    . BUNIONECTOMY    . COLONOSCOPY    . NASAL SINUS SURGERY    . PATELLA FRACTURE SURGERY  2009   Right  . TONSILLECTOMY    . TOTAL ABDOMINAL HYSTERECTOMY    . WRIST FRACTURE SURGERY  2009    There were no vitals filed for this visit.      Subjective Assessment - 06/17/16 0953    Subjective Pt reports she is doign well, no pain today, and her HEP is being performed without issue.    Currently in Pain? No/denies                         [facility] Adult PT Treatment/Exercise - 06/17/16 0001      Neck Exercises: Supine   Neck Retraction 10 reps;5 secs  into 3 pillows    Shoulder Flexion 10  reps  2x10 c RedTB   Shoulder Flexion Limitations scaption, 90-end-range  asked to avoid very end range pinching     Lumbar Exercises: Stretches   Active Hamstring Stretch Other (comment)  Supine, 10x5sec LAQ at 90* hip flexion, towel for kneesuppor   Piriformis Stretch 2 reps;30 seconds  BLE; knee to opposite shoulder, ankle supported     Lumbar Exercises: Supine   Clam 10 reps  2x10, greenTb adn pillow betwen feet, hips at 30* flexion    Clam Limitations Rt side notably weaker   Bridge Compliant  2x10 bilat, wide knees/feet, 115* @ knees for glute maxfocus     Manual Therapy   Manual Therapy Myofascial release;Passive ROM   Myofascial Release Right lateral neck: 3 minutes   Passive ROM supine cervical P/ROM stretching 3x30sec bilat   Right Pec Minor stretch 3x30sec, then by Rt shldr flexionx20                  PT Short Term Goals - 06/10/16 1004      PT SHORT TERM GOAL #1   Title Patient to demonstrate lumbar/thoracic/cervical ROM as being full and without pain in  order to improve mechanics and improve QOL    Time 4   Period Weeks   Status New     PT SHORT TERM GOAL #2   Title Patient to demonstrate improvement in bilateral hip IR/ER of at least 50% in order to improve mechanics and reduce pain    Time 4   Period Weeks   Status New     PT SHORT TERM GOAL #3   Title Patient to report cervical pain as being no more than 2/10 and lumbar pain being no more than 5/10 in order to improve QOL and functional task tolerance    Time 4   Period Weeks   Status New     PT SHORT TERM GOAL #4   Title Patient to be able to maintain correct posture at least 70% of the time without cuing as well as demonstrate correct functional mechancis for housekeeping tasks in order to reduce pain with functional activities    Time 4   Period Weeks   Status New     PT SHORT TERM GOAL #5   Title Patient to be independent in correctly and consistently performing targeted HEP, to be  updated as appropriate and tolerated    Time 4   Period Weeks   Status New           PT Long Term Goals - 06/10/16 1007      PT LONG TERM GOAL #1   Title Patient to demonstrate functional strength as being 5/5 in order to reduce pain and improve functional task tolerance    Time 8   Period Weeks   Status New     PT LONG TERM GOAL #2   Title Patient to demonstrate pain as being 2/10 in cervical and lumbar areas in order to improve QOL and functional task tolerance    Time 8   Period Weeks   Status New     PT LONG TERM GOAL #3   Title Patient to report she has been able to sleep through the night without waking due to pain in order to improve QOL    Time 8   Period Weeks   Status New     PT LONG TERM GOAL #4   Title Patient to report she has been able to go shopping and perform all functional tasks in her home without pain exacerbation in order to improve QOL    Time 8   Period Weeks   Status New               Plan - 06/17/16 1884    Clinical Impression Statement Overall patient responding well to treatment session, able to complete all exercises with minimal breaks required. Minimal verbal and tactile cues required for correct form, and patient learns quickly to self correct. Continued to focus on hip mobility and strengthening and postural correction. Pt noted to have tightness in rt pec minor, which after stretche dallowed for greater A/ROM of shoulder flexion and overhead reaching. No increase in pain this session. Overall pt making good progress toward goals.    Rehab Potential Fair   Clinical Impairments Affecting Rehab Potential (+) appears motivated to participate in PT, had success in managing pain with exercise in the past; (-) chronicity and extent of pain, fibromyalgia    PT Frequency 2x / week   PT Duration 8 weeks   PT Treatment/Interventions ADLs/Self Care Home Management;Biofeedback;Cryotherapy;Electrical Stimulation;Moist Heat;Ultrasound;Traction;DME  Instruction;Gait training;Stair training;Functional mobility training;Therapeutic activities;Therapeutic exercise;Balance training;Neuromuscular re-education;Patient/family education;Manual  techniques;Passive range of motion;Dry needling;Energy conservation;Taping   PT Next Visit Plan Continue with HS and hip rotation stretching; hip strengthening closed chain when possible.    PT Home Exercise Plan 3/27: bil supine HS stretch with strap, bil piriformis stretch, sleep with pillows under BLE when sleeping on back      Patient will benefit from skilled therapeutic intervention in order to improve the following deficits and impairments:  Abnormal gait, Increased fascial restricitons, Improper body mechanics, Pain, Decreased coordination, Decreased mobility, Increased muscle spasms, Postural dysfunction, Decreased activity tolerance, Decreased range of motion, Decreased strength, Hypomobility, Impaired UE functional use, Difficulty walking, Impaired flexibility  Visit Diagnosis: Cervicalgia  Abnormal posture  Chronic bilateral low back pain without sciatica  Muscle weakness (generalized)  Cramp and spasm  Other symptoms and signs involving the musculoskeletal system     Problem List Patient Active Problem List   Diagnosis Date Noted  . Chronic insomnia 01/28/2011  . Seasonal and perennial allergic rhinitis 05/28/2010  . Lung nodule 05/28/2010  . HYPERLIPIDEMIA 11/23/2009  . G E R D 11/23/2009  . EUSTACHIAN TUBE DYSFUNCTION 11/18/2009  . HYPERTENSION 11/18/2009  . Unspecified hypothyroidism 03/14/2007  . Obstructive sleep apnea 03/14/2007  . FIBROMYALGIA 03/14/2007    10:30 AM, 06/17/16 Etta Grandchild, PT, DPT Physical Therapist at [place] [phone] (office)      [place], [place], [address] Phone: [phone]   Fax:  [phone]  Name: [name] MRN:  [id number] Date of Birth: February 11, 1946

## 2016-06-22 ENCOUNTER — Ambulatory Visit (HOSPITAL_COMMUNITY): Payer: Medicare Other | Attending: Neurosurgery | Admitting: Physical Therapy

## 2016-06-22 DIAGNOSIS — G8929 Other chronic pain: Secondary | ICD-10-CM | POA: Insufficient documentation

## 2016-06-22 DIAGNOSIS — M542 Cervicalgia: Secondary | ICD-10-CM | POA: Insufficient documentation

## 2016-06-22 DIAGNOSIS — M6281 Muscle weakness (generalized): Secondary | ICD-10-CM

## 2016-06-22 DIAGNOSIS — R293 Abnormal posture: Secondary | ICD-10-CM | POA: Insufficient documentation

## 2016-06-22 DIAGNOSIS — R252 Cramp and spasm: Secondary | ICD-10-CM | POA: Diagnosis not present

## 2016-06-22 DIAGNOSIS — R29898 Other symptoms and signs involving the musculoskeletal system: Secondary | ICD-10-CM | POA: Diagnosis not present

## 2016-06-22 DIAGNOSIS — M545 Low back pain: Secondary | ICD-10-CM | POA: Insufficient documentation

## 2016-06-22 NOTE — Therapy (Signed)
[place], [place], [address] Phone: [phone]   Fax:  [phone]  Physical Therapy Treatment  Patient Details  Name: [name] MRN: [id number] Date of Birth: [birth year] Referring Provider: Newman Pies for neck/Melissa Unice Bailey for lumbar   Encounter Date: 06/22/2016      PT End of Session - 06/22/16 0901    Visit Number 4   Number of Visits 17   Date for PT Re-Evaluation 07/08/16   Authorization Type Medicare and [employer]    Authorization Time Period 06/10/16 to 08/10/16   Authorization - Visit Number 4   Authorization - Number of Visits 10   PT Start Time 0815   PT Stop Time 0856   PT Time Calculation (min) 41 min   Activity Tolerance Patient tolerated treatment well   Behavior During Therapy [facility] for tasks assessed/performed      Past Medical History:  Diagnosis Date  . DM (diabetes mellitus) ([place]) 2013   insulin  . Eustachian tube dysfunction   . GERD (gastroesophageal reflux disease)   . Hyperlipidemia   . Hypertension   . Hypothyroid   . Lung nodule    CT abd by [facility] 09/29/2009  . OSA (obstructive sleep apnea)   . Osteopenia     Past Surgical History:  Procedure Laterality Date  . ANKLE SURGERY    . Anteriorvesicourethropexy    . BUNIONECTOMY    . COLONOSCOPY    . NASAL SINUS SURGERY    . PATELLA FRACTURE SURGERY  2009   Right  . TONSILLECTOMY    . TOTAL ABDOMINAL HYSTERECTOMY    . WRIST FRACTURE SURGERY  2009    There were no vitals filed for this visit.      Subjective Assessment - 06/22/16 0816    Subjective Patient arrives today stating she is doing well, her pain in general is maybe 2-3 today. No falls or close calls recently.    Patient Stated Goals reduce pain, be able to walk longer distances to go shopping, be able to walk more when she first gets up   Currently in Pain? Yes   Pain Score 3    Pain Location Other (Comment)   neck/back/shoulder    Pain Orientation Right;Left   Pain Descriptors / Indicators Aching   Pain Type Chronic pain   Pain Radiating Towards sometimes radiating pain with extended walking down LE    Pain Onset More than a month ago   Pain Frequency Intermittent   Aggravating Factors  doing too much, walking, getting up and down    Pain Relieving Factors heat    Effect of Pain on Daily Activities severe impact                          [facility] Adult PT Treatment/Exercise - 06/22/16 0001      Neck Exercises: Seated   Neck Retraction 15 reps;3 secs   Shoulder Rolls 15 reps;Backwards   Shoulder Rolls Limitations "up, back, down"    Other Seated Exercise 3D cervical and thoracic excursions 1x10      Neck Exercises: Supine   Other Supine Exercise starfish stretch x3 minutes      Lumbar Exercises: Stretches   Active Hamstring Stretch 3 reps;30 seconds   Active Hamstring Stretch Limitations supine with sheet    Piriformis Stretch 3 reps;30 seconds   Piriformis Stretch Limitations supine      Lumbar  Exercises: Standing   Functional Squats 10 reps   Functional Squats Limitations off standard height chair, cues for form   Forward Lunge --   Forward Lunge Limitations --   Other Standing Lumbar Exercises 3D hip excursions 1x10; hip IR stretch off box 1x5 each    Other Standing Lumbar Exercises forward step ups and lateral step ups x10 each, 4 inch step                 PT Education - 06/22/16 0901    Education provided Yes   Education Details possible muscle soreness due to new activities today, importance of attenuating CNS pain interpretation in chronic pain    Person(s) Educated Patient   Methods Explanation   Comprehension Verbalized understanding          PT Short Term Goals - 06/10/16 1004      PT SHORT TERM GOAL #1   Title Patient to demonstrate lumbar/thoracic/cervical ROM as being full and without pain in order to improve mechanics and improve QOL     Time 4   Period Weeks   Status New     PT SHORT TERM GOAL #2   Title Patient to demonstrate improvement in bilateral hip IR/ER of at least 50% in order to improve mechanics and reduce pain    Time 4   Period Weeks   Status New     PT SHORT TERM GOAL #3   Title Patient to report cervical pain as being no more than 2/10 and lumbar pain being no more than 5/10 in order to improve QOL and functional task tolerance    Time 4   Period Weeks   Status New     PT SHORT TERM GOAL #4   Title Patient to be able to maintain correct posture at least 70% of the time without cuing as well as demonstrate correct functional mechancis for housekeeping tasks in order to reduce pain with functional activities    Time 4   Period Weeks   Status New     PT SHORT TERM GOAL #5   Title Patient to be independent in correctly and consistently performing targeted HEP, to be updated as appropriate and tolerated    Time 4   Period Weeks   Status New           PT Long Term Goals - 06/10/16 1007      PT LONG TERM GOAL #1   Title Patient to demonstrate functional strength as being 5/5 in order to reduce pain and improve functional task tolerance    Time 8   Period Weeks   Status New     PT LONG TERM GOAL #2   Title Patient to demonstrate pain as being 2/10 in cervical and lumbar areas in order to improve QOL and functional task tolerance    Time 8   Period Weeks   Status New     PT LONG TERM GOAL #3   Title Patient to report she has been able to sleep through the night without waking due to pain in order to improve QOL    Time 8   Period Weeks   Status New     PT LONG TERM GOAL #4   Title Patient to report she has been able to go shopping and perform all functional tasks in her home without pain exacerbation in order to improve QOL    Time 8   Period Weeks   Status New  Plan - 06/22/16 0902    Clinical Impression Statement Patient arrives in good spirits today  reporting that she feels like she may be starting to get better. Continued with functional LE/hip stretching today as well as starfish stretch and cervico-thoracic excursions for whole body focus and postural improvement. Otherwise continued with functional strengthening, including exercise for hip IR ROM, for postural muscles and hips today; introduced closed chain hip strengthening this session as well with apparent good tolerance by patient.    Rehab Potential Fair   Clinical Impairments Affecting Rehab Potential (+) appears motivated to participate in PT, had success in managing pain with exercise in the past; (-) chronicity and extent of pain, fibromyalgia    PT Frequency 2x / week   PT Duration 8 weeks   PT Treatment/Interventions ADLs/Self Care Home Management;Biofeedback;Cryotherapy;Electrical Stimulation;Moist Heat;Ultrasound;Traction;DME Instruction;Gait training;Stair training;Functional mobility training;Therapeutic activities;Therapeutic exercise;Balance training;Neuromuscular re-education;Patient/family education;Manual techniques;Passive range of motion;Dry needling;Energy conservation;Taping   PT Next Visit Plan continue with LE stretching including standing hip IR stretches on box; continue functional strength in standing and postural training    PT Home Exercise Plan 3/27: bil supine HS stretch with strap, bil piriformis stretch, sleep with pillows under BLE when sleeping on back   Consulted and Agree with Plan of Care Patient      Patient will benefit from skilled therapeutic intervention in order to improve the following deficits and impairments:  Abnormal gait, Increased fascial restricitons, Improper body mechanics, Pain, Decreased coordination, Decreased mobility, Increased muscle spasms, Postural dysfunction, Decreased activity tolerance, Decreased range of motion, Decreased strength, Hypomobility, Impaired UE functional use, Difficulty walking, Impaired flexibility  Visit  Diagnosis: Cervicalgia  Abnormal posture  Chronic bilateral low back pain without sciatica  Muscle weakness (generalized)  Cramp and spasm  Other symptoms and signs involving the musculoskeletal system     Problem List Patient Active Problem List   Diagnosis Date Noted  . Chronic insomnia 01/28/2011  . Seasonal and perennial allergic rhinitis 05/28/2010  . Lung nodule 05/28/2010  . HYPERLIPIDEMIA 11/23/2009  . G E R D 11/23/2009  . EUSTACHIAN TUBE DYSFUNCTION 11/18/2009  . HYPERTENSION 11/18/2009  . Unspecified hypothyroidism 03/14/2007  . Obstructive sleep apnea 03/14/2007  . FIBROMYALGIA 03/14/2007    Deniece Ree PT, DPT [facility] [address] [place], [place], [address] Phone: [phone]   Fax:  [phone]  Name: [name] MRN: [id number] Date of Birth: 09-08-1945

## 2016-06-24 ENCOUNTER — Ambulatory Visit (HOSPITAL_COMMUNITY): Payer: Medicare Other | Admitting: Physical Therapy

## 2016-06-24 DIAGNOSIS — M6281 Muscle weakness (generalized): Secondary | ICD-10-CM | POA: Diagnosis not present

## 2016-06-24 DIAGNOSIS — M542 Cervicalgia: Secondary | ICD-10-CM

## 2016-06-24 DIAGNOSIS — M545 Low back pain, unspecified: Secondary | ICD-10-CM

## 2016-06-24 DIAGNOSIS — R252 Cramp and spasm: Secondary | ICD-10-CM | POA: Diagnosis not present

## 2016-06-24 DIAGNOSIS — R293 Abnormal posture: Secondary | ICD-10-CM | POA: Diagnosis not present

## 2016-06-24 DIAGNOSIS — R29898 Other symptoms and signs involving the musculoskeletal system: Secondary | ICD-10-CM

## 2016-06-24 DIAGNOSIS — G8929 Other chronic pain: Secondary | ICD-10-CM | POA: Diagnosis not present

## 2016-06-24 NOTE — Patient Instructions (Signed)
   Retraction With Rotation  Perform chin tuck and rotate your head while maintaining your tuck.  Do not bend forward or lose tuck. Go as far as you can comfortably.  Repeat 10 times, 1-2 times per day.      SHOULDER ROLLS  Move your shoulders in a circular pattern as shown so that your are moving in an up, back and down direction.  Repeat 15 times, 1-2 times per day.

## 2016-06-24 NOTE — Therapy (Signed)
[place], [place], [address] Phone: [phone]   Fax:  [phone]  Physical Therapy Treatment  Patient Details  Name: [name] MRN: [id number] Date of Birth: August 26, 1945 Referring Provider: Newman Pies for neck/Melissa Unice Bailey for lumbar   Encounter Date: 06/24/2016      PT End of Session - 06/24/16 0856    Visit Number 5   Number of Visits 17   Date for PT Re-Evaluation 07/08/16   Authorization Type Medicare and [employer]    Authorization Time Period 06/10/16 to 08/10/16   Authorization - Visit Number 5   Authorization - Number of Visits 10   PT Start Time 0815   PT Stop Time 5400   PT Time Calculation (min) 40 min   Activity Tolerance Patient tolerated treatment well   Behavior During Therapy [facility] for tasks assessed/performed      Past Medical History:  Diagnosis Date  . DM (diabetes mellitus) ([place]) 2013   insulin  . Eustachian tube dysfunction   . GERD (gastroesophageal reflux disease)   . Hyperlipidemia   . Hypertension   . Hypothyroid   . Lung nodule    CT abd by [facility] 09/29/2009  . OSA (obstructive sleep apnea)   . Osteopenia     Past Surgical History:  Procedure Laterality Date  . ANKLE SURGERY    . Anteriorvesicourethropexy    . BUNIONECTOMY    . COLONOSCOPY    . NASAL SINUS SURGERY    . PATELLA FRACTURE SURGERY  2009   Right  . TONSILLECTOMY    . TOTAL ABDOMINAL HYSTERECTOMY    . WRIST FRACTURE SURGERY  2009    There were no vitals filed for this visit.      Subjective Assessment - 06/24/16 8676    Subjective Patient arrives today stating she is doing well, pain is 0/10 and she has no complaints today    Pertinent History HTN, hypothyroidism, DM, fibromyalgia, ankle surgery, R knee surgery    Patient Stated Goals reduce pain, be able to walk longer distances to go shopping, be able to walk more when she first gets up   Currently in Pain?  No/denies                         [facility] Adult PT Treatment/Exercise - 06/24/16 0001      Neck Exercises: Seated   Neck Retraction 10 reps   Neck Retraction Limitations chin tuck with rotation B    Shoulder Rolls 15 reps;Backwards   Other Seated Exercise scapular retraction 1x15   Other Seated Exercise 3D cervical/thoracic excursions 1x10      Lumbar Exercises: Stretches   Active Hamstring Stretch 3 reps;30 seconds   Active Hamstring Stretch Limitations supine with sheet    Piriformis Stretch 3 reps;30 seconds   Piriformis Stretch Limitations supine      Lumbar Exercises: Standing   Other Standing Lumbar Exercises 3D hip excursions 1x10; hip IR stretch off box 1x5 each      Lumbar Exercises: Supine   Bridge 15 reps     Lumbar Exercises: Sidelying   Clam 15 reps  red TB    Hip Abduction 15 reps;10 reps                PT Education - 06/24/16 0855    Education provided Yes   Education Details HEP updates, general progress with skill   Person(s)  Educated Patient   Methods Explanation;Handout   Comprehension Verbalized understanding;Returned demonstration          PT Short Term Goals - 06/10/16 1004      PT SHORT TERM GOAL #1   Title Patient to demonstrate lumbar/thoracic/cervical ROM as being full and without pain in order to improve mechanics and improve QOL    Time 4   Period Weeks   Status New     PT SHORT TERM GOAL #2   Title Patient to demonstrate improvement in bilateral hip IR/ER of at least 50% in order to improve mechanics and reduce pain    Time 4   Period Weeks   Status New     PT SHORT TERM GOAL #3   Title Patient to report cervical pain as being no more than 2/10 and lumbar pain being no more than 5/10 in order to improve QOL and functional task tolerance    Time 4   Period Weeks   Status New     PT SHORT TERM GOAL #4   Title Patient to be able to maintain correct posture at least 70% of the time without cuing as well as  demonstrate correct functional mechancis for housekeeping tasks in order to reduce pain with functional activities    Time 4   Period Weeks   Status New     PT SHORT TERM GOAL #5   Title Patient to be independent in correctly and consistently performing targeted HEP, to be updated as appropriate and tolerated    Time 4   Period Weeks   Status New           PT Long Term Goals - 06/10/16 1007      PT LONG TERM GOAL #1   Title Patient to demonstrate functional strength as being 5/5 in order to reduce pain and improve functional task tolerance    Time 8   Period Weeks   Status New     PT LONG TERM GOAL #2   Title Patient to demonstrate pain as being 2/10 in cervical and lumbar areas in order to improve QOL and functional task tolerance    Time 8   Period Weeks   Status New     PT LONG TERM GOAL #3   Title Patient to report she has been able to sleep through the night without waking due to pain in order to improve QOL    Time 8   Period Weeks   Status New     PT LONG TERM GOAL #4   Title Patient to report she has been able to go shopping and perform all functional tasks in her home without pain exacerbation in order to improve QOL    Time 8   Period Weeks   Status New               Plan - 06/24/16 0857    Clinical Impression Statement Patient arrives with pain being 0/10 today, reports she really feels quite well. Continued with functional stretching and hip mobility as well as postural training/strengthening and CKC exercise for general strength and endurance today. Will plan to continue to progress patient as tolerated given that she has been tolerating PT sessions very well thus far. HEP updates performed today as well.    Rehab Potential Fair   Clinical Impairments Affecting Rehab Potential (+) appears motivated to participate in PT, had success in managing pain with exercise in the past; (-) chronicity and extent of pain,  fibromyalgia    PT Frequency 2x / week    PT Duration 8 weeks   PT Treatment/Interventions ADLs/Self Care Home Management;Biofeedback;Cryotherapy;Electrical Stimulation;Moist Heat;Ultrasound;Traction;DME Instruction;Gait training;Stair training;Functional mobility training;Therapeutic activities;Therapeutic exercise;Balance training;Neuromuscular re-education;Patient/family education;Manual techniques;Passive range of motion;Dry needling;Energy conservation;Taping   PT Next Visit Plan continue progressing cervical/posture strength and CKC exercise, hip mobility    PT Home Exercise Plan 3/27: bil supine HS stretch with strap, bil piriformis stretch, sleep with pillows under BLE when sleeping on back 4/4 chin tucks with rotation, shoulder rolls, 3D hip excursions    Consulted and Agree with Plan of Care Patient      Patient will benefit from skilled therapeutic intervention in order to improve the following deficits and impairments:  Abnormal gait, Increased fascial restricitons, Improper body mechanics, Pain, Decreased coordination, Decreased mobility, Increased muscle spasms, Postural dysfunction, Decreased activity tolerance, Decreased range of motion, Decreased strength, Hypomobility, Impaired UE functional use, Difficulty walking, Impaired flexibility  Visit Diagnosis: Cervicalgia  Abnormal posture  Chronic bilateral low back pain without sciatica  Muscle weakness (generalized)  Cramp and spasm  Other symptoms and signs involving the musculoskeletal system     Problem List Patient Active Problem List   Diagnosis Date Noted  . Chronic insomnia 01/28/2011  . Seasonal and perennial allergic rhinitis 05/28/2010  . Lung nodule 05/28/2010  . HYPERLIPIDEMIA 11/23/2009  . G E R D 11/23/2009  . EUSTACHIAN TUBE DYSFUNCTION 11/18/2009  . HYPERTENSION 11/18/2009  . Unspecified hypothyroidism 03/14/2007  . Obstructive sleep apnea 03/14/2007  . FIBROMYALGIA 03/14/2007    Deniece Ree PT, DPT [facility] [address] [place], [place], [address] Phone: [phone]   Fax:  [phone]  Name: [name] MRN: [id number] Date of Birth: 1946/02/05

## 2016-06-29 ENCOUNTER — Ambulatory Visit (HOSPITAL_COMMUNITY): Payer: Medicare Other

## 2016-06-29 ENCOUNTER — Encounter (HOSPITAL_COMMUNITY): Payer: Self-pay

## 2016-06-29 DIAGNOSIS — G8929 Other chronic pain: Secondary | ICD-10-CM

## 2016-06-29 DIAGNOSIS — M542 Cervicalgia: Secondary | ICD-10-CM | POA: Diagnosis not present

## 2016-06-29 DIAGNOSIS — M6281 Muscle weakness (generalized): Secondary | ICD-10-CM

## 2016-06-29 DIAGNOSIS — M545 Low back pain, unspecified: Secondary | ICD-10-CM

## 2016-06-29 DIAGNOSIS — R252 Cramp and spasm: Secondary | ICD-10-CM | POA: Diagnosis not present

## 2016-06-29 DIAGNOSIS — R29898 Other symptoms and signs involving the musculoskeletal system: Secondary | ICD-10-CM

## 2016-06-29 DIAGNOSIS — R293 Abnormal posture: Secondary | ICD-10-CM

## 2016-06-29 NOTE — Therapy (Signed)
[place], [place], [address] Phone: [phone]   Fax:  [phone]  Physical Therapy Treatment  Patient Details  Name: [name] MRN: [id number] Date of Birth: 04-28-1945 Referring Provider: Newman Pies for neck/Melissa Unice Bailey for lumbar   Encounter Date: 06/29/2016      PT End of Session - 06/29/16 1351    Visit Number 6   Number of Visits 17   Date for PT Re-Evaluation 07/08/16   Authorization Type Medicare and [employer]    Authorization Time Period 06/10/16 to 08/10/16   Authorization - Visit Number 6   Authorization - Number of Visits 10   PT Start Time 2947   PT Stop Time 1428   PT Time Calculation (min) 39 min   Activity Tolerance Patient tolerated treatment well   Behavior During Therapy [facility] for tasks assessed/performed      Past Medical History:  Diagnosis Date  . DM (diabetes mellitus) ([place]) 2013   insulin  . Eustachian tube dysfunction   . GERD (gastroesophageal reflux disease)   . Hyperlipidemia   . Hypertension   . Hypothyroid   . Lung nodule    CT abd by [facility] 09/29/2009  . OSA (obstructive sleep apnea)   . Osteopenia     Past Surgical History:  Procedure Laterality Date  . ANKLE SURGERY    . Anteriorvesicourethropexy    . BUNIONECTOMY    . COLONOSCOPY    . NASAL SINUS SURGERY    . PATELLA FRACTURE SURGERY  2009   Right  . TONSILLECTOMY    . TOTAL ABDOMINAL HYSTERECTOMY    . WRIST FRACTURE SURGERY  2009    There were no vitals filed for this visit.      Subjective Assessment - 06/29/16 1350    Subjective Pt states that she is doing pretty good today. She denies any pain, but states that she was sore following last treatment session.   Pertinent History HTN, hypothyroidism, DM, fibromyalgia, ankle surgery, R knee surgery    Patient Stated Goals reduce pain, be able to walk longer distances to go shopping, be able to walk more when she  first gets up   Currently in Pain? No/denies           [facility] Adult PT Treatment/Exercise - 06/29/16 0001      Neck Exercises: Standing   Other Standing Exercises scap retraction and pulldowns with RTB 2x10 while maintaining chin tuck   Other Standing Exercises Y's on wall with lift off 2x10 with RTB     Neck Exercises: Seated   Neck Retraction 15 reps   Neck Retraction Limitations with bil rotation   Other Seated Exercise 3D thoracic excursion x 15 reps each way     Lumbar Exercises: Stretches   Active Hamstring Stretch 3 reps;30 seconds   Active Hamstring Stretch Limitations BLE, supine with sheet      Lumbar Exercises: Standing   Other Standing Lumbar Exercises bil side stepping with GTB, 36ft x 3 laps; bil SLS on firm and bil pulldowns with RTB x 10 each   Other Standing Lumbar Exercises bil SLS on foam 5 x 10 sec holds each with intermittent UE support; bil SLS on foam and palov press with RTB x 10 each                PT Education - 06/29/16 1429    Education provided Yes   Education Details exercise technique,  will reassess next session since she returns to her doctor on 4/19   Person(s) Educated Patient   Methods Explanation;Demonstration   Comprehension Verbalized understanding;Returned demonstration          PT Short Term Goals - 06/10/16 1004      PT SHORT TERM GOAL #1   Title Patient to demonstrate lumbar/thoracic/cervical ROM as being full and without pain in order to improve mechanics and improve QOL    Time 4   Period Weeks   Status New     PT SHORT TERM GOAL #2   Title Patient to demonstrate improvement in bilateral hip IR/ER of at least 50% in order to improve mechanics and reduce pain    Time 4   Period Weeks   Status New     PT SHORT TERM GOAL #3   Title Patient to report cervical pain as being no more than 2/10 and lumbar pain being no more than 5/10 in order to improve QOL and functional task tolerance    Time 4   Period Weeks    Status New     PT SHORT TERM GOAL #4   Title Patient to be able to maintain correct posture at least 70% of the time without cuing as well as demonstrate correct functional mechancis for housekeeping tasks in order to reduce pain with functional activities    Time 4   Period Weeks   Status New     PT SHORT TERM GOAL #5   Title Patient to be independent in correctly and consistently performing targeted HEP, to be updated as appropriate and tolerated    Time 4   Period Weeks   Status New           PT Long Term Goals - 06/10/16 1007      PT LONG TERM GOAL #1   Title Patient to demonstrate functional strength as being 5/5 in order to reduce pain and improve functional task tolerance    Time 8   Period Weeks   Status New     PT LONG TERM GOAL #2   Title Patient to demonstrate pain as being 2/10 in cervical and lumbar areas in order to improve QOL and functional task tolerance    Time 8   Period Weeks   Status New     PT LONG TERM GOAL #3   Title Patient to report she has been able to sleep through the night without waking due to pain in order to improve QOL    Time 8   Period Weeks   Status New     PT LONG TERM GOAL #4   Title Patient to report she has been able to go shopping and perform all functional tasks in her home without pain exacerbation in order to improve QOL    Time 8   Period Weeks   Status New               Plan - 06/29/16 1430    Clinical Impression Statement Pt continues with no pain and tolerated progressed therex well this date. She demo'd fatigue with new postural strengthening exercises but she did not have increased pain. Pt returns to her doctor on 4/19 so she will be reassessed on her next visit 4/17 to determine future PT needs.    Rehab Potential Fair   Clinical Impairments Affecting Rehab Potential (+) appears motivated to participate in PT, had success in managing pain with exercise in the past; (-) chronicity and  extent of pain,  fibromyalgia    PT Frequency 2x / week   PT Duration 8 weeks   PT Treatment/Interventions ADLs/Self Care Home Management;Biofeedback;Cryotherapy;Electrical Stimulation;Moist Heat;Ultrasound;Traction;DME Instruction;Gait training;Stair training;Functional mobility training;Therapeutic activities;Therapeutic exercise;Balance training;Neuromuscular re-education;Patient/family education;Manual techniques;Passive range of motion;Dry needling;Energy conservation;Taping   PT Next Visit Plan Reassess on 4/17 as she is going back to her doctor on 4/19. continue progressing cervical/posture strength and CKC exercise, hip mobility    PT Home Exercise Plan 3/27: bil supine HS stretch with strap, bil piriformis stretch, sleep with pillows under BLE when sleeping on back 4/4 chin tucks with rotation, shoulder rolls, 3D hip excursions    Consulted and Agree with Plan of Care Patient      Patient will benefit from skilled therapeutic intervention in order to improve the following deficits and impairments:  Abnormal gait, Increased fascial restricitons, Improper body mechanics, Pain, Decreased coordination, Decreased mobility, Increased muscle spasms, Postural dysfunction, Decreased activity tolerance, Decreased range of motion, Decreased strength, Hypomobility, Impaired UE functional use, Difficulty walking, Impaired flexibility  Visit Diagnosis: Cervicalgia  Abnormal posture  Chronic bilateral low back pain without sciatica  Muscle weakness (generalized)  Cramp and spasm  Other symptoms and signs involving the musculoskeletal system     Problem List Patient Active Problem List   Diagnosis Date Noted  . Chronic insomnia 01/28/2011  . Seasonal and perennial allergic rhinitis 05/28/2010  . Lung nodule 05/28/2010  . HYPERLIPIDEMIA 11/23/2009  . G E R D 11/23/2009  . EUSTACHIAN TUBE DYSFUNCTION 11/18/2009  . HYPERTENSION 11/18/2009  . Unspecified hypothyroidism 03/14/2007  . Obstructive sleep  apnea 03/14/2007  . FIBROMYALGIA 03/14/2007     Geraldine Solar PT, DPT   [facility] [address] [place], [place], [address] Phone: [phone]   Fax:  [phone]  Name: [name] MRN: [id number] Date of Birth: 1945-09-23

## 2016-07-01 ENCOUNTER — Encounter (HOSPITAL_COMMUNITY): Payer: Medicare Other | Admitting: Physical Therapy

## 2016-07-06 ENCOUNTER — Ambulatory Visit (HOSPITAL_COMMUNITY): Payer: Medicare Other | Admitting: Physical Therapy

## 2016-07-06 DIAGNOSIS — R252 Cramp and spasm: Secondary | ICD-10-CM | POA: Diagnosis not present

## 2016-07-06 DIAGNOSIS — M545 Low back pain: Secondary | ICD-10-CM

## 2016-07-06 DIAGNOSIS — R293 Abnormal posture: Secondary | ICD-10-CM | POA: Diagnosis not present

## 2016-07-06 DIAGNOSIS — G8929 Other chronic pain: Secondary | ICD-10-CM | POA: Diagnosis not present

## 2016-07-06 DIAGNOSIS — M542 Cervicalgia: Secondary | ICD-10-CM | POA: Diagnosis not present

## 2016-07-06 DIAGNOSIS — M6281 Muscle weakness (generalized): Secondary | ICD-10-CM

## 2016-07-06 DIAGNOSIS — Z6836 Body mass index (BMI) 36.0-36.9, adult: Secondary | ICD-10-CM | POA: Diagnosis not present

## 2016-07-06 DIAGNOSIS — S8991XA Unspecified injury of right lower leg, initial encounter: Secondary | ICD-10-CM | POA: Diagnosis not present

## 2016-07-06 DIAGNOSIS — R29898 Other symptoms and signs involving the musculoskeletal system: Secondary | ICD-10-CM

## 2016-07-06 NOTE — Patient Instructions (Signed)
   SHOULDER ROLLS  Move your shoulders in a circular pattern as shown so that your are moving in an up, back and down direction. Perform small cicles if needed for comfort.  Repeat 15 times, twice a day.     HAMSTRING STRETCH WITH TOWEL  While lying down on your back, hook a towel or strap under  your foot and draw up your leg until a stretch is felt under your leg. calf area.  Keep both legs in a straightened position during the stretch.  Hold for 30 seconds and switch legs. Repeat 3 times each side, twice a day.     PIRIFORMIS AND HIP STRETCH - SEATED (DO NOT HUNCH LIKE THE FELLOW IN THE PICTURE)  While sitting in a chair, cross your affected leg on top of the other as shown.   Next, gently lean forward until a stretch is felt along the crossed leg.  Hold for 30 seconds and relax. Repeat 3 times each side, twice a day.

## 2016-07-06 NOTE — Therapy (Signed)
[place], [place], [address] Phone: [phone]   Fax:  [phone]  Physical Therapy Treatment (Discharge)  Patient Details  Name: [name] MRN: [id number] Date of Birth: 1945-12-15 Referring Provider: Newman Pies for neck/Melissa Unice Bailey for lumbar   Encounter Date: 07/06/2016      PT End of Session - 07/06/16 0905    Visit Number 7   Number of Visits 7   Date for PT Re-Evaluation 07/08/16   Authorization Type Medicare and [employer]    Authorization Time Period 06/10/16 to 08/10/16   Authorization - Visit Number 7   Authorization - Number of Visits 10   PT Start Time 0815   PT Stop Time 0856   PT Time Calculation (min) 41 min   Activity Tolerance Patient tolerated treatment well   Behavior During Therapy [facility] for tasks assessed/performed      Past Medical History:  Diagnosis Date  . DM (diabetes mellitus) ([place]) 2013   insulin  . Eustachian tube dysfunction   . GERD (gastroesophageal reflux disease)   . Hyperlipidemia   . Hypertension   . Hypothyroid   . Lung nodule    CT abd by [facility] 09/29/2009  . OSA (obstructive sleep apnea)   . Osteopenia     Past Surgical History:  Procedure Laterality Date  . ANKLE SURGERY    . Anteriorvesicourethropexy    . BUNIONECTOMY    . COLONOSCOPY    . NASAL SINUS SURGERY    . PATELLA FRACTURE SURGERY  2009   Right  . TONSILLECTOMY    . TOTAL ABDOMINAL HYSTERECTOMY    . WRIST FRACTURE SURGERY  2009    There were no vitals filed for this visit.      Subjective Assessment - 07/06/16 0819    Subjective Patient states she is doing pretty well, she had a long ride to [facility] and it went OK it was just hard for her to walk on the beach. She states that she still has some pain but it is not like she did. She rates herself as being 8/10 right now with her remaining concerns being her shoulder and hips. Her neck and low back,  which is what her MD orders were for, are much better and her remaining concern is her shoulder and hips.    Pertinent History HTN, hypothyroidism, DM, fibromyalgia, ankle surgery, R knee surgery    How long can you sit comfortably? 4/17- fairly unlimited    How long can you stand comfortably? 4/17- 2-4 hours    How long can you walk comfortably? 4/17- 1/2 mile    Patient Stated Goals reduce pain, be able to walk longer distances to go shopping, be able to walk more when she first gets up   Currently in Pain? Yes   Pain Score 3    Pain Location Other (Comment)  R shoulder and hip    Pain Orientation Right   Pain Descriptors / Indicators Other (Comment)  R shoulder and hip throbbing/pulsating pain   Pain Type Chronic pain   Pain Radiating Towards none    Pain Onset More than a month ago   Pain Frequency Constant   Aggravating Factors  walking on the beach for hip, not sure for shoulder other than UE use/vacuuming    Pain Relieving Factors shoulder nothing, hip rest    Effect of Pain on Daily Activities moderate impact, has to go her own pace  [facility] PT Assessment - 07/06/16 0001      AROM   Right Shoulder Flexion --  WFL    Right Shoulder ABduction --  [facility]    Right Shoulder Internal Rotation --  T7   Right Shoulder External Rotation --  T1   Left Shoulder Flexion --  [facility]    Left Shoulder ABduction --  [facility]    Left Shoulder Internal Rotation --  T8   Left Shoulder External Rotation --  T3   Cervical Flexion 55   Cervical Extension 40   Cervical - Right Side Bend 24   Cervical - Left Side Bend 30   Cervical - Right Rotation 70   Cervical - Left Rotation 70   Lumbar Flexion approx 2-3 inches above floor    Lumbar Extension WFL    Lumbar - Right Side Bend min limitation    Lumbar - Left Side Bend min limitation    Thoracic Flexion [facility]    Thoracic Extension [facility]    Thoracic - Right Side Bend WFL    Thoracic - Left Side Bend min limitation    Thoracic -  Right Rotation [facility]    Thoracic - Left Rotation WFL      Strength   Right Hip Flexion 4+/5   Right Hip Extension 4+/5   Right Hip ABduction 4+/5   Left Hip Flexion 4+/5   Left Hip Extension 4+/5   Left Hip ABduction 5/5   Right Knee Flexion 5/5   Right Knee Extension 5/5   Left Knee Flexion 5/5   Left Knee Extension 5/5   Right Ankle Dorsiflexion 5/5   Left Ankle Dorsiflexion 5/5     Flexibility   Hamstrings WFL    Piriformis mod limitation L, WFL R                              PT Education - 07/06/16 0904    Education provided Yes   Education Details progress with skilled PT services; hip and shoulder are different problems than initial MD referrals are for and she will need new MD orders for [place] to address these; final HEP review, [employer]/silver sneakers, senior center; strong encouragement of regular physical activity to maintain current status/possible benefits for hip pain    Person(s) Educated Patient   Methods Explanation;Demonstration;Handout   Comprehension Verbalized understanding          PT Short Term Goals - 07/06/16 0843      PT SHORT TERM GOAL #1   Title Patient to demonstrate lumbar/thoracic/cervical ROM as being full and without pain in order to improve mechanics and improve QOL    Baseline 4/17- much improved, mild limtiations lumbar area mostly    Time 4   Period Weeks   Status Achieved     PT SHORT TERM GOAL #2   Title Patient to demonstrate improvement in bilateral hip IR/ER of at least 50% in order to improve mechanics and reduce pain    Baseline 4/17- improved but not enough to meet goal    Time 4   Period Weeks   Status Partially Met     PT SHORT TERM GOAL #3   Title Patient to report cervical pain as being no more than 2/10 and lumbar pain being no more than 5/10 in order to improve QOL and functional task tolerance    Baseline 4/17- met for both areas pain is more shoulder and hip  Time 4   Period Weeks   Status  Achieved     PT SHORT TERM GOAL #4   Title Patient to be able to maintain correct posture at least 70% of the time without cuing as well as demonstrate correct functional mechancis for housekeeping tasks in order to reduce pain with functional activities    Baseline 4/17- improving    Time 4   Period Weeks   Status Partially Met     PT SHORT TERM GOAL #5   Title Patient to be independent in correctly and consistently performing targeted HEP, to be updated as appropriate and tolerated    Baseline 4/17- compliant, x2 a day    Time 4   Period Weeks   Status Achieved           PT Long Term Goals - 07/06/16 0846      PT LONG TERM GOAL #1   Title Patient to demonstrate functional strength as being 5/5 in order to reduce pain and improve functional task tolerance    Time 8   Period Weeks   Status Partially Met     PT LONG TERM GOAL #2   Title Patient to demonstrate pain as being 2/10 in cervical and lumbar areas in order to improve QOL and functional task tolerance    Time 8   Period Weeks   Status Achieved     PT LONG TERM GOAL #3   Title Patient to report she has been able to sleep through the night without waking due to pain in order to improve QOL    Baseline 4/17- has not been waking due to pain    Time 8   Period Weeks   Status Achieved     PT LONG TERM GOAL #4   Title Patient to report she has been able to go shopping and perform all functional tasks in her home without pain exacerbation in order to improve QOL    Baseline 4/17- as long as she paces herself she is doing well with this    Time 8   Period Weeks   Status Achieved               Plan - 07/06/16 0906    Clinical Impression Statement Re-assessment performed today. Patient reports she has improved quite a bit in terms of her neck and low back, which is what referrals were for, and at this point her R shoulder and hip are her main concern. Examination reveals great improvement in all objective  measures at this time, and patient has met or partially met the majority of her short and long term goals. At this point recommend DC from skilled PT care for cervical and lumbar areas however encouraged patient to speak to MD about returning for PT/OT for R hip and shoulder. DC to home program at this time.    Rehab Potential Fair   Clinical Impairments Affecting Rehab Potential (+) appears motivated to participate in PT, had success in managing pain with exercise in the past; (-) chronicity and extent of pain, fibromyalgia    PT Next Visit Plan DC today, recommend return for PT/OT to R hip and shoulder if MD agreeable to this plan    Consulted and Agree with Plan of Care Patient      Patient will benefit from skilled therapeutic intervention in order to improve the following deficits and impairments:  Abnormal gait, Increased fascial restricitons, Improper body mechanics, Pain, Decreased coordination, Decreased mobility, Increased muscle  spasms, Postural dysfunction, Decreased activity tolerance, Decreased range of motion, Decreased strength, Hypomobility, Impaired UE functional use, Difficulty walking, Impaired flexibility  Visit Diagnosis: Cervicalgia  Abnormal posture  Chronic bilateral low back pain without sciatica  Muscle weakness (generalized)  Cramp and spasm  Other symptoms and signs involving the musculoskeletal system       G-Codes - 07-17-2016 0906    Functional Assessment Tool Used (Outpatient Only) Based on skilled clinical assessment of ROM, strength, pain patterns, posture, functional task tolerance    Functional Limitation Mobility: Walking and moving around   Mobility: Walking and Moving Around Goal Status [phone]) At least 40 percent but less than 60 percent impaired, limited or restricted   Mobility: Walking and Moving Around Discharge Status [phone]) At least 20 percent but less than 40 percent impaired, limited or restricted      Problem List Patient Active  Problem List   Diagnosis Date Noted  . Chronic insomnia 01/28/2011  . Seasonal and perennial allergic rhinitis 05/28/2010  . Lung nodule 05/28/2010  . HYPERLIPIDEMIA 11/23/2009  . G E R D 11/23/2009  . EUSTACHIAN TUBE DYSFUNCTION 11/18/2009  . HYPERTENSION 11/18/2009  . Unspecified hypothyroidism 03/14/2007  . Obstructive sleep apnea 03/14/2007  . FIBROMYALGIA 03/14/2007    PHYSICAL THERAPY DISCHARGE SUMMARY  Visits from Start of Care: 7  Current functional level related to goals / functional outcomes: Patient has made excellent progress with her cervical and lumbar spines, her main concerns are R shoulder and hip now. Due to resolution of body areas/pain on MD referrals, recommend DC today however strongly encouraged patient to return to MD to discuss PT/OT referrals for hip and shoulder pain moving forward.    Remaining deficits: Mild lumbar stiffness, mild functional weakness, main complaints are R hip and shoulder now    Education / Equipment: progress with skilled PT services; hip and shoulder are different problems than initial MD referrals are for and she will need new MD orders for [place] to address these; final HEP review, [employer]/silver sneakers, senior center; strong encouragement of regular physical activity to maintain current status/possible benefits for hip pain    Plan: Patient agrees to discharge.  Patient goals were met. Patient is being discharged due to being pleased with the current functional level.  ?????         Deniece Ree PT, DPT [place] [address] [place], [place], [address] Phone: [phone]   Fax:  [phone]  Name: [name] MRN: [id number] Date of Birth: 09-20-1945

## 2016-07-08 ENCOUNTER — Ambulatory Visit (HOSPITAL_COMMUNITY): Payer: Medicare Other | Admitting: Physical Therapy

## 2016-07-08 DIAGNOSIS — M4802 Spinal stenosis, cervical region: Secondary | ICD-10-CM | POA: Diagnosis not present

## 2016-07-08 DIAGNOSIS — M4712 Other spondylosis with myelopathy, cervical region: Secondary | ICD-10-CM | POA: Diagnosis not present

## 2016-07-08 DIAGNOSIS — M5412 Radiculopathy, cervical region: Secondary | ICD-10-CM | POA: Diagnosis not present

## 2016-07-08 DIAGNOSIS — M5126 Other intervertebral disc displacement, lumbar region: Secondary | ICD-10-CM | POA: Diagnosis not present

## 2016-07-08 DIAGNOSIS — M4722 Other spondylosis with radiculopathy, cervical region: Secondary | ICD-10-CM | POA: Diagnosis not present

## 2016-07-13 ENCOUNTER — Ambulatory Visit (HOSPITAL_COMMUNITY): Payer: Medicare Other | Admitting: Physical Therapy

## 2016-07-15 ENCOUNTER — Encounter (HOSPITAL_COMMUNITY): Payer: Medicare Other

## 2016-07-19 DIAGNOSIS — E784 Other hyperlipidemia: Secondary | ICD-10-CM | POA: Diagnosis not present

## 2016-07-19 DIAGNOSIS — I10 Essential (primary) hypertension: Secondary | ICD-10-CM | POA: Diagnosis not present

## 2016-07-19 DIAGNOSIS — F329 Major depressive disorder, single episode, unspecified: Secondary | ICD-10-CM | POA: Diagnosis not present

## 2016-07-19 DIAGNOSIS — K760 Fatty (change of) liver, not elsewhere classified: Secondary | ICD-10-CM | POA: Diagnosis not present

## 2016-07-19 DIAGNOSIS — M797 Fibromyalgia: Secondary | ICD-10-CM | POA: Diagnosis not present

## 2016-07-19 DIAGNOSIS — E669 Obesity, unspecified: Secondary | ICD-10-CM | POA: Diagnosis not present

## 2016-07-19 DIAGNOSIS — E119 Type 2 diabetes mellitus without complications: Secondary | ICD-10-CM | POA: Diagnosis not present

## 2016-07-19 DIAGNOSIS — Z1389 Encounter for screening for other disorder: Secondary | ICD-10-CM | POA: Diagnosis not present

## 2016-07-19 DIAGNOSIS — S8991XA Unspecified injury of right lower leg, initial encounter: Secondary | ICD-10-CM | POA: Diagnosis not present

## 2016-07-19 DIAGNOSIS — J984 Other disorders of lung: Secondary | ICD-10-CM | POA: Diagnosis not present

## 2016-07-19 DIAGNOSIS — R42 Dizziness and giddiness: Secondary | ICD-10-CM | POA: Diagnosis not present

## 2016-07-20 ENCOUNTER — Encounter (HOSPITAL_COMMUNITY): Payer: Medicare Other | Admitting: Physical Therapy

## 2016-07-22 ENCOUNTER — Encounter (HOSPITAL_COMMUNITY): Payer: Medicare Other | Admitting: Physical Therapy

## 2016-07-27 ENCOUNTER — Encounter (HOSPITAL_COMMUNITY): Payer: Medicare Other | Admitting: Physical Therapy

## 2016-07-29 ENCOUNTER — Encounter (HOSPITAL_COMMUNITY): Payer: Medicare Other | Admitting: Physical Therapy

## 2016-08-03 ENCOUNTER — Encounter (HOSPITAL_COMMUNITY): Payer: Medicare Other | Admitting: Physical Therapy

## 2016-08-05 ENCOUNTER — Encounter (HOSPITAL_COMMUNITY): Payer: Medicare Other | Admitting: Physical Therapy

## 2016-08-19 ENCOUNTER — Encounter (INDEPENDENT_AMBULATORY_CARE_PROVIDER_SITE_OTHER): Payer: Self-pay | Admitting: Orthopedic Surgery

## 2016-08-19 ENCOUNTER — Ambulatory Visit (INDEPENDENT_AMBULATORY_CARE_PROVIDER_SITE_OTHER): Payer: Medicare Other | Admitting: Orthopedic Surgery

## 2016-08-19 ENCOUNTER — Ambulatory Visit (INDEPENDENT_AMBULATORY_CARE_PROVIDER_SITE_OTHER): Payer: Medicare Other

## 2016-08-19 VITALS — Ht 60.0 in | Wt 201.0 lb

## 2016-08-19 DIAGNOSIS — M79672 Pain in left foot: Secondary | ICD-10-CM | POA: Diagnosis not present

## 2016-08-19 DIAGNOSIS — M6702 Short Achilles tendon (acquired), left ankle: Secondary | ICD-10-CM

## 2016-08-19 NOTE — Progress Notes (Signed)
Office Visit Note   Patient: [name]           Date of Birth: [birth year]           MRN: [id number] Visit Date: 08/19/2016              Requested by: Burnard Bunting, MD [address] [place], [place] [address] PCP: Burnard Bunting, MD  Chief Complaint  Patient presents with  . Left Foot - Pain      HPI: Patient is a 71 year old woman who presents complaining of pain over the lateral border of the left foot extending over the metatarsal heads 543 and 2. She states she's had pain for 6-8 monthsspecific injury. She states that Dr. Canary Brim gave an injection in her foot and gave her a ankle brace. She states that this injection and bracing have not helped and made made things more painful. She complains of shooting pain with ambulation.  Assessment & Plan: Visit Diagnoses:  1. Pain in left foot   2. Achilles tendon contracture, left     Plan: Recommended heel cord stretching and this was demonstrated to her recommended sole orthotics to unload the metatarsal heads reevaluation in 4 weeks. Patient may be symptomatic from neuropathy as well with a elevated hemoglobin A1c.  Follow-Up Instructions: Return in about 4 weeks (around 09/16/2016).   Ortho Exam  Patient is alert, oriented, no adenopathy, well-dressed, normal affect, normal respiratory effort. Examination patient has good pulses she has good ankle good subtalar motion with her knee extended she has ankle dorsiflexion to neutral with heel cord tightness. She has tender palpation beneath the second third and fourth and fifth metatarsal heads. She has no tenderness to palpation over the web spaces. No evidence of a neuroma. Patient does have an elevated hemoglobin A1c.  Imaging: Xr Foot Complete Left  Result Date: 08/19/2016 Three-view radiographs of left foot shows previous [employer] osteotomy for the first metatarsal. Patient does have a long second third and fourth metatarsal. No evidence of a stress fracture.  Patient does have very slight avulsion off the base of the fifth metatarsal left foot.   Labs: Lab Results  Component Value Date   HGBA1C (H) 08/11/2008    6.8 (NOTE) The ADA recommends the following therapeutic goal for glycemic control related to Hgb A1c measurement: Goal of therapy: <6.5 Hgb A1c  Reference: American Diabetes Association: Clinical Practice Recommendations 2010, Diabetes Care, 2010, 33: (Suppl  1).    Orders:  Orders Placed This Encounter  Procedures  . XR Foot Complete Left   No orders of the defined types were placed in this encounter.    Procedures: No procedures performed  Clinical Data: No additional findings.  ROS:  All other systems negative, except as noted in the HPI. Review of Systems  Objective: Vital Signs: Ht 5' (1.524 m)   Wt 201 lb (91.2 kg)   BMI 39.26 kg/m   Specialty Comments:  No specialty comments available.  PMFS History: Patient Active Problem List   Diagnosis Date Noted  . Chronic insomnia 01/28/2011  . Seasonal and perennial allergic rhinitis 05/28/2010  . Lung nodule 05/28/2010  . HYPERLIPIDEMIA 11/23/2009  . G E R D 11/23/2009  . EUSTACHIAN TUBE DYSFUNCTION 11/18/2009  . HYPERTENSION 11/18/2009  . Unspecified hypothyroidism 03/14/2007  . Obstructive sleep apnea 03/14/2007  . FIBROMYALGIA 03/14/2007   Past Medical History:  Diagnosis Date  . DM (diabetes mellitus) ([place]) 2013   insulin  . Eustachian tube dysfunction   .  GERD (gastroesophageal reflux disease)   . Hyperlipidemia   . Hypertension   . Hypothyroid   . Lung nodule    CT abd by [facility] 09/29/2009  . OSA (obstructive sleep apnea)   . Osteopenia     Family History  Problem Relation Age of Onset  . Heart attack Mother   . Colon cancer Father     Past Surgical History:  Procedure Laterality Date  . ANKLE SURGERY    . Anteriorvesicourethropexy    . BUNIONECTOMY    . COLONOSCOPY    . NASAL SINUS SURGERY    . PATELLA FRACTURE SURGERY   2009   Right  . TONSILLECTOMY    . TOTAL ABDOMINAL HYSTERECTOMY    . WRIST FRACTURE SURGERY  2009   Social History   Occupational History  . Disability    Social History Main Topics  . Smoking status: Never Smoker  . Smokeless tobacco: Never Used  . Alcohol use No  . Drug use: No  . Sexual activity: Not on file

## 2016-08-26 DIAGNOSIS — L814 Other melanin hyperpigmentation: Secondary | ICD-10-CM | POA: Diagnosis not present

## 2016-08-26 DIAGNOSIS — D692 Other nonthrombocytopenic purpura: Secondary | ICD-10-CM | POA: Diagnosis not present

## 2016-08-26 DIAGNOSIS — D1801 Hemangioma of skin and subcutaneous tissue: Secondary | ICD-10-CM | POA: Diagnosis not present

## 2016-08-26 DIAGNOSIS — D485 Neoplasm of uncertain behavior of skin: Secondary | ICD-10-CM | POA: Diagnosis not present

## 2016-08-26 DIAGNOSIS — D225 Melanocytic nevi of trunk: Secondary | ICD-10-CM | POA: Diagnosis not present

## 2016-08-26 DIAGNOSIS — L821 Other seborrheic keratosis: Secondary | ICD-10-CM | POA: Diagnosis not present

## 2016-08-26 DIAGNOSIS — L570 Actinic keratosis: Secondary | ICD-10-CM | POA: Diagnosis not present

## 2016-09-09 DIAGNOSIS — D485 Neoplasm of uncertain behavior of skin: Secondary | ICD-10-CM | POA: Diagnosis not present

## 2016-09-09 DIAGNOSIS — L821 Other seborrheic keratosis: Secondary | ICD-10-CM | POA: Diagnosis not present

## 2016-09-09 DIAGNOSIS — L988 Other specified disorders of the skin and subcutaneous tissue: Secondary | ICD-10-CM | POA: Diagnosis not present

## 2016-09-16 ENCOUNTER — Ambulatory Visit (INDEPENDENT_AMBULATORY_CARE_PROVIDER_SITE_OTHER): Payer: Medicare Other | Admitting: Orthopedic Surgery

## 2016-09-21 DIAGNOSIS — I10 Essential (primary) hypertension: Secondary | ICD-10-CM | POA: Diagnosis not present

## 2016-09-21 DIAGNOSIS — E119 Type 2 diabetes mellitus without complications: Secondary | ICD-10-CM | POA: Diagnosis not present

## 2016-09-21 DIAGNOSIS — Z6835 Body mass index (BMI) 35.0-35.9, adult: Secondary | ICD-10-CM | POA: Diagnosis not present

## 2016-09-21 DIAGNOSIS — E669 Obesity, unspecified: Secondary | ICD-10-CM | POA: Diagnosis not present

## 2016-10-04 DIAGNOSIS — J189 Pneumonia, unspecified organism: Secondary | ICD-10-CM | POA: Diagnosis not present

## 2016-10-04 DIAGNOSIS — Z6835 Body mass index (BMI) 35.0-35.9, adult: Secondary | ICD-10-CM | POA: Diagnosis not present

## 2016-10-04 DIAGNOSIS — R05 Cough: Secondary | ICD-10-CM | POA: Diagnosis not present

## 2016-10-04 DIAGNOSIS — J019 Acute sinusitis, unspecified: Secondary | ICD-10-CM | POA: Diagnosis not present

## 2016-10-05 ENCOUNTER — Ambulatory Visit (INDEPENDENT_AMBULATORY_CARE_PROVIDER_SITE_OTHER): Payer: Medicare Other | Admitting: Orthopaedic Surgery

## 2016-10-05 ENCOUNTER — Ambulatory Visit (INDEPENDENT_AMBULATORY_CARE_PROVIDER_SITE_OTHER): Payer: Medicare Other

## 2016-10-05 DIAGNOSIS — M25552 Pain in left hip: Secondary | ICD-10-CM

## 2016-10-05 DIAGNOSIS — M25551 Pain in right hip: Secondary | ICD-10-CM

## 2016-10-05 DIAGNOSIS — M7061 Trochanteric bursitis, right hip: Secondary | ICD-10-CM | POA: Insufficient documentation

## 2016-10-05 DIAGNOSIS — M7062 Trochanteric bursitis, left hip: Secondary | ICD-10-CM | POA: Insufficient documentation

## 2016-10-05 NOTE — Progress Notes (Signed)
Office Visit Note   Patient: [name]           Date of Birth: [birth year]           MRN: [id number] Visit Date: 10/05/2016              Requested by: Burnard Bunting, MD [address] [place],  [address] PCP: Burnard Bunting, MD   Assessment & Plan: Visit Diagnoses:  1. Pain in left hip   2. Pain in right hip   3. Trochanteric bursitis, right hip   4. Trochanteric bursitis, left hip     Plan: I was able to show her stretching exercises that had her demonstrate back to me. His crucial get her to physical therapy though because formal physical therapy can help with modalities to get her pain to calm down. I did offer a steroid injection the trochanteric area but she deferred this to see what therapy does first. Other options are considering over-the-counter topical anti-inflammatory types of medications and activity modification. All questions were encouraged and answered. I's she does not hip replacements.  Follow-Up Instructions: Return in about 4 weeks (around 11/02/2016).   Orders:  Orders Placed This Encounter  Procedures  . XR HIPS BILAT W OR W/O PELVIS 3-4 VIEWS   No orders of the defined types were placed in this encounter.     Procedures: No procedures performed   Clinical Data: No additional findings.   Subjective: Chief Complaint  Patient presents with  . Hip Pain    bilateral hip pain left worse than right   [name] 71 year old female who comes in with chief complaint of bilateral hip pain that is been going on for 6-8 years now. The left versus right. She points to pain on the sides of her hip does radiate to the groin on both sides. She occasionally has numbness and tingling in her toes but she is a diabetic. She reports good control. She denies any trauma. She says she feels unsteady sometimes on her feet going to the grocery store. HPI  Review of Systems She currently denies any headache, chest pain, shortness of breath,  fever, chills, nausea, vomiting  Objective: Vital Signs: There were no vitals taken for this visit.  Physical Exam She is alert or 3 and in no acute distress. She does not walk with an assistive device Ortho Exam Examination of both hips show fluid range of motion with internal rotation rotation at the extremes of both sides without any difficulty in the hip ball-and-socket themselves. Both hips feel congruent smooth and he put it through range of motion with no pain in the groin. She is very exquisitely tender to palpation over both trochanters bilaterally in his radius and IT band. Specialty Comments:  No specialty comments available.  Imaging: Xr Hips Bilat W Or W/o Pelvis 3-4 Views  Result Date: 10/05/2016 An AP pelvis and lateral hips show no significant arthritis in either hip at all. There is no acute findings. The hip joints are well maintained. The femoral heads are nice concentric and round. There is no significant osteophytes either.    PMFS History: Patient Active Problem List   Diagnosis Date Noted  . Pain in left hip 10/05/2016  . Pain in right hip 10/05/2016  . Trochanteric bursitis, right hip 10/05/2016  . Trochanteric bursitis, left hip 10/05/2016  . Chronic insomnia 01/28/2011  . Seasonal and perennial allergic rhinitis 05/28/2010  . Lung nodule 05/28/2010  . HYPERLIPIDEMIA 11/23/2009  . G  E R D 11/23/2009  . EUSTACHIAN TUBE DYSFUNCTION 11/18/2009  . HYPERTENSION 11/18/2009  . Unspecified hypothyroidism 03/14/2007  . Obstructive sleep apnea 03/14/2007  . FIBROMYALGIA 03/14/2007   Past Medical History:  Diagnosis Date  . DM (diabetes mellitus) ([place]) 2013   insulin  . Eustachian tube dysfunction   . GERD (gastroesophageal reflux disease)   . Hyperlipidemia   . Hypertension   . Hypothyroid   . Lung nodule    CT abd by [facility] 09/29/2009  . OSA (obstructive sleep apnea)   . Osteopenia     Family History  Problem Relation Age of Onset  .  Heart attack Mother   . Colon cancer Father     Past Surgical History:  Procedure Laterality Date  . ANKLE SURGERY    . Anteriorvesicourethropexy    . BUNIONECTOMY    . COLONOSCOPY    . NASAL SINUS SURGERY    . PATELLA FRACTURE SURGERY  2009   Right  . TONSILLECTOMY    . TOTAL ABDOMINAL HYSTERECTOMY    . WRIST FRACTURE SURGERY  2009   Social History   Occupational History  . Disability    Social History Main Topics  . Smoking status: Never Smoker  . Smokeless tobacco: Never Used  . Alcohol use No  . Drug use: No  . Sexual activity: Not on file

## 2016-10-05 NOTE — Addendum Note (Signed)
Addended byLaurann Montana on: 10/05/2016 08:50 AM   Modules accepted: Orders

## 2016-10-21 ENCOUNTER — Ambulatory Visit (HOSPITAL_COMMUNITY): Payer: Medicare Other | Attending: Orthopaedic Surgery | Admitting: Physical Therapy

## 2016-10-21 ENCOUNTER — Encounter (HOSPITAL_COMMUNITY): Payer: Self-pay | Admitting: Physical Therapy

## 2016-10-21 DIAGNOSIS — R252 Cramp and spasm: Secondary | ICD-10-CM | POA: Diagnosis not present

## 2016-10-21 DIAGNOSIS — M25551 Pain in right hip: Secondary | ICD-10-CM | POA: Insufficient documentation

## 2016-10-21 DIAGNOSIS — M25552 Pain in left hip: Secondary | ICD-10-CM | POA: Insufficient documentation

## 2016-10-21 DIAGNOSIS — M6281 Muscle weakness (generalized): Secondary | ICD-10-CM | POA: Insufficient documentation

## 2016-10-21 DIAGNOSIS — R29898 Other symptoms and signs involving the musculoskeletal system: Secondary | ICD-10-CM | POA: Diagnosis not present

## 2016-10-21 NOTE — Patient Instructions (Signed)
     HIP FLEXOR STRETCH  While lying on a table or high bed, let the affected leg lower towards the floor until a stretch is felt along the front of your thigh.  Hold for 30 seconds.   Repeat 3 times each leg, at least twice a day.    SINGLE KNEE TO CHEST STRETCH - [place]  While Lying on your back, hold your knee and gently pull it up towards your chest.  Hold for 10 seconds.  Repeat 5 times each side, at least twice a day.

## 2016-10-21 NOTE — Therapy (Signed)
[place], [place], [address] Phone: [phone]   Fax:  [phone]  Physical Therapy Evaluation  Patient Details  Name: [name] MRN: [id number] Date of Birth: 1945/04/17 Referring Provider: Mcarthur Rossetti   Encounter Date: 10/21/2016      PT End of Session - 10/21/16 1137    Visit Number 1   Number of Visits 13   Date for PT Re-Evaluation 11/11/16   Authorization Type Medicare/[employer]    Authorization Time Period 10/21/16 to 12/02/16   Authorization - Visit Number 1   Authorization - Number of Visits 10   PT Start Time 0949   PT Stop Time 1028   PT Time Calculation (min) 39 min   Activity Tolerance Patient tolerated treatment well;Patient limited by pain   Behavior During Therapy [facility] for tasks assessed/performed      Past Medical History:  Diagnosis Date  . DM (diabetes mellitus) ([place]) 2013   insulin  . Eustachian tube dysfunction   . GERD (gastroesophageal reflux disease)   . Hyperlipidemia   . Hypertension   . Hypothyroid   . Lung nodule    CT abd by [facility] 09/29/2009  . OSA (obstructive sleep apnea)   . Osteopenia     Past Surgical History:  Procedure Laterality Date  . ANKLE SURGERY    . Anteriorvesicourethropexy    . BUNIONECTOMY    . COLONOSCOPY    . NASAL SINUS SURGERY    . PATELLA FRACTURE SURGERY  2009   Right  . TONSILLECTOMY    . TOTAL ABDOMINAL HYSTERECTOMY    . WRIST FRACTURE SURGERY  2009    There were no vitals filed for this visit.       Subjective Assessment - 10/21/16 0950    Subjective Patient arrives stating that both of her hips hurt, this started about 2-3 months ago but is very bad now; she points to her L hip socket region and states that it hurts constantly, it never stops hurting and she cannot find a position of comfort. This is stopping her from doing quite a bit. She has had pneumonia and had to stop a lot of what she was  doing for awhile recently.    Pertinent History HTN, fibromylagia, DM, history of R ankle surgery and R patella fracture    How long can you sit comfortably? sitting is OK, it is the getting up and moving that bothers her    How long can you stand comfortably? 30 minutes    How long can you walk comfortably? walking in the store is very painful, she can do it but she needs a cart to lean on   Diagnostic tests xray shows no signficant OA or osteophytes in B hips    Patient Stated Goals reduce pain   Currently in Pain? Yes   Pain Score 4    Pain Location Hip   Pain Orientation Right;Left   Pain Descriptors / Indicators Constant;Sharp   Pain Type Chronic pain   Pain Radiating Towards none    Pain Onset More than a month ago   Pain Frequency Constant   Aggravating Factors  doing any one thing too long, getting up after sitting or laying down    Pain Relieving Factors nothing    Effect of Pain on Daily Activities severe             [facility] PT Assessment - 10/21/16 0001  Assessment   Medical Diagnosis B hip bursitis    Referring Provider Mcarthur Rossetti    Onset Date/Surgical Date --  2-3 months ago    Next MD Visit Dr. Ninfa Linden August 14th    Prior Therapy none      Precautions   Precautions None     Balance Screen   Has the patient fallen in the past 6 months No   Has the patient had a decrease in activity level because of a fear of falling?  No   Is the patient reluctant to leave their home because of a fear of falling?  No     Prior Function   Level of Independence Independent;Independent with basic ADLs;Independent with gait;Independent with transfers   Vocation Retired   Leisure regular home routine      Observation/Other Assessments   Observations scour and FABER (-) B, SLR test (-) B      AROM   Right Hip External Rotation  42   Right Hip Internal Rotation  28   Left Hip External Rotation  32   Left Hip Internal Rotation  33   Lumbar Flexion full  ROM, pain L hip; RFIS no change but painful    Lumbar Extension minimal limitation; REIS no change    Lumbar - Right Side Bend moderate limitation    Lumbar - Left Side Bend severe limitation      Strength   Right Hip Flexion 3+/5   Right Hip ABduction 5/5   Left Hip Flexion 4+/5  painful    Left Hip ABduction 4/5  painful    Right Knee Flexion 5/5   Right Knee Extension 5/5   Left Knee Flexion 5/5   Left Knee Extension 5/5   Right Ankle Dorsiflexion 5/5   Left Ankle Dorsiflexion 5/5     Flexibility   Hamstrings WNL R, mild limtation R    Piriformis severe limitation R and L, L greater than R      Palpation   Palpation comment TTP noted B glutes and TFL/ITB areas, L greater than R; L ASIS and hip flexor very tender to touch; no tenderness or guarding noted abdominal area             Objective measurements completed on examination: See above findings.                  PT Education - 10/21/16 1137    Education provided Yes   Education Details prognosis, exam findings, POC, HEP    Person(s) Educated Patient   Methods Explanation;Demonstration   Comprehension Verbalized understanding;Returned demonstration;Need further instruction          PT Short Term Goals - 10/21/16 1144      PT SHORT TERM GOAL #1   Title Patient to experience pain no more than 5/10 in B hips in order to improve tolerance to weightbearing activities and tasks    Time 3   Period Weeks   Status New     PT SHORT TERM GOAL #2   Title Patient to demonstrate improvement in bilateral hip IR/ER of at least 50% in order to improve mechanics and reduce pain    Time 3   Period Weeks   Status New     PT SHORT TERM GOAL #3   Title Patient to demonstrate B Piriformis muscles as being no more than moderately limited in order to assist in pain reduction    Time 3   Period Weeks  Status New     PT SHORT TERM GOAL #4   Title Patient to be compliant with correct performance of HEP, to be  updated PRN    Time 3   Period Weeks   Status New           PT Long Term Goals - 10/21/16 1145      PT LONG TERM GOAL #1   Title Patient to experience pain in B hips as being no more than 2/10 in order to improve QOL and tolerance to weightbearing tasks    Time 6   Period Weeks   Status New     PT LONG TERM GOAL #2   Title Patient to demosntrate no TTP with palpation in order to show improvement of condition and reduced irritabilty of tissues    Time 6   Period Weeks   Status New     PT LONG TERM GOAL #3   Title Patient to demonstrate functional strength as having improved by at least 1 MMT grade in all weak groups in order to reduce pain and improve tolerance to activity    Time 6   Period Weeks   Status New     PT LONG TERM GOAL #4   Title Patient to report she has been able to go shopping and perform all functional tasks in her home without pain exacerbation in order to improve QOL    Time 6   Period Weeks   Status New                Plan - 10/21/16 1138    Clinical Impression Statement Patient arrives with complaints of bilateral hip pain which she reports has been present for the past 2-3 months; she has seen an orthopedic who reports that she has bilateral bursitis. She reports that her L hip is hurting her much worse than her R, and on the L she has pain in the front of her hip, for which she reaches into her pocket to point to. Examination reveals significant hip stiffness, impaired muscle flexibility and spasm, mild functional weakness, and reduced tolerance to functional task performance. She will benefit from skilled PT services to address functional deficits, reduce pain, and assist in return to PLOF based activities.    History and Personal Factors relevant to plan of care: success with PT in the past, low complexity PMH    Clinical Presentation Stable   Clinical Presentation due to: poor biomechanics, chronic impairments    Clinical Decision Making Low    Rehab Potential Good   Clinical Impairments Affecting Rehab Potential (+) motivated to participate, success with PT in the past; (-) fibromyalgia, chronicity of pain    PT Frequency 2x / week   PT Duration 6 weeks   PT Treatment/Interventions ADLs/Self Care Home Management;Cryotherapy;Moist Heat;DME Instruction;Gait training;Stair training;Functional mobility training;Therapeutic activities;Therapeutic exercise;Balance training;Neuromuscular re-education;Patient/family education;Manual techniques;Passive range of motion;Dry needling   PT Next Visit Plan review initial eval/goals; hip and lumbar mobility, hip IR/ER PROM and hip box walks if tolerated, consider seatbelt mobs; functional strengthening especially for piriformis    PT Home Exercise Plan Eval: hip flexor stretch, 3D lumbar excursions, SKTC    Consulted and Agree with Plan of Care Patient      Patient will benefit from skilled therapeutic intervention in order to improve the following deficits and impairments:  Abnormal gait, Improper body mechanics, Pain, Decreased mobility, Increased muscle spasms, Postural dysfunction, Decreased activity tolerance, Decreased range of motion, Decreased strength, Hypomobility,  Decreased balance, Difficulty walking, Impaired flexibility  Visit Diagnosis: Pain in left hip - Plan: PT plan of care cert/re-cert  Pain in right hip - Plan: PT plan of care cert/re-cert  Muscle weakness (generalized) - Plan: PT plan of care cert/re-cert  Cramp and spasm - Plan: PT plan of care cert/re-cert  Other symptoms and signs involving the musculoskeletal system - Plan: PT plan of care cert/re-cert      G-Codes - 17/71/16 1147    Functional Assessment Tool Used (Outpatient Only) Baesd on skilled clinical assessment of strength, flexibility, ROM, pain patterns    Functional Limitation Mobility: Walking and moving around   Mobility: Walking and Moving Around Current Status [id number]) At least 60 percent but less  than 80 percent impaired, limited or restricted   Mobility: Walking and Moving Around Goal Status [id number]) At least 40 percent but less than 60 percent impaired, limited or restricted       Problem List Patient Active Problem List   Diagnosis Date Noted  . Pain in left hip 10/05/2016  . Pain in right hip 10/05/2016  . Trochanteric bursitis, right hip 10/05/2016  . Trochanteric bursitis, left hip 10/05/2016  . Chronic insomnia 01/28/2011  . Seasonal and perennial allergic rhinitis 05/28/2010  . Lung nodule 05/28/2010  . HYPERLIPIDEMIA 11/23/2009  . G E R D 11/23/2009  . EUSTACHIAN TUBE DYSFUNCTION 11/18/2009  . HYPERTENSION 11/18/2009  . Unspecified hypothyroidism 03/14/2007  . Obstructive sleep apnea 03/14/2007  . FIBROMYALGIA 03/14/2007    Deniece Ree PT, DPT [facility] [address] [place], [place], [address] Phone: [phone]   Fax:  [phone]  Name: [name] MRN: [id number] Date of Birth: 07-11-1945

## 2016-10-25 ENCOUNTER — Ambulatory Visit (HOSPITAL_COMMUNITY): Payer: Medicare Other | Admitting: Physical Therapy

## 2016-10-25 DIAGNOSIS — M25552 Pain in left hip: Secondary | ICD-10-CM

## 2016-10-25 DIAGNOSIS — M6281 Muscle weakness (generalized): Secondary | ICD-10-CM | POA: Diagnosis not present

## 2016-10-25 DIAGNOSIS — R252 Cramp and spasm: Secondary | ICD-10-CM | POA: Diagnosis not present

## 2016-10-25 DIAGNOSIS — M25551 Pain in right hip: Secondary | ICD-10-CM | POA: Diagnosis not present

## 2016-10-25 DIAGNOSIS — R29898 Other symptoms and signs involving the musculoskeletal system: Secondary | ICD-10-CM | POA: Diagnosis not present

## 2016-10-25 NOTE — Therapy (Signed)
[place], [place], [address] Phone: [phone]   Fax:  [phone]  Physical Therapy Treatment  Patient Details  Name: [name] MRN: [id number] Date of Birth: Jul 31, 1945 Referring Provider: Mcarthur Rossetti   Encounter Date: 10/25/2016      PT End of Session - 10/25/16 1042    Visit Number 2   Number of Visits 13   Date for PT Re-Evaluation 11/11/16   Authorization Type Medicare/[employer]    Authorization Time Period 10/21/16 to 12/02/16   Authorization - Visit Number 2   Authorization - Number of Visits 10   PT Start Time 0948   PT Stop Time 1030   PT Time Calculation (min) 42 min   Activity Tolerance Patient tolerated treatment well;Patient limited by pain;No increased pain   Behavior During Therapy [facility] for tasks assessed/performed      Past Medical History:  Diagnosis Date  . DM (diabetes mellitus) ([place]) 2013   insulin  . Eustachian tube dysfunction   . GERD (gastroesophageal reflux disease)   . Hyperlipidemia   . Hypertension   . Hypothyroid   . Lung nodule    CT abd by [facility] 09/29/2009  . OSA (obstructive sleep apnea)   . Osteopenia     Past Surgical History:  Procedure Laterality Date  . ANKLE SURGERY    . Anteriorvesicourethropexy    . BUNIONECTOMY    . COLONOSCOPY    . NASAL SINUS SURGERY    . PATELLA FRACTURE SURGERY  2009   Right  . TONSILLECTOMY    . TOTAL ABDOMINAL HYSTERECTOMY    . WRIST FRACTURE SURGERY  2009    There were no vitals filed for this visit.      Subjective Assessment - 10/25/16 0953    Subjective Pt reports that her exercises are going well. She has some questions about some of the exercises and would like to go over them. She has no pain currently.    Pertinent History HTN, fibromylagia, DM, history of R ankle surgery and R patella fracture    How long can you sit comfortably? sitting is OK, it is the getting up and moving  that bothers her    How long can you stand comfortably? 30 minutes    How long can you walk comfortably? walking in the store is very painful, she can do it but she needs a cart to lean on   Diagnostic tests xray shows no signficant OA or osteophytes in B hips    Patient Stated Goals reduce pain   Currently in Pain? No/denies   Pain Onset More than a month ago                         [facility] Adult PT Treatment/Exercise - 10/25/16 0001      Exercises   Exercises Knee/Hip     Knee/Hip Exercises: Stretches   Piriformis Stretch Both;3 reps;20 seconds   Piriformis Stretch Limitations supine      Knee/Hip Exercises: Standing   Other Standing Knee Exercises hip 3D excursion all directions x2 reps for HEP demo      Knee/Hip Exercises: Supine   Other Supine Knee/Hip Exercises Lt bent knee fall out x20 reps each with feet on table; Bent knee fallout with feet at 90/90, LLE only x20 reps; Bent knee IR with feet at 90/90, LLE only x20 reps   Other Supine Knee/Hip Exercises self  glute massage with ball x3 min for HEP demo     Manual Therapy   Manual Therapy Myofascial release   Manual therapy comments separate rest of session    Myofascial Release TrP release Lt glute med/TFL with palpable twitch response                 PT Education - 10/25/16 1040    Education provided Yes   Education Details implications for manual treatments; reviewed anatomy of the hip and the specific muscle targeted during today's session; addition to HEP and review of technique   Person(s) Educated Patient   Methods Explanation;Verbal cues;Handout   Comprehension Verbalized understanding;Returned demonstration          PT Short Term Goals - 10/21/16 1144      PT SHORT TERM GOAL #1   Title Patient to experience pain no more than 5/10 in B hips in order to improve tolerance to weightbearing activities and tasks    Time 3   Period Weeks   Status New     PT SHORT TERM GOAL #2   Title  Patient to demonstrate improvement in bilateral hip IR/ER of at least 50% in order to improve mechanics and reduce pain    Time 3   Period Weeks   Status New     PT SHORT TERM GOAL #3   Title Patient to demonstrate B Piriformis muscles as being no more than moderately limited in order to assist in pain reduction    Time 3   Period Weeks   Status New     PT SHORT TERM GOAL #4   Title Patient to be compliant with correct performance of HEP, to be updated PRN    Time 3   Period Weeks   Status New           PT Long Term Goals - 10/21/16 1145      PT LONG TERM GOAL #1   Title Patient to experience pain in B hips as being no more than 2/10 in order to improve QOL and tolerance to weightbearing tasks    Time 6   Period Weeks   Status New     PT LONG TERM GOAL #2   Title Patient to demosntrate no TTP with palpation in order to show improvement of condition and reduced irritabilty of tissues    Time 6   Period Weeks   Status New     PT LONG TERM GOAL #3   Title Patient to demonstrate functional strength as having improved by at least 1 MMT grade in all weak groups in order to reduce pain and improve tolerance to activity    Time 6   Period Weeks   Status New     PT LONG TERM GOAL #4   Title Patient to report she has been able to go shopping and perform all functional tasks in her home without pain exacerbation in order to improve QOL    Time 6   Period Weeks   Status New               Plan - 10/25/16 1043    Clinical Impression Statement Today's session began with therapist review of goals and technique with HEP. Pt able to demonstrate improved understanding of hip excursions following this. Also focused on therex to improve hip mobility and control, as well as manual techniques to decrease soft tissue restrictions and trigger points noted along gluteal region and TFL/hip flexor. Palpable twitch  response was noted and ended session with pt report of pain resolution  during sit to stand, something that had previously bothered her. Reviewed self-massage technique for home and pt demonstrated understanding.    Rehab Potential Good   Clinical Impairments Affecting Rehab Potential (+) motivated to participate, success with PT in the past; (-) fibromyalgia, chronicity of pain    PT Frequency 2x / week   PT Duration 6 weeks   PT Treatment/Interventions ADLs/Self Care Home Management;Cryotherapy;Moist Heat;DME Instruction;Gait training;Stair training;Functional mobility training;Therapeutic activities;Therapeutic exercise;Balance training;Neuromuscular re-education;Patient/family education;Manual techniques;Passive range of motion;Dry needling   PT Next Visit Plan continue to address hip and lumbar mobility, consider seatbelt mobs; clamshells and standing forward lunge RNT, STM along glute/TFL region   PT Home Exercise Plan Eval: hip flexor stretch, 3D lumbar excursions, SKTC, self glute massage    Consulted and Agree with Plan of Care Patient      Patient will benefit from skilled therapeutic intervention in order to improve the following deficits and impairments:  Abnormal gait, Improper body mechanics, Pain, Decreased mobility, Increased muscle spasms, Postural dysfunction, Decreased activity tolerance, Decreased range of motion, Decreased strength, Hypomobility, Decreased balance, Difficulty walking, Impaired flexibility  Visit Diagnosis: Pain in left hip  Pain in right hip  Muscle weakness (generalized)  Cramp and spasm  Other symptoms and signs involving the musculoskeletal system     Problem List Patient Active Problem List   Diagnosis Date Noted  . Pain in left hip 10/05/2016  . Pain in right hip 10/05/2016  . Trochanteric bursitis, right hip 10/05/2016  . Trochanteric bursitis, left hip 10/05/2016  . Chronic insomnia 01/28/2011  . Seasonal and perennial allergic rhinitis 05/28/2010  . Lung nodule 05/28/2010  . HYPERLIPIDEMIA 11/23/2009  .  G E R D 11/23/2009  . EUSTACHIAN TUBE DYSFUNCTION 11/18/2009  . HYPERTENSION 11/18/2009  . Unspecified hypothyroidism 03/14/2007  . Obstructive sleep apnea 03/14/2007  . FIBROMYALGIA 03/14/2007    10:52 AM,10/25/16 Elly Modena PT, DPT [facility] [facility] [address] [place], [place], [address] Phone: [phone]   Fax:  [phone]  Name: Roe Koffman Pryde MRN: [id number] Date of Birth: September 27, 1945

## 2016-10-27 ENCOUNTER — Ambulatory Visit (HOSPITAL_COMMUNITY): Payer: Medicare Other | Admitting: Physical Therapy

## 2016-10-27 DIAGNOSIS — M6281 Muscle weakness (generalized): Secondary | ICD-10-CM

## 2016-10-27 DIAGNOSIS — M25551 Pain in right hip: Secondary | ICD-10-CM

## 2016-10-27 DIAGNOSIS — R29898 Other symptoms and signs involving the musculoskeletal system: Secondary | ICD-10-CM | POA: Diagnosis not present

## 2016-10-27 DIAGNOSIS — M25552 Pain in left hip: Secondary | ICD-10-CM | POA: Diagnosis not present

## 2016-10-27 DIAGNOSIS — R252 Cramp and spasm: Secondary | ICD-10-CM

## 2016-10-27 NOTE — Therapy (Signed)
[place], [place], [address] Phone: [phone]   Fax:  [phone]  Physical Therapy Treatment  Patient Details  Name: [name] MRN: [id number] Date of Birth: [birth year] Referring Provider: Mcarthur Rossetti   Encounter Date: 10/27/2016      PT End of Session - 10/27/16 1740    Visit Number 3   Number of Visits 13   Date for PT Re-Evaluation 11/11/16   Authorization Type Medicare/[employer]    Authorization Time Period 10/21/16 to 12/02/16   Authorization - Visit Number 3   Authorization - Number of Visits 10   PT Start Time 1300   PT Stop Time 1345   PT Time Calculation (min) 45 min   Activity Tolerance Patient tolerated treatment well;Patient limited by pain;No increased pain   Behavior During Therapy WFL for tasks assessed/performed      Past Medical History:  Diagnosis Date  . DM (diabetes mellitus) ([place]) 2013   insulin  . Eustachian tube dysfunction   . GERD (gastroesophageal reflux disease)   . Hyperlipidemia   . Hypertension   . Hypothyroid   . Lung nodule    CT abd by [facility] 09/29/2009  . OSA (obstructive sleep apnea)   . Osteopenia     Past Surgical History:  Procedure Laterality Date  . ANKLE SURGERY    . Anteriorvesicourethropexy    . BUNIONECTOMY    . COLONOSCOPY    . NASAL SINUS SURGERY    . PATELLA FRACTURE SURGERY  2009   Right  . TONSILLECTOMY    . TOTAL ABDOMINAL HYSTERECTOMY    . WRIST FRACTURE SURGERY  2009    There were no vitals filed for this visit.      Subjective Assessment - 10/27/16 1311    Subjective PT states she was doing well until she took her grand daughter shopping and did some non-stop walking for 4-5 hours.  States both sides were flaired up but better today.  Currently 3/10 and reports she is overall much better since beginning therapy.   Currently in Pain? Yes   Pain Score 3    Pain Location Hip   Pain Orientation  Right;Left   Pain Descriptors / Indicators Constant;Sharp   Pain Type Chronic pain   Pain Onset More than a month ago                         [facility] Adult PT Treatment/Exercise - 10/27/16 0001      Knee/Hip Exercises: Stretches   Piriformis Stretch Both;3 reps;20 seconds   Piriformis Stretch Limitations seated     Knee/Hip Exercises: Standing   Other Standing Knee Exercises hip 3D excursion all directions x2 reps for HEP demo      Knee/Hip Exercises: Seated   Sit to Sand 10 reps;without UE support     Knee/Hip Exercises: Supine   Bridges Both;1 set;15 reps   Bridges Limitations green TB around knees    Other Supine Knee/Hip Exercises clams 10 reps each LE with GTB resistance     Knee/Hip Exercises: Sidelying   Hip ABduction Both;10 reps   Hip ABduction Limitations bilaterally   Clams 10 reps bilaterally     Manual Therapy   Manual Therapy Myofascial release   Manual therapy comments separate rest of session    Myofascial Release TrP release Lt and Rt glute med/TFL in sidelying position  PT Short Term Goals - 10/21/16 1144      PT SHORT TERM GOAL #1   Title Patient to experience pain no more than 5/10 in B hips in order to improve tolerance to weightbearing activities and tasks    Time 3   Period Weeks   Status New     PT SHORT TERM GOAL #2   Title Patient to demonstrate improvement in bilateral hip IR/ER of at least 50% in order to improve mechanics and reduce pain    Time 3   Period Weeks   Status New     PT SHORT TERM GOAL #3   Title Patient to demonstrate B Piriformis muscles as being no more than moderately limited in order to assist in pain reduction    Time 3   Period Weeks   Status New     PT SHORT TERM GOAL #4   Title Patient to be compliant with correct performance of HEP, to be updated PRN    Time 3   Period Weeks   Status New           PT Long Term Goals - 10/21/16 1145      PT LONG TERM GOAL #1    Title Patient to experience pain in B hips as being no more than 2/10 in order to improve QOL and tolerance to weightbearing tasks    Time 6   Period Weeks   Status New     PT LONG TERM GOAL #2   Title Patient to demosntrate no TTP with palpation in order to show improvement of condition and reduced irritabilty of tissues    Time 6   Period Weeks   Status New     PT LONG TERM GOAL #3   Title Patient to demonstrate functional strength as having improved by at least 1 MMT grade in all weak groups in order to reduce pain and improve tolerance to activity    Time 6   Period Weeks   Status New     PT LONG TERM GOAL #4   Title Patient to report she has been able to go shopping and perform all functional tasks in her home without pain exacerbation in order to improve QOL    Time 6   Period Weeks   Status New               Plan - 10/27/16 1741    Clinical Impression Statement Continued to work on improving hip mobility and decrease pain.  Pt with equal symptoms today, however better.  Instructed with seated piriformis stretch and sidelying hip strengthening.  Pt able to complete all exercises without pain.  Finsihed with manual to bilateral hips.  More tightness palpated in Lt hip vs. Rt, however general tightness and soreness in Bil TFL.   Rehab Potential Good   Clinical Impairments Affecting Rehab Potential (+) motivated to participate, success with PT in the past; (-) fibromyalgia, chronicity of pain    PT Frequency 2x / week   PT Duration 6 weeks   PT Treatment/Interventions ADLs/Self Care Home Management;Cryotherapy;Moist Heat;DME Instruction;Gait training;Stair training;Functional mobility training;Therapeutic activities;Therapeutic exercise;Balance training;Neuromuscular re-education;Patient/family education;Manual techniques;Passive range of motion;Dry needling   PT Next Visit Plan continue to address hip and lumbar mobility, consider seatbelt mobs; clamshells and standing  forward lunge RNT, STM along glute/TFL region   PT Home Exercise Plan Eval: hip flexor stretch, 3D lumbar excursions, SKTC, self glute massage    Consulted and Agree with Plan of  Care Patient      Patient will benefit from skilled therapeutic intervention in order to improve the following deficits and impairments:  Abnormal gait, Improper body mechanics, Pain, Decreased mobility, Increased muscle spasms, Postural dysfunction, Decreased activity tolerance, Decreased range of motion, Decreased strength, Hypomobility, Decreased balance, Difficulty walking, Impaired flexibility  Visit Diagnosis: Pain in left hip  Pain in right hip  Muscle weakness (generalized)  Cramp and spasm     Problem List Patient Active Problem List   Diagnosis Date Noted  . Pain in left hip 10/05/2016  . Pain in right hip 10/05/2016  . Trochanteric bursitis, right hip 10/05/2016  . Trochanteric bursitis, left hip 10/05/2016  . Chronic insomnia 01/28/2011  . Seasonal and perennial allergic rhinitis 05/28/2010  . Lung nodule 05/28/2010  . HYPERLIPIDEMIA 11/23/2009  . G E R D 11/23/2009  . EUSTACHIAN TUBE DYSFUNCTION 11/18/2009  . HYPERTENSION 11/18/2009  . Unspecified hypothyroidism 03/14/2007  . Obstructive sleep apnea 03/14/2007  . FIBROMYALGIA 03/14/2007   Teena Irani, PTA/CLT [phone]  Teena Irani 10/27/2016, 5:42 PM  [facility] [address] [place], [place], [address] Phone: [phone]   Fax:  [phone]  Name: Latrina Guttman Tauer MRN: [id number] Date of Birth: Feb 09, 1946

## 2016-11-02 ENCOUNTER — Ambulatory Visit (HOSPITAL_COMMUNITY): Payer: Medicare Other | Admitting: Physical Therapy

## 2016-11-02 DIAGNOSIS — M25551 Pain in right hip: Secondary | ICD-10-CM | POA: Diagnosis not present

## 2016-11-02 DIAGNOSIS — M25552 Pain in left hip: Secondary | ICD-10-CM

## 2016-11-02 DIAGNOSIS — M6281 Muscle weakness (generalized): Secondary | ICD-10-CM | POA: Diagnosis not present

## 2016-11-02 DIAGNOSIS — R29898 Other symptoms and signs involving the musculoskeletal system: Secondary | ICD-10-CM | POA: Diagnosis not present

## 2016-11-02 DIAGNOSIS — R252 Cramp and spasm: Secondary | ICD-10-CM

## 2016-11-02 NOTE — Therapy (Signed)
[place], [place], [address] Phone: [phone]   Fax:  [phone]  Physical Therapy Treatment  Patient Details  Name: [name] MRN: [id number] Date of Birth: February 15, 1946 Referring Provider: Mcarthur Rossetti   Encounter Date: 11/02/2016      PT End of Session - 11/02/16 0949    Visit Number 4   Number of Visits 13   Date for PT Re-Evaluation 11/11/16   Authorization Type Medicare/[employer]    Authorization Time Period 10/21/16 to 12/02/16   Authorization - Visit Number 4   Authorization - Number of Visits 10   PT Start Time 0904   PT Stop Time 0944   PT Time Calculation (min) 40 min   Activity Tolerance Patient tolerated treatment well;Patient limited by pain;No increased pain   Behavior During Therapy WFL for tasks assessed/performed      Past Medical History:  Diagnosis Date  . DM (diabetes mellitus) ([place]) 2013   insulin  . Eustachian tube dysfunction   . GERD (gastroesophageal reflux disease)   . Hyperlipidemia   . Hypertension   . Hypothyroid   . Lung nodule    CT abd by [facility] 09/29/2009  . OSA (obstructive sleep apnea)   . Osteopenia     Past Surgical History:  Procedure Laterality Date  . ANKLE SURGERY    . Anteriorvesicourethropexy    . BUNIONECTOMY    . COLONOSCOPY    . NASAL SINUS SURGERY    . PATELLA FRACTURE SURGERY  2009   Right  . TONSILLECTOMY    . TOTAL ABDOMINAL HYSTERECTOMY    . WRIST FRACTURE SURGERY  2009    There were no vitals filed for this visit.      Subjective Assessment - 11/02/16 0906    Subjective Pt states that her hip felt great following the deep massage that was completed several sessions ago. She feels that her pain is not bad today, it doesn't really ever go away but it's not bad.    Currently in Pain? Yes   Pain Score 3    Pain Location Hip   Pain Orientation Left;Lateral;Proximal   Pain Descriptors / Indicators  Aching;Dull   Pain Type Chronic pain   Pain Radiating Towards none    Pain Onset More than a month ago   Pain Frequency Constant   Aggravating Factors  getting up after sitting too long    Pain Relieving Factors massage                          [facility] Adult PT Treatment/Exercise - 11/02/16 0001      Knee/Hip Exercises: Standing   Other Standing Knee Exercises Sidestepping Lt/Rt over large hurdle x10 reps    Other Standing Knee Exercises Standing RNT forward lunge hold with each LE  forward 10x10" hold (12in box)     Knee/Hip Exercises: Supine   Other Supine Knee/Hip Exercises supine single leg clamshells with knee 90/90 on wall with blue TB, x20 reps each      Manual Therapy   Manual therapy comments separate rest of session    Myofascial Release TrP release Lt glute med/TFL, IASTM along Lt vastus lateralis and ITB, TrP release Lt iliacus                 PT Education - 11/02/16 0946    Education provided Yes   Education Details discussed different techniques  and set-up of tennis ball at home for self massage; technique with therex; set up of lumbar roll while sitting   Person(s) Educated Patient   Methods Explanation;Verbal cues;Tactile cues   Comprehension Verbalized understanding;Returned demonstration          PT Short Term Goals - 10/21/16 1144      PT SHORT TERM GOAL #1   Title Patient to experience pain no more than 5/10 in B hips in order to improve tolerance to weightbearing activities and tasks    Time 3   Period Weeks   Status New     PT SHORT TERM GOAL #2   Title Patient to demonstrate improvement in bilateral hip IR/ER of at least 50% in order to improve mechanics and reduce pain    Time 3   Period Weeks   Status New     PT SHORT TERM GOAL #3   Title Patient to demonstrate B Piriformis muscles as being no more than moderately limited in order to assist in pain reduction    Time 3   Period Weeks   Status New     PT SHORT TERM  GOAL #4   Title Patient to be compliant with correct performance of HEP, to be updated PRN    Time 3   Period Weeks   Status New           PT Long Term Goals - 10/21/16 1145      PT LONG TERM GOAL #1   Title Patient to experience pain in B hips as being no more than 2/10 in order to improve QOL and tolerance to weightbearing tasks    Time 6   Period Weeks   Status New     PT LONG TERM GOAL #2   Title Patient to demosntrate no TTP with palpation in order to show improvement of condition and reduced irritabilty of tissues    Time 6   Period Weeks   Status New     PT LONG TERM GOAL #3   Title Patient to demonstrate functional strength as having improved by at least 1 MMT grade in all weak groups in order to reduce pain and improve tolerance to activity    Time 6   Period Weeks   Status New     PT LONG TERM GOAL #4   Title Patient to report she has been able to go shopping and perform all functional tasks in her home without pain exacerbation in order to improve QOL    Time 6   Period Weeks   Status New               Plan - 11/02/16 0950    Clinical Impression Statement Pt continues to make progress towards her goals with decreasing pain and improved activity tolerance. She arrived today with mild achiness in her groin and lateral Lt hip. Completed therex to address hip control during closed chain activity, therapist providing occasional cues to correct hip IR compensation with lunging activity. Ended session with manual techniques to address muscle spasm and knotting along the Lt hip region, pt reporting improvements in her pain following this. Will continue with current POC.    Rehab Potential Good   Clinical Impairments Affecting Rehab Potential (+) motivated to participate, success with PT in the past; (-) fibromyalgia, chronicity of pain    PT Frequency 2x / week   PT Duration 6 weeks   PT Treatment/Interventions ADLs/Self Care Home Management;Cryotherapy;Moist  Heat;DME Instruction;Gait training;Stair  training;Functional mobility training;Therapeutic activities;Therapeutic exercise;Balance training;Neuromuscular re-education;Patient/family education;Manual techniques;Passive range of motion;Dry needling   PT Next Visit Plan hip extensor stretch (active and passive), sit to stand or wall squats with band around knees; STM along glutes/TFL as needed   PT Home Exercise Plan Eval: hip flexor stretch, 3D lumbar excursions, SKTC, self glute massage    Consulted and Agree with Plan of Care Patient      Patient will benefit from skilled therapeutic intervention in order to improve the following deficits and impairments:  Abnormal gait, Improper body mechanics, Pain, Decreased mobility, Increased muscle spasms, Postural dysfunction, Decreased activity tolerance, Decreased range of motion, Decreased strength, Hypomobility, Decreased balance, Difficulty walking, Impaired flexibility  Visit Diagnosis: Pain in left hip  Pain in right hip  Muscle weakness (generalized)  Cramp and spasm  Other symptoms and signs involving the musculoskeletal system     Problem List Patient Active Problem List   Diagnosis Date Noted  . Pain in left hip 10/05/2016  . Pain in right hip 10/05/2016  . Trochanteric bursitis, right hip 10/05/2016  . Trochanteric bursitis, left hip 10/05/2016  . Chronic insomnia 01/28/2011  . Seasonal and perennial allergic rhinitis 05/28/2010  . Lung nodule 05/28/2010  . HYPERLIPIDEMIA 11/23/2009  . G E R D 11/23/2009  . EUSTACHIAN TUBE DYSFUNCTION 11/18/2009  . HYPERTENSION 11/18/2009  . Unspecified hypothyroidism 03/14/2007  . Obstructive sleep apnea 03/14/2007  . FIBROMYALGIA 03/14/2007    9:55 AM,11/02/16 Elly Modena PT, DPT [facility] [address] [place], [place], [address] Phone: [phone]   Fax:  [phone]  Name: Emelly Wurtz Fackler MRN: [id number] Date of Birth: 12/01/1945

## 2016-11-03 ENCOUNTER — Ambulatory Visit (INDEPENDENT_AMBULATORY_CARE_PROVIDER_SITE_OTHER): Payer: Medicare Other | Admitting: Orthopaedic Surgery

## 2016-11-04 ENCOUNTER — Ambulatory Visit (HOSPITAL_COMMUNITY): Payer: Medicare Other

## 2016-11-04 DIAGNOSIS — M25552 Pain in left hip: Secondary | ICD-10-CM | POA: Diagnosis not present

## 2016-11-04 DIAGNOSIS — R252 Cramp and spasm: Secondary | ICD-10-CM | POA: Diagnosis not present

## 2016-11-04 DIAGNOSIS — R29898 Other symptoms and signs involving the musculoskeletal system: Secondary | ICD-10-CM | POA: Diagnosis not present

## 2016-11-04 DIAGNOSIS — M6281 Muscle weakness (generalized): Secondary | ICD-10-CM

## 2016-11-04 DIAGNOSIS — M25551 Pain in right hip: Secondary | ICD-10-CM | POA: Diagnosis not present

## 2016-11-04 NOTE — Therapy (Signed)
[place], [place], [address] Phone: [phone]   Fax:  [phone]  Physical Therapy Treatment  Patient Details  Name: [name] MRN: [id number] Date of Birth: 1945/07/03 Referring Provider: Mcarthur Rossetti   Encounter Date: 11/04/2016      PT End of Session - 11/04/16 1541    Visit Number 5   Number of Visits 13   Date for PT Re-Evaluation 11/11/16   Authorization Type Medicare/[employer]    Authorization Time Period 10/21/16 to 12/02/16   Authorization - Visit Number 5   Authorization - Number of Visits 10   PT Start Time 6659   PT Stop Time 1612   PT Time Calculation (min) 38 min   Activity Tolerance Patient tolerated treatment well;No increased pain   Behavior During Therapy WFL for tasks assessed/performed      Past Medical History:  Diagnosis Date  . DM (diabetes mellitus) ([place]) 2013   insulin  . Eustachian tube dysfunction   . GERD (gastroesophageal reflux disease)   . Hyperlipidemia   . Hypertension   . Hypothyroid   . Lung nodule    CT abd by [facility] 09/29/2009  . OSA (obstructive sleep apnea)   . Osteopenia     Past Surgical History:  Procedure Laterality Date  . ANKLE SURGERY    . Anteriorvesicourethropexy    . BUNIONECTOMY    . COLONOSCOPY    . NASAL SINUS SURGERY    . PATELLA FRACTURE SURGERY  2009   Right  . TONSILLECTOMY    . TOTAL ABDOMINAL HYSTERECTOMY    . WRIST FRACTURE SURGERY  2009    There were no vitals filed for this visit.      Subjective Assessment - 11/04/16 1537    Subjective Pt reports she is feeling good today, only pain she has with movements from sit to stand.  Reports benefits following massage, has began using massage balls and compliance with HEP.   Pertinent History HTN, fibromylagia, DM, history of R ankle surgery and R patella fracture    Patient Stated Goals reduce pain   Currently in Pain? No/denies                          [facility] Adult PT Treatment/Exercise - 11/04/16 0001      Knee/Hip Exercises: Stretches   Hip Flexor Stretch 3 reps;30 seconds   Hip Flexor Stretch Limitations standing 12in step Bil LE     Knee/Hip Exercises: Standing   Wall Squat 10 reps;5 seconds   Wall Squat Limitations Blue theraband around knees   Other Standing Knee Exercises Sidestepping Lt/Rt over large hurdle x10 reps      Manual Therapy   Manual Therapy Myofascial release   Manual therapy comments separate rest of session    Myofascial Release Trigger point release Lt iliopsas, manual and instrument assisted soft tissue massage to Bil glut med/ TFL'                  PT Short Term Goals - 10/21/16 1144      PT SHORT TERM GOAL #1   Title Patient to experience pain no more than 5/10 in B hips in order to improve tolerance to weightbearing activities and tasks    Time 3   Period Weeks   Status New     PT SHORT TERM GOAL #2   Title Patient to demonstrate improvement in  bilateral hip IR/ER of at least 50% in order to improve mechanics and reduce pain    Time 3   Period Weeks   Status New     PT SHORT TERM GOAL #3   Title Patient to demonstrate B Piriformis muscles as being no more than moderately limited in order to assist in pain reduction    Time 3   Period Weeks   Status New     PT SHORT TERM GOAL #4   Title Patient to be compliant with correct performance of HEP, to be updated PRN    Time 3   Period Weeks   Status New           PT Long Term Goals - 10/21/16 1145      PT LONG TERM GOAL #1   Title Patient to experience pain in B hips as being no more than 2/10 in order to improve QOL and tolerance to weightbearing tasks    Time 6   Period Weeks   Status New     PT LONG TERM GOAL #2   Title Patient to demosntrate no TTP with palpation in order to show improvement of condition and reduced irritabilty of tissues    Time 6   Period Weeks   Status New      PT LONG TERM GOAL #3   Title Patient to demonstrate functional strength as having improved by at least 1 MMT grade in all weak groups in order to reduce pain and improve tolerance to activity    Time 6   Period Weeks   Status New     PT LONG TERM GOAL #4   Title Patient to report she has been able to go shopping and perform all functional tasks in her home without pain exacerbation in order to improve QOL    Time 6   Period Weeks   Status New               Plan - 11/04/16 1542    Clinical Impression Statement Pt arrived late for tx, unable to complete full POC.  Session focus on proximal strengthening primarly for gluteal musculature, added hip flexor stretch to address psoas/rectus femoris tightness.  Pt able to demonstrate appropriate form and technique with all therex with no reports of pain through session.  EOS with manual soft tissue mobilitzation and trigger point release technqiues to address tightness hip musculature.     Rehab Potential Good   Clinical Impairments Affecting Rehab Potential (+) motivated to participate, success with PT in the past; (-) fibromyalgia, chronicity of pain    PT Frequency 2x / week   PT Duration 6 weeks   PT Treatment/Interventions ADLs/Self Care Home Management;Cryotherapy;Moist Heat;DME Instruction;Gait training;Stair training;Functional mobility training;Therapeutic activities;Therapeutic exercise;Balance training;Neuromuscular re-education;Patient/family education;Manual techniques;Passive range of motion;Dry needling   PT Next Visit Plan hip extensor stretch (active and passive), sit to stand or wall squats with band around knees; STM along glutes/TFL as needed   PT Home Exercise Plan Eval: hip flexor stretch, 3D lumbar excursions, SKTC, self glute massage       Patient will benefit from skilled therapeutic intervention in order to improve the following deficits and impairments:  Abnormal gait, Improper body mechanics, Pain, Decreased  mobility, Increased muscle spasms, Postural dysfunction, Decreased activity tolerance, Decreased range of motion, Decreased strength, Hypomobility, Decreased balance, Difficulty walking, Impaired flexibility  Visit Diagnosis: Pain in left hip  Pain in right hip  Muscle weakness (generalized)  Cramp and spasm  Other symptoms and signs involving the musculoskeletal system     Problem List Patient Active Problem List   Diagnosis Date Noted  . Pain in left hip 10/05/2016  . Pain in right hip 10/05/2016  . Trochanteric bursitis, right hip 10/05/2016  . Trochanteric bursitis, left hip 10/05/2016  . Chronic insomnia 01/28/2011  . Seasonal and perennial allergic rhinitis 05/28/2010  . Lung nodule 05/28/2010  . HYPERLIPIDEMIA 11/23/2009  . G E R D 11/23/2009  . EUSTACHIAN TUBE DYSFUNCTION 11/18/2009  . HYPERTENSION 11/18/2009  . Unspecified hypothyroidism 03/14/2007  . Obstructive sleep apnea 03/14/2007  . FIBROMYALGIA 03/14/2007   Ihor Austin, [place]; [place]  Aldona Lento 11/04/2016, 4:23 PM  [facility] [address] [place], [place], [address] Phone: [phone]   Fax:  [phone]  Name: [name] MRN: [id number] Date of Birth: Jun 04, 1945

## 2016-11-09 ENCOUNTER — Ambulatory Visit (HOSPITAL_COMMUNITY): Payer: Medicare Other | Admitting: Physical Therapy

## 2016-11-09 DIAGNOSIS — R29898 Other symptoms and signs involving the musculoskeletal system: Secondary | ICD-10-CM | POA: Diagnosis not present

## 2016-11-09 DIAGNOSIS — M25552 Pain in left hip: Secondary | ICD-10-CM

## 2016-11-09 DIAGNOSIS — M25551 Pain in right hip: Secondary | ICD-10-CM | POA: Diagnosis not present

## 2016-11-09 DIAGNOSIS — R252 Cramp and spasm: Secondary | ICD-10-CM | POA: Diagnosis not present

## 2016-11-09 DIAGNOSIS — M6281 Muscle weakness (generalized): Secondary | ICD-10-CM | POA: Diagnosis not present

## 2016-11-09 NOTE — Therapy (Signed)
[place], [place], [address] Phone: [phone]   Fax:  [phone]  Physical Therapy Treatment (Re-Assessment)  Patient Details  Name: [name] MRN: [id number] Date of Birth: 1945/12/16 Referring Provider: Mcarthur Rossetti   Encounter Date: 11/09/2016      PT End of Session - 11/09/16 1210    Visit Number 6   Number of Visits 13   Date for PT Re-Evaluation 11/30/16   Authorization Type Medicare/[employer]  (G-codes done 6th session)   Authorization Time Period 10/21/16 to 12/02/16   Authorization - Visit Number 6   Authorization - Number of Visits 16   PT Start Time 0945   PT Stop Time 1024   PT Time Calculation (min) 39 min   Activity Tolerance Patient tolerated treatment well   Behavior During Therapy [facility] for tasks assessed/performed      Past Medical History:  Diagnosis Date  . DM (diabetes mellitus) ([place]) 2013   insulin  . Eustachian tube dysfunction   . GERD (gastroesophageal reflux disease)   . Hyperlipidemia   . Hypertension   . Hypothyroid   . Lung nodule    CT abd by [facility] 09/29/2009  . OSA (obstructive sleep apnea)   . Osteopenia     Past Surgical History:  Procedure Laterality Date  . ANKLE SURGERY    . Anteriorvesicourethropexy    . BUNIONECTOMY    . COLONOSCOPY    . NASAL SINUS SURGERY    . PATELLA FRACTURE SURGERY  2009   Right  . TONSILLECTOMY    . TOTAL ABDOMINAL HYSTERECTOMY    . WRIST FRACTURE SURGERY  2009    There were no vitals filed for this visit.      Subjective Assessment - 11/09/16 0947    Subjective Patient states that she has good days and bad days; what seems to help the most is the deep massage she has been receiving. Getting out of the car and trying to walk continues to hurt, it takes her awhile to get going. Her pain remains around a 3/10 in intensity, but she feels sometimes she can move easier than before. Her biggest  concern is that she continues to have pain when attempting to do functional activities.  She rates herself as being an 85/100 on a subjective scale.    Pertinent History HTN, fibromylagia, DM, history of R ankle surgery and R patella fracture    How long can you sit comfortably? 8/21- sitting is OK, starting to move is painful    How long can you stand comfortably? 8/21- 20-30 minutes    How long can you walk comfortably? 8/21- half the grocery store, maybe 20 minutes, still needs to lean on cart    Diagnostic tests xray shows no signficant OA or osteophytes in B hips    Patient Stated Goals reduce pain   Currently in Pain? Yes   Pain Score 3    Pain Location Hip   Pain Orientation Right;Left   Pain Descriptors / Indicators Aching;Constant   Pain Type Chronic pain   Pain Radiating Towards none    Pain Onset More than a month ago   Pain Frequency Constant   Aggravating Factors  getting up after being still for too long    Pain Relieving Factors massage    Effect of Pain on Daily Activities severe             [facility] PT Assessment -  11/09/16 0001      AROM   Right Hip External Rotation  41   Right Hip Internal Rotation  28   Left Hip External Rotation  42   Left Hip Internal Rotation  29   Lumbar Flexion full ROM, very mild pain L hip    Lumbar Extension minimal limitation   Lumbar - Right Side Bend mild limitation    Lumbar - Left Side Bend moderate limitation      Strength   Right Hip Flexion 4+/5   Right Hip ABduction 5/5   Left Hip Flexion 4+/5   Left Hip ABduction 4+/5   Right Knee Flexion 5/5   Right Knee Extension 5/5   Left Knee Flexion 5/5   Left Knee Extension 5/5   Right Ankle Dorsiflexion 5/5   Left Ankle Dorsiflexion 5/5     Flexibility   Hamstrings WNL B    Piriformis moderate limitation B      Palpation   Palpation comment reduced tenderness noted B glutes and ITB/TFL regions today                      [facility] Adult PT Treatment/Exercise  - 11/09/16 0001      Knee/Hip Exercises: Stretches   Other Knee/Hip Stretches SKTC 5x10 seconds B; lumbar rotations 5x10 each side      Manual Therapy   Manual Therapy Soft tissue mobilization   Manual therapy comments separate rest of session    Soft tissue mobilization STM primarily L gluteal and TFL/ITB region                 PT Education - 11/09/16 1209    Education provided Yes   Education Details progrss with skilled PT services, POC moving forward, goal review    Person(s) Educated Patient   Methods Explanation   Comprehension Verbalized understanding          PT Short Term Goals - 11/09/16 1002      PT SHORT TERM GOAL #1   Title Patient to experience pain no more than 5/10 in B hips in order to improve tolerance to weightbearing activities and tasks    Baseline 8/21-- 3/10 right now, can get to 6/10   Time 3   Period Weeks   Status On-going     PT SHORT TERM GOAL #2   Title Patient to demonstrate improvement in bilateral hip IR/ER of at least 50% in order to improve mechanics and reduce pain    Baseline 8/21- improving    Time 3   Period Weeks   Status Partially Met     PT SHORT TERM GOAL #3   Title Patient to demonstrate B Piriformis muscles as being no more than minimally  limited in order to assist in pain reduction    Baseline 8/21- moderate limitation; goal revised today    Time 3   Period Weeks   Status Revised     PT SHORT TERM GOAL #4   Title Patient to be compliant with correct performance of HEP, to be updated PRN    Baseline 8/21- compliant    Time 3   Period Weeks   Status Achieved           PT Long Term Goals - 11/09/16 1005      PT LONG TERM GOAL #1   Title Patient to experience pain in B hips as being no more than 2/10 in order to improve QOL and tolerance to weightbearing  tasks    Baseline November 28, 2022- 3-6/10    Time 6   Period Weeks   Status On-going     PT LONG TERM GOAL #2   Title Patient to demosntrate no TTP with  palpation in order to show improvement of condition and reduced irritabilty of tissues    Baseline Nov 28, 2022- improved but has ongoing difficulty laying on either side    Time 6   Period Weeks   Status Partially Met     PT LONG TERM GOAL #3   Title Patient to demonstrate functional strength as having improved by at least 1 MMT grade in all weak groups in order to reduce pain and improve tolerance to activity    Baseline 11-28-22- improving    Time 6   Period Weeks   Status On-going     PT LONG TERM GOAL #4   Title Patient to report she has been able to go shopping and perform all functional tasks in her home without pain exacerbation in order to improve QOL    Baseline 11/28/22- conitnues to have pain    Time 6   Period Weeks   Status On-going               Plan - 11/27/2016 1210    Clinical Impression Statement Re-assessment performed today. Patient shows objective improvement towards measures taken today as well as some progress towards her goals. She does, however, continue to be limited by bilateral hip pain with functional tasks and also expresses a fear of falling due to hip pain; she also continues to demonstrate impaired flexibility as well as impaired muscle strength and lumbar/hip ROM. Recommend skilled PT services to continue addressing functional deficits and to reduce pain moving forward.    Rehab Potential Good   Clinical Impairments Affecting Rehab Potential (+) motivated to participate, success with PT in the past; (-) fibromyalgia, chronicity of pain    PT Frequency 2x / week   PT Duration 3 weeks   PT Treatment/Interventions ADLs/Self Care Home Management;Cryotherapy;Moist Heat;DME Instruction;Gait training;Stair training;Functional mobility training;Therapeutic activities;Therapeutic exercise;Balance training;Neuromuscular re-education;Patient/family education;Manual techniques;Passive range of motion;Dry needling   PT Next Visit Plan hip extensor stretch (active and passive),  sit to stand or wall squats with band around knees; STM along glutes/TFL as needed. Update HEP. Lumbar rotation stretch.    PT Home Exercise Plan Eval: hip flexor stretch, 3D lumbar excursions, SKTC, self glute massage    Consulted and Agree with Plan of Care Patient      Patient will benefit from skilled therapeutic intervention in order to improve the following deficits and impairments:  Abnormal gait, Improper body mechanics, Pain, Decreased mobility, Increased muscle spasms, Postural dysfunction, Decreased activity tolerance, Decreased range of motion, Decreased strength, Hypomobility, Decreased balance, Difficulty walking, Impaired flexibility  Visit Diagnosis: Pain in left hip  Pain in right hip  Muscle weakness (generalized)  Cramp and spasm  Other symptoms and signs involving the musculoskeletal system       G-Codes - 2016-11-27 1211    Functional Assessment Tool Used (Outpatient Only) Baesd on skilled clinical assessment of strength, flexibility, ROM, pain patterns    Functional Limitation Mobility: Walking and moving around   Mobility: Walking and Moving Around Current Status [phone]) At least 40 percent but less than 60 percent impaired, limited or restricted   Mobility: Walking and Moving Around Goal Status [phone]) At least 20 percent but less than 40 percent impaired, limited or restricted      Problem List Patient  Active Problem List   Diagnosis Date Noted  . Pain in left hip 10/05/2016  . Pain in right hip 10/05/2016  . Trochanteric bursitis, right hip 10/05/2016  . Trochanteric bursitis, left hip 10/05/2016  . Chronic insomnia 01/28/2011  . Seasonal and perennial allergic rhinitis 05/28/2010  . Lung nodule 05/28/2010  . HYPERLIPIDEMIA 11/23/2009  . G E R D 11/23/2009  . EUSTACHIAN TUBE DYSFUNCTION 11/18/2009  . HYPERTENSION 11/18/2009  . Unspecified hypothyroidism 03/14/2007  . Obstructive sleep apnea 03/14/2007  . FIBROMYALGIA 03/14/2007    Deniece Ree  PT, DPT [facility] [address] [place], [place], [address] Phone: [phone]   Fax:  [phone]  Name: [name] MRN: [id number] Date of Birth: March 22, 1946

## 2016-11-11 ENCOUNTER — Ambulatory Visit (HOSPITAL_COMMUNITY): Payer: Medicare Other | Admitting: Physical Therapy

## 2016-11-11 DIAGNOSIS — R29898 Other symptoms and signs involving the musculoskeletal system: Secondary | ICD-10-CM

## 2016-11-11 DIAGNOSIS — M25552 Pain in left hip: Secondary | ICD-10-CM | POA: Diagnosis not present

## 2016-11-11 DIAGNOSIS — M6281 Muscle weakness (generalized): Secondary | ICD-10-CM | POA: Diagnosis not present

## 2016-11-11 DIAGNOSIS — R252 Cramp and spasm: Secondary | ICD-10-CM

## 2016-11-11 DIAGNOSIS — M25551 Pain in right hip: Secondary | ICD-10-CM | POA: Diagnosis not present

## 2016-11-11 NOTE — Therapy (Signed)
[place], [place], [address] Phone: [phone]   Fax:  [phone]  Physical Therapy Treatment  Patient Details  Name: [name] MRN: [id number] Date of Birth: Feb 21, 1946 Referring Provider: Mcarthur Rossetti   Encounter Date: 11/11/2016      PT End of Session - 11/11/16 1030    Visit Number 7   Number of Visits 13   Date for PT Re-Evaluation 11/30/16   Authorization Type Medicare/[employer]  (G-codes done 6th session)   Authorization Time Period 10/21/16 to 12/02/16   Authorization - Visit Number 7   Authorization - Number of Visits 16   PT Start Time 0946   PT Stop Time 7989   PT Time Calculation (min) 42 min   Activity Tolerance Patient tolerated treatment well;No increased pain   Behavior During Therapy WFL for tasks assessed/performed      Past Medical History:  Diagnosis Date  . DM (diabetes mellitus) ([place]) 2013   insulin  . Eustachian tube dysfunction   . GERD (gastroesophageal reflux disease)   . Hyperlipidemia   . Hypertension   . Hypothyroid   . Lung nodule    CT abd by [facility] 09/29/2009  . OSA (obstructive sleep apnea)   . Osteopenia     Past Surgical History:  Procedure Laterality Date  . ANKLE SURGERY    . Anteriorvesicourethropexy    . BUNIONECTOMY    . COLONOSCOPY    . NASAL SINUS SURGERY    . PATELLA FRACTURE SURGERY  2009   Right  . TONSILLECTOMY    . TOTAL ABDOMINAL HYSTERECTOMY    . WRIST FRACTURE SURGERY  2009    There were no vitals filed for this visit.      Subjective Assessment - 11/11/16 0950    Subjective Pt reports that she started Tai Chi yesterday which she really enjoyed. She says that she woke up with some achiness and a headache which she attributes to her fibromyalgia.    Pertinent History HTN, fibromylagia, DM, history of R ankle surgery and R patella fracture    How long can you sit comfortably? 8/21- sitting is OK,  starting to move is painful    How long can you stand comfortably? 8/21- 20-30 minutes    How long can you walk comfortably? 8/21- half the grocery store, maybe 20 minutes, still needs to lean on cart    Diagnostic tests xray shows no signficant OA or osteophytes in B hips    Patient Stated Goals reduce pain   Currently in Pain? No/denies   Pain Onset More than a month ago                         [facility] Adult PT Treatment/Exercise - 11/11/16 0001      Knee/Hip Exercises: Stretches   Piriformis Stretch 1 rep;Both;30 seconds   Piriformis Stretch Limitations seated    Other Knee/Hip Stretches Lt LE cross over stretch for glute med/TFL 3x30 sec with therapist over pressure    Other Knee/Hip Stretches Seated glute max stretch 1x30 sec each      Knee/Hip Exercises: Seated   Other Seated Knee/Hip Exercises long sitting hip abduction x15 reps without resistance, x15 reps with yellow TB      Knee/Hip Exercises: Supine   Other Supine Knee/Hip Exercises supine hip flexor eccentric lower with double yellow TB in thomas position      Manual Therapy  Manual therapy comments separate rest of session    Soft tissue mobilization STM proximal Lt vastus lateralis Lt glute med    Myofascial Release Trigger point release Lt TFL/glute med                PT Education - 11/11/16 1027    Education provided Yes   Education Details updated and reviewed HEP   Person(s) Educated Patient   Methods Explanation;Verbal cues;Handout   Comprehension Verbalized understanding;Returned demonstration          PT Short Term Goals - 11/09/16 1002      PT SHORT TERM GOAL #1   Title Patient to experience pain no more than 5/10 in B hips in order to improve tolerance to weightbearing activities and tasks    Baseline 8/21-- 3/10 right now, can get to 6/10   Time 3   Period Weeks   Status On-going     PT SHORT TERM GOAL #2   Title Patient to demonstrate improvement in bilateral hip  IR/ER of at least 50% in order to improve mechanics and reduce pain    Baseline 8/21- improving    Time 3   Period Weeks   Status Partially Met     PT SHORT TERM GOAL #3   Title Patient to demonstrate B Piriformis muscles as being no more than minimally  limited in order to assist in pain reduction    Baseline 8/21- moderate limitation; goal revised today    Time 3   Period Weeks   Status Revised     PT SHORT TERM GOAL #4   Title Patient to be compliant with correct performance of HEP, to be updated PRN    Baseline 8/21- compliant    Time 3   Period Weeks   Status Achieved           PT Long Term Goals - 11/09/16 1005      PT LONG TERM GOAL #1   Title Patient to experience pain in B hips as being no more than 2/10 in order to improve QOL and tolerance to weightbearing tasks    Baseline 8/21- 3-6/10    Time 6   Period Weeks   Status On-going     PT LONG TERM GOAL #2   Title Patient to demosntrate no TTP with palpation in order to show improvement of condition and reduced irritabilty of tissues    Baseline 8/21- improved but has ongoing difficulty laying on either side    Time 6   Period Weeks   Status Partially Met     PT LONG TERM GOAL #3   Title Patient to demonstrate functional strength as having improved by at least 1 MMT grade in all weak groups in order to reduce pain and improve tolerance to activity    Baseline 8/21- improving    Time 6   Period Weeks   Status On-going     PT LONG TERM GOAL #4   Title Patient to report she has been able to go shopping and perform all functional tasks in her home without pain exacerbation in order to improve QOL    Baseline 8/21- conitnues to have pain    Time 6   Period Weeks   Status On-going               Plan - 11/11/16 1030    Clinical Impression Statement Continued with manual techniques and therex to address pt restrictions throughout the proximal hip. Pt reporting continued  improvements in muscle tightness  and pain following manual treatment. Therapist reviewed and updated pt's HEP to improve stretch outcomes at home, and she demonstrated proper technique with this. Ended session without report of pain.   Rehab Potential Good   Clinical Impairments Affecting Rehab Potential (+) motivated to participate, success with PT in the past; (-) fibromyalgia, chronicity of pain    PT Frequency 2x / week   PT Duration 3 weeks   PT Treatment/Interventions ADLs/Self Care Home Management;Cryotherapy;Moist Heat;DME Instruction;Gait training;Stair training;Functional mobility training;Therapeutic activities;Therapeutic exercise;Balance training;Neuromuscular re-education;Patient/family education;Manual techniques;Passive range of motion;Dry needling   PT Next Visit Plan sit to stand or wall squats with band around knees; STM and myofascial release along glutes/TFL as needed.    PT Home Exercise Plan Eval: hip flexor stretch, 3D lumbar excursions, SKTC, self glute massage, seated piriformis stretch (knee pull to shoulder), long sitting hip abduction with red TB    Consulted and Agree with Plan of Care Patient      Patient will benefit from skilled therapeutic intervention in order to improve the following deficits and impairments:  Abnormal gait, Improper body mechanics, Pain, Decreased mobility, Increased muscle spasms, Postural dysfunction, Decreased activity tolerance, Decreased range of motion, Decreased strength, Hypomobility, Decreased balance, Difficulty walking, Impaired flexibility  Visit Diagnosis: Pain in left hip  Pain in right hip  Muscle weakness (generalized)  Cramp and spasm  Other symptoms and signs involving the musculoskeletal system     Problem List Patient Active Problem List   Diagnosis Date Noted  . Pain in left hip 10/05/2016  . Pain in right hip 10/05/2016  . Trochanteric bursitis, right hip 10/05/2016  . Trochanteric bursitis, left hip 10/05/2016  . Chronic insomnia  01/28/2011  . Seasonal and perennial allergic rhinitis 05/28/2010  . Lung nodule 05/28/2010  . HYPERLIPIDEMIA 11/23/2009  . G E R D 11/23/2009  . EUSTACHIAN TUBE DYSFUNCTION 11/18/2009  . HYPERTENSION 11/18/2009  . Unspecified hypothyroidism 03/14/2007  . Obstructive sleep apnea 03/14/2007  . FIBROMYALGIA 03/14/2007   10:37 AM,11/11/16 Elly Modena PT, DPT [facility] [place] [address] [place], [place], [address] Phone: [phone]   Fax:  [phone]  Name: [name] MRN: [id number] Date of Birth: 1945/09/11

## 2016-11-15 ENCOUNTER — Ambulatory Visit (INDEPENDENT_AMBULATORY_CARE_PROVIDER_SITE_OTHER): Payer: Medicare Other | Admitting: Orthopaedic Surgery

## 2016-11-15 ENCOUNTER — Encounter (INDEPENDENT_AMBULATORY_CARE_PROVIDER_SITE_OTHER): Payer: Self-pay | Admitting: Orthopaedic Surgery

## 2016-11-15 ENCOUNTER — Telehealth (HOSPITAL_COMMUNITY): Payer: Self-pay | Admitting: Physical Therapy

## 2016-11-15 VITALS — Ht 63.5 in | Wt 205.0 lb

## 2016-11-15 DIAGNOSIS — M7062 Trochanteric bursitis, left hip: Secondary | ICD-10-CM

## 2016-11-15 DIAGNOSIS — M7061 Trochanteric bursitis, right hip: Secondary | ICD-10-CM

## 2016-11-15 NOTE — Telephone Encounter (Signed)
Pt saw MD today and will take a diffrenet route and requested to be D/C today. NF 11/15/16

## 2016-11-15 NOTE — Progress Notes (Signed)
The patient is following up with bilateral hip trochanteric bursitis. She is completing physical therapy and still has pain in her hips but she says more tolerable. She is very active 71 years old. She is going to start tai chi as well as consider water aerobics.  On exam she has fluid range of motion actively and passively of both hips with no pain in the groin at all. Her pain is more left than the right but its over the trochanteric area and IT band of both hips.  At this point she still defers any type of steroid injection. I suggested continued activity modification and not lying on her side at night. She also understands that she can try any type of over-the-counter topical anti-inflammatory and a weight loss will help. All questions were encouraged and answered. She'll follow-up as needed and can stop outpatient physical therapy at this standpoint since she is using a home exercise program now.

## 2016-11-16 ENCOUNTER — Ambulatory Visit (HOSPITAL_COMMUNITY): Payer: Medicare Other | Admitting: Physical Therapy

## 2016-11-18 ENCOUNTER — Ambulatory Visit (HOSPITAL_COMMUNITY): Payer: Medicare Other | Admitting: Physical Therapy

## 2016-11-19 DIAGNOSIS — J189 Pneumonia, unspecified organism: Secondary | ICD-10-CM | POA: Diagnosis not present

## 2016-11-19 DIAGNOSIS — E119 Type 2 diabetes mellitus without complications: Secondary | ICD-10-CM | POA: Diagnosis not present

## 2016-11-19 DIAGNOSIS — R05 Cough: Secondary | ICD-10-CM | POA: Diagnosis not present

## 2016-11-19 DIAGNOSIS — Z6835 Body mass index (BMI) 35.0-35.9, adult: Secondary | ICD-10-CM | POA: Diagnosis not present

## 2016-11-23 ENCOUNTER — Ambulatory Visit (HOSPITAL_COMMUNITY): Payer: Medicare Other | Admitting: Physical Therapy

## 2016-11-23 ENCOUNTER — Encounter (HOSPITAL_COMMUNITY): Payer: Self-pay

## 2016-11-25 ENCOUNTER — Telehealth: Payer: Self-pay | Admitting: Internal Medicine

## 2016-11-25 ENCOUNTER — Encounter (HOSPITAL_COMMUNITY): Payer: Medicare Other | Admitting: Physical Therapy

## 2016-11-25 NOTE — Telephone Encounter (Signed)
Called and spoke with pt and she is aware of appt with CY on 9/10 at 230

## 2016-11-29 ENCOUNTER — Ambulatory Visit (INDEPENDENT_AMBULATORY_CARE_PROVIDER_SITE_OTHER)
Admission: RE | Admit: 2016-11-29 | Discharge: 2016-11-29 | Disposition: A | Discharge status: Home / Self Care | Payer: Medicare Other | Source: Ambulatory Visit | Attending: Internal Medicine | Admitting: Internal Medicine

## 2016-11-29 ENCOUNTER — Encounter: Payer: Self-pay | Admitting: Internal Medicine

## 2016-11-29 ENCOUNTER — Ambulatory Visit (INDEPENDENT_AMBULATORY_CARE_PROVIDER_SITE_OTHER): Payer: Medicare Other | Admitting: Internal Medicine

## 2016-11-29 VITALS — BP 114/74 | HR 88 | Ht 63.5 in | Wt 204.6 lb

## 2016-11-29 DIAGNOSIS — R918 Other nonspecific abnormal finding of lung field: Secondary | ICD-10-CM | POA: Diagnosis not present

## 2016-11-29 DIAGNOSIS — G4733 Obstructive sleep apnea (adult) (pediatric): Secondary | ICD-10-CM | POA: Diagnosis not present

## 2016-11-29 DIAGNOSIS — R911 Solitary pulmonary nodule: Secondary | ICD-10-CM | POA: Diagnosis not present

## 2016-11-29 NOTE — Progress Notes (Signed)
Patient ID: [name], female    DOB: [birth year], 71 y.o.   MRN: [id number]  HPI F followed for sleep apnea, allergic rhinitis, hx of lung nodule. NPSG 03/05/07- AHI 31.7/ hr, desaturation to 85%, CPAP to 14, body weight 195 lbs  --------------------------------------------------------------------------------- . 8/7//3916-71 year old female never smoker followed for OSA/CPAP, allergic rhinitis, prior lung nodule, complicated by DM 2 FOLLOWS FOR: DME:[facility] Pt continues to wear CPAP nightly; will need to order DL as patient does not have SD card with her and not in [place]. CPAP 10/[facility] Doing very well with CPAP and reports significantly improved quality of life. We will need to order download for documentation. Reports 1 day of increasing postnasal drainage involving right ear and throat with some increased mucus in her eyes but no fever, sore throat, adenopathy or sick exposure. Not itching or sneezing.  11/29/16- 71 year old female never smoker followed for OSA/CPAP, allergic rhinitis, prior lung nodule, complicated by  DM 2, GERD, HBP, hypothyroid CPAP 10/[facility]> new machine auto 8-15 DME: [facility].  Pt states that her CPAP has been working well for her. States that sometimes machine does sound louder than it should. Not sure how old this machine is. Download 99% compliance averaging 7-1/2 hours per night with AHI 2.1/hour We discussed changing to AutoPap machine if she is eligible for replacement We also discussed remote history of lung nodule. She has had some dry morning cough, no adenopathy, night sweats or fever. Gets flu vaccine at her PCP   Review of Systems-See HPI  + = pos Constitutional:   No-   weight loss, , fevers, chills, fatigue, lassitude. HEENT:   Mild  headaches, No-difficulty swallowing, tooth/dental problems, sore throat,       No-  sneezing, itching, ear ache,   +nasal congestion, +post nasal drip- improved,  CV:  No-   chest pain, orthopnea, PND,  swelling in lower extremities, anasarca, dizziness, palpitations Resp: No-   shortness of breath with exertion or at rest.              No-   productive cough,  No non-productive cough,  No-  coughing up of blood.              No-   change in color of mucus.  No- wheezing.   Skin: No-   rash or lesions. GI:  No-   heartburn, indigestion, abdominal pain, nausea, vomiting,  GU: . MS:  No-   joint pain or swelling.  Neuro- nothing unusual:  Psych:  No- change in mood or affect. No depression or anxiety.  No memory loss.  Objective:   Physical Exam General- Alert, Oriented, Affect-appropriate, Distress- none acute, +overweight Skin- rash-none, lesions- none, excoriation- none Lymphadenopathy- none Head- atraumatic            Eyes- Gross vision intact, PERRLA, conjunctivae clear secretions            Ears- Hearing, canals-normal            Nose- turbinate edema, no-Septal dev, mucus, polyps, erosion, perforation             Throat- Mallampati III , mucosa Dry, drainage- none, tonsils- atrophic Neck- flexible , trachea midline, no stridor , thyroid nl, carotid no bruit Chest - symmetrical excursion , unlabored           Heart/CV- RRR ,  +Murmur 1/6 systolic AS , no gallop  , no rub, nl s1 s2                           -  JVD- none , edema- none, stasis changes- none, varices- none           Lung- clear to P&A, wheeze- none, cough- none , dullness-none, rub- none           Chest wall-  Abd-  Br/ Gen/ Rectal- Not done, not indicated Extrem- cyanosis- none, clubbing, none, atrophy- none, strength- nl Neuro- grossly intact to observation     

## 2016-11-29 NOTE — Assessment & Plan Note (Signed)
She has incidental dry morning cough but we both wanted update on lung nodules noted in the past. Plan-CXR

## 2016-11-29 NOTE — Assessment & Plan Note (Signed)
Good compliance and control at fixed pressure of 10. Her machine is old. She wishes she had a smaller machine for easier travel. Plan-if eligible, update CPAP machine, change to auto 8-15

## 2016-11-29 NOTE — Patient Instructions (Signed)
Order- DME [facility]- If eligible please replace old CPAP machine, mask of choice, change to auto 8-15, humidifier, supplies, AirView Dx OSA                                    Order CXR- dx Lung nodules  Please call as needed

## 2016-11-30 ENCOUNTER — Encounter (HOSPITAL_COMMUNITY): Payer: Medicare Other | Admitting: Physical Therapy

## 2016-12-02 ENCOUNTER — Encounter (HOSPITAL_COMMUNITY): Payer: Medicare Other | Admitting: Physical Therapy

## 2016-12-02 DIAGNOSIS — E119 Type 2 diabetes mellitus without complications: Secondary | ICD-10-CM | POA: Diagnosis not present

## 2016-12-02 DIAGNOSIS — H2513 Age-related nuclear cataract, bilateral: Secondary | ICD-10-CM | POA: Diagnosis not present

## 2016-12-13 DIAGNOSIS — N3946 Mixed incontinence: Secondary | ICD-10-CM | POA: Diagnosis not present

## 2016-12-13 DIAGNOSIS — N952 Postmenopausal atrophic vaginitis: Secondary | ICD-10-CM | POA: Diagnosis not present

## 2016-12-13 DIAGNOSIS — B372 Candidiasis of skin and nail: Secondary | ICD-10-CM | POA: Diagnosis not present

## 2016-12-13 DIAGNOSIS — N393 Stress incontinence (female) (male): Secondary | ICD-10-CM | POA: Diagnosis not present

## 2016-12-13 DIAGNOSIS — M62838 Other muscle spasm: Secondary | ICD-10-CM | POA: Diagnosis not present

## 2016-12-21 DIAGNOSIS — Z23 Encounter for immunization: Secondary | ICD-10-CM | POA: Diagnosis not present

## 2016-12-21 DIAGNOSIS — I10 Essential (primary) hypertension: Secondary | ICD-10-CM | POA: Diagnosis not present

## 2016-12-21 DIAGNOSIS — Z794 Long term (current) use of insulin: Secondary | ICD-10-CM | POA: Diagnosis not present

## 2016-12-21 DIAGNOSIS — E119 Type 2 diabetes mellitus without complications: Secondary | ICD-10-CM | POA: Diagnosis not present

## 2016-12-21 DIAGNOSIS — Z6835 Body mass index (BMI) 35.0-35.9, adult: Secondary | ICD-10-CM | POA: Diagnosis not present

## 2017-01-10 DIAGNOSIS — N3946 Mixed incontinence: Secondary | ICD-10-CM | POA: Diagnosis not present

## 2017-01-10 DIAGNOSIS — M62838 Other muscle spasm: Secondary | ICD-10-CM | POA: Diagnosis not present

## 2017-01-12 DIAGNOSIS — E7849 Other hyperlipidemia: Secondary | ICD-10-CM | POA: Diagnosis not present

## 2017-01-12 DIAGNOSIS — I10 Essential (primary) hypertension: Secondary | ICD-10-CM | POA: Diagnosis not present

## 2017-01-12 DIAGNOSIS — E119 Type 2 diabetes mellitus without complications: Secondary | ICD-10-CM | POA: Diagnosis not present

## 2017-01-12 DIAGNOSIS — Z0000 Encounter for general adult medical examination without abnormal findings: Secondary | ICD-10-CM | POA: Diagnosis not present

## 2017-01-12 DIAGNOSIS — E038 Other specified hypothyroidism: Secondary | ICD-10-CM | POA: Diagnosis not present

## 2017-01-19 ENCOUNTER — Other Ambulatory Visit: Payer: Self-pay | Admitting: Internal Medicine

## 2017-01-19 DIAGNOSIS — J984 Other disorders of lung: Secondary | ICD-10-CM | POA: Diagnosis not present

## 2017-01-19 DIAGNOSIS — I10 Essential (primary) hypertension: Secondary | ICD-10-CM | POA: Diagnosis not present

## 2017-01-19 DIAGNOSIS — Z794 Long term (current) use of insulin: Secondary | ICD-10-CM | POA: Diagnosis not present

## 2017-01-19 DIAGNOSIS — G43909 Migraine, unspecified, not intractable, without status migrainosus: Secondary | ICD-10-CM | POA: Diagnosis not present

## 2017-01-19 DIAGNOSIS — Z6834 Body mass index (BMI) 34.0-34.9, adult: Secondary | ICD-10-CM | POA: Diagnosis not present

## 2017-01-19 DIAGNOSIS — Z780 Asymptomatic menopausal state: Secondary | ICD-10-CM | POA: Diagnosis not present

## 2017-01-19 DIAGNOSIS — M797 Fibromyalgia: Secondary | ICD-10-CM | POA: Diagnosis not present

## 2017-01-19 DIAGNOSIS — E038 Other specified hypothyroidism: Secondary | ICD-10-CM | POA: Diagnosis not present

## 2017-01-19 DIAGNOSIS — R1032 Left lower quadrant pain: Secondary | ICD-10-CM

## 2017-01-19 DIAGNOSIS — E7849 Other hyperlipidemia: Secondary | ICD-10-CM | POA: Diagnosis not present

## 2017-01-19 DIAGNOSIS — K760 Fatty (change of) liver, not elsewhere classified: Secondary | ICD-10-CM | POA: Diagnosis not present

## 2017-01-19 DIAGNOSIS — Z0000 Encounter for general adult medical examination without abnormal findings: Secondary | ICD-10-CM | POA: Diagnosis not present

## 2017-01-19 DIAGNOSIS — H9193 Unspecified hearing loss, bilateral: Secondary | ICD-10-CM | POA: Diagnosis not present

## 2017-01-19 DIAGNOSIS — E119 Type 2 diabetes mellitus without complications: Secondary | ICD-10-CM | POA: Diagnosis not present

## 2017-01-24 ENCOUNTER — Ambulatory Visit
Admission: RE | Admit: 2017-01-24 | Discharge: 2017-01-24 | Disposition: A | Discharge status: Home / Self Care | Payer: Medicare Other | Source: Ambulatory Visit | Attending: Internal Medicine | Admitting: Internal Medicine

## 2017-01-24 DIAGNOSIS — R1032 Left lower quadrant pain: Secondary | ICD-10-CM

## 2017-01-24 DIAGNOSIS — K760 Fatty (change of) liver, not elsewhere classified: Secondary | ICD-10-CM | POA: Diagnosis not present

## 2017-01-24 DIAGNOSIS — Z1212 Encounter for screening for malignant neoplasm of rectum: Secondary | ICD-10-CM | POA: Diagnosis not present

## 2017-01-24 MED ORDER — IOPAMIDOL (ISOVUE-300) INJECTION 61%
100.0000 mL | Freq: Once | INTRAVENOUS | Status: AC | PRN
  Administered 2017-01-24: 125 mL via INTRAVENOUS

## 2017-01-28 ENCOUNTER — Other Ambulatory Visit: Payer: Self-pay | Admitting: Internal Medicine

## 2017-01-28 DIAGNOSIS — Z1231 Encounter for screening mammogram for malignant neoplasm of breast: Secondary | ICD-10-CM

## 2017-01-29 DIAGNOSIS — J0100 Acute maxillary sinusitis, unspecified: Secondary | ICD-10-CM | POA: Diagnosis not present

## 2017-02-04 DIAGNOSIS — H903 Sensorineural hearing loss, bilateral: Secondary | ICD-10-CM | POA: Diagnosis not present

## 2017-02-23 DIAGNOSIS — N3946 Mixed incontinence: Secondary | ICD-10-CM | POA: Diagnosis not present

## 2017-02-23 DIAGNOSIS — M62838 Other muscle spasm: Secondary | ICD-10-CM | POA: Diagnosis not present

## 2017-02-23 DIAGNOSIS — B372 Candidiasis of skin and nail: Secondary | ICD-10-CM | POA: Diagnosis not present

## 2017-02-23 DIAGNOSIS — M6281 Muscle weakness (generalized): Secondary | ICD-10-CM | POA: Diagnosis not present

## 2017-02-23 DIAGNOSIS — R61 Generalized hyperhidrosis: Secondary | ICD-10-CM | POA: Diagnosis not present

## 2017-03-02 ENCOUNTER — Ambulatory Visit
Admission: RE | Admit: 2017-03-02 | Discharge: 2017-03-02 | Disposition: A | Discharge status: Home / Self Care | Payer: Medicare Other | Source: Ambulatory Visit | Attending: Internal Medicine | Admitting: Internal Medicine

## 2017-03-02 ENCOUNTER — Other Ambulatory Visit: Payer: Self-pay | Admitting: Internal Medicine

## 2017-03-02 DIAGNOSIS — Z1231 Encounter for screening mammogram for malignant neoplasm of breast: Secondary | ICD-10-CM

## 2017-03-02 DIAGNOSIS — N63 Unspecified lump in unspecified breast: Secondary | ICD-10-CM

## 2017-03-17 ENCOUNTER — Ambulatory Visit
Admission: RE | Admit: 2017-03-17 | Discharge: 2017-03-17 | Disposition: A | Discharge status: Home / Self Care | Payer: Medicare Other | Source: Ambulatory Visit | Attending: Internal Medicine | Admitting: Internal Medicine

## 2017-03-17 DIAGNOSIS — R928 Other abnormal and inconclusive findings on diagnostic imaging of breast: Secondary | ICD-10-CM | POA: Diagnosis not present

## 2017-03-17 DIAGNOSIS — N63 Unspecified lump in unspecified breast: Secondary | ICD-10-CM

## 2017-03-17 DIAGNOSIS — N6489 Other specified disorders of breast: Secondary | ICD-10-CM | POA: Diagnosis not present

## 2017-03-29 DIAGNOSIS — I10 Essential (primary) hypertension: Secondary | ICD-10-CM | POA: Diagnosis not present

## 2017-03-29 DIAGNOSIS — E119 Type 2 diabetes mellitus without complications: Secondary | ICD-10-CM | POA: Diagnosis not present

## 2017-03-29 DIAGNOSIS — Z794 Long term (current) use of insulin: Secondary | ICD-10-CM | POA: Diagnosis not present

## 2017-04-06 DIAGNOSIS — N3946 Mixed incontinence: Secondary | ICD-10-CM | POA: Diagnosis not present

## 2017-05-26 DIAGNOSIS — J0100 Acute maxillary sinusitis, unspecified: Secondary | ICD-10-CM | POA: Diagnosis not present

## 2017-06-27 DIAGNOSIS — F329 Major depressive disorder, single episode, unspecified: Secondary | ICD-10-CM | POA: Diagnosis not present

## 2017-06-27 DIAGNOSIS — I700 Atherosclerosis of aorta: Secondary | ICD-10-CM | POA: Diagnosis not present

## 2017-06-27 DIAGNOSIS — Z794 Long term (current) use of insulin: Secondary | ICD-10-CM | POA: Diagnosis not present

## 2017-06-27 DIAGNOSIS — E669 Obesity, unspecified: Secondary | ICD-10-CM | POA: Diagnosis not present

## 2017-06-27 DIAGNOSIS — Z6834 Body mass index (BMI) 34.0-34.9, adult: Secondary | ICD-10-CM | POA: Diagnosis not present

## 2017-06-27 DIAGNOSIS — J984 Other disorders of lung: Secondary | ICD-10-CM | POA: Diagnosis not present

## 2017-06-27 DIAGNOSIS — E119 Type 2 diabetes mellitus without complications: Secondary | ICD-10-CM | POA: Diagnosis not present

## 2017-06-27 DIAGNOSIS — R1032 Left lower quadrant pain: Secondary | ICD-10-CM | POA: Diagnosis not present

## 2017-06-27 DIAGNOSIS — E7849 Other hyperlipidemia: Secondary | ICD-10-CM | POA: Diagnosis not present

## 2017-06-27 DIAGNOSIS — K760 Fatty (change of) liver, not elsewhere classified: Secondary | ICD-10-CM | POA: Diagnosis not present

## 2017-06-27 DIAGNOSIS — I10 Essential (primary) hypertension: Secondary | ICD-10-CM | POA: Diagnosis not present

## 2017-06-30 DIAGNOSIS — Z794 Long term (current) use of insulin: Secondary | ICD-10-CM | POA: Diagnosis not present

## 2017-06-30 DIAGNOSIS — Z6834 Body mass index (BMI) 34.0-34.9, adult: Secondary | ICD-10-CM | POA: Diagnosis not present

## 2017-06-30 DIAGNOSIS — E119 Type 2 diabetes mellitus without complications: Secondary | ICD-10-CM | POA: Diagnosis not present

## 2017-06-30 DIAGNOSIS — I10 Essential (primary) hypertension: Secondary | ICD-10-CM | POA: Diagnosis not present

## 2017-07-01 DIAGNOSIS — J019 Acute sinusitis, unspecified: Secondary | ICD-10-CM | POA: Diagnosis not present

## 2017-07-01 DIAGNOSIS — R05 Cough: Secondary | ICD-10-CM | POA: Diagnosis not present

## 2017-07-01 DIAGNOSIS — H6691 Otitis media, unspecified, right ear: Secondary | ICD-10-CM | POA: Diagnosis not present

## 2017-07-22 DIAGNOSIS — J310 Chronic rhinitis: Secondary | ICD-10-CM | POA: Diagnosis not present

## 2017-07-22 DIAGNOSIS — J0101 Acute recurrent maxillary sinusitis: Secondary | ICD-10-CM | POA: Diagnosis not present

## 2017-08-01 ENCOUNTER — Other Ambulatory Visit (INDEPENDENT_AMBULATORY_CARE_PROVIDER_SITE_OTHER): Payer: Self-pay | Admitting: Otolaryngology

## 2017-08-01 ENCOUNTER — Ambulatory Visit
Admission: RE | Admit: 2017-08-01 | Discharge: 2017-08-01 | Disposition: A | Discharge status: Home / Self Care | Payer: Medicare Other | Source: Ambulatory Visit | Attending: Otolaryngology | Admitting: Otolaryngology

## 2017-08-01 DIAGNOSIS — J320 Chronic maxillary sinusitis: Secondary | ICD-10-CM | POA: Diagnosis not present

## 2017-08-01 DIAGNOSIS — J329 Chronic sinusitis, unspecified: Secondary | ICD-10-CM

## 2017-08-02 ENCOUNTER — Other Ambulatory Visit (INDEPENDENT_AMBULATORY_CARE_PROVIDER_SITE_OTHER): Payer: Self-pay | Admitting: Otolaryngology

## 2017-08-02 DIAGNOSIS — J329 Chronic sinusitis, unspecified: Secondary | ICD-10-CM

## 2017-08-22 ENCOUNTER — Telehealth: Payer: Self-pay | Admitting: Internal Medicine

## 2017-08-22 ENCOUNTER — Ambulatory Visit (INDEPENDENT_AMBULATORY_CARE_PROVIDER_SITE_OTHER): Payer: Medicare Other | Admitting: Otolaryngology

## 2017-08-22 DIAGNOSIS — J320 Chronic maxillary sinusitis: Secondary | ICD-10-CM | POA: Diagnosis not present

## 2017-08-22 DIAGNOSIS — J310 Chronic rhinitis: Secondary | ICD-10-CM

## 2017-08-22 DIAGNOSIS — G4733 Obstructive sleep apnea (adult) (pediatric): Secondary | ICD-10-CM

## 2017-08-22 NOTE — Telephone Encounter (Signed)
Called and spoke with patient, she states that the mask she uses for the CPAP machine goes over her nose. The bottom part that goes over her bottom lip has been irritating it. She is asking that we send in an order for a new mask. Possibly one that goes only over the nose.   CY please advise, thank you.

## 2017-08-22 NOTE — Telephone Encounter (Signed)
Called and spoke with patient, advised her of CY response. We will place order. Nothing further needed at this time.

## 2017-08-22 NOTE — Telephone Encounter (Signed)
Ok to ask her DME to refit CPAP mask of choice. Current mask is uncomfortable.

## 2017-08-25 DIAGNOSIS — M25562 Pain in left knee: Secondary | ICD-10-CM | POA: Diagnosis not present

## 2017-08-25 DIAGNOSIS — M25561 Pain in right knee: Secondary | ICD-10-CM | POA: Diagnosis not present

## 2017-08-30 DIAGNOSIS — D1801 Hemangioma of skin and subcutaneous tissue: Secondary | ICD-10-CM | POA: Diagnosis not present

## 2017-08-30 DIAGNOSIS — D225 Melanocytic nevi of trunk: Secondary | ICD-10-CM | POA: Diagnosis not present

## 2017-08-30 DIAGNOSIS — D692 Other nonthrombocytopenic purpura: Secondary | ICD-10-CM | POA: Diagnosis not present

## 2017-08-30 DIAGNOSIS — L814 Other melanin hyperpigmentation: Secondary | ICD-10-CM | POA: Diagnosis not present

## 2017-08-30 DIAGNOSIS — L821 Other seborrheic keratosis: Secondary | ICD-10-CM | POA: Diagnosis not present

## 2017-08-30 DIAGNOSIS — D2262 Melanocytic nevi of left upper limb, including shoulder: Secondary | ICD-10-CM | POA: Diagnosis not present

## 2017-08-30 DIAGNOSIS — S80212A Abrasion, left knee, initial encounter: Secondary | ICD-10-CM | POA: Diagnosis not present

## 2017-09-01 DIAGNOSIS — M25562 Pain in left knee: Secondary | ICD-10-CM | POA: Diagnosis not present

## 2017-09-01 DIAGNOSIS — M25561 Pain in right knee: Secondary | ICD-10-CM | POA: Diagnosis not present

## 2017-09-05 DIAGNOSIS — M25561 Pain in right knee: Secondary | ICD-10-CM | POA: Diagnosis not present

## 2017-09-12 DIAGNOSIS — M25561 Pain in right knee: Secondary | ICD-10-CM | POA: Diagnosis not present

## 2017-09-12 DIAGNOSIS — S86811A Strain of other muscle(s) and tendon(s) at lower leg level, right leg, initial encounter: Secondary | ICD-10-CM | POA: Diagnosis not present

## 2017-09-28 DIAGNOSIS — E669 Obesity, unspecified: Secondary | ICD-10-CM | POA: Diagnosis not present

## 2017-09-28 DIAGNOSIS — I700 Atherosclerosis of aorta: Secondary | ICD-10-CM | POA: Diagnosis not present

## 2017-09-28 DIAGNOSIS — E7849 Other hyperlipidemia: Secondary | ICD-10-CM | POA: Diagnosis not present

## 2017-09-28 DIAGNOSIS — K760 Fatty (change of) liver, not elsewhere classified: Secondary | ICD-10-CM | POA: Diagnosis not present

## 2017-09-28 DIAGNOSIS — E038 Other specified hypothyroidism: Secondary | ICD-10-CM | POA: Diagnosis not present

## 2017-09-28 DIAGNOSIS — Z6834 Body mass index (BMI) 34.0-34.9, adult: Secondary | ICD-10-CM | POA: Diagnosis not present

## 2017-09-28 DIAGNOSIS — J984 Other disorders of lung: Secondary | ICD-10-CM | POA: Diagnosis not present

## 2017-09-28 DIAGNOSIS — I10 Essential (primary) hypertension: Secondary | ICD-10-CM | POA: Diagnosis not present

## 2017-09-28 DIAGNOSIS — Z794 Long term (current) use of insulin: Secondary | ICD-10-CM | POA: Diagnosis not present

## 2017-09-28 DIAGNOSIS — E1169 Type 2 diabetes mellitus with other specified complication: Secondary | ICD-10-CM | POA: Diagnosis not present

## 2017-09-28 DIAGNOSIS — F329 Major depressive disorder, single episode, unspecified: Secondary | ICD-10-CM | POA: Diagnosis not present

## 2017-10-16 IMAGING — DX DG CHEST 2V
2 series · 2 of 2 positions shown · non-contrast
Comparison: Chest x-ray 10/26/2012

CLINICAL DATA: Routine.  Follow-up lung nodules.

EXAM:
CHEST  2 VIEW

[chest pa]
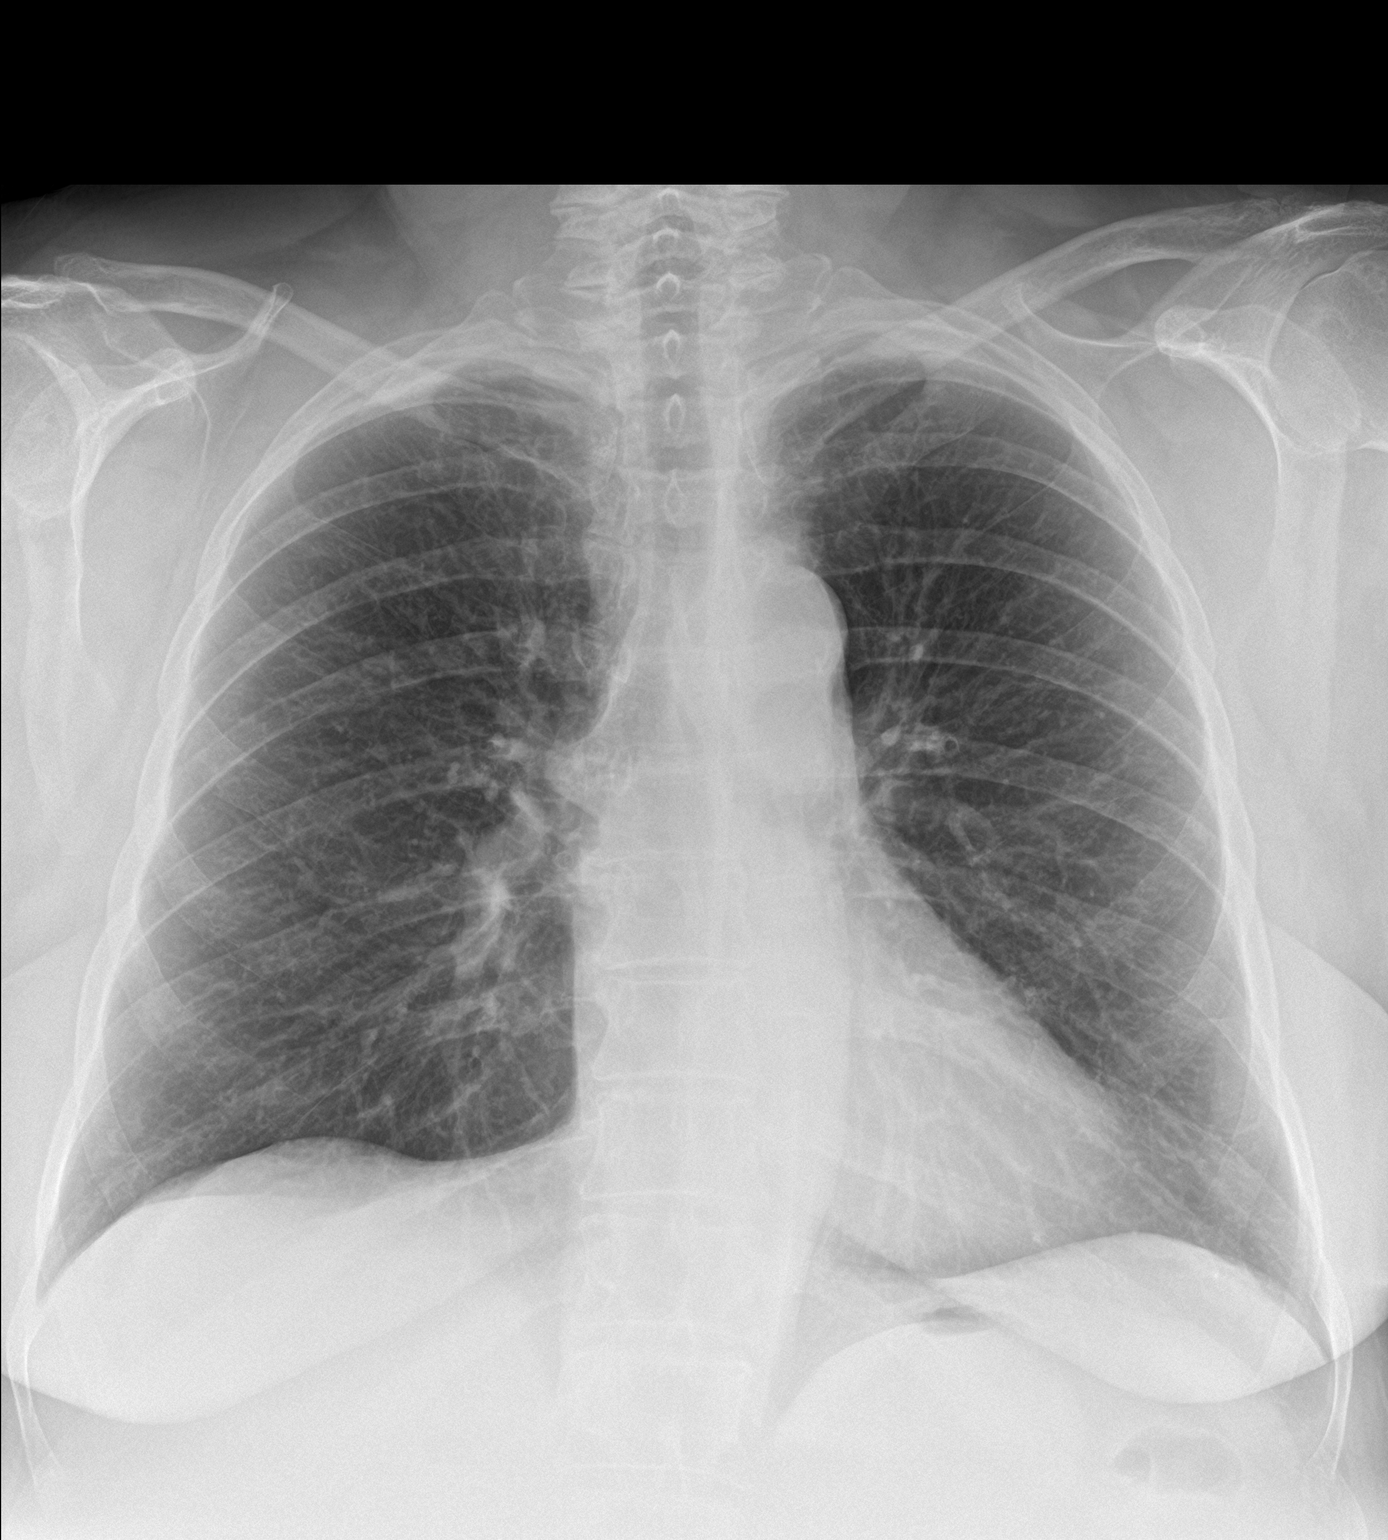

[chest lat]
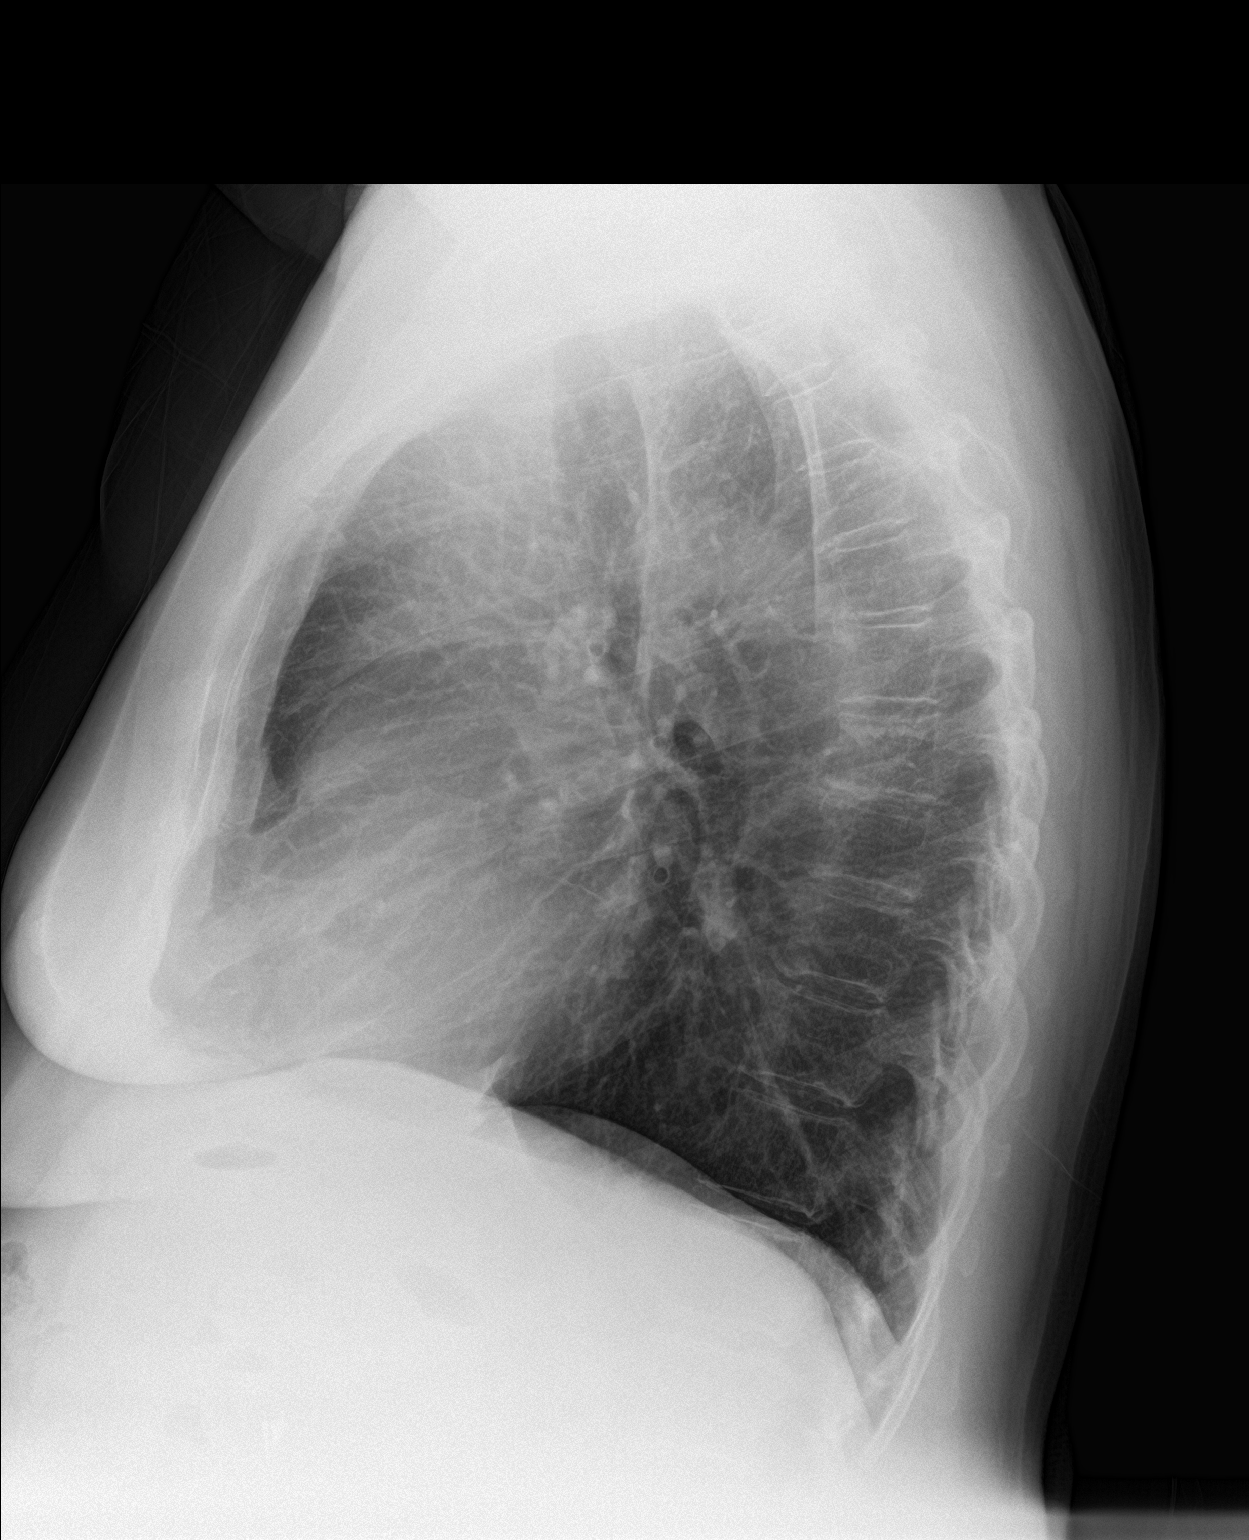

[2 of 2 positions shown; findings below may reference images not displayed]

FINDINGS: Heart and mediastinal contours are within normal limits. No focal
opacities or effusions. No acute bony abnormality.
IMPRESSION: No active cardiopulmonary disease.

## 2017-10-17 DIAGNOSIS — M25561 Pain in right knee: Secondary | ICD-10-CM | POA: Diagnosis not present

## 2017-10-17 DIAGNOSIS — S8391XA Sprain of unspecified site of right knee, initial encounter: Secondary | ICD-10-CM | POA: Diagnosis not present

## 2017-10-17 DIAGNOSIS — Z6834 Body mass index (BMI) 34.0-34.9, adult: Secondary | ICD-10-CM | POA: Diagnosis not present

## 2017-10-17 DIAGNOSIS — K219 Gastro-esophageal reflux disease without esophagitis: Secondary | ICD-10-CM | POA: Diagnosis not present

## 2017-10-17 DIAGNOSIS — N3281 Overactive bladder: Secondary | ICD-10-CM | POA: Diagnosis not present

## 2017-10-17 DIAGNOSIS — H5789 Other specified disorders of eye and adnexa: Secondary | ICD-10-CM | POA: Diagnosis not present

## 2017-10-17 DIAGNOSIS — R682 Dry mouth, unspecified: Secondary | ICD-10-CM | POA: Diagnosis not present

## 2017-10-17 DIAGNOSIS — J3489 Other specified disorders of nose and nasal sinuses: Secondary | ICD-10-CM | POA: Diagnosis not present

## 2017-11-10 DIAGNOSIS — R229 Localized swelling, mass and lump, unspecified: Secondary | ICD-10-CM | POA: Diagnosis not present

## 2017-11-10 DIAGNOSIS — I10 Essential (primary) hypertension: Secondary | ICD-10-CM | POA: Diagnosis not present

## 2017-11-10 DIAGNOSIS — E119 Type 2 diabetes mellitus without complications: Secondary | ICD-10-CM | POA: Diagnosis not present

## 2017-11-11 ENCOUNTER — Other Ambulatory Visit: Payer: Self-pay | Admitting: Internal Medicine

## 2017-11-11 DIAGNOSIS — R2232 Localized swelling, mass and lump, left upper limb: Secondary | ICD-10-CM

## 2017-11-18 ENCOUNTER — Other Ambulatory Visit: Payer: Self-pay | Admitting: Internal Medicine

## 2017-11-18 DIAGNOSIS — R2233 Localized swelling, mass and lump, upper limb, bilateral: Secondary | ICD-10-CM

## 2017-11-22 ENCOUNTER — Other Ambulatory Visit: Payer: Medicare Other

## 2017-11-22 ENCOUNTER — Inpatient Hospital Stay: Admission: RE | Admit: 2017-11-22 | Payer: Medicare Other | Source: Ambulatory Visit

## 2017-11-24 ENCOUNTER — Ambulatory Visit
Admission: RE | Admit: 2017-11-24 | Discharge: 2017-11-24 | Disposition: A | Discharge status: Home / Self Care | Payer: Medicare Other | Source: Ambulatory Visit | Attending: Internal Medicine | Admitting: Internal Medicine

## 2017-11-24 DIAGNOSIS — R2233 Localized swelling, mass and lump, upper limb, bilateral: Secondary | ICD-10-CM

## 2017-11-24 DIAGNOSIS — R928 Other abnormal and inconclusive findings on diagnostic imaging of breast: Secondary | ICD-10-CM | POA: Diagnosis not present

## 2017-11-24 DIAGNOSIS — N6320 Unspecified lump in the left breast, unspecified quadrant: Secondary | ICD-10-CM | POA: Diagnosis not present

## 2017-11-24 DIAGNOSIS — N6001 Solitary cyst of right breast: Secondary | ICD-10-CM | POA: Diagnosis not present

## 2017-11-29 ENCOUNTER — Ambulatory Visit: Payer: Medicare Other | Admitting: Internal Medicine

## 2017-12-13 ENCOUNTER — Encounter (HOSPITAL_COMMUNITY): Payer: Self-pay | Admitting: Physical Therapy

## 2017-12-13 NOTE — Therapy (Signed)
[place], [place], [address] Phone: [phone]   Fax:  [phone]  Patient Details  Name: [name] MRN: [id number] Date of Birth: 01-29-1946 Referring Provider:  No ref. provider found  Encounter Date: 12/13/2017  PHYSICAL THERAPY DISCHARGE SUMMARY  Visits from Start of Care: 7  Current functional level related to goals / functional outcomes: DC   Remaining deficits: Unable to assess    Education / Equipment: N/A  Plan: Patient agrees to discharge.  Patient goals were not met. Patient is being discharged due to not returning since the last visit.  ?????       Deniece Ree PT, DPT, CBIS  Supplemental Physical Therapist [facility]    Pager [phone] Acute Rehab Office [place] [address] [place], [place], [address] Phone: [phone]   Fax:  [phone]

## 2017-12-14 DIAGNOSIS — Z23 Encounter for immunization: Secondary | ICD-10-CM | POA: Diagnosis not present

## 2017-12-21 DIAGNOSIS — J0100 Acute maxillary sinusitis, unspecified: Secondary | ICD-10-CM | POA: Diagnosis not present

## 2017-12-21 DIAGNOSIS — I10 Essential (primary) hypertension: Secondary | ICD-10-CM | POA: Diagnosis not present

## 2017-12-21 DIAGNOSIS — H00019 Hordeolum externum unspecified eye, unspecified eyelid: Secondary | ICD-10-CM | POA: Diagnosis not present

## 2017-12-21 DIAGNOSIS — E1169 Type 2 diabetes mellitus with other specified complication: Secondary | ICD-10-CM | POA: Diagnosis not present

## 2017-12-21 DIAGNOSIS — Z6834 Body mass index (BMI) 34.0-34.9, adult: Secondary | ICD-10-CM | POA: Diagnosis not present

## 2018-01-16 DIAGNOSIS — E038 Other specified hypothyroidism: Secondary | ICD-10-CM | POA: Diagnosis not present

## 2018-01-16 DIAGNOSIS — I10 Essential (primary) hypertension: Secondary | ICD-10-CM | POA: Diagnosis not present

## 2018-01-16 DIAGNOSIS — E1169 Type 2 diabetes mellitus with other specified complication: Secondary | ICD-10-CM | POA: Diagnosis not present

## 2018-01-16 DIAGNOSIS — R82998 Other abnormal findings in urine: Secondary | ICD-10-CM | POA: Diagnosis not present

## 2018-01-23 DIAGNOSIS — E1169 Type 2 diabetes mellitus with other specified complication: Secondary | ICD-10-CM | POA: Diagnosis not present

## 2018-01-23 DIAGNOSIS — Z1389 Encounter for screening for other disorder: Secondary | ICD-10-CM | POA: Diagnosis not present

## 2018-01-23 DIAGNOSIS — Z0000 Encounter for general adult medical examination without abnormal findings: Secondary | ICD-10-CM | POA: Diagnosis not present

## 2018-01-23 DIAGNOSIS — E7849 Other hyperlipidemia: Secondary | ICD-10-CM | POA: Diagnosis not present

## 2018-01-23 DIAGNOSIS — I10 Essential (primary) hypertension: Secondary | ICD-10-CM | POA: Diagnosis not present

## 2018-01-23 DIAGNOSIS — E038 Other specified hypothyroidism: Secondary | ICD-10-CM | POA: Diagnosis not present

## 2018-01-23 DIAGNOSIS — Z23 Encounter for immunization: Secondary | ICD-10-CM | POA: Diagnosis not present

## 2018-01-23 DIAGNOSIS — Z794 Long term (current) use of insulin: Secondary | ICD-10-CM | POA: Diagnosis not present

## 2018-01-23 DIAGNOSIS — I700 Atherosclerosis of aorta: Secondary | ICD-10-CM | POA: Diagnosis not present

## 2018-01-23 DIAGNOSIS — M24542 Contracture, left hand: Secondary | ICD-10-CM | POA: Diagnosis not present

## 2018-01-23 DIAGNOSIS — M2550 Pain in unspecified joint: Secondary | ICD-10-CM | POA: Diagnosis not present

## 2018-01-23 DIAGNOSIS — K760 Fatty (change of) liver, not elsewhere classified: Secondary | ICD-10-CM | POA: Diagnosis not present

## 2018-02-02 DIAGNOSIS — M779 Enthesopathy, unspecified: Secondary | ICD-10-CM | POA: Diagnosis not present

## 2018-02-02 DIAGNOSIS — M659 Synovitis and tenosynovitis, unspecified: Secondary | ICD-10-CM | POA: Diagnosis not present

## 2018-02-02 DIAGNOSIS — M25632 Stiffness of left wrist, not elsewhere classified: Secondary | ICD-10-CM | POA: Diagnosis not present

## 2018-02-02 DIAGNOSIS — M654 Radial styloid tenosynovitis [de Quervain]: Secondary | ICD-10-CM | POA: Diagnosis not present

## 2018-02-02 DIAGNOSIS — R2232 Localized swelling, mass and lump, left upper limb: Secondary | ICD-10-CM | POA: Diagnosis not present

## 2018-02-02 DIAGNOSIS — M67844 Other specified disorders of tendon, left hand: Secondary | ICD-10-CM | POA: Diagnosis not present

## 2018-02-03 DIAGNOSIS — Z1212 Encounter for screening for malignant neoplasm of rectum: Secondary | ICD-10-CM | POA: Diagnosis not present

## 2018-02-07 DIAGNOSIS — I10 Essential (primary) hypertension: Secondary | ICD-10-CM | POA: Diagnosis not present

## 2018-02-07 DIAGNOSIS — B999 Unspecified infectious disease: Secondary | ICD-10-CM | POA: Diagnosis not present

## 2018-02-07 DIAGNOSIS — Z6834 Body mass index (BMI) 34.0-34.9, adult: Secondary | ICD-10-CM | POA: Diagnosis not present

## 2018-02-12 ENCOUNTER — Encounter: Payer: Self-pay | Admitting: Internal Medicine

## 2018-02-13 ENCOUNTER — Encounter: Payer: Self-pay | Admitting: Internal Medicine

## 2018-02-13 ENCOUNTER — Ambulatory Visit (INDEPENDENT_AMBULATORY_CARE_PROVIDER_SITE_OTHER): Payer: Medicare Other | Admitting: Internal Medicine

## 2018-02-13 VITALS — BP 130/70 | HR 70 | Ht 63.5 in | Wt 199.8 lb

## 2018-02-13 DIAGNOSIS — G4733 Obstructive sleep apnea (adult) (pediatric): Secondary | ICD-10-CM

## 2018-02-13 DIAGNOSIS — F5104 Psychophysiologic insomnia: Secondary | ICD-10-CM | POA: Diagnosis not present

## 2018-02-13 NOTE — Patient Instructions (Signed)
Order- DME [name]- please replace old CPAP machine if due, auto 8-15, mask of choice, humidifier, supplies, [place]  Please call if we can help

## 2018-02-13 NOTE — Progress Notes (Signed)
Patient ID: [name], female    DOB: November 12, 1945, 72 y.o.   MRN: [id number]  HPI F followed for sleep apnea, allergic rhinitis, hx of lung nodule. NPSG 03/05/07- AHI 31.7/ hr, desaturation to 85%, CPAP to 14, body weight 195 lbs  -------------------------------------------------------------------------------- 11/29/16- 72 year old female never smoker followed for OSA/CPAP, allergic rhinitis, prior lung nodule, complicated by DM 2, GERD, HBP, hypothyroid CPAP 10/[facility]> new machine auto 8-15 DME: [facility].  Pt states that her CPAP has been working well for her. States that sometimes machine does sound louder than it should. Not sure how old this machine is. Download 99% compliance averaging 7-1/2 hours per night with AHI 2.1/hour We discussed changing to AutoPap machine if she is eligible for replacement We also discussed remote history of lung nodule. She has had some dry morning cough, no adenopathy, night sweats or fever. Gets flu vaccine at her PCP   02/13/2018- 72 year old female never smoker followed for OSA/CPAP, allergic rhinitis, prior lung nodule, complicated by DM 2, GERD, HBP, hypothyroid CPAP auto 8-15/[facility] [name] CT max face 08/01/2017-chronic maxillary sinusitis unchanged from 2012 CXR 11/29/2016- No active cardiopulmonary disease. -----67yr f/u for OSA. Uses [facility] [name] as her DME.  Download 97% compliance AHI 2.4/hour She feels she is doing very well.  Comfortable with CPAP.  No acute illness so far this winter.  Machine is old and we discussed replacing.   Review of Systems-See HPI  + = positive Constitutional:   No-   weight loss, , fevers, chills, fatigue, lassitude. HEENT:   Mild  headaches, No-difficulty swallowing, tooth/dental problems, sore throat,       No-  sneezing, itching, ear ache,   +nasal congestion, +post nasal drip- improved,  CV:  No-   chest pain, orthopnea, PND, swelling in lower extremities, anasarca, dizziness, palpitations Resp: No-    shortness of breath with exertion or at rest.              No-   productive cough,  No non-productive cough,  No-  coughing up of blood.              No-   change in color of mucus.  No- wheezing.   Skin: No-   rash or lesions. GI:  No-   heartburn, indigestion, abdominal pain, nausea, vomiting,  GU: . MS:  +   joint pain or swelling.  Neuro- nothing unusual:  Psych:  No- change in mood or affect. No depression or anxiety.  No memory loss.  Objective:   Physical Exam General- Alert, Oriented, Affect-appropriate, Distress- none acute, +overweight Skin- rash-none, lesions- none, excoriation- none Lymphadenopathy- none Head- atraumatic            Eyes- Gross vision intact, PERRLA, conjunctivae clear secretions            Ears- Hearing, canals-normal            Nose- turbinate edema, no-Septal dev, mucus, polyps, erosion, perforation             Throat- Mallampati III , mucosa Dry, drainage- none, tonsils- atrophic Neck- flexible , trachea midline, no stridor , thyroid nl, carotid no bruit Chest - symmetrical excursion , unlabored           Heart/CV- RRR ,  +Murmur 1/6 systolic AS , no gallop  , no rub, nl s1 s2                           -  JVD- none , edema- none, stasis changes- none, varices- none           Lung- clear to P&A, wheeze- none, cough- none , dullness-none, rub- none           Chest wall-  Abd-  Br/ Gen/ Rectal- Not done, not indicated Extrem- cyanosis- none, clubbing, none, atrophy- none, strength- nl.                        + Left wrist brace-tendinitis Neuro- grossly intact to observation

## 2018-02-15 ENCOUNTER — Ambulatory Visit: Payer: Medicare Other | Admitting: Internal Medicine

## 2018-02-23 ENCOUNTER — Telehealth: Payer: Self-pay | Admitting: Internal Medicine

## 2018-02-23 DIAGNOSIS — G4733 Obstructive sleep apnea (adult) (pediatric): Secondary | ICD-10-CM

## 2018-02-24 NOTE — Telephone Encounter (Signed)
Order was placed the same day as her OV with CY 02/13/18 for her to receive new cpap machine from [facility].  Called and spoke with pt who states that she still has not been able to receive a new cpap machine from [facility] as they are telling her that they never received an order. I stated to her that the order was placed by [place] after her OV and it was sent to [facility]. Pt expressed understanding.  Due to what has been happening, pt is wanting to switch DME's from [facility] to [facility]. Order has been placed for the DME switch. Nothing further needed.

## 2018-03-09 DIAGNOSIS — M654 Radial styloid tenosynovitis [de Quervain]: Secondary | ICD-10-CM | POA: Diagnosis not present

## 2018-03-24 DIAGNOSIS — J029 Acute pharyngitis, unspecified: Secondary | ICD-10-CM | POA: Diagnosis not present

## 2018-04-04 ENCOUNTER — Ambulatory Visit: Payer: Medicare Other | Admitting: Internal Medicine

## 2018-04-10 DIAGNOSIS — M25532 Pain in left wrist: Secondary | ICD-10-CM | POA: Diagnosis not present

## 2018-04-18 NOTE — Assessment & Plan Note (Signed)
She continues to benefit from CPAP with improved sleep that is comfortable using it.  Download confirms good compliance and control.  Machine is old. Plan-replace old CPAP machine, continuing auto 8-15

## 2018-04-18 NOTE — Assessment & Plan Note (Signed)
Basic sleep hygiene reviewed.  She can use an occasional OTC product if needed.

## 2018-04-25 DIAGNOSIS — R0981 Nasal congestion: Secondary | ICD-10-CM | POA: Diagnosis not present

## 2018-04-25 DIAGNOSIS — E1169 Type 2 diabetes mellitus with other specified complication: Secondary | ICD-10-CM | POA: Diagnosis not present

## 2018-04-25 DIAGNOSIS — B001 Herpesviral vesicular dermatitis: Secondary | ICD-10-CM | POA: Diagnosis not present

## 2018-04-25 DIAGNOSIS — I10 Essential (primary) hypertension: Secondary | ICD-10-CM | POA: Diagnosis not present

## 2018-04-25 DIAGNOSIS — Z794 Long term (current) use of insulin: Secondary | ICD-10-CM | POA: Diagnosis not present

## 2018-05-19 DIAGNOSIS — M25572 Pain in left ankle and joints of left foot: Secondary | ICD-10-CM | POA: Diagnosis not present

## 2018-05-26 DIAGNOSIS — M25532 Pain in left wrist: Secondary | ICD-10-CM | POA: Diagnosis not present

## 2018-05-26 DIAGNOSIS — M79642 Pain in left hand: Secondary | ICD-10-CM | POA: Diagnosis not present

## 2018-05-26 DIAGNOSIS — S52572A Other intraarticular fracture of lower end of left radius, initial encounter for closed fracture: Secondary | ICD-10-CM | POA: Diagnosis not present

## 2018-05-27 ENCOUNTER — Emergency Department (HOSPITAL_COMMUNITY): Payer: Medicare Other

## 2018-05-27 ENCOUNTER — Emergency Department (HOSPITAL_COMMUNITY)
Admission: EM | Admit: 2018-05-27 | Discharge: 2018-05-27 | Disposition: A | Discharge status: Home / Self Care | Payer: Medicare Other | Attending: Emergency Medicine | Admitting: Emergency Medicine

## 2018-05-27 ENCOUNTER — Other Ambulatory Visit: Payer: Self-pay

## 2018-05-27 ENCOUNTER — Encounter (HOSPITAL_COMMUNITY): Payer: Self-pay | Admitting: *Deleted

## 2018-05-27 ENCOUNTER — Ambulatory Visit (HOSPITAL_COMMUNITY)
Admission: EM | Admit: 2018-05-27 | Discharge: 2018-05-27 | Disposition: A | Discharge status: Home / Self Care | Payer: Medicare Other | Source: Home / Self Care

## 2018-05-27 DIAGNOSIS — E119 Type 2 diabetes mellitus without complications: Secondary | ICD-10-CM | POA: Diagnosis not present

## 2018-05-27 DIAGNOSIS — S52572A Other intraarticular fracture of lower end of left radius, initial encounter for closed fracture: Secondary | ICD-10-CM | POA: Diagnosis not present

## 2018-05-27 DIAGNOSIS — S6292XD Unspecified fracture of left wrist and hand, subsequent encounter for fracture with routine healing: Secondary | ICD-10-CM | POA: Diagnosis not present

## 2018-05-27 DIAGNOSIS — S62102D Fracture of unspecified carpal bone, left wrist, subsequent encounter for fracture with routine healing: Secondary | ICD-10-CM

## 2018-05-27 DIAGNOSIS — S52502D Unspecified fracture of the lower end of left radius, subsequent encounter for closed fracture with routine healing: Secondary | ICD-10-CM | POA: Diagnosis not present

## 2018-05-27 DIAGNOSIS — Z794 Long term (current) use of insulin: Secondary | ICD-10-CM | POA: Diagnosis not present

## 2018-05-27 DIAGNOSIS — M79602 Pain in left arm: Secondary | ICD-10-CM | POA: Diagnosis not present

## 2018-05-27 DIAGNOSIS — W19XXXD Unspecified fall, subsequent encounter: Secondary | ICD-10-CM | POA: Insufficient documentation

## 2018-05-27 DIAGNOSIS — I10 Essential (primary) hypertension: Secondary | ICD-10-CM | POA: Insufficient documentation

## 2018-05-27 DIAGNOSIS — S52615A Nondisplaced fracture of left ulna styloid process, initial encounter for closed fracture: Secondary | ICD-10-CM | POA: Diagnosis not present

## 2018-05-27 DIAGNOSIS — E785 Hyperlipidemia, unspecified: Secondary | ICD-10-CM | POA: Diagnosis not present

## 2018-05-27 DIAGNOSIS — E039 Hypothyroidism, unspecified: Secondary | ICD-10-CM | POA: Insufficient documentation

## 2018-05-27 MED ORDER — MORPHINE SULFATE (PF) 4 MG/ML IV SOLN
4.0000 mg | Freq: Once | INTRAVENOUS | Status: AC
  Administered 2018-05-27: 4 mg via INTRAVENOUS
  Filled 2018-05-27: qty 1

## 2018-05-27 MED ORDER — OXYCODONE-ACETAMINOPHEN 5-325 MG PO TABS
2.0000 | ORAL_TABLET | Freq: Once | ORAL | Status: AC
  Administered 2018-05-27: 2 via ORAL
  Filled 2018-05-27: qty 2

## 2018-05-27 NOTE — ED Notes (Signed)
Patient was in wrong location

## 2018-05-27 NOTE — Progress Notes (Signed)
Orthopedic Tech Progress Note Patient Details:  [place] 09/01/1945 [id number]  Ortho Devices Type of Ortho Device: Arm sling, Ace wrap, Short arm splint Ortho Device/Splint Interventions: Application   Post Interventions Patient Tolerated: Well Instructions Provided: Care of device   [place] Pink 05/27/2018, 11:57 AM

## 2018-05-27 NOTE — Discharge Instructions (Signed)
We removed your cast and converted you to a splint. Take your pain medication as directed, apply ice today, and keep the wrist elevated above your heart. Call to update your Orthopedic provider on Monday.

## 2018-05-27 NOTE — ED Notes (Signed)
Patient verbalizes understanding of discharge instructions. Opportunity for questioning and answers were provided. Armband removed by staff, pt discharged from ED.  

## 2018-05-27 NOTE — ED Triage Notes (Signed)
PT presents with ongoing Lt arm pain . Pt had a cast placed for FX arm on Friday.

## 2018-05-27 NOTE — Progress Notes (Signed)
Orthopedic Tech Progress Note Patient Details:  [place] 1945/07/03 [id number]  Casting Cast Intervention: Removal  Post Interventions Patient Tolerated: Well Instructions Provided: Care of device     [place] Pink 05/27/2018, 11:47 AM

## 2018-05-27 NOTE — ED Provider Notes (Signed)
Emergency Department Provider Note   I have reviewed the triage vital signs and the nursing notes.   HISTORY  Chief Complaint Arm Pain   HPI [place] is a 73 y.o. female with PMH of DM, HTN, and HLD presents to the emergency department for evaluation of left forearm/wrist pain and swelling.  The patient had a fall yesterday which resulted in fracture.  She went to [facility] and was evaluated there.  She was placed in a cast after fracture was diagnosed.  Patient has kept the arm/wrist elevated and applied ice but is experiencing increasing pain under the cast.  Pain radiates to the elbow.  She reports it as severe.  No numbness in the fingers.  She attempted to go back to [facility] this morning for Saturday clinic but was told it was closed.  She presents to the emergency department for evaluation.  Denies swelling through the entire arm.  No chest pain or shortness of breath.  Past Medical History:  Diagnosis Date  . DM (diabetes mellitus) ([place]) 2013   insulin  . Eustachian tube dysfunction   . GERD (gastroesophageal reflux disease)   . Hyperlipidemia   . Hypertension   . Hypothyroid   . Lung nodule    CT abd by [facility] 09/29/2009  . OSA (obstructive sleep apnea)   . Osteopenia     Patient Active Problem List   Diagnosis Date Noted  . Pain in left hip 10/05/2016  . Pain in right hip 10/05/2016  . Trochanteric bursitis, right hip 10/05/2016  . Trochanteric bursitis, left hip 10/05/2016  . Chronic insomnia 01/28/2011  . Seasonal and perennial allergic rhinitis 05/28/2010  . Lung nodule 05/28/2010  . HYPERLIPIDEMIA 11/23/2009  . G E R D 11/23/2009  . EUSTACHIAN TUBE DYSFUNCTION 11/18/2009  . HYPERTENSION 11/18/2009  . Unspecified hypothyroidism 03/14/2007  . Obstructive sleep apnea 03/14/2007  . FIBROMYALGIA 03/14/2007    Past Surgical History:  Procedure Laterality Date  . ANKLE SURGERY    . Anteriorvesicourethropexy    . BUNIONECTOMY    .  COLONOSCOPY    . NASAL SINUS SURGERY    . PATELLA FRACTURE SURGERY  2009   Right  . TONSILLECTOMY    . TOTAL ABDOMINAL HYSTERECTOMY    . WRIST FRACTURE SURGERY  2009    Allergies Patient has no known allergies.  Family History  Problem Relation Age of Onset  . Heart attack Mother   . Colon cancer Father     Social History Social History   Tobacco Use  . Smoking status: Never Smoker  . Smokeless tobacco: Never Used  Substance Use Topics  . Alcohol use: No  . Drug use: No    Review of Systems  Constitutional: No fever/chills Eyes: No visual changes. ENT: No sore throat. Cardiovascular: Denies chest pain. Respiratory: Denies shortness of breath. Gastrointestinal: No abdominal pain.  No nausea, no vomiting.  No diarrhea.  No constipation. Genitourinary: Negative for dysuria. Musculoskeletal: Negative for back pain. Left forearm pain/swelling.  Skin: Negative for rash. Neurological: Negative for headaches, focal weakness or numbness.  10-point ROS otherwise negative.  ____________________________________________   PHYSICAL EXAM:  VITAL SIGNS: ED Triage Vitals  Enc Vitals Group     BP 05/27/18 1100 102/82     Pulse Rate 05/27/18 1100 81     Resp 05/27/18 1100 16     Temp 05/27/18 1100 97.9 F (36.6 C)     Temp Source 05/27/18 1100 Oral  SpO2 05/27/18 1100 99 %     Weight 05/27/18 1059 200 lb (90.7 kg)     Height 05/27/18 1059 5\' 3"  (1.6 m)     Pain Score 05/27/18 1058 10    Constitutional: Alert and oriented. Well appearing and in no acute distress. Eyes: Conjunctivae are normal.  Head: Atraumatic. Nose: No congestion/rhinnorhea. Mouth/Throat: Mucous membranes are moist.  Neck: No stridor.   Cardiovascular: Normal rate, regular rhythm. Good peripheral circulation. Grossly normal heart sounds.   Respiratory: Normal respiratory effort.  No retractions. Lungs CTAB. Gastrointestinal: Soft and nontender. No distention.  Musculoskeletal: No lower  extremity tenderness nor edema. No gross deformities of extremities. Cast over the left wrist. No elbow or LUE swelling visible.  Neurologic:  Normal speech and language. No gross focal neurologic deficits are appreciated.  Skin:  Skin is warm, dry and intact. No rash noted.  ____________________________________________  RADIOLOGY  Dg Wrist Complete Left  Result Date: 05/27/2018 CLINICAL DATA:  Left wrist pain and swelling EXAM: LEFT WRIST - COMPLETE 3+ VIEW COMPARISON:  None. FINDINGS: Left wrist is in a splint, with attendant artifact. Mildly comminuted distal radius fracture with intra-articular extension. There is minor dorsal impaction. Nondisplaced ulnar styloid fracture. IMPRESSION: Intra-articular distal radius fracture with mild impaction. Nondisplaced ulnar styloid fracture. Electronically Signed   By: Monte Fantasia M.D.   On: 05/27/2018 13:54    ____________________________________________   PROCEDURES  Procedure(s) performed:   Procedures  None  ____________________________________________   INITIAL IMPRESSION / ASSESSMENT AND PLAN / ED COURSE  Pertinent labs & imaging results that were available during my care of the patient were reviewed by me and considered in my medical decision making (see chart for details).  Patient presents to the emergency department with left arm pain and swelling.  A cast was placed by orthopedics yesterday following an acute injury.  She has slightly diminished capillary refill but overall good peripheral circulation.  No numbness.  Plan to remove the cast and place a splint to allow for swelling in the acute setting.  Patient has follow-up with [facility] scheduled and can be transition to cast once swelling decreased.   02:00 PM  Patient feeling much better here after conversion of cast to splint.  Post-splint x-ray shows no displacement of fracture.  Discussed ice and elevation for the remainder of today.  Patient to call orthopedic surgery  on Monday to discuss the ED visit.  She has an appointment scheduled for Thursday which seems appropriate follow-up timeframe.  Discussed ED return precautions.  Exam with the cast off shows normal pulses and no clinical concern for upper extremity DVT. Discussed ED return precautions.  ____________________________________________  FINAL CLINICAL IMPRESSION(S) / ED DIAGNOSES  Final diagnoses:  Closed fracture of left wrist with routine healing, subsequent encounter  Left arm pain    MEDICATIONS GIVEN DURING THIS VISIT:  Medications  oxyCODONE-acetaminophen (PERCOCET/ROXICET) 5-325 MG per tablet 2 tablet (2 tablets Oral Given 05/27/18 1213)  morphine 4 MG/ML injection 4 mg (4 mg Intravenous Given 05/27/18 1251)    Note:  This document was prepared using Dragon voice recognition software and may include unintentional dictation errors.  Nanda Quinton, MD Emergency Medicine    Long, [facility], MD 05/27/18 [phone]

## 2018-05-29 DIAGNOSIS — S52572D Other intraarticular fracture of lower end of left radius, subsequent encounter for closed fracture with routine healing: Secondary | ICD-10-CM | POA: Diagnosis not present

## 2018-05-29 DIAGNOSIS — M25532 Pain in left wrist: Secondary | ICD-10-CM | POA: Diagnosis not present

## 2018-06-05 DIAGNOSIS — S52572D Other intraarticular fracture of lower end of left radius, subsequent encounter for closed fracture with routine healing: Secondary | ICD-10-CM | POA: Diagnosis not present

## 2018-06-05 DIAGNOSIS — M25532 Pain in left wrist: Secondary | ICD-10-CM | POA: Diagnosis not present

## 2018-06-19 DIAGNOSIS — S52572D Other intraarticular fracture of lower end of left radius, subsequent encounter for closed fracture with routine healing: Secondary | ICD-10-CM | POA: Diagnosis not present

## 2018-06-28 DIAGNOSIS — M25572 Pain in left ankle and joints of left foot: Secondary | ICD-10-CM | POA: Diagnosis not present

## 2018-06-28 DIAGNOSIS — M13872 Other specified arthritis, left ankle and foot: Secondary | ICD-10-CM | POA: Diagnosis not present

## 2018-07-10 DIAGNOSIS — M25532 Pain in left wrist: Secondary | ICD-10-CM | POA: Diagnosis not present

## 2018-07-10 DIAGNOSIS — S52572D Other intraarticular fracture of lower end of left radius, subsequent encounter for closed fracture with routine healing: Secondary | ICD-10-CM | POA: Diagnosis not present

## 2018-07-24 DIAGNOSIS — H0015 Chalazion left lower eyelid: Secondary | ICD-10-CM | POA: Diagnosis not present

## 2018-07-31 DIAGNOSIS — S52572D Other intraarticular fracture of lower end of left radius, subsequent encounter for closed fracture with routine healing: Secondary | ICD-10-CM | POA: Diagnosis not present

## 2018-07-31 DIAGNOSIS — M25532 Pain in left wrist: Secondary | ICD-10-CM | POA: Diagnosis not present

## 2018-08-08 DIAGNOSIS — Z794 Long term (current) use of insulin: Secondary | ICD-10-CM | POA: Diagnosis not present

## 2018-08-08 DIAGNOSIS — I10 Essential (primary) hypertension: Secondary | ICD-10-CM | POA: Diagnosis not present

## 2018-08-08 DIAGNOSIS — E1169 Type 2 diabetes mellitus with other specified complication: Secondary | ICD-10-CM | POA: Diagnosis not present

## 2018-08-23 DIAGNOSIS — M2550 Pain in unspecified joint: Secondary | ICD-10-CM | POA: Diagnosis not present

## 2018-08-23 DIAGNOSIS — E785 Hyperlipidemia, unspecified: Secondary | ICD-10-CM | POA: Diagnosis not present

## 2018-08-23 DIAGNOSIS — K760 Fatty (change of) liver, not elsewhere classified: Secondary | ICD-10-CM | POA: Diagnosis not present

## 2018-08-23 DIAGNOSIS — Z794 Long term (current) use of insulin: Secondary | ICD-10-CM | POA: Diagnosis not present

## 2018-08-23 DIAGNOSIS — E039 Hypothyroidism, unspecified: Secondary | ICD-10-CM | POA: Diagnosis not present

## 2018-08-23 DIAGNOSIS — T50995D Adverse effect of other drugs, medicaments and biological substances, subsequent encounter: Secondary | ICD-10-CM | POA: Diagnosis not present

## 2018-08-23 DIAGNOSIS — E1169 Type 2 diabetes mellitus with other specified complication: Secondary | ICD-10-CM | POA: Diagnosis not present

## 2018-08-23 DIAGNOSIS — K219 Gastro-esophageal reflux disease without esophagitis: Secondary | ICD-10-CM | POA: Diagnosis not present

## 2018-08-23 DIAGNOSIS — I700 Atherosclerosis of aorta: Secondary | ICD-10-CM | POA: Diagnosis not present

## 2018-08-23 DIAGNOSIS — I10 Essential (primary) hypertension: Secondary | ICD-10-CM | POA: Diagnosis not present

## 2018-08-23 DIAGNOSIS — Z1331 Encounter for screening for depression: Secondary | ICD-10-CM | POA: Diagnosis not present

## 2018-08-23 DIAGNOSIS — M797 Fibromyalgia: Secondary | ICD-10-CM | POA: Diagnosis not present

## 2018-08-23 DIAGNOSIS — M24542 Contracture, left hand: Secondary | ICD-10-CM | POA: Diagnosis not present

## 2018-09-11 DIAGNOSIS — M25532 Pain in left wrist: Secondary | ICD-10-CM | POA: Diagnosis not present

## 2018-09-11 DIAGNOSIS — S52572D Other intraarticular fracture of lower end of left radius, subsequent encounter for closed fracture with routine healing: Secondary | ICD-10-CM | POA: Diagnosis not present

## 2018-09-18 DIAGNOSIS — H10413 Chronic giant papillary conjunctivitis, bilateral: Secondary | ICD-10-CM | POA: Diagnosis not present

## 2018-09-28 DIAGNOSIS — M25561 Pain in right knee: Secondary | ICD-10-CM | POA: Diagnosis not present

## 2018-09-29 DIAGNOSIS — L814 Other melanin hyperpigmentation: Secondary | ICD-10-CM | POA: Diagnosis not present

## 2018-09-29 DIAGNOSIS — L821 Other seborrheic keratosis: Secondary | ICD-10-CM | POA: Diagnosis not present

## 2018-09-29 DIAGNOSIS — D224 Melanocytic nevi of scalp and neck: Secondary | ICD-10-CM | POA: Diagnosis not present

## 2018-09-29 DIAGNOSIS — L858 Other specified epidermal thickening: Secondary | ICD-10-CM | POA: Diagnosis not present

## 2018-09-29 DIAGNOSIS — D225 Melanocytic nevi of trunk: Secondary | ICD-10-CM | POA: Diagnosis not present

## 2018-09-29 DIAGNOSIS — D1801 Hemangioma of skin and subcutaneous tissue: Secondary | ICD-10-CM | POA: Diagnosis not present

## 2018-10-03 DIAGNOSIS — H2513 Age-related nuclear cataract, bilateral: Secondary | ICD-10-CM | POA: Diagnosis not present

## 2018-10-03 DIAGNOSIS — H5213 Myopia, bilateral: Secondary | ICD-10-CM | POA: Diagnosis not present

## 2018-10-03 DIAGNOSIS — E119 Type 2 diabetes mellitus without complications: Secondary | ICD-10-CM | POA: Diagnosis not present

## 2018-10-18 DIAGNOSIS — H35371 Puckering of macula, right eye: Secondary | ICD-10-CM | POA: Diagnosis not present

## 2018-10-18 DIAGNOSIS — H2513 Age-related nuclear cataract, bilateral: Secondary | ICD-10-CM | POA: Diagnosis not present

## 2018-10-30 DIAGNOSIS — E7849 Other hyperlipidemia: Secondary | ICD-10-CM | POA: Diagnosis not present

## 2018-10-30 DIAGNOSIS — E1169 Type 2 diabetes mellitus with other specified complication: Secondary | ICD-10-CM | POA: Diagnosis not present

## 2018-10-30 DIAGNOSIS — R51 Headache: Secondary | ICD-10-CM | POA: Diagnosis not present

## 2018-10-30 DIAGNOSIS — I10 Essential (primary) hypertension: Secondary | ICD-10-CM | POA: Diagnosis not present

## 2018-10-30 DIAGNOSIS — E785 Hyperlipidemia, unspecified: Secondary | ICD-10-CM | POA: Diagnosis not present

## 2018-10-30 DIAGNOSIS — R42 Dizziness and giddiness: Secondary | ICD-10-CM | POA: Diagnosis not present

## 2018-10-31 ENCOUNTER — Other Ambulatory Visit (HOSPITAL_COMMUNITY): Payer: Self-pay | Admitting: Internal Medicine

## 2018-10-31 ENCOUNTER — Other Ambulatory Visit: Payer: Self-pay

## 2018-10-31 ENCOUNTER — Ambulatory Visit (HOSPITAL_COMMUNITY)
Admission: RE | Admit: 2018-10-31 | Discharge: 2018-10-31 | Disposition: A | Discharge status: Home / Self Care | Payer: Medicare Other | Source: Ambulatory Visit | Attending: Family | Admitting: Family

## 2018-10-31 DIAGNOSIS — R9082 White matter disease, unspecified: Secondary | ICD-10-CM | POA: Diagnosis not present

## 2018-10-31 DIAGNOSIS — E237 Disorder of pituitary gland, unspecified: Secondary | ICD-10-CM | POA: Diagnosis not present

## 2018-10-31 DIAGNOSIS — R42 Dizziness and giddiness: Secondary | ICD-10-CM | POA: Insufficient documentation

## 2018-10-31 DIAGNOSIS — R930 Abnormal findings on diagnostic imaging of skull and head, not elsewhere classified: Secondary | ICD-10-CM | POA: Diagnosis not present

## 2018-11-02 DIAGNOSIS — H2511 Age-related nuclear cataract, right eye: Secondary | ICD-10-CM | POA: Diagnosis not present

## 2018-11-02 DIAGNOSIS — H25811 Combined forms of age-related cataract, right eye: Secondary | ICD-10-CM | POA: Diagnosis not present

## 2018-11-08 DIAGNOSIS — Z794 Long term (current) use of insulin: Secondary | ICD-10-CM | POA: Diagnosis not present

## 2018-11-08 DIAGNOSIS — I10 Essential (primary) hypertension: Secondary | ICD-10-CM | POA: Diagnosis not present

## 2018-11-08 DIAGNOSIS — E1169 Type 2 diabetes mellitus with other specified complication: Secondary | ICD-10-CM | POA: Diagnosis not present

## 2018-11-08 DIAGNOSIS — E7849 Other hyperlipidemia: Secondary | ICD-10-CM | POA: Diagnosis not present

## 2018-11-09 ENCOUNTER — Ambulatory Visit: Payer: Medicare Other | Attending: Internal Medicine | Admitting: Physical Therapy

## 2018-11-09 ENCOUNTER — Other Ambulatory Visit: Payer: Self-pay

## 2018-11-09 VITALS — BP 147/97 | HR 92

## 2018-11-09 DIAGNOSIS — R42 Dizziness and giddiness: Secondary | ICD-10-CM | POA: Diagnosis not present

## 2018-11-10 ENCOUNTER — Encounter: Payer: Self-pay | Admitting: Physical Therapy

## 2018-11-10 NOTE — Therapy (Signed)
[place] [address] [place], [place], [address] Phone: [phone]   Fax:  [phone]  Physical Therapy Evaluation  Patient Details  Name: [name] MRN: [id number] Date of Birth: July 15, 1945 Referring Provider (PT): Dr. Burnard Bunting   Encounter Date: 11/09/2018  PT End of Session - 11/10/18 1248    Visit Number  1    Number of Visits  1   pt pt request due to only min. c/o lightheadedness at today's visit   Authorization Type  Medicare    Authorization Time Period  11-09-18 - 01-09-19    PT Start Time  1319    PT Stop Time  1400    PT Time Calculation (min)  41 min    Activity Tolerance  Patient tolerated treatment well    Behavior During Therapy  [facility] for tasks assessed/performed       Past Medical History:  Diagnosis Date  . DM (diabetes mellitus) ([place]) 2013   insulin  . Eustachian tube dysfunction   . GERD (gastroesophageal reflux disease)   . Hyperlipidemia   . Hypertension   . Hypothyroid   . Lung nodule    CT abd by [facility] 09/29/2009  . OSA (obstructive sleep apnea)   . Osteopenia     Past Surgical History:  Procedure Laterality Date  . ANKLE SURGERY    . Anteriorvesicourethropexy    . BUNIONECTOMY    . COLONOSCOPY    . NASAL SINUS SURGERY    . PATELLA FRACTURE SURGERY  2009   Right  . TONSILLECTOMY    . TOTAL ABDOMINAL HYSTERECTOMY    . WRIST FRACTURE SURGERY  2009    Vitals:   11/09/18 1336  BP: (!) 147/97  Pulse: 92     Subjective Assessment - 11/09/18 1325    Subjective  Pt states she initially had vertigo in 2011 when she had silent stroke per MRI:  states she has ringing and "whoosh" sound in her ears when she is having the dizziness; states the vertigo comes and goes   pt reports most recent episode of vertigo was on Mon., 11-06-18   Pertinent History  h/o silent stroke - had MRI in 2011 and 2nd MRI about 2 wks ago    Patient Stated Goals  resolve  the vertigo    Currently in Pain?  No/denies         [facility] PT Assessment - 11/10/18 0001      Assessment   Medical Diagnosis  Vertigo    Referring Provider (PT)  Dr. Burnard Bunting    Onset Date/Surgical Date  --   approx. 6 months ago   Prior Therapy  none      Precautions   Precautions  Other (comment)   intermittent dizziness     Balance Screen   Has the patient fallen in the past 6 months  No    Has the patient had a decrease in activity level because of a fear of falling?   No    Is the patient reluctant to leave their home because of a fear of falling?   No           Vestibular Assessment - 11/10/18 0001      Vestibular Assessment   General Observation  pt is a 73 yr old lady with c/o episodic vertigo      Symptom Behavior   Subjective history of current problem  pt reports she has had episodic  vertigo for past several months; states she had silent CVA in 2011 per MRI ; reports no vertigo at time of initial evaluation - pt reports some light headedness with change in position that lasts for just a few seconds; states "it's nothing that I can't deal with"     Type of Dizziness   "Funny feeling in head";Lightheadedness    Frequency of Dizziness  varies    Duration of Dizziness  seconds    Symptom Nature  Motion provoked    Aggravating Factors  Activity in general    Relieving Factors  Head stationary;Lying supine    Progression of Symptoms  Better    History of similar episodes  most recent occurrence of vertigo was on Monday, 11-06-18      Oculomotor Exam   Oculomotor Alignment  Normal    Spontaneous  Absent      Positional Testing   Dix-Hallpike  Dix-Hallpike Right;Dix-Hallpike Left    Sidelying Test  Sidelying Right;Sidelying Left      Dix-Hallpike Right   Dix-Hallpike Right Duration  none    Dix-Hallpike Right Symptoms  No nystagmus      Dix-Hallpike Left   Dix-Hallpike Left Duration  none    Dix-Hallpike Left Symptoms  No nystagmus       Sidelying Right   Sidelying Right Duration  none    Sidelying Right Symptoms  No nystagmus      Sidelying Left   Sidelying Left Duration  none    Sidelying Left Symptoms  No nystagmus      Positional Sensitivities   Supine to Sitting  Lightheadedness    Up from Right Hallpike  Lightheadedness    Up from Left Hallpike  Lightheadedness    Head Turning x 5  No dizziness    Positional Sensitivities Comments  c/o dizziness appears to related to change in BP with transitional movements      Orthostatics   BP supine (x 5 minutes)  144/90    HR supine (x 5 minutes)  81    BP sitting  119/71    HR sitting  90    BP standing (after 1 minute)  131/87    HR standing (after 1 minute)  96    BP standing (after 3 minutes)  139/85    HR standing (after 3 minutes)  94          Objective measurements completed on examination: See above findings.              PT Education - 11/10/18 1246    Education Details  pt instructed to do ankle pumps upon sitting from supine and to do marching/stepping in place in standing from sitting position to assist in BP stabilization prior to initiating movements    Person(s) Educated  Patient    Methods  Explanation    Comprehension  Verbalized understanding;Returned demonstration          PT Long Term Goals - 11/10/18 1255      PT LONG TERM GOAL #1   Title  N/A - eval only due to no vertigo at eval             Plan - 11/10/18 1250    Clinical Impression Statement  Pt is a 73yr old lady with c/o episodic vertigo and c/o light-headedness with transitional movements; no spinning vertigo reported at today's visit; pt's c/o light headedness appears to be related to changes in BP with supine to sit and with sit  to stand.  Pt states most recent episode of vertigo occurred on 11-06-18 of this week; no spinning vertigo was provoked with any positional testing in today's evaluation.    Personal Factors and Comorbidities  Time since onset of  injury/illness/exacerbation;Comorbidity 1    Examination-Activity Limitations  Locomotion Level;Bed Mobility;Transfers    Examination-Participation Restrictions  Cleaning;Community Activity;Driving    Stability/Clinical Decision Making  Stable/Uncomplicated    PT Frequency  One time visit    PT Treatment/Interventions  Patient/family education;ADLs/Self Care Home Management    PT Next Visit Plan  N/A - pt is to call if episode of vertigo occurs    Consulted and Agree with Plan of Care  Patient       Patient will benefit from skilled therapeutic intervention in order to improve the following deficits and impairments:  Dizziness  Visit Diagnosis: Dizziness and giddiness - Plan: PT plan of care cert/re-cert     Problem List Patient Active Problem List   Diagnosis Date Noted  . Pain in left hip 10/05/2016  . Pain in right hip 10/05/2016  . Trochanteric bursitis, right hip 10/05/2016  . Trochanteric bursitis, left hip 10/05/2016  . Chronic insomnia 01/28/2011  . Seasonal and perennial allergic rhinitis 05/28/2010  . Lung nodule 05/28/2010  . HYPERLIPIDEMIA 11/23/2009  . G E R D 11/23/2009  . EUSTACHIAN TUBE DYSFUNCTION 11/18/2009  . HYPERTENSION 11/18/2009  . Unspecified hypothyroidism 03/14/2007  . Obstructive sleep apnea 03/14/2007  . FIBROMYALGIA 03/14/2007    Alda Lea, PT 11/10/2018, 1:03 PM  [facility] [address] [place], [place], [address] Phone: [phone]   Fax:  [phone]  Name: [name] MRN: [id number] Date of Birth: December 01, 1945

## 2018-11-23 DIAGNOSIS — H25812 Combined forms of age-related cataract, left eye: Secondary | ICD-10-CM | POA: Diagnosis not present

## 2018-11-23 DIAGNOSIS — H2512 Age-related nuclear cataract, left eye: Secondary | ICD-10-CM | POA: Diagnosis not present

## 2018-11-29 ENCOUNTER — Other Ambulatory Visit: Payer: Self-pay | Admitting: Internal Medicine

## 2018-11-29 DIAGNOSIS — Z1231 Encounter for screening mammogram for malignant neoplasm of breast: Secondary | ICD-10-CM

## 2018-11-30 DIAGNOSIS — Z23 Encounter for immunization: Secondary | ICD-10-CM | POA: Diagnosis not present

## 2018-12-04 DIAGNOSIS — M1711 Unilateral primary osteoarthritis, right knee: Secondary | ICD-10-CM | POA: Diagnosis not present

## 2018-12-04 DIAGNOSIS — M25561 Pain in right knee: Secondary | ICD-10-CM | POA: Diagnosis not present

## 2018-12-08 DIAGNOSIS — G514 Facial myokymia: Secondary | ICD-10-CM | POA: Diagnosis not present

## 2019-01-08 ENCOUNTER — Other Ambulatory Visit: Payer: Self-pay

## 2019-01-08 ENCOUNTER — Encounter (HOSPITAL_COMMUNITY): Payer: Self-pay | Admitting: Emergency Medicine

## 2019-01-08 ENCOUNTER — Ambulatory Visit (INDEPENDENT_AMBULATORY_CARE_PROVIDER_SITE_OTHER): Payer: Medicare Other

## 2019-01-08 ENCOUNTER — Ambulatory Visit (HOSPITAL_COMMUNITY)
Admission: EM | Admit: 2019-01-08 | Discharge: 2019-01-08 | Disposition: A | Discharge status: Home / Self Care | Payer: Medicare Other | Attending: Family Medicine | Admitting: Family Medicine

## 2019-01-08 DIAGNOSIS — R0789 Other chest pain: Secondary | ICD-10-CM

## 2019-01-08 DIAGNOSIS — S299XXA Unspecified injury of thorax, initial encounter: Secondary | ICD-10-CM | POA: Diagnosis not present

## 2019-01-08 HISTORY — DX: Anxiety disorder, unspecified: F41.9

## 2019-01-08 HISTORY — DX: Fibromyalgia: M79.7

## 2019-01-08 HISTORY — DX: Depression, unspecified: F32.A

## 2019-01-08 HISTORY — DX: Cerebral infarction, unspecified: I63.9

## 2019-01-08 NOTE — Discharge Instructions (Addendum)
Take Tylenol as needed for pain Make sure to take deep breaths several times a day to keep your lungs expanded Return for any worsening pain, cough, sputum, fever
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
## 2019-01-12 ENCOUNTER — Ambulatory Visit: Payer: Medicare Other

## 2019-01-20 ENCOUNTER — Encounter (HOSPITAL_COMMUNITY): Payer: Self-pay

## 2019-01-20 ENCOUNTER — Ambulatory Visit (HOSPITAL_COMMUNITY)
Admission: EM | Admit: 2019-01-20 | Discharge: 2019-01-20 | Disposition: A | Discharge status: Home / Self Care | Payer: Medicare Other | Attending: Internal Medicine | Admitting: Internal Medicine

## 2019-01-20 DIAGNOSIS — R0789 Other chest pain: Secondary | ICD-10-CM

## 2019-01-20 MED ORDER — ACETAMINOPHEN 500 MG PO TABS: 500.0000 mg | ORAL_TABLET | Freq: Four times a day (QID) | ORAL | 0 refills | Status: DC | PRN

## 2019-01-20 MED ORDER — NAPROXEN 375 MG PO TABS: 375.0000 mg | ORAL_TABLET | Freq: Two times a day (BID) | ORAL | 0 refills | Status: DC
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
## 2019-01-22 DIAGNOSIS — E038 Other specified hypothyroidism: Secondary | ICD-10-CM | POA: Diagnosis not present

## 2019-01-22 DIAGNOSIS — E1169 Type 2 diabetes mellitus with other specified complication: Secondary | ICD-10-CM | POA: Diagnosis not present

## 2019-01-22 DIAGNOSIS — E7849 Other hyperlipidemia: Secondary | ICD-10-CM | POA: Diagnosis not present

## 2019-01-24 DIAGNOSIS — R590 Localized enlarged lymph nodes: Secondary | ICD-10-CM | POA: Diagnosis not present

## 2019-01-24 DIAGNOSIS — K1120 Sialoadenitis, unspecified: Secondary | ICD-10-CM | POA: Diagnosis not present

## 2019-01-26 DIAGNOSIS — I10 Essential (primary) hypertension: Secondary | ICD-10-CM | POA: Diagnosis not present

## 2019-01-26 DIAGNOSIS — R82998 Other abnormal findings in urine: Secondary | ICD-10-CM | POA: Diagnosis not present

## 2019-01-29 DIAGNOSIS — E785 Hyperlipidemia, unspecified: Secondary | ICD-10-CM | POA: Diagnosis not present

## 2019-01-29 DIAGNOSIS — K760 Fatty (change of) liver, not elsewhere classified: Secondary | ICD-10-CM | POA: Diagnosis not present

## 2019-01-29 DIAGNOSIS — Z0000 Encounter for general adult medical examination without abnormal findings: Secondary | ICD-10-CM | POA: Diagnosis not present

## 2019-01-29 DIAGNOSIS — I10 Essential (primary) hypertension: Secondary | ICD-10-CM | POA: Diagnosis not present

## 2019-01-29 DIAGNOSIS — I700 Atherosclerosis of aorta: Secondary | ICD-10-CM | POA: Diagnosis not present

## 2019-01-29 DIAGNOSIS — E039 Hypothyroidism, unspecified: Secondary | ICD-10-CM | POA: Diagnosis not present

## 2019-01-29 DIAGNOSIS — M797 Fibromyalgia: Secondary | ICD-10-CM | POA: Diagnosis not present

## 2019-01-29 DIAGNOSIS — E1169 Type 2 diabetes mellitus with other specified complication: Secondary | ICD-10-CM | POA: Diagnosis not present

## 2019-01-29 DIAGNOSIS — Z1212 Encounter for screening for malignant neoplasm of rectum: Secondary | ICD-10-CM | POA: Diagnosis not present

## 2019-02-14 ENCOUNTER — Ambulatory Visit: Payer: Medicare Other | Admitting: Internal Medicine

## 2019-03-01 ENCOUNTER — Other Ambulatory Visit: Payer: Self-pay

## 2019-03-01 ENCOUNTER — Ambulatory Visit
Admission: RE | Admit: 2019-03-01 | Discharge: 2019-03-01 | Disposition: A | Discharge status: Home / Self Care | Payer: Medicare Other | Source: Ambulatory Visit | Attending: Internal Medicine | Admitting: Internal Medicine

## 2019-03-01 DIAGNOSIS — Z1231 Encounter for screening mammogram for malignant neoplasm of breast: Secondary | ICD-10-CM

## 2019-03-09 ENCOUNTER — Other Ambulatory Visit: Payer: Self-pay

## 2019-03-09 ENCOUNTER — Encounter: Payer: Self-pay | Admitting: Internal Medicine

## 2019-03-09 ENCOUNTER — Ambulatory Visit (INDEPENDENT_AMBULATORY_CARE_PROVIDER_SITE_OTHER): Payer: Medicare Other | Admitting: Internal Medicine

## 2019-03-09 DIAGNOSIS — G4733 Obstructive sleep apnea (adult) (pediatric): Secondary | ICD-10-CM | POA: Diagnosis not present

## 2019-03-09 DIAGNOSIS — Z9989 Dependence on other enabling machines and devices: Secondary | ICD-10-CM | POA: Diagnosis not present

## 2019-03-09 DIAGNOSIS — I6389 Other cerebral infarction: Secondary | ICD-10-CM

## 2019-03-09 DIAGNOSIS — Z7902 Long term (current) use of antithrombotics/antiplatelets: Secondary | ICD-10-CM

## 2019-03-09 DIAGNOSIS — I639 Cerebral infarction, unspecified: Secondary | ICD-10-CM
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
## 2019-03-09 NOTE — Patient Instructions (Signed)
Glad to hear you are doing well now. We hope you have a wonderful Christmas !  We can continue CPAP auto 8-15, mask of choice, humidifier, supplies, AirView card  Please call if we can help

## 2019-03-14 ENCOUNTER — Ambulatory Visit: Payer: Medicare Other | Attending: Internal Medicine

## 2019-03-14 ENCOUNTER — Other Ambulatory Visit: Payer: Self-pay

## 2019-03-14 DIAGNOSIS — Z20822 Contact with and (suspected) exposure to covid-19: Secondary | ICD-10-CM

## 2019-03-15 ENCOUNTER — Telehealth: Payer: Self-pay | Admitting: *Deleted

## 2019-03-15 LAB — NOVEL CORONAVIRUS, NAA: SARS-CoV-2, NAA: NOT DETECTED

## 2019-03-15 NOTE — Telephone Encounter (Signed)
Patient is calling to see if COVID test results are back- patient tested due to exposure. Notified still pending. Advised quarantine until results come back, continue safe practices and contact PCP for changes.

## 2019-03-29 DIAGNOSIS — I10 Essential (primary) hypertension: Secondary | ICD-10-CM | POA: Diagnosis not present

## 2019-03-29 DIAGNOSIS — Z794 Long term (current) use of insulin: Secondary | ICD-10-CM | POA: Diagnosis not present

## 2019-03-29 DIAGNOSIS — E1169 Type 2 diabetes mellitus with other specified complication: Secondary | ICD-10-CM | POA: Diagnosis not present

## 2019-04-05 DIAGNOSIS — E785 Hyperlipidemia, unspecified: Secondary | ICD-10-CM | POA: Diagnosis not present

## 2019-04-05 DIAGNOSIS — Z1331 Encounter for screening for depression: Secondary | ICD-10-CM | POA: Diagnosis not present

## 2019-04-05 DIAGNOSIS — I10 Essential (primary) hypertension: Secondary | ICD-10-CM | POA: Diagnosis not present

## 2019-04-05 DIAGNOSIS — E1169 Type 2 diabetes mellitus with other specified complication: Secondary | ICD-10-CM | POA: Diagnosis not present

## 2019-04-05 DIAGNOSIS — I700 Atherosclerosis of aorta: Secondary | ICD-10-CM | POA: Diagnosis not present

## 2019-04-05 DIAGNOSIS — E039 Hypothyroidism, unspecified: Secondary | ICD-10-CM | POA: Diagnosis not present

## 2019-04-20 ENCOUNTER — Encounter: Payer: Self-pay | Admitting: Internal Medicine

## 2019-04-20 DIAGNOSIS — I639 Cerebral infarction, unspecified: Secondary | ICD-10-CM | POA: Insufficient documentation

## 2019-04-20 NOTE — Assessment & Plan Note (Signed)
Benefits from CPAP Plan - continue auto 8-15 

## 2019-04-20 NOTE — Assessment & Plan Note (Signed)
Patient reported possible small CVA with dizziness. Now on Plavix

## 2019-04-27 DIAGNOSIS — K589 Irritable bowel syndrome without diarrhea: Secondary | ICD-10-CM | POA: Diagnosis not present

## 2019-04-27 DIAGNOSIS — I10 Essential (primary) hypertension: Secondary | ICD-10-CM | POA: Diagnosis not present

## 2019-04-27 DIAGNOSIS — R1030 Lower abdominal pain, unspecified: Secondary | ICD-10-CM | POA: Diagnosis not present

## 2019-04-27 DIAGNOSIS — K219 Gastro-esophageal reflux disease without esophagitis: Secondary | ICD-10-CM | POA: Diagnosis not present

## 2019-07-16 DIAGNOSIS — Z20828 Contact with and (suspected) exposure to other viral communicable diseases: Secondary | ICD-10-CM | POA: Diagnosis not present

## 2019-07-16 DIAGNOSIS — Z03818 Encounter for observation for suspected exposure to other biological agents ruled out: Secondary | ICD-10-CM | POA: Diagnosis not present

## 2019-07-16 DIAGNOSIS — U071 COVID-19: Secondary | ICD-10-CM | POA: Diagnosis not present

## 2019-08-17 DIAGNOSIS — M2550 Pain in unspecified joint: Secondary | ICD-10-CM | POA: Diagnosis not present

## 2019-08-17 DIAGNOSIS — E669 Obesity, unspecified: Secondary | ICD-10-CM | POA: Diagnosis not present

## 2019-08-17 DIAGNOSIS — E1169 Type 2 diabetes mellitus with other specified complication: Secondary | ICD-10-CM | POA: Diagnosis not present

## 2019-08-17 DIAGNOSIS — Z794 Long term (current) use of insulin: Secondary | ICD-10-CM | POA: Diagnosis not present

## 2019-08-17 DIAGNOSIS — K219 Gastro-esophageal reflux disease without esophagitis: Secondary | ICD-10-CM | POA: Diagnosis not present

## 2019-08-17 DIAGNOSIS — I10 Essential (primary) hypertension: Secondary | ICD-10-CM | POA: Diagnosis not present

## 2019-08-17 DIAGNOSIS — M797 Fibromyalgia: Secondary | ICD-10-CM | POA: Diagnosis not present

## 2019-10-24 DIAGNOSIS — M25511 Pain in right shoulder: Secondary | ICD-10-CM | POA: Diagnosis not present

## 2019-11-19 ENCOUNTER — Ambulatory Visit
Admission: EM | Admit: 2019-11-19 | Discharge: 2019-11-19 | Disposition: A | Discharge status: Home / Self Care | Payer: Medicare Other | Attending: Emergency Medicine | Admitting: Emergency Medicine

## 2019-11-19 ENCOUNTER — Encounter: Payer: Self-pay | Admitting: Emergency Medicine

## 2019-11-19 ENCOUNTER — Other Ambulatory Visit: Payer: Self-pay

## 2019-11-19 DIAGNOSIS — Z20822 Contact with and (suspected) exposure to covid-19: Secondary | ICD-10-CM

## 2019-11-19 DIAGNOSIS — J019 Acute sinusitis, unspecified: Secondary | ICD-10-CM

## 2019-11-19 DIAGNOSIS — R519 Headache, unspecified: Secondary | ICD-10-CM

## 2019-11-19 MED ORDER — AMOXICILLIN-POT CLAVULANATE 875-125 MG PO TABS: 1.0000 | ORAL_TABLET | Freq: Two times a day (BID) | ORAL | 0 refills | Status: AC

## 2019-11-19 NOTE — ED Provider Notes (Signed)
[place]   [id number] 11/19/19 Arrival Time: 1227   CC: COVID symptoms  SUBJECTIVE: History from: patient.  [name] is a 74 y.o. female who presents with headache, sinus pressure, and sore throat x last week.  Denies sick exposure to COVID, flu or strep.  Has tried OTC medications with relief.  Denies aggravating factors.  Denies previous symptoms in the past.   Denies fever, chills, SOB, wheezing, chest pain, nausea, changes in bowel or bladder habits.    ROS: As per HPI.  All other pertinent ROS negative.     Past Medical History:  Diagnosis Date  . Anxiety   . Depression   . DM (diabetes mellitus) ([place]) 2013   insulin  . Eustachian tube dysfunction   . Fibromyalgia   . GERD (gastroesophageal reflux disease)   . Hyperlipidemia   . Hypertension   . Hypothyroid   . Lung nodule    CT abd by [facility] 09/29/2009  . OSA (obstructive sleep apnea)   . Osteopenia   . Stroke [facility])    Past Surgical History:  Procedure Laterality Date  . ANKLE SURGERY    . Anteriorvesicourethropexy    . BUNIONECTOMY    . COLONOSCOPY    . NASAL SINUS SURGERY    . PATELLA FRACTURE SURGERY  2009   Right  . TONSILLECTOMY    . TOTAL ABDOMINAL HYSTERECTOMY    . WRIST FRACTURE SURGERY  2009   Allergies  Allergen Reactions  . Other Other (See Comments)    NKDA   No current facility-administered medications on file prior to encounter.   Current Outpatient Medications on File Prior to Encounter  Medication Sig Dispense Refill  . ACCU-CHEK SMARTVIEW test strip USE TO CHECK BLOOD SUGAR 3 TO 4 TIMES DAILY  11  . acetaminophen (TYLENOL) 500 MG tablet Take 1 tablet (500 mg total) by mouth every 6 (six) hours as needed. 30 tablet 0  . aspirin 81 MG chewable tablet Chew 81 mg by mouth daily.    . clopidogrel (PLAVIX) 75 MG tablet Take 75 mg by mouth daily.    . DULoxetine (CYMBALTA) 60 MG capsule Take 60 mg by mouth daily.    . Exenatide ER (BYDUREON) 2 MG PEN Inject  2 mg into the skin once a week.    Marland Kitchen HUMALOG KWIKPEN 100 UNIT/ML KwikPen     . insulin aspart (NOVOLOG) 100 UNIT/ML injection Inject 5 Units into the skin 3 (three) times daily with meals.    . Insulin Glargine (TOUJEO SOLOSTAR) 300 UNIT/ML SOPN Inject 30 Units into the skin daily.    . irbesartan (AVAPRO) 300 MG tablet Take 300 mg by mouth daily.    . irbesartan-hydrochlorothiazide (AVALIDE) 300-12.5 MG tablet irbesartan 300 mg-hydrochlorothiazide 12.5 mg tablet    . magnesium gluconate (MAGONATE) 500 MG tablet Take 500 mg by mouth daily.    . metoprolol succinate (TOPROL-XL) 25 MG 24 hr tablet     . mirabegron ER (MYRBETRIQ) 25 MG TB24 tablet Myrbetriq 25 mg tablet,extended release    . Multiple Vitamins-Minerals (MULTIVITAMIN WOMEN PO) Take 1 tablet by mouth daily. Pt takes Alive    . naproxen (NAPROSYN) 375 MG tablet Take 1 tablet (375 mg total) by mouth 2 (two) times daily. 20 tablet 0  . rosuvastatin (CRESTOR) 20 MG tablet Take 20 mg by mouth at bedtime.    . simvastatin (ZOCOR) 20 MG tablet Take 1 tablet by mouth daily.     Social History  Socioeconomic History  . Marital status: Married    Spouse name: Not on file  . Number of children: 2  . Years of education: Not on file  . Highest education level: Not on file  Occupational History  . Occupation: Disability  Tobacco Use  . Smoking status: Never Smoker  . Smokeless tobacco: Never Used  Substance and Sexual Activity  . Alcohol use: No  . Drug use: No  . Sexual activity: Not on file  Other Topics Concern  . Not on file  Social History Narrative   No etoh   No street drug use   No risk for hiv   Social Determinants of Health   Financial Resource Strain:   . Difficulty of Paying Living Expenses: Not on file  Food Insecurity:   . Worried About Charity fundraiser in the Last Year: Not on file  . Ran Out of Food in the Last Year: Not on file  Transportation Needs:   . Lack of Transportation (Medical): Not on file  .  Lack of Transportation (Non-Medical): Not on file  Physical Activity:   . Days of Exercise per Week: Not on file  . Minutes of Exercise per Session: Not on file  Stress:   . Feeling of Stress : Not on file  Social Connections:   . Frequency of Communication with Friends and Family: Not on file  . Frequency of Social Gatherings with Friends and Family: Not on file  . Attends Religious Services: Not on file  . Active Member of Clubs or Organizations: Not on file  . Attends Archivist Meetings: Not on file  . Marital Status: Not on file  Intimate Partner Violence:   . Fear of Current or Ex-Partner: Not on file  . Emotionally Abused: Not on file  . Physically Abused: Not on file  . Sexually Abused: Not on file   Family History  Problem Relation Age of Onset  . Heart attack Mother   . Colon cancer Father     OBJECTIVE:  Vitals:   11/19/19 1319 11/19/19 1320  BP:  122/82  Pulse:  73  Resp:  17  Temp:  98.1 F (36.7 C)  TempSrc:  Oral  SpO2:  96%  Weight: 200 lb (90.7 kg)   Height: 5\' 3"  (1.6 m)     General appearance: alert; appears mildly fatigued, but nontoxic; speaking in full sentences and tolerating own secretions HEENT: NCAT; Ears: EACs clear, TMs pearly gray; Eyes: PERRL.  EOM grossly intact. Sinuses: TTP; Nose: nares patent without rhinorrhea, Throat: oropharynx clear, tonsils non erythematous or enlarged, uvula midline  Neck: supple without LAD Lungs: unlabored respirations, symmetrical air entry; cough: absent; no respiratory distress; CTAB Heart: regular rate and rhythm.   Skin: warm and dry Psychological: alert and cooperative; normal mood and affect  ASSESSMENT & PLAN:  1. Nonintractable headache, unspecified chronicity pattern, unspecified headache type   2. Acute non-recurrent sinusitis, unspecified location   3. Suspected COVID-19 virus infection     Meds ordered this encounter  Medications  . amoxicillin-clavulanate (AUGMENTIN) 875-125 MG  tablet    Sig: Take 1 tablet by mouth every 12 (twelve) hours for 10 days.    Dispense:  20 tablet    Refill:  0    Order Specific Question:   Supervising Provider    Answer:   Raylene Everts [[id number]]    COVID testing ordered.  It will take between 5-7 days for test results.  Someone will  contact you regarding abnormal results.    In the meantime: You should remain isolated in your home for 10 days from symptom onset AND greater than 72 hours after symptoms resolution (absence of fever without the use of fever-reducing medication and improvement in respiratory symptoms), whichever is longer Get plenty of rest and push fluids Augmentin prescribed.  Take as directed and to completion Use OTC zyrtec for nasal congestion, runny nose, and/or sore throat Use OTC flonase for nasal congestion and runny nose Use medications daily for symptom relief Use OTC medications like ibuprofen or tylenol as needed fever or pain Call or go to the ED if you have any new or worsening symptoms such as fever, cough, shortness of breath, chest tightness, chest pain, turning blue, changes in mental status, etc...   Reviewed expectations re: course of current medical issues. Questions answered. Outlined signs and symptoms indicating need for more acute intervention. Patient verbalized understanding. After Visit Summary given.         Lestine Box, PA-C 11/19/19 1401

## 2019-11-19 NOTE — Discharge Instructions (Signed)
COVID testing ordered.  It will take between 5-7 days for test results.  Someone will contact you regarding abnormal results.    In the meantime: You should remain isolated in your home for 10 days from symptom onset AND greater than 72 hours after symptoms resolution (absence of fever without the use of fever-reducing medication and improvement in respiratory symptoms), whichever is longer Get plenty of rest and push fluids augmentin prescribed.  Take as directed and to completion Use OTC zyrtec for nasal congestion, runny nose, and/or sore throat Use OTC flonase for nasal congestion and runny nose Use medications daily for symptom relief Use OTC medications like ibuprofen or tylenol as needed fever or pain Call or go to the ED if you have any new or worsening symptoms such as fever, cough, shortness of breath, chest tightness, chest pain, turning blue, changes in mental status, etc..Marland Kitchen

## 2019-11-19 NOTE — ED Triage Notes (Signed)
Headache, sinus pressure and sore throat since last week.  Has not had covid vaccine

## 2019-11-20 LAB — NOVEL CORONAVIRUS, NAA: SARS-CoV-2, NAA: NOT DETECTED

## 2019-11-22 DIAGNOSIS — M25511 Pain in right shoulder: Secondary | ICD-10-CM | POA: Diagnosis not present

## 2019-11-23 DIAGNOSIS — Z23 Encounter for immunization: Secondary | ICD-10-CM | POA: Diagnosis not present
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
## 2019-12-07 DIAGNOSIS — M25511 Pain in right shoulder: Secondary | ICD-10-CM | POA: Diagnosis not present

## 2019-12-13 DIAGNOSIS — Z23 Encounter for immunization: Secondary | ICD-10-CM | POA: Diagnosis not present

## 2019-12-13 DIAGNOSIS — Z794 Long term (current) use of insulin: Secondary | ICD-10-CM | POA: Diagnosis not present

## 2019-12-13 DIAGNOSIS — E1169 Type 2 diabetes mellitus with other specified complication: Secondary | ICD-10-CM | POA: Diagnosis not present

## 2019-12-13 DIAGNOSIS — I10 Essential (primary) hypertension: Secondary | ICD-10-CM | POA: Diagnosis not present

## 2019-12-14 DIAGNOSIS — Z23 Encounter for immunization: Secondary | ICD-10-CM | POA: Diagnosis not present

## 2019-12-19 DIAGNOSIS — M7541 Impingement syndrome of right shoulder: Secondary | ICD-10-CM | POA: Diagnosis not present

## 2019-12-19 DIAGNOSIS — M25511 Pain in right shoulder: Secondary | ICD-10-CM | POA: Diagnosis not present

## 2019-12-24 DIAGNOSIS — H26493 Other secondary cataract, bilateral: Secondary | ICD-10-CM | POA: Diagnosis not present

## 2019-12-24 DIAGNOSIS — E119 Type 2 diabetes mellitus without complications: Secondary | ICD-10-CM | POA: Diagnosis not present

## 2019-12-24 DIAGNOSIS — H5201 Hypermetropia, right eye: Secondary | ICD-10-CM | POA: Diagnosis not present

## 2019-12-24 DIAGNOSIS — H35371 Puckering of macula, right eye: Secondary | ICD-10-CM | POA: Diagnosis not present

## 2019-12-28 DIAGNOSIS — M24111 Other articular cartilage disorders, right shoulder: Secondary | ICD-10-CM | POA: Diagnosis not present

## 2019-12-28 DIAGNOSIS — M75121 Complete rotator cuff tear or rupture of right shoulder, not specified as traumatic: Secondary | ICD-10-CM | POA: Diagnosis not present

## 2019-12-28 DIAGNOSIS — M19011 Primary osteoarthritis, right shoulder: Secondary | ICD-10-CM | POA: Diagnosis not present

## 2019-12-28 DIAGNOSIS — S43431A Superior glenoid labrum lesion of right shoulder, initial encounter: Secondary | ICD-10-CM | POA: Diagnosis not present

## 2019-12-28 DIAGNOSIS — M659 Synovitis and tenosynovitis, unspecified: Secondary | ICD-10-CM | POA: Diagnosis not present

## 2019-12-28 DIAGNOSIS — G8918 Other acute postprocedural pain: Secondary | ICD-10-CM | POA: Diagnosis not present

## 2019-12-28 DIAGNOSIS — M75111 Incomplete rotator cuff tear or rupture of right shoulder, not specified as traumatic: Secondary | ICD-10-CM | POA: Diagnosis not present

## 2019-12-28 DIAGNOSIS — M65811 Other synovitis and tenosynovitis, right shoulder: Secondary | ICD-10-CM | POA: Diagnosis not present

## 2019-12-28 DIAGNOSIS — M7541 Impingement syndrome of right shoulder: Secondary | ICD-10-CM | POA: Diagnosis not present

## 2020-01-07 DIAGNOSIS — M75121 Complete rotator cuff tear or rupture of right shoulder, not specified as traumatic: Secondary | ICD-10-CM | POA: Diagnosis not present

## 2020-01-14 DIAGNOSIS — M75121 Complete rotator cuff tear or rupture of right shoulder, not specified as traumatic: Secondary | ICD-10-CM | POA: Diagnosis not present

## 2020-01-21 DIAGNOSIS — M75121 Complete rotator cuff tear or rupture of right shoulder, not specified as traumatic: Secondary | ICD-10-CM | POA: Diagnosis not present

## 2020-01-22 ENCOUNTER — Ambulatory Visit
Admission: EM | Admit: 2020-01-22 | Discharge: 2020-01-22 | Disposition: A | Discharge status: Home / Self Care | Payer: Medicare Other | Attending: Emergency Medicine | Admitting: Emergency Medicine

## 2020-01-22 DIAGNOSIS — H60392 Other infective otitis externa, left ear: Secondary | ICD-10-CM

## 2020-01-22 DIAGNOSIS — H65191 Other acute nonsuppurative otitis media, right ear: Secondary | ICD-10-CM

## 2020-01-22 MED ORDER — NEOMYCIN-POLYMYXIN-HC 3.5-10000-1 OT SUSP: 4.0000 [drp] | Freq: Three times a day (TID) | OTIC | 0 refills | Status: DC

## 2020-01-22 MED ORDER — FLUTICASONE PROPIONATE 50 MCG/ACT NA SUSP: 1.0000 | Freq: Every day | NASAL | 0 refills | Status: DC

## 2020-01-22 NOTE — ED Triage Notes (Signed)
Pt presents with left ear discomfort for past 2 weeks

## 2020-01-22 NOTE — Discharge Instructions (Addendum)
Rest and drink plenty of fluids Prescribed Flonase Prescribed Cortisporin for left otitis externa Take medications as directed and to completion Continue to use OTC ibuprofen and/ or tylenol as needed for pain control Follow up with PCP if symptoms persists Return here or go to the ER if you have any new or worsening symptoms

## 2020-01-22 NOTE — ED Provider Notes (Signed)
[place]   [id number] 01/22/20 Arrival Time: 5170  CC:EAR PAIN  SUBJECTIVE: History from: patient and family.  [name] is a 74 y.o. female who presents with of left ear pain for the past 2 weeks.  Denies a precipitating event, such as swimming or wearing ear plugs.  Patient states the pain is constant and achy in character.  Patient has tried OTC medication without relief.  Symptoms are made worse with lying down. Denies similar symptoms in the past.   Denies fever, chills, fatigue, sinus pain, rhinorrhea, ear discharge, sore throat, SOB, wheezing, chest pain, nausea, changes in bowel or bladder habits.    ROS: As per HPI.  All other pertinent ROS negative.      Past Medical History:  Diagnosis Date   Anxiety    Depression    DM (diabetes mellitus) ([place]) 2013   insulin   Eustachian tube dysfunction    Fibromyalgia    GERD (gastroesophageal reflux disease)    Hyperlipidemia    Hypertension    Hypothyroid    Lung nodule    CT abd by [facility] 09/29/2009   OSA (obstructive sleep apnea)    Osteopenia    Stroke [facility])    Past Surgical History:  Procedure Laterality Date   ANKLE SURGERY     Anteriorvesicourethropexy     BUNIONECTOMY     COLONOSCOPY     NASAL SINUS SURGERY     PATELLA FRACTURE SURGERY  2009   Right   TONSILLECTOMY     TOTAL ABDOMINAL HYSTERECTOMY     WRIST FRACTURE SURGERY  2009   Allergies  Allergen Reactions   Other Other (See Comments)    NKDA   No current facility-administered medications on file prior to encounter.   Current Outpatient Medications on File Prior to Encounter  Medication Sig Dispense Refill   ACCU-CHEK SMARTVIEW test strip USE TO CHECK BLOOD SUGAR 3 TO 4 TIMES DAILY  11   acetaminophen (TYLENOL) 500 MG tablet Take 1 tablet (500 mg total) by mouth every 6 (six) hours as needed. 30 tablet 0   aspirin 81 MG chewable tablet Chew 81 mg by mouth daily.     clopidogrel (PLAVIX)  75 MG tablet Take 75 mg by mouth daily.     DULoxetine (CYMBALTA) 60 MG capsule Take 60 mg by mouth daily.     Exenatide ER (BYDUREON) 2 MG PEN Inject 2 mg into the skin once a week.     HUMALOG KWIKPEN 100 UNIT/ML KwikPen      insulin aspart (NOVOLOG) 100 UNIT/ML injection Inject 5 Units into the skin 3 (three) times daily with meals.     Insulin Glargine (TOUJEO SOLOSTAR) 300 UNIT/ML SOPN Inject 30 Units into the skin daily.     irbesartan (AVAPRO) 300 MG tablet Take 300 mg by mouth daily.     irbesartan-hydrochlorothiazide (AVALIDE) 300-12.5 MG tablet irbesartan 300 mg-hydrochlorothiazide 12.5 mg tablet     magnesium gluconate (MAGONATE) 500 MG tablet Take 500 mg by mouth daily.     metoprolol succinate (TOPROL-XL) 25 MG 24 hr tablet      mirabegron ER (MYRBETRIQ) 25 MG TB24 tablet Myrbetriq 25 mg tablet,extended release     Multiple Vitamins-Minerals (MULTIVITAMIN WOMEN PO) Take 1 tablet by mouth daily. Pt takes Alive     naproxen (NAPROSYN) 375 MG tablet Take 1 tablet (375 mg total) by mouth 2 (two) times daily. 20 tablet 0   rosuvastatin (CRESTOR) 20 MG tablet Take 20 mg  by mouth at bedtime.     simvastatin (ZOCOR) 20 MG tablet Take 1 tablet by mouth daily.     Social History   Socioeconomic History   Marital status: Married    Spouse name: Not on file   Number of children: 2   Years of education: Not on file   Highest education level: Not on file  Occupational History   Occupation: Disability  Tobacco Use   Smoking status: Never Smoker   Smokeless tobacco: Never Used  Substance and Sexual Activity   Alcohol use: No   Drug use: No   Sexual activity: Not on file  Other Topics Concern   Not on file  Social History Narrative   No etoh   No street drug use   No risk for hiv   Social Determinants of Health   Financial Resource Strain:    Difficulty of Paying Living Expenses: Not on file  Food Insecurity:    Worried About [place] in  the Last Year: Not on file   Ran Out of Food in the Last Year: Not on file  Transportation Needs:    Lack of Transportation (Medical): Not on file   Lack of Transportation (Non-Medical): Not on file  Physical Activity:    Days of Exercise per Week: Not on file   Minutes of Exercise per Session: Not on file  Stress:    Feeling of Stress : Not on file  Social Connections:    Frequency of Communication with Friends and Family: Not on file   Frequency of Social Gatherings with Friends and Family: Not on file   Attends Religious Services: Not on file   Active Member of Clubs or Organizations: Not on file   Attends Archivist Meetings: Not on file   Marital Status: Not on file  Intimate Partner Violence:    Fear of Current or Ex-Partner: Not on file   Emotionally Abused: Not on file   Physically Abused: Not on file   Sexually Abused: Not on file   Family History  Problem Relation Age of Onset   Heart attack Mother    Colon cancer Father     OBJECTIVE:  Vitals:   01/22/20 0826  BP: 121/66  Pulse: 75  Resp: 16  Temp: 98.2 F (36.8 C)  SpO2: 98%     Physical Exam Vitals and nursing note reviewed.  Constitutional:      General: She is not in acute distress.    Appearance: Normal appearance. She is normal weight. She is not ill-appearing, toxic-appearing or diaphoretic.  HENT:     Head: Normocephalic.     Right Ear: Ear canal and external ear normal. A middle ear effusion is present. There is no impacted cerumen.     Left Ear: Tympanic membrane and external ear normal. Tenderness present. There is no impacted cerumen.     Ears:     Comments: Left ear: Pain and tenderness when pulling ear helix, ear canal red and irritated Cardiovascular:     Rate and Rhythm: Normal rate and regular rhythm.     Pulses: Normal pulses.     Heart sounds: Normal heart sounds. No murmur heard.  No friction rub. No gallop.   Pulmonary:     Effort: Pulmonary effort  is normal. No respiratory distress.     Breath sounds: Normal breath sounds. No stridor. No wheezing, rhonchi or rales.  Chest:     Chest wall: No tenderness.  Neurological:  Mental Status: She is alert and oriented to person, place, and time.     Imaging: No results found.   ASSESSMENT & PLAN:  1. Infective otitis externa of left ear   2. Acute middle ear effusion, right     Meds ordered this encounter  Medications   fluticasone (FLONASE) 50 MCG/ACT nasal spray    Sig: Place 1 spray into both nostrils daily for 14 days.    Dispense:  16 g    Refill:  0   neomycin-polymyxin-hydrocortisone (CORTISPORIN) 3.5-10000-1 OTIC suspension    Sig: Place 4 drops into the left ear 3 (three) times daily.    Dispense:  10 mL    Refill:  0   Discharge instructions  Rest and drink plenty of fluids Prescribed Flonase Prescribed Cortisporin for left otitis externa Take medications as directed and to completion Continue to use OTC ibuprofen and/ or tylenol as needed for pain control Follow up with PCP if symptoms persists Return here or go to the ER if you have any new or worsening symptoms   Reviewed expectations re: course of current medical issues. Questions answered. Outlined signs and symptoms indicating need for more acute intervention. Patient verbalized understanding. After Visit Summary given.         Emerson Monte, Rachel 01/22/20 [phone]

## 2020-01-24 DIAGNOSIS — F329 Major depressive disorder, single episode, unspecified: Secondary | ICD-10-CM | POA: Diagnosis not present

## 2020-01-24 DIAGNOSIS — E1169 Type 2 diabetes mellitus with other specified complication: Secondary | ICD-10-CM | POA: Diagnosis not present

## 2020-01-24 DIAGNOSIS — I10 Essential (primary) hypertension: Secondary | ICD-10-CM | POA: Diagnosis not present

## 2020-01-24 DIAGNOSIS — E669 Obesity, unspecified: Secondary | ICD-10-CM | POA: Diagnosis not present

## 2020-01-24 DIAGNOSIS — L0292 Furuncle, unspecified: Secondary | ICD-10-CM | POA: Diagnosis not present

## 2020-01-24 DIAGNOSIS — R432 Parageusia: Secondary | ICD-10-CM | POA: Diagnosis not present

## 2020-01-28 DIAGNOSIS — M75121 Complete rotator cuff tear or rupture of right shoulder, not specified as traumatic: Secondary | ICD-10-CM | POA: Diagnosis not present

## 2020-01-29 ENCOUNTER — Other Ambulatory Visit: Payer: Self-pay | Admitting: Internal Medicine

## 2020-01-29 DIAGNOSIS — Z1231 Encounter for screening mammogram for malignant neoplasm of breast: Secondary | ICD-10-CM

## 2020-02-04 DIAGNOSIS — M75121 Complete rotator cuff tear or rupture of right shoulder, not specified as traumatic: Secondary | ICD-10-CM | POA: Diagnosis not present

## 2020-02-06 DIAGNOSIS — M75121 Complete rotator cuff tear or rupture of right shoulder, not specified as traumatic: Secondary | ICD-10-CM | POA: Diagnosis not present

## 2020-02-11 ENCOUNTER — Telehealth: Payer: Self-pay | Admitting: Internal Medicine

## 2020-02-11 DIAGNOSIS — G4733 Obstructive sleep apnea (adult) (pediatric): Secondary | ICD-10-CM

## 2020-02-11 DIAGNOSIS — M75121 Complete rotator cuff tear or rupture of right shoulder, not specified as traumatic: Secondary | ICD-10-CM | POA: Diagnosis not present

## 2020-02-11 NOTE — Telephone Encounter (Signed)
Spoke with the pt and notified will send order for her CPAP supplies to [facility]. Order sent and nothing further needed.

## 2020-02-13 DIAGNOSIS — M75121 Complete rotator cuff tear or rupture of right shoulder, not specified as traumatic: Secondary | ICD-10-CM | POA: Diagnosis not present

## 2020-02-20 DIAGNOSIS — M75121 Complete rotator cuff tear or rupture of right shoulder, not specified as traumatic: Secondary | ICD-10-CM | POA: Diagnosis not present

## 2020-02-22 DIAGNOSIS — M75121 Complete rotator cuff tear or rupture of right shoulder, not specified as traumatic: Secondary | ICD-10-CM | POA: Diagnosis not present

## 2020-02-27 DIAGNOSIS — M75121 Complete rotator cuff tear or rupture of right shoulder, not specified as traumatic: Secondary | ICD-10-CM | POA: Diagnosis not present

## 2020-02-29 DIAGNOSIS — M75121 Complete rotator cuff tear or rupture of right shoulder, not specified as traumatic: Secondary | ICD-10-CM | POA: Diagnosis not present

## 2020-03-07 DIAGNOSIS — E039 Hypothyroidism, unspecified: Secondary | ICD-10-CM | POA: Diagnosis not present

## 2020-03-07 DIAGNOSIS — E785 Hyperlipidemia, unspecified: Secondary | ICD-10-CM | POA: Diagnosis not present

## 2020-03-07 DIAGNOSIS — E1169 Type 2 diabetes mellitus with other specified complication: Secondary | ICD-10-CM | POA: Diagnosis not present

## 2020-03-10 ENCOUNTER — Ambulatory Visit: Payer: Medicare Other | Admitting: Internal Medicine

## 2020-03-11 ENCOUNTER — Ambulatory Visit
Admission: RE | Admit: 2020-03-11 | Discharge: 2020-03-11 | Disposition: A | Discharge status: Home / Self Care | Payer: Medicare Other | Source: Ambulatory Visit | Attending: Internal Medicine | Admitting: Internal Medicine

## 2020-03-11 ENCOUNTER — Other Ambulatory Visit: Payer: Self-pay

## 2020-03-11 DIAGNOSIS — Z1231 Encounter for screening mammogram for malignant neoplasm of breast: Secondary | ICD-10-CM | POA: Diagnosis not present

## 2020-03-12 DIAGNOSIS — Z794 Long term (current) use of insulin: Secondary | ICD-10-CM | POA: Diagnosis not present

## 2020-03-12 DIAGNOSIS — J984 Other disorders of lung: Secondary | ICD-10-CM | POA: Diagnosis not present

## 2020-03-12 DIAGNOSIS — E669 Obesity, unspecified: Secondary | ICD-10-CM | POA: Diagnosis not present

## 2020-03-12 DIAGNOSIS — I700 Atherosclerosis of aorta: Secondary | ICD-10-CM | POA: Diagnosis not present

## 2020-03-12 DIAGNOSIS — Z0000 Encounter for general adult medical examination without abnormal findings: Secondary | ICD-10-CM | POA: Diagnosis not present

## 2020-03-12 DIAGNOSIS — I10 Essential (primary) hypertension: Secondary | ICD-10-CM | POA: Diagnosis not present

## 2020-03-12 DIAGNOSIS — R82998 Other abnormal findings in urine: Secondary | ICD-10-CM | POA: Diagnosis not present

## 2020-03-12 DIAGNOSIS — E1169 Type 2 diabetes mellitus with other specified complication: Secondary | ICD-10-CM | POA: Diagnosis not present

## 2020-03-12 DIAGNOSIS — E039 Hypothyroidism, unspecified: Secondary | ICD-10-CM | POA: Diagnosis not present

## 2020-03-18 DIAGNOSIS — R0602 Shortness of breath: Secondary | ICD-10-CM | POA: Diagnosis not present

## 2020-03-18 DIAGNOSIS — E785 Hyperlipidemia, unspecified: Secondary | ICD-10-CM | POA: Diagnosis not present

## 2020-03-18 DIAGNOSIS — I10 Essential (primary) hypertension: Secondary | ICD-10-CM | POA: Diagnosis not present

## 2020-03-18 DIAGNOSIS — E119 Type 2 diabetes mellitus without complications: Secondary | ICD-10-CM | POA: Diagnosis not present

## 2020-03-18 DIAGNOSIS — I700 Atherosclerosis of aorta: Secondary | ICD-10-CM | POA: Diagnosis not present

## 2020-03-24 ENCOUNTER — Encounter: Payer: Self-pay | Admitting: Cardiology

## 2020-03-24 NOTE — Progress Notes (Signed)
Cardiology Office Note  Date: 03/25/2020   ID: [name], DOB 04/06/1945, MRN [id number]  PCP:  Geoffry Paradise, MD  Cardiologist:  Nona Dell, MD Electrophysiologist:  None   Chief Complaint  Patient presents with  . Shortness of Breath    History of Present Illness: [name] is a 75 y.o. female referred for cardiology consultation by Dr. Jacky Kindle for evaluation of shortness of breath.  She presents today with her daughter, her husband is also a patient of mine, recently status post CABG.  She tells me that she has been experiencing dyspnea on exertion for at least the last 6 months, describes NYHA class II-III symptoms.  No definite exertional chest tightness, she sometimes feels a "ping" in her chest with activities.  She became more concerned about her symptoms recently when she was having to walk from the parking deck into the hospital to visit her husband.  She states that she had to stop due to the degree of shortness of breath she was experiencing.  She does not describe any palpitations or syncope.  Medical history is reviewed below.  She states that she has been compliant with her medications.  She did have a recent physical with Dr. Jacky Kindle, we are requesting her recent lab work for review.  She states that she underwent a GXT several years ago for screening purposes, does not recall any significant concerns.  She has trouble with bilateral knee pain and does have some difficulty ambulating at this time.  I personally reviewed her ECG today which shows sinus rhythm with prolonged PR interval, decreased R wave progression, nonspecific ST segment changes.  Past Medical History:  Diagnosis Date  . Anxiety   . Depression   . Essential hypertension   . Eustachian tube dysfunction   . Fibromyalgia   . GERD (gastroesophageal reflux disease)   . Hyperlipidemia   . Hypothyroid   . OSA (obstructive sleep apnea)   . Osteopenia   . Stroke ([facility])   .  Type 2 diabetes mellitus ([facility])     Past Surgical History:  Procedure Laterality Date  . ANKLE SURGERY    . Anteriorvesicourethropexy    . BUNIONECTOMY    . COLONOSCOPY    . NASAL SINUS SURGERY    . PATELLA FRACTURE SURGERY  2009   Right  . TONSILLECTOMY    . TOTAL ABDOMINAL HYSTERECTOMY    . WRIST FRACTURE SURGERY  2009    Current Outpatient Medications  Medication Sig Dispense Refill  . acetaminophen (TYLENOL) 500 MG tablet Take 1 tablet (500 mg total) by mouth every 6 (six) hours as needed. 30 tablet 0  . aspirin 81 MG chewable tablet Chew 81 mg by mouth daily.    . insulin lispro (HUMALOG) 100 UNIT/ML injection Inject 8-10 Units into the skin 3 (three) times daily before meals.    . irbesartan (AVAPRO) 300 MG tablet Take 300 mg by mouth daily.    Marland Kitchen omeprazole (PRILOSEC) 20 MG capsule Take 20 mg by mouth daily.    . rosuvastatin (CRESTOR) 20 MG tablet Take 20 mg by mouth at bedtime.    . triamterene-hydrochlorothiazide (MAXZIDE-25) 37.5-25 MG tablet daily.     No current facility-administered medications for this visit.   Allergies:  Other   Social History: The patient  reports that she has never smoked. She has never used smokeless tobacco. She reports that she does not drink alcohol and does not use drugs.   Family History: The patient's  family history includes Colon cancer in her father; Heart attack in her mother.   ROS: Chronic knee pain.  Physical Exam: VS:  BP 124/80 (BP Location: Left Arm, Patient Position: Sitting, Cuff Size: Normal)   Pulse 75   Ht 5\' 3"  (1.6 m)   Wt 207 lb 2 oz (94 kg)   SpO2 98%   BMI 36.69 kg/m , BMI Body mass index is 36.69 kg/m.  Wt Readings from Last 3 Encounters:  03/25/20 207 lb 2 oz (94 kg)  11/19/19 200 lb (90.7 kg)  05/27/18 200 lb (90.7 kg)    General: Patient appears comfortable at rest. HEENT: Conjunctiva and lids normal, wearing a mask. Neck: Supple, no elevated JVP or carotid bruits, no thyromegaly. Lungs: Clear to  auscultation, nonlabored breathing at rest. Cardiac: Regular rate and rhythm, no S3, 2/6 systolic murmur, no pericardial rub. Abdomen: Soft, nontender, no hepatomegaly, bowel sounds present. Extremities: Trace ankle edema, distal pulses 2+. Skin: Warm and dry. Musculoskeletal: No kyphosis. Neuropsychiatric: Alert and oriented x3, affect grossly appropriate.  ECG:  No old tracings for review today.  Recent Labwork:  No recent lab work for review today.  Other Studies Reviewed Today:  Carotid Dopplers 10/31/2018: Summary:  Right Carotid: Velocities in the right ICA are consistent with a 1-39%  stenosis.   Left Carotid: The extracranial vessels were near-normal with only minimal  wall        thickening or plaque.   Vertebrals: Bilateral vertebral arteries demonstrate antegrade flow.  Subclavians: Normal flow hemodynamics were seen in bilateral subclavian        arteries.   Assessment and Plan:  1.  Progressive dyspnea on exertion and recurrent atypical chest discomfort in a 75 year old woman with history of type 2 diabetes mellitus, previous stroke, hypertension, OSA, and hyperlipidemia.  She is on reasonable medical regimen including aspirin, Avapro, Maxide, and Crestor.  Blood pressure well controlled today.  ECG abnormal but nonspecific.  Systolic murmur noted on examination.  Plan is to obtain an echocardiogram and a Craig for further cardiac structural and ischemic evaluation.  2.  Essential hypertension, blood pressure well controlled today on Avapro and Maxide.  3.  Mixed hyperlipidemia, on Crestor.  Requesting recent lab work from PCP for review.  Medication Adjustments/Labs and Tests Ordered: Current medicines are reviewed at length with the patient today.  Concerns regarding medicines are outlined above.   Tests Ordered: Orders Placed This Encounter  Procedures  . NM Myocar Single W/Spect W/Wall Motion And EF  . EKG 12-Lead  .  ECHOCARDIOGRAM COMPLETE    Medication Changes: No orders of the defined types were placed in this encounter.   Disposition:  Follow up test results.  Signed, Satira Sark, MD, [facility] 03/25/2020 1:56 PM    [place] [facility] [address] [place], [place] [address] Phone: [phone]; Fax: [phone]

## 2020-03-25 ENCOUNTER — Ambulatory Visit: Payer: Medicare Other | Admitting: Cardiology

## 2020-03-25 ENCOUNTER — Encounter: Payer: Self-pay | Admitting: *Deleted

## 2020-03-25 ENCOUNTER — Ambulatory Visit: Payer: Medicare Other | Admitting: Internal Medicine

## 2020-03-25 ENCOUNTER — Encounter: Payer: Self-pay | Admitting: Cardiology

## 2020-03-25 ENCOUNTER — Other Ambulatory Visit: Payer: Self-pay

## 2020-03-25 VITALS — BP 124/80 | HR 75 | Ht 63.0 in | Wt 207.1 lb

## 2020-03-25 DIAGNOSIS — I10 Essential (primary) hypertension: Secondary | ICD-10-CM | POA: Diagnosis not present

## 2020-03-25 DIAGNOSIS — R0602 Shortness of breath: Secondary | ICD-10-CM

## 2020-03-25 DIAGNOSIS — R9431 Abnormal electrocardiogram [ECG] [EKG]: Secondary | ICD-10-CM | POA: Diagnosis not present

## 2020-03-25 DIAGNOSIS — R0600 Dyspnea, unspecified: Secondary | ICD-10-CM

## 2020-03-25 DIAGNOSIS — R011 Cardiac murmur, unspecified: Secondary | ICD-10-CM

## 2020-03-25 DIAGNOSIS — R0609 Other forms of dyspnea: Secondary | ICD-10-CM

## 2020-03-25 DIAGNOSIS — E782 Mixed hyperlipidemia: Secondary | ICD-10-CM

## 2020-03-25 NOTE — Patient Instructions (Signed)
Medication Instructions:  Your physician recommends that you continue on your current medications as directed. Please refer to the Current Medication list given to you today.  *If you need a refill on your cardiac medications before your next appointment, please call your pharmacy*   Lab Work: NONE   If you have labs (blood work) drawn today and your tests are completely normal, you will receive your results only by: Marland Kitchen MyChart Message (if you have MyChart) OR . A paper copy in the mail If you have any lab test that is abnormal or we need to change your treatment, we will call you to review the results.   Testing/Procedures: Your physician has requested that you have an echocardiogram. Echocardiography is a painless test that uses sound waves to create images of your heart. It provides your doctor with information about the size and shape of your heart and how well your heart's chambers and valves are working. This procedure takes approximately one hour. There are no restrictions for this procedure.  Your physician has requested that you have a lexiscan myoview. For further information please visit [email or web address] Please follow instruction sheet, as given.    Follow-Up: At [facility], you and your health needs are our priority.  As part of our continuing mission to provide you with exceptional heart care, we have created designated Provider Care Teams.  These Care Teams include your primary Cardiologist (physician) and Advanced Practice Providers (APPs -  Physician Assistants and Nurse Practitioners) who all work together to provide you with the care you need, when you need it.  We recommend signing up for the patient portal called "MyChart".  Sign up information is provided on this After Visit Summary.  MyChart is used to connect with patients for Virtual Visits (Telemedicine).  Patients are able to view lab/test results, encounter notes, upcoming appointments, etc.  Non-urgent  messages can be sent to your provider as well.   To learn more about what you can do with MyChart, go to [email or web address].    Your next appointment:    After Testing   The format for your next appointment:   In Person  Provider:   You may see Nona Dell, MD or one of the following Advanced Practice Providers on your designated Care Team:    Randall An, PA-C   Jacolyn Reedy, PA-C     Other Instructions Thank you for choosing  [facility]!

## 2020-04-02 ENCOUNTER — Ambulatory Visit (HOSPITAL_COMMUNITY)
Admission: RE | Admit: 2020-04-02 | Discharge: 2020-04-02 | Disposition: A | Discharge status: Home / Self Care | Payer: Medicare Other | Source: Ambulatory Visit | Attending: Cardiology | Admitting: Cardiology

## 2020-04-02 ENCOUNTER — Encounter (HOSPITAL_COMMUNITY)
Admission: RE | Admit: 2020-04-02 | Discharge: 2020-04-02 | Disposition: A | Discharge status: Home / Self Care | Payer: Medicare Other | Source: Ambulatory Visit | Attending: Cardiology | Admitting: Cardiology

## 2020-04-02 ENCOUNTER — Encounter (HOSPITAL_BASED_OUTPATIENT_CLINIC_OR_DEPARTMENT_OTHER)
Admission: RE | Admit: 2020-04-02 | Discharge: 2020-04-02 | Disposition: A | Discharge status: Home / Self Care | Payer: Medicare Other | Source: Ambulatory Visit | Attending: Cardiology | Admitting: Cardiology

## 2020-04-02 ENCOUNTER — Encounter (HOSPITAL_COMMUNITY): Payer: Self-pay

## 2020-04-02 ENCOUNTER — Other Ambulatory Visit: Payer: Self-pay

## 2020-04-02 DIAGNOSIS — R0602 Shortness of breath: Secondary | ICD-10-CM | POA: Diagnosis not present

## 2020-04-02 DIAGNOSIS — R011 Cardiac murmur, unspecified: Secondary | ICD-10-CM | POA: Insufficient documentation

## 2020-04-02 DIAGNOSIS — R9431 Abnormal electrocardiogram [ECG] [EKG]: Secondary | ICD-10-CM | POA: Diagnosis not present

## 2020-04-02 LAB — NM MYOCAR MULTI W/SPECT W/WALL MOTION / EF
LV dias vol: 74 mL (ref 46–106)
LV sys vol: 18 mL
Peak HR: 84 {beats}/min
RATE: 0.34
Rest HR: 60 {beats}/min
SDS: 0
SRS: 0
SSS: 0
TID: 1.1

## 2020-04-02 LAB — ECHOCARDIOGRAM COMPLETE
AR max vel: 3.07 cm2
AV Area VTI: 2.79 cm2
AV Area mean vel: 2.65 cm2
AV Mean grad: 4.4 mmHg
AV Peak grad: 7.6 mmHg
Ao pk vel: 1.38 m/s
Area-P 1/2: 2.58 cm2
P 1/2 time: 482 msec
S' Lateral: 1.67 cm

## 2020-04-02 MED ORDER — TECHNETIUM TC 99M TETROFOSMIN IV KIT
30.0000 | PACK | Freq: Once | INTRAVENOUS | Status: AC | PRN
  Administered 2020-04-02: 32.5 via INTRAVENOUS

## 2020-04-02 MED ORDER — SODIUM CHLORIDE FLUSH 0.9 % IV SOLN
INTRAVENOUS | Status: AC
  Administered 2020-04-02: 10 mL via INTRAVENOUS
  Filled 2020-04-02: qty 10

## 2020-04-02 MED ORDER — REGADENOSON 0.4 MG/5ML IV SOLN
INTRAVENOUS | Status: AC
  Administered 2020-04-02: 0.4 mg via INTRAVENOUS
  Filled 2020-04-02: qty 5

## 2020-04-02 MED ORDER — TECHNETIUM TC 99M TETROFOSMIN IV KIT
10.0000 | PACK | Freq: Once | INTRAVENOUS | Status: AC | PRN
  Administered 2020-04-02: 10.3 via INTRAVENOUS

## 2020-04-02 NOTE — Progress Notes (Signed)
*  PRELIMINARY RESULTS* Echocardiogram 2D Echocardiogram has been performed.  [name] 04/02/2020, 11:21 AM

## 2020-04-24 ENCOUNTER — Telehealth: Payer: Self-pay | Admitting: Cardiology

## 2020-04-24 NOTE — Telephone Encounter (Signed)
Returned pt call. No answer. Left message to call back.

## 2020-04-24 NOTE — Telephone Encounter (Signed)
New message     Patient wants to know if she can discontinue some of the heart medication she is on since her test a couple of weeks ago came back normal ?

## 2020-04-24 NOTE — Telephone Encounter (Signed)
Returned patient call and patient stated that since she had a good echocardiogram, she wanted to know if it were possible to come off of some of her cardiac medication. When questioned about which ones she wanted to discontinue, she stated that Dr. Reynaldo Minium had given her a prescription for Metoprolol and Norvasc, which are not in her chart currently. I informed the patient that since Dr. Domenic Polite did not prescribe these medications to her, it may be best that she reach out to her PCP and have him evaluate the situation. Patient stated that she would continue to take the medications until her next follow up appointment with [name].

## 2020-04-24 NOTE — Telephone Encounter (Signed)
Returning Kisha's call  

## 2020-05-21 ENCOUNTER — Ambulatory Visit: Payer: Medicare Other | Admitting: Internal Medicine
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
## 2020-07-07 ENCOUNTER — Ambulatory Visit (INDEPENDENT_AMBULATORY_CARE_PROVIDER_SITE_OTHER): Payer: Medicare Other

## 2020-07-07 ENCOUNTER — Ambulatory Visit: Payer: Medicare Other | Admitting: Internal Medicine

## 2020-07-07 ENCOUNTER — Other Ambulatory Visit: Payer: Self-pay

## 2020-07-07 ENCOUNTER — Encounter: Payer: Self-pay | Admitting: Internal Medicine

## 2020-07-07 VITALS — BP 120/78 | HR 66 | Temp 97.4°F | Ht 63.0 in | Wt 205.0 lb

## 2020-07-07 DIAGNOSIS — R0600 Dyspnea, unspecified: Secondary | ICD-10-CM

## 2020-07-07 DIAGNOSIS — G4733 Obstructive sleep apnea (adult) (pediatric): Secondary | ICD-10-CM

## 2020-07-07 DIAGNOSIS — R0609 Other forms of dyspnea: Secondary | ICD-10-CM

## 2020-07-07 LAB — CBC WITH DIFFERENTIAL/PLATELET
Basophils Absolute: 0.1 10*3/uL (ref 0.0–0.1)
Basophils Relative: 1 % (ref 0.0–3.0)
Eosinophils Absolute: 0.2 10*3/uL (ref 0.0–0.7)
Eosinophils Relative: 2.4 % (ref 0.0–5.0)
HCT: 39.5 % (ref 36.0–46.0)
Hemoglobin: 13.1 g/dL (ref 12.0–15.0)
Lymphocytes Relative: 44.7 % (ref 12.0–46.0)
Lymphs Abs: 3.2 10*3/uL (ref 0.7–4.0)
MCHC: 33.1 g/dL (ref 30.0–36.0)
MCV: 82.2 fl (ref 78.0–100.0)
Monocytes Absolute: 0.6 10*3/uL (ref 0.1–1.0)
Monocytes Relative: 7.8 % (ref 3.0–12.0)
Neutro Abs: 3.1 10*3/uL (ref 1.4–7.7)
Neutrophils Relative %: 44.1 % (ref 43.0–77.0)
Platelets: 258 10*3/uL (ref 150.0–400.0)
RBC: 4.8 Mil/uL (ref 3.87–5.11)
RDW: 14.3 % (ref 11.5–15.5)
WBC: 7.1 10*3/uL (ref 4.0–10.5)

## 2020-07-07 LAB — BASIC METABOLIC PANEL
BUN: 14 mg/dL (ref 6–23)
CO2: 28 mEq/L (ref 19–32)
Calcium: 9.3 mg/dL (ref 8.4–10.5)
Chloride: 102 mEq/L (ref 96–112)
Creatinine, Ser: 0.83 mg/dL (ref 0.40–1.20)
GFR: 69.93 mL/min (ref >60.00)
Glucose, Bld: 161 mg/dL — ABNORMAL HIGH (ref 70–99)
Potassium: 3.9 mEq/L (ref 3.5–5.1)
Sodium: 136 mEq/L (ref 135–145)

## 2020-07-07 LAB — D-DIMER, QUANTITATIVE: D-Dimer, Quant: 0.47 mcg/mL FEU (ref <0.50)

## 2020-07-07 MED ORDER — TRELEGY ELLIPTA 100-62.5-25 MCG/INH IN AEPB: 1.0000 | INHALATION_SPRAY | Freq: Every day | RESPIRATORY_TRACT | 0 refills | Status: DC

## 2020-07-07 NOTE — Patient Instructions (Signed)
Order- sample Trelegy 100    Inhale 1 puff then rinse mouth, once daily. See if this helps your breathing.  Order- CXR   Dx Dyspnea on exertion  Order- lab- CBC w dif, BMET, D-dimer, BNP     Dx Dyspnea on Exertion  Please call if we can help  We can continue CPAP auto 8-15

## 2020-09-15 ENCOUNTER — Encounter: Payer: Self-pay | Admitting: Internal Medicine

## 2020-09-15 DIAGNOSIS — R0609 Other forms of dyspnea: Secondary | ICD-10-CM | POA: Insufficient documentation

## 2020-09-15 NOTE — Assessment & Plan Note (Signed)
Not sure what this represents. Glad cardiac issues explored. Plan- Sample Trelegy 100 for trial, CXR, CBC w diff, BNP, D-dimer

## 2020-09-15 NOTE — Assessment & Plan Note (Signed)
Benefits from CPAP with good compliance and control Plan- continue CPAP auto 8-15

## 2020-10-05 NOTE — Progress Notes (Signed)
Patient ID: [name], female    DOB: 01-05-1946, 75 y.o.   MRN: [id number]  HPI F followed for sleep apnea, allergic rhinitis, hx of lung nodule. NPSG 03/05/07- AHI 31.7/ hr, desaturation to 85%, CPAP to 14, body weight 195 lbs  --------------------------------------------------------------------------------  07/07/20- 75 year old female never smoker followed for OSA/CPAP, allergic rhinitis, prior lung nodule, complicated by DM 2, GERD, HBP, hypothyroid, CVA/ plavix CPAP auto 8-15/ [place] 100%, AHI 2.3/ hr Body weight today-205 lbs Coid vax- 3 Phizer Flu vax- had -----Patient states that she gets short of breath with exertion. Sleeping good and machine working good. ECHO 65-70% EF Doing fine with CPAP. More aware of DOE x 6-8 months, some tightness, no chest pain. Some wheeze, not coug. Little edema. Had some cardiac testing but she is unclear about results.   10/06/20- 75 year old female never smoker followed for OSA/CPAP, DOE, allergic rhinitis, prior lung nodule, complicated by DM 2, GERD, HBP, hypothyroid, CVA/ plavix CPAP auto 8-15/ [place] 100%, AHI 2.9/ hr Body weight today-203lbs Covid vax-3 Moderna Seen by me 4/18 for new problem DOE. ECHO- mild AR. Given sample Trelegy 100. Lab 4/18- Hgb 13.1, Glu 161, D-dimer 0.47 ------Patient states that she did not notice a difference with the Trelegy inhaler. Still has shortness of breath with exertion but states that its the same since last visit. Patient is sleeping good and machine is working good.  Some dry cough during night, Occ a little wheeze. Mainly notes panting to get breath after climbing stairs. Discussed CXR and labs. CXR 07/07/20-  IMPRESSION: No acute abnormalities.    Review of Systems-See HPI  + = positive Constitutional:   No-   weight loss, , fevers, chills, fatigue, lassitude. HEENT:   Mild  headaches, No-difficulty swallowing,  tooth/dental problems, sore throat,       No-  sneezing, itching, ear ache,   +nasal congestion, +post nasal drip- improved,  CV:  No-   chest pain, orthopnea, PND, swelling in lower extremities, anasarca, dizziness, palpitations Resp: +shortness of breath with exertion or at rest.              No-   productive cough,  No non-productive cough,  No-  coughing up of blood.              No-   change in color of mucus.  No- wheezing.   Skin: No-   rash or lesions. GI:  No-   heartburn, indigestion, abdominal pain, nausea, vomiting,  GU: . MS:  +   joint pain or swelling.  Neuro- nothing unusual:  Psych:  No- change in mood or affect. No depression or anxiety.  No memory loss.  Objective:   Physical Exam General- Alert, Oriented, Affect-appropriate, Distress- none acute, +overweight Skin- rash-none, lesions- none, excoriation- none Lymphadenopathy- none Head- atraumatic            Eyes- Gross vision intact, PERRLA, conjunctivae clear secretions            Ears- Hearing, canals-normal            Nose- turbinate edema, no-Septal dev, mucus, polyps, erosion, perforation             Throat- Mallampati III , mucosa Dry, drainage- none, tonsils- atrophic Neck- flexible , trachea midline, no stridor , thyroid nl, carotid no bruit Chest - symmetrical excursion , unlabored           Heart/CV- RRR ,  +Murmur 1/6 systolic  AS , no gallop  , no rub, nl s1 s2                           - JVD- none , edema- none, stasis changes- none, varices- none           Lung- clear to P&A, wheeze- none, cough- none , dullness-none, rub- none           Chest wall-  Abd-  Br/ Gen/ Rectal- Not done, not indicated Extrem- cyanosis- none, clubbing, none, atrophy- none, strength- nl. Neuro- grossly intact to observation

## 2020-10-06 ENCOUNTER — Ambulatory Visit: Payer: Medicare Other | Admitting: Internal Medicine

## 2020-10-06 ENCOUNTER — Encounter: Payer: Self-pay | Admitting: Internal Medicine

## 2020-10-06 ENCOUNTER — Other Ambulatory Visit: Payer: Self-pay

## 2020-10-06 DIAGNOSIS — G4733 Obstructive sleep apnea (adult) (pediatric): Secondary | ICD-10-CM

## 2020-10-06 DIAGNOSIS — R0600 Dyspnea, unspecified: Secondary | ICD-10-CM | POA: Diagnosis not present

## 2020-10-06 DIAGNOSIS — R0609 Other forms of dyspnea: Secondary | ICD-10-CM

## 2020-10-06 MED ORDER — BREZTRI AEROSPHERE 160-9-4.8 MCG/ACT IN AERO: 2.0000 | INHALATION_SPRAY | Freq: Two times a day (BID) | RESPIRATORY_TRACT | 0 refills | Status: DC

## 2020-10-06 NOTE — Assessment & Plan Note (Signed)
Benefits from CPAP with good compliance and control Plan- continue auto 8-15

## 2020-10-06 NOTE — Addendum Note (Signed)
Addended by: Elby Beck R on: 10/06/2020 02:21 PM   Modules accepted: Orders

## 2020-10-06 NOTE — Patient Instructions (Signed)
Order- schedule PFT  dx dyspnea on exertion  Order- sample x 2 Breztri inhaler    inhale 2 puffs then rinse mouth, twice daily  Continue CPAP  Please call if we can help

## 2020-10-06 NOTE — Assessment & Plan Note (Signed)
Unclear explanation. May be more cardiac limitation than appreciated. Plan- sample trial Breztri, schedule PFT

## 2021-01-06 ENCOUNTER — Ambulatory Visit: Payer: Medicare Other | Admitting: Internal Medicine

## 2021-01-09 ENCOUNTER — Other Ambulatory Visit: Payer: Self-pay | Admitting: Internal Medicine

## 2021-01-09 DIAGNOSIS — Z1231 Encounter for screening mammogram for malignant neoplasm of breast: Secondary | ICD-10-CM

## 2021-01-29 ENCOUNTER — Ambulatory Visit: Payer: Medicare Other | Admitting: Internal Medicine

## 2021-02-16 NOTE — Progress Notes (Signed)
Patient ID: [name], female    DOB: 1946/02/13, 75 y.o.   MRN: [id number]  HPI F followed for sleep apnea, allergic rhinitis, hx of lung nodule. NPSG 03/05/07- AHI 31.7/ hr, desaturation to 85%, CPAP to 14, body weight 195 lbs  --------------------------------------------------------------------------------   10/06/20- 75 year old female never smoker followed for OSA/CPAP, DOE, allergic rhinitis, prior lung nodule, complicated by DM 2, GERD, HBP, hypothyroid, CVA/ plavix CPAP auto 8-15/ [place] 100%, AHI 2.9/ hr Body weight today-203lbs Covid vax-3 Moderna Seen by me 4/18 for new problem DOE. ECHO- mild AR. Given sample Trelegy 100. Lab 4/18- Hgb 13.1, Glu 161, D-dimer 0.47 ------Patient states that she did not notice a difference with the Trelegy inhaler. Still has shortness of breath with exertion but states that its the same since last visit. Patient is sleeping good and machine is working good.  Some dry cough during night, Occ a little wheeze. Mainly notes panting to get breath after climbing stairs. Discussed CXR and labs. CXR 07/07/20-  IMPRESSION: No acute abnormalities.  02/17/21-75 year old female never smoker followed for OSA/CPAP, DOE, allergic rhinitis, prior lung nodule, complicated by DM 2, GERD, HBP, hypothyroid, CVA/ plavix CPAP auto 8-15/ [place] 80%, AHI 3.4/hr -sample Breztri> no benefit Body weight today- Covid vax-4 Moderna Flu vax-had -----Patient did PFT today and feels like she is doing good overall. No concerns PFT 02/17/21-looks normal on initial reading, pending formal review She feels well.  Respiratory did not add any benefit that was stopped.  We discussed dyspnea on exertion as best addressed with increased walking and activity for endurance and weight loss. She continues very comfortable with CPAP no changes required.  Download reviewed.   Review of Systems-See HPI  + =  positive Constitutional:   No-   weight loss, , fevers, chills, fatigue, lassitude. HEENT:   Mild  headaches, No-difficulty swallowing, tooth/dental problems, sore throat,       No-  sneezing, itching, ear ache,   +nasal congestion, +post nasal drip- improved,  CV:  No-   chest pain, orthopnea, PND, swelling in lower extremities, anasarca, dizziness, palpitations Resp: +shortness of breath with exertion or at rest.              No-   productive cough,  No non-productive cough,  No-  coughing up of blood.              No-   change in color of mucus.  No- wheezing.   Skin: No-   rash or lesions. GI:  No-   heartburn, indigestion, abdominal pain, nausea, vomiting,  GU: . MS:  +   joint pain or swelling.  Neuro- nothing unusual:  Psych:  No- change in mood or affect. No depression or anxiety.  No memory loss.  Objective:   Physical Exam General- Alert, Oriented, Affect-appropriate, Distress- none acute, +overweight Skin- rash-none, lesions- none, excoriation- none Lymphadenopathy- none Head- atraumatic            Eyes- Gross vision intact, PERRLA, conjunctivae clear secretions            Ears- Hearing, canals-normal            Nose- turbinate edema, no-Septal dev, mucus, polyps, erosion, perforation             Throat- Mallampati III , mucosa Dry, drainage- none, tonsils- atrophic Neck- flexible , trachea midline, no stridor , thyroid nl, carotid no bruit Chest - symmetrical excursion , unlabored  Heart/CV- RRR ,  +Murmur 1/6 systolic AS , no gallop  , no rub, nl s1 s2                           - JVD- none , edema- none, stasis changes- none, varices- none           Lung- clear to P&A, wheeze- none, cough- none , dullness-none, rub- none           Chest wall-  Abd-  Br/ Gen/ Rectal- Not done, not indicated Extrem- cyanosis- none, clubbing, none, atrophy- none, strength- nl. Neuro- grossly intact to observation

## 2021-02-17 ENCOUNTER — Ambulatory Visit (INDEPENDENT_AMBULATORY_CARE_PROVIDER_SITE_OTHER): Payer: Medicare Other | Admitting: Internal Medicine

## 2021-02-17 ENCOUNTER — Encounter: Payer: Self-pay | Admitting: Internal Medicine

## 2021-02-17 ENCOUNTER — Other Ambulatory Visit: Payer: Self-pay

## 2021-02-17 ENCOUNTER — Ambulatory Visit: Payer: Medicare Other | Admitting: Internal Medicine

## 2021-02-17 DIAGNOSIS — G4733 Obstructive sleep apnea (adult) (pediatric): Secondary | ICD-10-CM

## 2021-02-17 DIAGNOSIS — R0609 Other forms of dyspnea: Secondary | ICD-10-CM

## 2021-02-17 LAB — PULMONARY FUNCTION TEST
DL/VA % pred: 110 %
DL/VA: 4.56 ml/min/mmHg/L
DLCO cor % pred: 110 %
DLCO cor: 20.77 ml/min/mmHg
DLCO unc % pred: 110 %
DLCO unc: 20.77 ml/min/mmHg
FEF 25-75 Post: 2.5 L/sec
FEF 25-75 Pre: 1.78 L/sec
FEF2575-%Change-Post: 40 %
FEF2575-%Pred-Post: 148 %
FEF2575-%Pred-Pre: 106 %
FEV1-%Change-Post: 9 %
FEV1-%Pred-Post: 110 %
FEV1-%Pred-Pre: 100 %
FEV1-Post: 2.31 L
FEV1-Pre: 2.12 L
FEV1FVC-%Change-Post: 7 %
FEV1FVC-%Pred-Pre: 101 %
FEV6-%Change-Post: 1 %
FEV6-%Pred-Post: 106 %
FEV6-%Pred-Pre: 104 %
FEV6-Post: 2.83 L
FEV6-Pre: 2.78 L
FEV6FVC-%Pred-Post: 105 %
FEV6FVC-%Pred-Pre: 105 %
FVC-%Change-Post: 1 %
FVC-%Pred-Post: 101 %
FVC-%Pred-Pre: 99 %
FVC-Post: 2.83 L
FVC-Pre: 2.78 L
Post FEV1/FVC ratio: 82 %
Post FEV6/FVC ratio: 100 %
Pre FEV1/FVC ratio: 76 %
Pre FEV6/FVC Ratio: 100 %
RV % pred: 94 %
RV: 2.13 L
TLC % pred: 106 %
TLC: 5.28 L

## 2021-02-17 NOTE — Assessment & Plan Note (Signed)
Benefits from CPAP with good compliance and control Plan-continue auto 8-15

## 2021-02-17 NOTE — Patient Instructions (Signed)
Full PFT performed today. °

## 2021-02-17 NOTE — Assessment & Plan Note (Signed)
Normal PFT and clear chest exam.  Heart sounds are regular. Plan-encourage walking and activity for weight loss and endurance.

## 2021-02-17 NOTE — Progress Notes (Signed)
Full PFT performed today. °

## 2021-02-17 NOTE — Patient Instructions (Signed)
I'm glad you are doing well and hope you have a Merry Christmas !  We can continue CPAP auto 8-15  Walk and stay active to help with shortness of breath.  Please call if we can help

## 2021-02-18 ENCOUNTER — Encounter: Payer: Self-pay | Admitting: Cardiology

## 2021-02-18 NOTE — Telephone Encounter (Signed)
error 

## 2021-02-27 ENCOUNTER — Telehealth: Payer: Self-pay | Admitting: Internal Medicine

## 2021-02-27 DIAGNOSIS — G4733 Obstructive sleep apnea (adult) (pediatric): Secondary | ICD-10-CM

## 2021-02-27 NOTE — Telephone Encounter (Signed)
Called and spoke with patient. She stated that she needed to have an order for cpap supplies sent to [facility]. I advised her that I would go ahead and place the order for her. She verbalized understanding.   Nothing further needed at time of call.

## 2021-03-17 ENCOUNTER — Other Ambulatory Visit: Payer: Self-pay

## 2021-03-17 ENCOUNTER — Ambulatory Visit
Admission: RE | Admit: 2021-03-17 | Discharge: 2021-03-17 | Disposition: A | Discharge status: Home / Self Care | Payer: Medicare Other | Source: Ambulatory Visit | Attending: Internal Medicine | Admitting: Internal Medicine

## 2021-03-17 DIAGNOSIS — Z1231 Encounter for screening mammogram for malignant neoplasm of breast: Secondary | ICD-10-CM

## 2021-04-01 ENCOUNTER — Other Ambulatory Visit (HOSPITAL_COMMUNITY): Payer: Self-pay | Admitting: Internal Medicine

## 2021-04-01 DIAGNOSIS — M7989 Other specified soft tissue disorders: Secondary | ICD-10-CM

## 2021-04-01 DIAGNOSIS — M79605 Pain in left leg: Secondary | ICD-10-CM

## 2021-04-02 ENCOUNTER — Other Ambulatory Visit: Payer: Self-pay

## 2021-04-02 ENCOUNTER — Ambulatory Visit (HOSPITAL_COMMUNITY)
Admission: RE | Admit: 2021-04-02 | Discharge: 2021-04-02 | Disposition: A | Discharge status: Home / Self Care | Payer: Medicare Other | Source: Ambulatory Visit | Attending: Internal Medicine | Admitting: Internal Medicine

## 2021-04-02 DIAGNOSIS — M79605 Pain in left leg: Secondary | ICD-10-CM

## 2021-04-02 DIAGNOSIS — M7989 Other specified soft tissue disorders: Secondary | ICD-10-CM | POA: Diagnosis not present

## 2021-04-02 NOTE — Progress Notes (Signed)
VASCULAR LAB    Left lower extremity venous duplex has been performed.  See CV proc for preliminary results.   Derryck Shahan, RVT 04/02/2021, 8:42 AM

## 2021-10-14 NOTE — Progress Notes (Signed)
Cardiology Office Note    Date:  10/19/2021   ID:  [place], DOB [birth year], MRN [id number]   PCP:  Burnard Bunting, MD   [place]  Cardiologist:  Rozann Lesches, MD   Advanced Practice Provider:  No care team member to display Electrophysiologist:  None   [phone]   Chief Complaint  Patient presents with   Shortness of Breath    History of Present Illness:  [name] is a 76 y.o. female with history of HTN, DM2, HLD, CVA, HLD, OSA who saw Dr. Domenic Polite 03/25/20 for progressive DOE and atypical chest pain. Echo showed normal LVEF 65-70% mild to mod AI.low risk NST, no ischemia.  Patient comes in for f/u. Has a lot of DOE going upstairs to her apartment or housework. She has to sit down to get her breath.    Worse in humidity. PFT's showed small airway disease but inhalers aren't helping her. Under a lot of stress-her husband had CABG, stomach surgery, CVA and dementia. Daughter broke her leg so she's taking care of them. No regular exercise-hips hurt when she walks. Occasionally notices some chest pain when she lays down at night but fleeting and not enough to get up. No exertional chest pain.    Past Medical History:  Diagnosis Date   Anxiety    Depression    Essential hypertension    Eustachian tube dysfunction    Fibromyalgia    GERD (gastroesophageal reflux disease)    Hyperlipidemia    Hypothyroid    OSA (obstructive sleep apnea)    Osteopenia    Stroke ([place])    Type 2 diabetes mellitus ([place])     Past Surgical History:  Procedure Laterality Date   ANKLE SURGERY     Anteriorvesicourethropexy     BUNIONECTOMY     COLONOSCOPY     NASAL SINUS SURGERY     PATELLA FRACTURE SURGERY  2009   Right   TONSILLECTOMY     TOTAL ABDOMINAL HYSTERECTOMY     WRIST FRACTURE SURGERY  2009    Current Medications: Current Meds  Medication Sig   acetaminophen (TYLENOL) 500 MG tablet Take 1 tablet (500 mg total) by mouth every 6  (six) hours as needed.   amLODipine (NORVASC) 5 MG tablet Take 5 mg by mouth daily.   aspirin 81 MG chewable tablet Chew 81 mg by mouth daily.   DULoxetine (CYMBALTA) 30 MG capsule Take 1 capsule by mouth daily.   insulin lispro (HUMALOG) 100 UNIT/ML injection Inject 8-10 Units into the skin 3 (three) times daily before meals.   irbesartan (AVAPRO) 300 MG tablet Take 300 mg by mouth daily.   Methylcobalamin (B12) 5000 MCG SUBL Place 1 tablet under the tongue daily.   metoprolol succinate (TOPROL-XL) 50 MG 24 hr tablet Take 50 mg by mouth daily.   omeprazole (PRILOSEC) 20 MG capsule Take 20 mg by mouth daily.   rosuvastatin (CRESTOR) 20 MG tablet Take 20 mg by mouth at bedtime.   tirzepatide (MOUNJARO) 10 MG/0.5ML Pen Inject 10 mg into the skin once a week.   TOUJEO SOLOSTAR 300 UNIT/ML Solostar Pen Inject 30 Units into the skin daily.   triamterene-hydrochlorothiazide (MAXZIDE-25) 37.5-25 MG tablet daily.     Allergies:   Other   Social History   Socioeconomic History   Marital status: Married    Spouse name: Not on file   Number of children: 2   Years of education: Not on file  Highest education level: Not on file  Occupational History   Occupation: Disability  Tobacco Use   Smoking status: Never   Smokeless tobacco: Never  Vaping Use   Vaping Use: Never used  Substance and Sexual Activity   Alcohol use: No   Drug use: No   Sexual activity: Not on file  Other Topics Concern   Not on file  Social History Narrative   No etoh   No street drug use   No risk for hiv   Social Determinants of Health   Financial Resource Strain: Not on file  Food Insecurity: Not on file  Transportation Needs: Not on file  Physical Activity: Not on file  Stress: Not on file  Social Connections: Not on file     Family History:  The patient's  family history includes Colon cancer in her father; Heart attack in her mother.   ROS:   Please see the history of present illness.    ROS All  other systems reviewed and are negative.   PHYSICAL EXAM:   VS:  BP 122/78   Pulse 71   Ht 5' 3.5" (1.613 m)   Wt 193 lb 12.8 oz (87.9 kg)   SpO2 96%   BMI 33.79 kg/m   Physical Exam  GEN: Obese, in no acute distress  Neck: no JVD, carotid bruits, or masses Cardiac:RRR; 1/6 diastolic murmur LSB  Respiratory:  clear to auscultation bilaterally, normal work of breathing GI: soft, nontender, nondistended, + BS Ext: without cyanosis, clubbing, or edema, Good distal pulses bilaterally Neuro:  Alert and Oriented x 3,  Psych: euthymic mood, full affect  Wt Readings from Last 3 Encounters:  10/19/21 193 lb 12.8 oz (87.9 kg)  02/17/21 201 lb (91.2 kg)  10/06/20 203 lb 9.6 oz (92.4 kg)      Studies/Labs Reviewed:   EKG:  EKG is  ordered today.  The ekg ordered today demonstrates NSR, normal EKG  Recent Labs: No results found for requested labs within last 365 days.   Lipid Panel    Component Value Date/Time   CHOL (H) 08/12/2008 0600    224        ATP III CLASSIFICATION:  <200     mg/dL   Desirable  200-239  mg/dL   Borderline High  >=240    mg/dL   High          TRIG 154 (H) 08/12/2008 0600   HDL 40 08/12/2008 0600   CHOLHDL 5.6 08/12/2008 0600   VLDL 31 08/12/2008 0600   LDLCALC (H) 08/12/2008 0600    153        Total Cholesterol/HDL:CHD Risk Coronary Heart Disease Risk Table                     Men   Women  1/2 Average Risk   3.4   3.3  Average Risk       5.0   4.4  2 X Average Risk   9.6   7.1  3 X Average Risk  23.4   11.0        Use the calculated Patient Ratio above and the CHD Risk Table to determine the patient's CHD Risk.        ATP III CLASSIFICATION (LDL):  <100     mg/dL   Optimal  100-129  mg/dL   Near or Above  Optimal  130-159  mg/dL   Borderline  160-189  mg/dL   High  >190     mg/dL   Very High    Additional studies/ records that were reviewed today include:   NST 03/2020 There was no ST segment deviation noted during  stress. The study is normal. There are no perfusion defects consistent with prior infarct or current ischemia. This is a low risk study. The left ventricular ejection fraction is normal (55-65%).  2Decho 04/02/20 IMPRESSIONS     1. Left ventricular ejection fraction, by estimation, is 65 to 70%. The  left ventricle has normal function. The left ventricle has no regional  wall motion abnormalities. There is mild left ventricular hypertrophy.  Left ventricular diastolic parameters  are consistent with Grade I diastolic dysfunction (impaired relaxation).   2. Right ventricular systolic function is normal. The right ventricular  size is normal.   3. The mitral valve is normal in structure. Mild mitral valve  regurgitation. No evidence of mitral stenosis.   4. The aortic valve is tricuspid. Aortic valve regurgitation is mild to  moderate. No aortic stenosis is present.   5. The inferior vena cava is normal in size with greater than 50%  respiratory variability, suggesting right atrial pressure of 3 mmHg.   Risk Assessment/Calculations:         ASSESSMENT:    1. Dyspnea on exertion   2. Chest pain, unspecified type   3. Aortic valve insufficiency, etiology of cardiac valve disease unspecified   4. Essential hypertension   5. Hyperlipidemia, unspecified hyperlipidemia type   6. History of CVA (cerebrovascular accident)      PLAN:  In order of problems listed above:  DOE-ongoing. Has small airway disease on PFT's last year but inhalers don't help. Compliant with Cpap. No regular exercise so could be exercise intolerance contributing as well. Will update echo, encourage regular exercise and f/u with Dr. Annamaria Boots.  History of chest pain with low risk myoview 04/02/20-no recent chest pain  Mild to mod AI-update echo  HTN BP well controlled on amlodipine, irbesartan, metoprolol  HLD on crestor LDL 70 03/2021  DM2 A1C 7.1  History of CVA on ASA  Shared Decision Making/Informed  Consent        Medication Adjustments/Labs and Tests Ordered: Current medicines are reviewed at length with the patient today.  Concerns regarding medicines are outlined above.  Medication changes, Labs and Tests ordered today are listed in the Patient Instructions below. Patient Instructions  Medication Instructions: Your physician recommends that you continue on your current medications as directed. Please refer to the Current Medication list given to you today.   Labwork: None today  Testing/Procedures: Your physician has requested that you have an echocardiogram. Echocardiography is a painless test that uses sound waves to create images of your heart. It provides your doctor with information about the size and shape of your heart and how well your heart's chambers and valves are working. This procedure takes approximately one hour. There are no restrictions for this procedure.   Follow-Up: 1 year with Dr.McDowell  Any Other Special Instructions Will Be Listed Below (If Applicable).    Follow up with Dr.Young for shortness of breath    Exercise 150 minutes per week  If you need a refill on your cardiac medications before your next appointment, please call your pharmacy.    Sumner Boast, PA-C  10/19/2021 11:58 AM    [facility] [place],  [place], [place]  [address] Phone: [phone]; Fax: [phone]

## 2021-10-19 ENCOUNTER — Encounter: Payer: Self-pay | Admitting: Physician Assistant

## 2021-10-19 ENCOUNTER — Ambulatory Visit: Payer: Medicare Other | Admitting: Physician Assistant

## 2021-10-19 VITALS — BP 122/78 | HR 71 | Ht 63.5 in | Wt 193.8 lb

## 2021-10-19 DIAGNOSIS — E785 Hyperlipidemia, unspecified: Secondary | ICD-10-CM

## 2021-10-19 DIAGNOSIS — I351 Nonrheumatic aortic (valve) insufficiency: Secondary | ICD-10-CM | POA: Diagnosis not present

## 2021-10-19 DIAGNOSIS — R0609 Other forms of dyspnea: Secondary | ICD-10-CM

## 2021-10-19 DIAGNOSIS — I10 Essential (primary) hypertension: Secondary | ICD-10-CM

## 2021-10-19 DIAGNOSIS — Z8673 Personal history of transient ischemic attack (TIA), and cerebral infarction without residual deficits: Secondary | ICD-10-CM | POA: Diagnosis not present

## 2021-10-19 DIAGNOSIS — R079 Chest pain, unspecified: Secondary | ICD-10-CM

## 2021-10-19 NOTE — Patient Instructions (Addendum)
Medication Instructions: Your physician recommends that you continue on your current medications as directed. Please refer to the Current Medication list given to you today.   Labwork: None today  Testing/Procedures: Your physician has requested that you have an echocardiogram. Echocardiography is a painless test that uses sound waves to create images of your heart. It provides your doctor with information about the size and shape of your heart and how well your heart's chambers and valves are working. This procedure takes approximately one hour. There are no restrictions for this procedure.   Follow-Up: 1 year with Dr.McDowell  Any Other Special Instructions Will Be Listed Below (If Applicable).    Follow up with Dr.Young for shortness of breath    Exercise 150 minutes per week  If you need a refill on your cardiac medications before your next appointment, please call your pharmacy.

## 2021-10-28 ENCOUNTER — Ambulatory Visit (HOSPITAL_COMMUNITY)
Admission: RE | Admit: 2021-10-28 | Discharge: 2021-10-28 | Disposition: A | Discharge status: Home / Self Care | Payer: Medicare Other | Source: Ambulatory Visit | Attending: Physician Assistant | Admitting: Physician Assistant

## 2021-10-28 DIAGNOSIS — I10 Essential (primary) hypertension: Secondary | ICD-10-CM

## 2021-10-28 DIAGNOSIS — I351 Nonrheumatic aortic (valve) insufficiency: Secondary | ICD-10-CM | POA: Diagnosis not present

## 2021-10-28 LAB — ECHOCARDIOGRAM COMPLETE
AR max vel: 2.91 cm2
AV Area VTI: 3.01 cm2
AV Area mean vel: 2.53 cm2
AV Mean grad: 5 mmHg
AV Peak grad: 8.8 mmHg
Ao pk vel: 1.48 m/s
Area-P 1/2: 2.9 cm2
Calc EF: 62.8 %
MV VTI: 2.72 cm2
S' Lateral: 2.6 cm
Single Plane A2C EF: 62 %
Single Plane A4C EF: 65.7 %

## 2021-10-28 NOTE — Progress Notes (Signed)
*  PRELIMINARY RESULTS* Echocardiogram 2D Echocardiogram has been performed.  Elpidio Anis 10/28/2021, 11:31 AM

## 2021-12-03 ENCOUNTER — Telehealth: Payer: Self-pay | Admitting: Internal Medicine

## 2021-12-03 DIAGNOSIS — G4733 Obstructive sleep apnea (adult) (pediatric): Secondary | ICD-10-CM

## 2021-12-03 NOTE — Telephone Encounter (Signed)
Patient states that her CPAP machine has been making a weird noise for about a week- she has changed the mask and hose and it is still doing it. Patient states she has had it a while but would like to know when she got it and if she is qualified for a new one.  Please call back at [phone].

## 2021-12-07 NOTE — Telephone Encounter (Signed)
Order- DME [place]- please service or replace old CPAP machine ("making noises") Autopap 8-15, mask of choice, humidifier, supplies, AirVieww/ card

## 2021-12-07 NOTE — Telephone Encounter (Signed)
Patient called and asked if we can send in new order for cpap. He states its making weird noises and he has changed the mask and everything.   And the old settings I have is 8-15 auto!  Is this correct sir?  And are you ok with the new cpap order  Please advise

## 2021-12-07 NOTE — Telephone Encounter (Signed)
Repair order placed and new order for cpap placed if unable to fix it. Nothing further needed

## 2022-01-15 ENCOUNTER — Telehealth: Payer: Self-pay | Admitting: Cardiology

## 2022-01-15 NOTE — Telephone Encounter (Signed)
Patient states she has had occasional CP since she was first seen 03/25/20. She says pain would be occasional, but the last 3 days she has had 3 episodes daily for the past 3 days.  Describers as brief on left side of her chest,sharp,twinges, lasting a minute or so. No N/V,SOB, diaphoresis or radiation. No relieved by any measures and she cannot recreate with movement or activity.   She had both lexiscan and echo done,low risk.    I will forward to MD for review.

## 2022-01-15 NOTE — Telephone Encounter (Signed)
[name] accepted the appointment at [facility] in [place] on 11/2 at 2:20 pm with D.Dunn, PA-C She will call back if there are any conflicts with apt. She also agrees to go to the ED should sx's escalate.

## 2022-01-15 NOTE — Telephone Encounter (Signed)
  Pt c/o of Chest Pain: STAT if CP now or developed within 24 hours  1. Are you having CP right now? No   2. Are you experiencing any other symptoms (ex. SOB, nausea, vomiting, sweating)? None   3. How long have you been experiencing CP? 3 days ago  4. Is your CP continuous or coming and going? Coming and going  5. Have you taken Nitroglycerin? No   Pt said, 3 days ago she keep experiencing CP, sometimes during exertion sometimes when she just sitting. She denied any there symptoms  ?

## 2022-01-20 ENCOUNTER — Encounter: Payer: Self-pay | Admitting: Physician Assistant

## 2022-01-20 NOTE — Progress Notes (Deleted)
Cardiology Office Note    Date:  01/20/2022   ID:  [place], DOB 08/05/1945, MRN [id number]  PCP:  Burnard Bunting, MD  Cardiologist:  Rozann Lesches, MD  Electrophysiologist:  None   Chief Complaint: ***  History of Present Illness:   [name] is a 76 y.o. 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 She was previously evaluated for DOE and atypical chest pain. Stress test 03/2020 was low risk. Echo 03/2020 showed normal EF, G1DD, mild-moderate AI though later felt trivial to mild. This was repeated in 10/2020 due to DOE, with EF 60-65%, G1DD, normal RV, trivial AI.  Any other recent labs? TSH? lipid R duplex Cor CT prob Sees dr young  Precordial pain Essential HTN Hyperlipidemia Right carotid artery stenosis  Labwork independently reviewed: 06/2020 CBC wnl, K 3.9, Cr 0..83, glcuose 161, d-dimer wnl   Cardiology Studies:   Studies reviewed are outlined and summarized above. Reports included below if pertinent.   Echo 10/2021    1. Left ventricular ejection fraction, by estimation, is 60 to 65%. The  left ventricle has normal function. The left ventricle has no regional  wall motion abnormalities. Left ventricular diastolic parameters are  consistent with Grade I diastolic  dysfunction (impaired relaxation).   2. Right ventricular systolic function is normal. The right ventricular  size is normal. There is normal pulmonary artery systolic pressure. The  estimated right ventricular systolic pressure is 15.1 mmHg.   3. The mitral valve is grossly normal. Trivial mitral valve  regurgitation. No evidence of mitral stenosis.   4. The aortic valve is tricuspid. There is mild thickening of the aortic  valve. Aortic valve regurgitation is trivial. No  aortic stenosis is  present.   5. The inferior vena cava is normal in size with greater than 50%  respiratory variability, suggesting right atrial pressure of 3 mmHg.   Comparison(s): Changes from prior study are noted. AI is trivial on this  study. Suspect trivial to mild on the last study.   Nuc 03/2020   There was no ST segment deviation noted during stress. The study is normal. There are no perfusion defects consistent with prior infarct or current ischemia. This is a low risk study. The left ventricular ejection fraction is normal (55-65%).    Carotid 10/2018    Findings reported to left voicemail to Dr. Virgina Jock at 2:40pm.  Summary:  Right Carotid: Velocities in the right ICA are consistent with a 1-39%  stenosis.   Left Carotid: The extracranial vessels were near-normal with only minimal  wall                thickening or plaque.   Vertebrals:  Bilateral vertebral arteries demonstrate antegrade flow.  Subclavians: Normal flow hemodynamics were seen in bilateral subclavian               arteries.   *See table(s) above for measurements and observations.       Electronically signed by Curt Jews MD on 10/31/2018 at 3:00:28 PM.        Past Medical History:  Diagnosis Date   Anxiety    Depression    Essential hypertension    Eustachian tube dysfunction    Fibromyalgia    GERD (gastroesophageal reflux disease)  Hyperlipidemia    Hypothyroid    OSA (obstructive sleep apnea)    Osteopenia    Stroke ([place])    Type 2 diabetes mellitus ([place])     Past Surgical History:  Procedure Laterality Date   ANKLE SURGERY     Anteriorvesicourethropexy     BUNIONECTOMY     COLONOSCOPY     NASAL SINUS SURGERY     PATELLA FRACTURE SURGERY  2009   Right   TONSILLECTOMY     TOTAL ABDOMINAL HYSTERECTOMY     WRIST FRACTURE SURGERY  2009    Current Medications: No outpatient medications have been marked as taking for the 01/21/22 encounter (Appointment) with Charlie Pitter,  PA-C.   ***   Allergies:   Other   Social History   Socioeconomic History   Marital status: Married    Spouse name: Not on file   Number of children: 2   Years of education: Not on file   Highest education level: Not on file  Occupational History   Occupation: Disability  Tobacco Use   Smoking status: Never   Smokeless tobacco: Never  Vaping Use   Vaping Use: Never used  Substance and Sexual Activity   Alcohol use: No   Drug use: No   Sexual activity: Not on file  Other Topics Concern   Not on file  Social History Narrative   No etoh   No street drug use   No risk for hiv   Social Determinants of Health   Financial Resource Strain: Not on file  Food Insecurity: Not on file  Transportation Needs: Not on file  Physical Activity: Not on file  Stress: Not on file  Social Connections: Not on file     Family History:  The patient's ***family history includes Colon cancer in her father; Heart attack in her mother.  ROS:   Please see the history of present illness. Otherwise, review of systems is positive for ***.  All other systems are reviewed and otherwise negative.    EKG(s)/Additional Labs   EKG:  EKG is ordered today, personally reviewed, demonstrating ***  Recent Labs: No results found for requested labs within last 365 days.  Recent Lipid Panel    Component Value Date/Time   CHOL (H) 08/12/2008 0600    224        ATP III CLASSIFICATION:  <200     mg/dL   Desirable  200-239  mg/dL   Borderline High  >=240    mg/dL   High          TRIG 154 (H) 08/12/2008 0600   HDL 40 08/12/2008 0600   CHOLHDL 5.6 08/12/2008 0600   VLDL 31 08/12/2008 0600   LDLCALC (H) 08/12/2008 0600    153        Total Cholesterol/HDL:CHD Risk Coronary Heart Disease Risk Table                     Men   Women  1/2 Average Risk   3.4   3.3  Average Risk       5.0   4.4  2 X Average Risk   9.6   7.1  3 X Average Risk  23.4   11.0        Use the calculated Patient  Ratio above and the CHD Risk Table to determine the patient's CHD Risk.        ATP III CLASSIFICATION (LDL):  <100  mg/dL   Optimal  100-129  mg/dL   Near or Above                    Optimal  130-159  mg/dL   Borderline  160-189  mg/dL   High  >190     mg/dL   Very High    PHYSICAL EXAM:    VS:  There were no vitals taken for this visit.  BMI: There is no height or weight on file to calculate BMI.  GEN: Well nourished, well developed female in no acute distress HEENT: normocephalic, atraumatic Neck: no JVD, carotid bruits, or masses Cardiac: ***RRR; no murmurs, rubs, or gallops, no edema  Respiratory:  clear to auscultation bilaterally, normal work of breathing GI: soft, nontender, nondistended, + BS MS: no deformity or atrophy Skin: warm and dry, no rash Neuro:  Alert and Oriented x 3, Strength and sensation are intact, follows commands Psych: euthymic mood, full affect  Wt Readings from Last 3 Encounters:  10/19/21 193 lb 12.8 oz (87.9 kg)  02/17/21 201 lb (91.2 kg)  10/06/20 203 lb 9.6 oz (92.4 kg)     ASSESSMENT & PLAN:   ***     Disposition: F/u with ***   Medication Adjustments/Labs and Tests Ordered: Current medicines are reviewed at length with the patient today.  Concerns regarding medicines are outlined above. Medication changes, Labs and Tests ordered today are summarized above and listed in the Patient Instructions accessible in Encounters.   Signed, Charlie Pitter, PA-C  01/20/2022 1:19 PM    [facility] Phone: [phone]; Fax: [phone]

## 2022-01-21 ENCOUNTER — Ambulatory Visit: Payer: Medicare Other | Admitting: Physician Assistant

## 2022-01-21 DIAGNOSIS — E785 Hyperlipidemia, unspecified: Secondary | ICD-10-CM

## 2022-01-21 DIAGNOSIS — I10 Essential (primary) hypertension: Secondary | ICD-10-CM

## 2022-01-21 DIAGNOSIS — I6521 Occlusion and stenosis of right carotid artery: Secondary | ICD-10-CM

## 2022-01-21 DIAGNOSIS — R072 Precordial pain: Secondary | ICD-10-CM

## 2022-02-17 ENCOUNTER — Other Ambulatory Visit: Payer: Self-pay | Admitting: Internal Medicine

## 2022-02-17 DIAGNOSIS — Z1231 Encounter for screening mammogram for malignant neoplasm of breast: Secondary | ICD-10-CM

## 2022-02-17 NOTE — Progress Notes (Unsigned)
Patient ID: [name], female    DOB: December 23, 1945, 75 y.o.   MRN: [id number]  HPI F followed for sleep apnea, allergic rhinitis, hx of lung nodule. NPSG 03/05/07- AHI 31.7/ hr, desaturation to 85%, CPAP to 14, body weight 195 lbs PFT 02/17/21- WNL --------------------------------------------------------------------------------   02/17/21-76 year old female never smoker followed for OSA/CPAP, DOE, allergic rhinitis, prior lung nodule, complicated by DM 2, GERD, HBP, hypothyroid, CVA/ plavix CPAP auto 8-15/ [place] 80%, AHI 3.4/hr -sample Breztri> no benefit Body weight today- Covid vax-4 Moderna Flu vax-had -----Patient did PFT today and feels like she is doing good overall. No concerns PFT 02/17/21-WNL She feels well.  Respiratory did not add any benefit that was stopped.  We discussed dyspnea on exertion as best addressed with increased walking and activity for endurance and weight loss. She continues very comfortable with CPAP no changes required.  Download reviewed.  02/18/22- 76 year old female never smoker followed for OSA/CPAP, DOE, allergic rhinitis, prior lung nodule, complicated by DM 2, GERD, HBP, hypothyroid, CVA/ plavix CPAP auto 8-15/ [facility]  -sample Breztri> no benefit Body weight today- Covid vax-4 Moderna Flu vax- We ordered repair or replace CPAP in Sept.     Review of Systems-See HPI  + = positive Constitutional:   No-   weight loss, , fevers, chills, fatigue, lassitude. HEENT:   Mild  headaches, No-difficulty swallowing, tooth/dental problems, sore throat,       No-  sneezing, itching, ear ache,   +nasal congestion, +post nasal drip- improved,  CV:  No-   chest pain, orthopnea, PND, swelling in lower extremities, anasarca, dizziness, palpitations Resp: +shortness of breath with exertion or at rest.              No-   productive cough,  No non-productive cough,  No-  coughing up of  blood.              No-   change in color of mucus.  No- wheezing.   Skin: No-   rash or lesions. GI:  No-   heartburn, indigestion, abdominal pain, nausea, vomiting,  GU: . MS:  +   joint pain or swelling.  Neuro- nothing unusual:  Psych:  No- change in mood or affect. No depression or anxiety.  No memory loss.  Objective:   Physical Exam General- Alert, Oriented, Affect-appropriate, Distress- none acute, +overweight Skin- rash-none, lesions- none, excoriation- none Lymphadenopathy- none Head- atraumatic            Eyes- Gross vision intact, PERRLA, conjunctivae clear secretions            Ears- Hearing, canals-normal            Nose- turbinate edema, no-Septal dev, mucus, polyps, erosion, perforation             Throat- Mallampati III , mucosa Dry, drainage- none, tonsils- atrophic Neck- flexible , trachea midline, no stridor , thyroid nl, carotid no bruit Chest - symmetrical excursion , unlabored           Heart/CV- RRR ,  +Murmur 1/6 systolic AS , no gallop  , no rub, nl s1 s2                           - JVD- none , edema- none, stasis changes- none, varices- none           Lung- clear to P&A, wheeze- none, cough- none , dullness-none,  rub- none           Chest wall-  Abd-  Br/ Gen/ Rectal- Not done, not indicated Extrem- cyanosis- none, clubbing, none, atrophy- none, strength- nl. Neuro- grossly intact to observation

## 2022-02-18 ENCOUNTER — Ambulatory Visit: Payer: Medicare Other | Admitting: Internal Medicine

## 2022-02-18 ENCOUNTER — Encounter: Payer: Self-pay | Admitting: Internal Medicine

## 2022-02-18 VITALS — BP 120/70 | HR 63 | Ht 63.5 in | Wt 184.8 lb

## 2022-02-18 DIAGNOSIS — G4733 Obstructive sleep apnea (adult) (pediatric): Secondary | ICD-10-CM | POA: Diagnosis not present

## 2022-02-18 DIAGNOSIS — J3089 Other allergic rhinitis: Secondary | ICD-10-CM

## 2022-02-18 DIAGNOSIS — J302 Other seasonal allergic rhinitis: Secondary | ICD-10-CM

## 2022-02-18 NOTE — Assessment & Plan Note (Signed)
She feels she has good enough control using nasal saline and occasional Sudafed.  This is not interfering with CPAP use.

## 2022-02-18 NOTE — Patient Instructions (Signed)
We can continue CPAP auto 8-15  Please call if we can help

## 2022-02-18 NOTE — Assessment & Plan Note (Signed)
Having some difficulty initiating and maintaining sleep/insomnia but does not want specific therapy for it at this time.  Sleep hygiene reviewed.  This leads to frequent short nights on download but otherwise machine is working well.  She will discuss concern about tube leaks with DME if necessary. Plan-continue CPAP auto 8-15

## 2022-03-29 DIAGNOSIS — E039 Hypothyroidism, unspecified: Secondary | ICD-10-CM | POA: Diagnosis not present

## 2022-03-29 DIAGNOSIS — Z1212 Encounter for screening for malignant neoplasm of rectum: Secondary | ICD-10-CM | POA: Diagnosis not present

## 2022-03-29 DIAGNOSIS — E1169 Type 2 diabetes mellitus with other specified complication: Secondary | ICD-10-CM | POA: Diagnosis not present

## 2022-03-29 DIAGNOSIS — E785 Hyperlipidemia, unspecified: Secondary | ICD-10-CM | POA: Diagnosis not present

## 2022-03-29 DIAGNOSIS — I10 Essential (primary) hypertension: Secondary | ICD-10-CM | POA: Diagnosis not present

## 2022-03-29 DIAGNOSIS — R7989 Other specified abnormal findings of blood chemistry: Secondary | ICD-10-CM | POA: Diagnosis not present

## 2022-04-05 DIAGNOSIS — Z23 Encounter for immunization: Secondary | ICD-10-CM | POA: Diagnosis not present

## 2022-04-05 DIAGNOSIS — Z0000 Encounter for general adult medical examination without abnormal findings: Secondary | ICD-10-CM | POA: Diagnosis not present

## 2022-04-05 DIAGNOSIS — E119 Type 2 diabetes mellitus without complications: Secondary | ICD-10-CM | POA: Diagnosis not present

## 2022-04-05 DIAGNOSIS — M1909 Primary osteoarthritis, other specified site: Secondary | ICD-10-CM | POA: Diagnosis not present

## 2022-04-05 DIAGNOSIS — Z1331 Encounter for screening for depression: Secondary | ICD-10-CM | POA: Diagnosis not present

## 2022-04-05 DIAGNOSIS — E669 Obesity, unspecified: Secondary | ICD-10-CM | POA: Diagnosis not present

## 2022-04-05 DIAGNOSIS — G450 Vertebro-basilar artery syndrome: Secondary | ICD-10-CM | POA: Diagnosis not present

## 2022-04-05 DIAGNOSIS — E785 Hyperlipidemia, unspecified: Secondary | ICD-10-CM | POA: Diagnosis not present

## 2022-04-05 DIAGNOSIS — E1169 Type 2 diabetes mellitus with other specified complication: Secondary | ICD-10-CM | POA: Diagnosis not present

## 2022-04-05 DIAGNOSIS — Z1339 Encounter for screening examination for other mental health and behavioral disorders: Secondary | ICD-10-CM | POA: Diagnosis not present

## 2022-04-05 DIAGNOSIS — Z794 Long term (current) use of insulin: Secondary | ICD-10-CM | POA: Diagnosis not present

## 2022-04-05 DIAGNOSIS — I10 Essential (primary) hypertension: Secondary | ICD-10-CM | POA: Diagnosis not present

## 2022-04-05 DIAGNOSIS — R82998 Other abnormal findings in urine: Secondary | ICD-10-CM | POA: Diagnosis not present

## 2022-04-13 ENCOUNTER — Telehealth: Payer: Self-pay | Admitting: Internal Medicine

## 2022-04-13 DIAGNOSIS — G4733 Obstructive sleep apnea (adult) (pediatric): Secondary | ICD-10-CM

## 2022-04-14 NOTE — Telephone Encounter (Signed)
Called and spoke to patient and she states that due to insurance she is having to change DME companies to [facility] and she was needing new supplies for her machine. New order placed and update patient care not. Nothing further needed

## 2022-04-15 ENCOUNTER — Ambulatory Visit
Admission: RE | Admit: 2022-04-15 | Discharge: 2022-04-15 | Disposition: A | Discharge status: Home / Self Care | Payer: Medicare HMO | Source: Ambulatory Visit | Attending: Internal Medicine | Admitting: Internal Medicine

## 2022-04-15 DIAGNOSIS — Z1231 Encounter for screening mammogram for malignant neoplasm of breast: Secondary | ICD-10-CM

## 2022-04-20 DIAGNOSIS — Z961 Presence of intraocular lens: Secondary | ICD-10-CM | POA: Diagnosis not present

## 2022-04-20 DIAGNOSIS — H524 Presbyopia: Secondary | ICD-10-CM | POA: Diagnosis not present

## 2022-04-20 DIAGNOSIS — E119 Type 2 diabetes mellitus without complications: Secondary | ICD-10-CM | POA: Diagnosis not present

## 2022-04-27 ENCOUNTER — Ambulatory Visit (HOSPITAL_COMMUNITY): Payer: Medicare HMO

## 2022-04-30 DIAGNOSIS — R238 Other skin changes: Secondary | ICD-10-CM | POA: Diagnosis not present

## 2022-04-30 DIAGNOSIS — H9201 Otalgia, right ear: Secondary | ICD-10-CM | POA: Diagnosis not present

## 2022-05-03 DIAGNOSIS — E119 Type 2 diabetes mellitus without complications: Secondary | ICD-10-CM | POA: Diagnosis not present

## 2022-06-08 ENCOUNTER — Telehealth: Payer: Self-pay

## 2022-06-08 DIAGNOSIS — E1169 Type 2 diabetes mellitus with other specified complication: Secondary | ICD-10-CM | POA: Diagnosis not present

## 2022-06-08 DIAGNOSIS — R599 Enlarged lymph nodes, unspecified: Secondary | ICD-10-CM | POA: Diagnosis not present

## 2022-06-08 DIAGNOSIS — Z20818 Contact with and (suspected) exposure to other bacterial communicable diseases: Secondary | ICD-10-CM | POA: Diagnosis not present

## 2022-06-08 DIAGNOSIS — R6883 Chills (without fever): Secondary | ICD-10-CM | POA: Diagnosis not present

## 2022-06-08 DIAGNOSIS — R5383 Other fatigue: Secondary | ICD-10-CM | POA: Diagnosis not present

## 2022-06-08 DIAGNOSIS — R0981 Nasal congestion: Secondary | ICD-10-CM | POA: Diagnosis not present

## 2022-06-08 DIAGNOSIS — Z8616 Personal history of COVID-19: Secondary | ICD-10-CM | POA: Diagnosis not present

## 2022-06-08 DIAGNOSIS — J0100 Acute maxillary sinusitis, unspecified: Secondary | ICD-10-CM | POA: Diagnosis not present

## 2022-06-08 DIAGNOSIS — Z1152 Encounter for screening for COVID-19: Secondary | ICD-10-CM | POA: Diagnosis not present

## 2022-06-08 NOTE — Patient Outreach (Signed)
Received a Pharmacy referral for Medication Assistance for Furnas ,Humalog, Monjaro from [place].     I have sent an the referral to the [place] Team through [place].    [facility], [place], [place] [facility] [phone]

## 2022-06-14 ENCOUNTER — Telehealth: Payer: Self-pay | Admitting: Pharmacist

## 2022-06-14 NOTE — Progress Notes (Signed)
[facility])                                            [place] Team    06/14/2022  [place] 11-22-1945 [id number]  Patient was called regarding medication assistance on 06/08/22 and again on 06/14/22.  HIPAA identifiers were obtained.  On both occasions, patient said she had already spoken with someone from her provider's office about medication assistance and was in the process of gathering financial documents for them.  During today's call, patient said she did not have time to speak with me but assured me her medications were being handled by someone else.  I reviewed her chart at [facility] but did not see a note in their system about someone from their practice working with her on med assistance. She has an upcoming appointment with Oretha Ellis, PharmD at that practice.  A note will be sent to Oretha Ellis prior to the upcoming appointment.  Plan:  Follow with Oretha Ellis, PharmD at [facility]. Appt is 07/07/22  Elayne Guerin, PharmD, [place] Clinical Pharmacist [phone]

## 2022-07-07 DIAGNOSIS — K760 Fatty (change of) liver, not elsewhere classified: Secondary | ICD-10-CM | POA: Diagnosis not present

## 2022-07-07 DIAGNOSIS — Z794 Long term (current) use of insulin: Secondary | ICD-10-CM | POA: Diagnosis not present

## 2022-07-07 DIAGNOSIS — I10 Essential (primary) hypertension: Secondary | ICD-10-CM | POA: Diagnosis not present

## 2022-07-07 DIAGNOSIS — E785 Hyperlipidemia, unspecified: Secondary | ICD-10-CM | POA: Diagnosis not present

## 2022-07-07 DIAGNOSIS — E1169 Type 2 diabetes mellitus with other specified complication: Secondary | ICD-10-CM | POA: Diagnosis not present

## 2022-07-19 ENCOUNTER — Telehealth: Payer: Self-pay | Admitting: Pharmacist

## 2022-07-20 NOTE — Progress Notes (Signed)
[facility]) Care Management  [facility] CM Pharmacy   07/20/2022  [name] 1945-09-13 [id number]  Reason for referral: medication assistance  Referral source: Care Guide Referral medication(s): Toujeo, Mounjaro, Humalog Current insurance:[employer]  HPI: Patient was referred by [facility] Care Guide, Lenard Forth after a phone conversation about the patient's husband not being able to afford his medications.  Patient reported both she and her husband are in the donut hole.  She would like assistance with Mounjaro, Humalog, and Toujeo.   Objective: Allergies  Allergen Reactions   Other Other (See Comments)    NKDA      Medication Assistance Findings:  Medication assistance needs identified: Toujeo, Mounjaro, Humalog.  Mounjaro does not have a program.  A note was sent to [name], PharmD at [facility] to investigate about changing the patient to [facility] as it is available through [employer].  If [name] is a possibility [employer] for Humalog and Tresiba for [employer] may also be an option to keep the paperwork down to one company.  [name], PharmD was consulted.       Additional medication assistance options reviewed with patient as warranted:  No other options identified  Plan [name], PharmD responded that the changes were acceptable.  Applications will be sent to the patient as well as to [facility] for signature.  [name], PharmD, BCACP [facility] Clinical Pharmacist [phone]

## 2022-07-28 DIAGNOSIS — D2221 Melanocytic nevi of right ear and external auricular canal: Secondary | ICD-10-CM | POA: Diagnosis not present

## 2022-07-28 DIAGNOSIS — L281 Prurigo nodularis: Secondary | ICD-10-CM | POA: Diagnosis not present

## 2022-07-28 DIAGNOSIS — L720 Epidermal cyst: Secondary | ICD-10-CM | POA: Diagnosis not present

## 2022-07-28 DIAGNOSIS — D485 Neoplasm of uncertain behavior of skin: Secondary | ICD-10-CM | POA: Diagnosis not present

## 2022-07-29 ENCOUNTER — Telehealth: Payer: Self-pay | Admitting: Pharmacy Technician

## 2022-07-29 ENCOUNTER — Emergency Department (HOSPITAL_COMMUNITY): Payer: Medicare HMO

## 2022-07-29 ENCOUNTER — Inpatient Hospital Stay (HOSPITAL_COMMUNITY)
Admission: EM | Admit: 2022-07-29 | Discharge: 2022-08-04 | DRG: 488 | Disposition: A | Discharge status: Skilled Nursing Facility | Payer: Medicare HMO | Attending: Internal Medicine | Admitting: Internal Medicine

## 2022-07-29 ENCOUNTER — Other Ambulatory Visit: Payer: Self-pay

## 2022-07-29 ENCOUNTER — Encounter (HOSPITAL_COMMUNITY): Payer: Self-pay

## 2022-07-29 DIAGNOSIS — E039 Hypothyroidism, unspecified: Secondary | ICD-10-CM | POA: Diagnosis not present

## 2022-07-29 DIAGNOSIS — E1169 Type 2 diabetes mellitus with other specified complication: Secondary | ICD-10-CM | POA: Diagnosis not present

## 2022-07-29 DIAGNOSIS — Z794 Long term (current) use of insulin: Secondary | ICD-10-CM

## 2022-07-29 DIAGNOSIS — E1165 Type 2 diabetes mellitus with hyperglycemia: Secondary | ICD-10-CM | POA: Diagnosis not present

## 2022-07-29 DIAGNOSIS — Z86718 Personal history of other venous thrombosis and embolism: Secondary | ICD-10-CM | POA: Diagnosis not present

## 2022-07-29 DIAGNOSIS — W19XXXA Unspecified fall, initial encounter: Secondary | ICD-10-CM | POA: Diagnosis not present

## 2022-07-29 DIAGNOSIS — S82192A Other fracture of upper end of left tibia, initial encounter for closed fracture: Secondary | ICD-10-CM

## 2022-07-29 DIAGNOSIS — I82452 Acute embolism and thrombosis of left peroneal vein: Secondary | ICD-10-CM | POA: Diagnosis not present

## 2022-07-29 DIAGNOSIS — S82202D Unspecified fracture of shaft of left tibia, subsequent encounter for closed fracture with routine healing: Secondary | ICD-10-CM | POA: Diagnosis not present

## 2022-07-29 DIAGNOSIS — D62 Acute posthemorrhagic anemia: Secondary | ICD-10-CM | POA: Diagnosis not present

## 2022-07-29 DIAGNOSIS — G4733 Obstructive sleep apnea (adult) (pediatric): Secondary | ICD-10-CM

## 2022-07-29 DIAGNOSIS — W06XXXA Fall from bed, initial encounter: Secondary | ICD-10-CM | POA: Diagnosis present

## 2022-07-29 DIAGNOSIS — S82142A Displaced bicondylar fracture of left tibia, initial encounter for closed fracture: Principal | ICD-10-CM | POA: Diagnosis present

## 2022-07-29 DIAGNOSIS — M8580 Other specified disorders of bone density and structure, unspecified site: Secondary | ICD-10-CM | POA: Diagnosis present

## 2022-07-29 DIAGNOSIS — Z8673 Personal history of transient ischemic attack (TIA), and cerebral infarction without residual deficits: Secondary | ICD-10-CM | POA: Diagnosis not present

## 2022-07-29 DIAGNOSIS — D72829 Elevated white blood cell count, unspecified: Secondary | ICD-10-CM | POA: Diagnosis present

## 2022-07-29 DIAGNOSIS — Z7984 Long term (current) use of oral hypoglycemic drugs: Secondary | ICD-10-CM | POA: Diagnosis not present

## 2022-07-29 DIAGNOSIS — G4730 Sleep apnea, unspecified: Secondary | ICD-10-CM | POA: Diagnosis not present

## 2022-07-29 DIAGNOSIS — E785 Hyperlipidemia, unspecified: Secondary | ICD-10-CM | POA: Diagnosis present

## 2022-07-29 DIAGNOSIS — S82202A Unspecified fracture of shaft of left tibia, initial encounter for closed fracture: Secondary | ICD-10-CM | POA: Diagnosis not present

## 2022-07-29 DIAGNOSIS — Z7982 Long term (current) use of aspirin: Secondary | ICD-10-CM | POA: Diagnosis not present

## 2022-07-29 DIAGNOSIS — Z043 Encounter for examination and observation following other accident: Secondary | ICD-10-CM | POA: Diagnosis not present

## 2022-07-29 DIAGNOSIS — Z8249 Family history of ischemic heart disease and other diseases of the circulatory system: Secondary | ICD-10-CM | POA: Diagnosis not present

## 2022-07-29 DIAGNOSIS — R0689 Other abnormalities of breathing: Secondary | ICD-10-CM | POA: Diagnosis not present

## 2022-07-29 DIAGNOSIS — F5104 Psychophysiologic insomnia: Secondary | ICD-10-CM | POA: Diagnosis present

## 2022-07-29 DIAGNOSIS — I10 Essential (primary) hypertension: Secondary | ICD-10-CM | POA: Diagnosis present

## 2022-07-29 DIAGNOSIS — K219 Gastro-esophageal reflux disease without esophagitis: Secondary | ICD-10-CM

## 2022-07-29 DIAGNOSIS — Z7985 Long-term (current) use of injectable non-insulin antidiabetic drugs: Secondary | ICD-10-CM

## 2022-07-29 DIAGNOSIS — S83242A Other tear of medial meniscus, current injury, left knee, initial encounter: Secondary | ICD-10-CM | POA: Diagnosis not present

## 2022-07-29 DIAGNOSIS — M7061 Trochanteric bursitis, right hip: Secondary | ICD-10-CM | POA: Diagnosis not present

## 2022-07-29 DIAGNOSIS — I444 Left anterior fascicular block: Secondary | ICD-10-CM | POA: Diagnosis present

## 2022-07-29 DIAGNOSIS — M7989 Other specified soft tissue disorders: Secondary | ICD-10-CM | POA: Diagnosis not present

## 2022-07-29 DIAGNOSIS — R262 Difficulty in walking, not elsewhere classified: Secondary | ICD-10-CM | POA: Diagnosis not present

## 2022-07-29 DIAGNOSIS — I6389 Other cerebral infarction: Secondary | ICD-10-CM | POA: Diagnosis not present

## 2022-07-29 DIAGNOSIS — I639 Cerebral infarction, unspecified: Secondary | ICD-10-CM | POA: Diagnosis present

## 2022-07-29 DIAGNOSIS — M6281 Muscle weakness (generalized): Secondary | ICD-10-CM | POA: Diagnosis not present

## 2022-07-29 DIAGNOSIS — Z4789 Encounter for other orthopedic aftercare: Secondary | ICD-10-CM | POA: Diagnosis not present

## 2022-07-29 DIAGNOSIS — E875 Hyperkalemia: Secondary | ICD-10-CM | POA: Diagnosis present

## 2022-07-29 DIAGNOSIS — M79662 Pain in left lower leg: Secondary | ICD-10-CM | POA: Diagnosis not present

## 2022-07-29 DIAGNOSIS — Z7901 Long term (current) use of anticoagulants: Secondary | ICD-10-CM | POA: Diagnosis not present

## 2022-07-29 DIAGNOSIS — Z800 Family history of malignant neoplasm of digestive organs: Secondary | ICD-10-CM

## 2022-07-29 DIAGNOSIS — Z90710 Acquired absence of both cervix and uterus: Secondary | ICD-10-CM

## 2022-07-29 DIAGNOSIS — Z5986 Financial insecurity: Secondary | ICD-10-CM

## 2022-07-29 DIAGNOSIS — M797 Fibromyalgia: Secondary | ICD-10-CM | POA: Diagnosis present

## 2022-07-29 DIAGNOSIS — F419 Anxiety disorder, unspecified: Secondary | ICD-10-CM | POA: Diagnosis present

## 2022-07-29 DIAGNOSIS — E119 Type 2 diabetes mellitus without complications: Secondary | ICD-10-CM | POA: Diagnosis not present

## 2022-07-29 DIAGNOSIS — T84018A Broken internal joint prosthesis, other site, initial encounter: Secondary | ICD-10-CM | POA: Diagnosis not present

## 2022-07-29 DIAGNOSIS — F32A Depression, unspecified: Secondary | ICD-10-CM | POA: Diagnosis present

## 2022-07-29 DIAGNOSIS — Z79899 Other long term (current) drug therapy: Secondary | ICD-10-CM

## 2022-07-29 DIAGNOSIS — R600 Localized edema: Secondary | ICD-10-CM | POA: Diagnosis not present

## 2022-07-29 DIAGNOSIS — M80062D Age-related osteoporosis with current pathological fracture, left lower leg, subsequent encounter for fracture with routine healing: Secondary | ICD-10-CM | POA: Diagnosis not present

## 2022-07-29 DIAGNOSIS — S80919A Unspecified superficial injury of unspecified knee, initial encounter: Secondary | ICD-10-CM | POA: Diagnosis not present

## 2022-07-29 DIAGNOSIS — M25572 Pain in left ankle and joints of left foot: Secondary | ICD-10-CM | POA: Diagnosis not present

## 2022-07-29 LAB — BASIC METABOLIC PANEL
Anion gap: 11 (ref 5–15)
BUN: 17 mg/dL (ref 8–23)
CO2: 21 mmol/L — ABNORMAL LOW (ref 22–32)
Calcium: 8.9 mg/dL (ref 8.9–10.3)
Chloride: 103 mmol/L (ref 98–111)
Creatinine, Ser: 0.7 mg/dL (ref 0.44–1.00)
GFR, Estimated: >60 mL/min (ref >60)
Glucose, Bld: 188 mg/dL — ABNORMAL HIGH (ref 70–99)
Potassium: 3.7 mmol/L (ref 3.5–5.1)
Sodium: 135 mmol/L (ref 135–145)

## 2022-07-29 LAB — CBC
HCT: 37.5 % (ref 36.0–46.0)
Hemoglobin: 12.5 g/dL (ref 12.0–15.0)
MCH: 27.1 pg (ref 26.0–34.0)
MCHC: 33.3 g/dL (ref 30.0–36.0)
MCV: 81.2 fL (ref 80.0–100.0)
Platelets: 252 10*3/uL (ref 150–400)
RBC: 4.62 MIL/uL (ref 3.87–5.11)
RDW: 14.3 % (ref 11.5–15.5)
WBC: 9.4 10*3/uL (ref 4.0–10.5)
nRBC: 0 % (ref 0.0–0.2)

## 2022-07-29 LAB — HEMOGLOBIN A1C
Hgb A1c MFr Bld: 7.2 % — ABNORMAL HIGH (ref 4.8–5.6)
Mean Plasma Glucose: 159.94 mg/dL

## 2022-07-29 LAB — GLUCOSE, CAPILLARY: Glucose-Capillary: 126 mg/dL — ABNORMAL HIGH (ref 70–99)

## 2022-07-29 LAB — CBG MONITORING, ED: Glucose-Capillary: 152 mg/dL — ABNORMAL HIGH (ref 70–99)

## 2022-07-29 MED ORDER — METOPROLOL SUCCINATE ER 50 MG PO TB24
50.0000 mg | ORAL_TABLET | Freq: Every day | ORAL | Status: DC
  Administered 2022-07-29 – 2022-08-04 (×7): 50 mg via ORAL
  Filled 2022-07-29 (×7): qty 1

## 2022-07-29 MED ORDER — ONDANSETRON HCL 4 MG/2ML IJ SOLN
4.0000 mg | Freq: Four times a day (QID) | INTRAMUSCULAR | Status: DC | PRN
  Administered 2022-07-30: 4 mg via INTRAVENOUS
  Filled 2022-07-29: qty 2

## 2022-07-29 MED ORDER — AMLODIPINE BESYLATE 5 MG PO TABS
5.0000 mg | ORAL_TABLET | Freq: Every day | ORAL | Status: DC
  Administered 2022-07-29 – 2022-08-04 (×7): 5 mg via ORAL
  Filled 2022-07-29 (×7): qty 1

## 2022-07-29 MED ORDER — INSULIN ASPART 100 UNIT/ML IJ SOLN
0.0000 [IU] | Freq: Every day | INTRAMUSCULAR | Status: DC
  Administered 2022-07-30: 3 [IU] via SUBCUTANEOUS

## 2022-07-29 MED ORDER — DOCUSATE SODIUM 100 MG PO CAPS
100.0000 mg | ORAL_CAPSULE | Freq: Two times a day (BID) | ORAL | Status: DC
  Administered 2022-07-29 – 2022-07-30 (×2): 100 mg via ORAL
  Filled 2022-07-29 (×2): qty 1

## 2022-07-29 MED ORDER — ONDANSETRON HCL 4 MG PO TABS: 4.0000 mg | ORAL_TABLET | Freq: Four times a day (QID) | ORAL | Status: DC | PRN

## 2022-07-29 MED ORDER — OXYCODONE HCL 5 MG PO TABS
5.0000 mg | ORAL_TABLET | ORAL | Status: DC | PRN
  Administered 2022-07-29 – 2022-08-04 (×10): 5 mg via ORAL
  Filled 2022-07-29 (×11): qty 1

## 2022-07-29 MED ORDER — FENTANYL CITRATE PF 50 MCG/ML IJ SOSY
50.0000 ug | PREFILLED_SYRINGE | Freq: Once | INTRAMUSCULAR | Status: AC
  Administered 2022-07-29: 50 ug via INTRAVENOUS
  Filled 2022-07-29: qty 1

## 2022-07-29 MED ORDER — PANTOPRAZOLE SODIUM 40 MG PO TBEC
40.0000 mg | DELAYED_RELEASE_TABLET | Freq: Every day | ORAL | Status: DC
  Administered 2022-07-29 – 2022-08-04 (×7): 40 mg via ORAL
  Filled 2022-07-29 (×7): qty 1

## 2022-07-29 MED ORDER — DULOXETINE HCL 30 MG PO CPEP
30.0000 mg | ORAL_CAPSULE | Freq: Every day | ORAL | Status: DC
  Administered 2022-07-29 – 2022-08-04 (×7): 30 mg via ORAL
  Filled 2022-07-29 (×7): qty 1

## 2022-07-29 MED ORDER — SENNOSIDES-DOCUSATE SODIUM 8.6-50 MG PO TABS
1.0000 | ORAL_TABLET | Freq: Every evening | ORAL | Status: DC | PRN
  Administered 2022-08-03: 1 via ORAL
  Filled 2022-07-29: qty 1

## 2022-07-29 MED ORDER — INSULIN GLARGINE-YFGN 100 UNIT/ML ~~LOC~~ SOLN
30.0000 [IU] | Freq: Every day | SUBCUTANEOUS | Status: DC
  Administered 2022-07-29 – 2022-08-04 (×6): 30 [IU] via SUBCUTANEOUS
  Filled 2022-07-29: qty 0.3
  Filled 2022-07-29: qty 10
  Filled 2022-07-29 (×7): qty 0.3

## 2022-07-29 MED ORDER — TRAZODONE HCL 50 MG PO TABS
50.0000 mg | ORAL_TABLET | Freq: Every evening | ORAL | Status: DC | PRN
  Administered 2022-08-03: 50 mg via ORAL
  Filled 2022-07-29: qty 1

## 2022-07-29 MED ORDER — ACETAMINOPHEN 325 MG PO TABS: 650.0000 mg | ORAL_TABLET | Freq: Four times a day (QID) | ORAL | Status: DC | PRN

## 2022-07-29 MED ORDER — ACETAMINOPHEN 650 MG RE SUPP: 650.0000 mg | Freq: Four times a day (QID) | RECTAL | Status: DC | PRN

## 2022-07-29 MED ORDER — ROSUVASTATIN CALCIUM 20 MG PO TABS
20.0000 mg | ORAL_TABLET | Freq: Every day | ORAL | Status: DC
  Administered 2022-07-29 – 2022-08-03 (×6): 20 mg via ORAL
  Filled 2022-07-29 (×6): qty 1

## 2022-07-29 MED ORDER — MORPHINE SULFATE (PF) 2 MG/ML IV SOLN
2.0000 mg | INTRAVENOUS | Status: DC | PRN
  Administered 2022-07-29 – 2022-07-30 (×4): 2 mg via INTRAVENOUS
  Filled 2022-07-29 (×4): qty 1

## 2022-07-29 MED ORDER — HYDRALAZINE HCL 20 MG/ML IJ SOLN: 5.0000 mg | INTRAMUSCULAR | Status: DC | PRN

## 2022-07-29 MED ORDER — ONDANSETRON HCL 4 MG/2ML IJ SOLN
4.0000 mg | Freq: Once | INTRAMUSCULAR | Status: AC
  Administered 2022-07-29: 4 mg via INTRAVENOUS
  Filled 2022-07-29: qty 2

## 2022-07-29 MED ORDER — MAGNESIUM CITRATE PO SOLN: 1.0000 | Freq: Once | ORAL | Status: DC | PRN

## 2022-07-29 MED ORDER — KETOROLAC TROMETHAMINE 30 MG/ML IJ SOLN
30.0000 mg | Freq: Once | INTRAMUSCULAR | Status: AC
  Administered 2022-07-29: 30 mg via INTRAVENOUS
  Filled 2022-07-29: qty 1

## 2022-07-29 MED ORDER — IRBESARTAN 300 MG PO TABS
300.0000 mg | ORAL_TABLET | Freq: Every day | ORAL | Status: DC
  Administered 2022-07-30 – 2022-08-04 (×6): 300 mg via ORAL
  Filled 2022-07-29 (×7): qty 1

## 2022-07-29 MED ORDER — INSULIN GLARGINE (1 UNIT DIAL) 300 UNIT/ML ~~LOC~~ SOPN: 30.0000 [IU] | PEN_INJECTOR | Freq: Every day | SUBCUTANEOUS | Status: DC

## 2022-07-29 MED ORDER — BISACODYL 10 MG RE SUPP
10.0000 mg | Freq: Every day | RECTAL | Status: DC | PRN
  Administered 2022-08-01: 10 mg via RECTAL
  Filled 2022-07-29: qty 1

## 2022-07-29 MED ORDER — INSULIN ASPART 100 UNIT/ML IJ SOLN
0.0000 [IU] | Freq: Three times a day (TID) | INTRAMUSCULAR | Status: DC
  Administered 2022-07-30: 2 [IU] via SUBCUTANEOUS
  Administered 2022-07-30 – 2022-08-01 (×5): 3 [IU] via SUBCUTANEOUS
  Administered 2022-08-01 – 2022-08-02 (×2): 2 [IU] via SUBCUTANEOUS
  Administered 2022-08-02: 8 [IU] via SUBCUTANEOUS
  Administered 2022-08-03 (×3): 3 [IU] via SUBCUTANEOUS
  Administered 2022-08-04: 5 [IU] via SUBCUTANEOUS

## 2022-07-29 MED ORDER — ASPIRIN 81 MG PO CHEW
81.0000 mg | CHEWABLE_TABLET | Freq: Every day | ORAL | Status: DC
  Administered 2022-07-29 – 2022-08-02 (×5): 81 mg via ORAL
  Filled 2022-07-29 (×5): qty 1

## 2022-07-29 NOTE — ED Provider Notes (Signed)
Patient is a 77 year old female presenting after having an accidental fall when she was standing on the bed try to kill a fly on the ceiling.  She fell to the ground, she does not remember exactly how she fell but has significant knee pain on the left.  Not able to bear weight, there is associated swelling, immobilized by paramedics prior to arrival.  The patient does have a history of diabetes insulin requiring as well as hypertension.  On my exam the patient has no signs of head injury neck pain arm or leg pain except for the left lower extremity where there is tenderness and swelling around the left knee with an obvious hemarthrosis, the left ankle appears normal without any significant signs of trauma but she does have pain so imaging will be obtained from the knee through the ankle.  I personally viewed the x-ray of the left knee which shows that she likely has a tibial plateau fracture, it appears complicated and comminuted, will order CT scan and consult with orthopedic surgery.  Pt will be signed out at change of shift - to oncoming EDP team to consult with ortho - pt will be non ambulatory - lives in second story apartment - and can't walk or bear weight.   Medical screening examination/treatment/procedure(s) were conducted as a shared visit with non-physician practitioner(s) and myself.  I personally evaluated the patient during the encounter.  Clinical Impression:   Final diagnoses:  Closed fracture of left tibial plateau, initial encounter       Final diagnoses:  Closed fracture of left tibial plateau, initial encounter      Eber Hong, MD 07/29/22 [phone]

## 2022-07-29 NOTE — ED Provider Notes (Signed)
Accepted handoff at shift change from [facility]. Please see prior provider note for more detail.   Briefly: Patient is 77 y.o. presenting for mechanical fall and left knee injury.  DDX: concern for fracture, dislocation, compartment syndrome  X-ray revealed concern for left tibial plateau fracture  Plan: Consult Ortho, likely dispo will be admitted for pain management and Ortho evaluation   Physical Exam  BP (!) 147/83 (BP Location: Left Arm)   Pulse 79   Temp 98.7 F (37.1 C) (Oral)   Resp 16   Ht 5\' 3"  (1.6 m)   Wt 78.9 kg   SpO2 97%   BMI 30.82 kg/m   Physical Exam  Procedures  Procedures  ED Course / MDM    Medical Decision Making Amount and/or Complexity of Data Reviewed Labs: ordered. Radiology: ordered.  Risk Prescription drug management.   Spoke to [employer], [place] who advised to admit with plans to transfer to [facility].  Advised n.p.o. at midnight.  I personally reassessed patient and found left leg to be neurovascularly intact.  Treated her pain with another 50 mcg of fentanyl and Toradol.  Admitted to hospital service for continued management with Dr. Randol Kern.        Gareth Eagle, PA-C 07/29/22 1705    Benjiman Core, MD 07/30/22 [phone]

## 2022-07-29 NOTE — Progress Notes (Signed)
[facility])                                            [facility] Quality Pharmacy Team    07/29/2022  [name] 19-Sep-1945 [id number]                                      Medication Assistance Referral  Referral From: [facility] Katina B.  Medication/Company: Rubbie Battiest  / [employer] Patient application portion:  Mining engineer portion: Faxed  to Dr. Geoffry Paradise [facility], [facility]) Provider address/fax verified via: Office website  Medication/Company: Franki Monte / [employer] Patient application portion:  Mining engineer portion: Faxed  to Dr. Geoffry Paradise [facility] C[facility]) Provider address/fax verified via: Office website  Medication/Company: Evaristo Bury / [employer] Patient application portion:  Mailed Provider application portion: Faxed  to Dr. Geoffry Paradise [facility], CPP) Provider address/fax verified via: Office website    Tunis Gentle P. Burgess Sheriff, CPhT [facility] [employer]  [phone]

## 2022-07-29 NOTE — ED Provider Notes (Signed)
[facility]ERGENCY DEPARTMENT AT [facility] Provider Note   CSN: [id number] Arrival date & time: 07/29/22  1347     History  Chief Complaint  Patient presents with   [name] is a 77 y.o. female who presented today via EMS for left knee and ankle pain. Patient reports that she was standing on the bed roughly 2 hours ago, trying to kill a fly on the ceiling, when she fell off the bed and onto the ground. She denies hitting her head but cannot describe how she fell. Patient states that she could not get off the ground secondary to pain. Pain is 8/10 at rest. She was not given any pain medication by EMS.      Home Medications Prior to Admission medications   Medication Sig Start Date End Date Taking? Authorizing Provider  acetaminophen (TYLENOL) 500 MG tablet Take 1 tablet (500 mg total) by mouth every 6 (six) hours as needed. 01/20/19   Merrilee Jansky, MD  amLODipine (NORVASC) 5 MG tablet Take 5 mg by mouth daily. 08/30/20   [provider]  aspirin 81 MG chewable tablet Chew 81 mg by mouth daily.    [provider]  DULoxetine (CYMBALTA) 30 MG capsule Take 1 capsule by mouth daily.    [provider]  insulin glargine, 1 Unit Dial, (TOUJEO SOLOSTAR) 300 UNIT/ML Solostar Pen inject 60 units Subcutaneous daily    [provider]  insulin lispro (HUMALOG) 100 UNIT/ML injection Inject 8-10 Units into the skin 3 (three) times daily before meals.    [provider]  irbesartan (AVAPRO) 300 MG tablet Take 300 mg by mouth daily.    [provider]  Methylcobalamin (B12) 5000 MCG SUBL Place 1 tablet under the tongue daily.    [provider]  metoprolol succinate (TOPROL-XL) 50 MG 24 hr tablet Take 50 mg by mouth daily. 09/30/20   [provider]  omeprazole (PRILOSEC) 20 MG capsule Take 20 mg by mouth daily. 12/28/19   [provider]  rosuvastatin (CRESTOR) 20 MG tablet Take 20 mg by  mouth at bedtime. 02/08/19   [provider]  tirzepatide Greggory Keen) 10 MG/0.5ML Pen Inject 10 mg into the skin once a week.    [provider]  TOUJEO SOLOSTAR 300 UNIT/ML Solostar Pen Inject 60 Units into the skin daily. 09/23/20   [provider]  triamterene-hydrochlorothiazide (MAXZIDE-25) 37.5-25 MG tablet daily.    [provider]      Allergies    Other    Review of Systems   Review of Systems  Musculoskeletal:        Left knee pain   All other systems reviewed and are negative.   Physical Exam Updated Vital Signs BP (!) 147/83 (BP Location: Left Arm)   Pulse 79   Temp 98.7 F (37.1 C) (Oral)   Resp 16   Ht 5\' 3"  (1.6 m)   Wt 78.9 kg   SpO2 97%   BMI 30.82 kg/m  Physical Exam Vitals and nursing note reviewed.  Constitutional:      Appearance: Normal appearance.  HENT:     Head: Normocephalic and atraumatic.     Mouth/Throat:     Mouth: Mucous membranes are moist.  Eyes:     Conjunctiva/sclera: Conjunctivae normal.     Pupils: Pupils are equal, round, and reactive to light.  Cardiovascular:     Rate and Rhythm: Normal rate and regular rhythm.  Pulses: Normal pulses.  Pulmonary:     Effort: Pulmonary effort is normal.     Breath sounds: Normal breath sounds.  Abdominal:     Palpations: Abdomen is soft.     Tenderness: There is no abdominal tenderness.  Musculoskeletal:        General: Swelling and tenderness present.     Cervical back: No rigidity or tenderness.     Comments: Pain and tenderness to medial and lateral aspect of left knee as well as down medial aspect of the shin to the medial ankle  Skin:    General: Skin is warm and dry.     Capillary Refill: Capillary refill takes less than 2 seconds.     Findings: No rash.  Neurological:     General: No focal deficit present.     Mental Status: She is alert.  Psychiatric:        Mood and Affect: Mood normal.        Behavior: Behavior normal.     ED Results  / Procedures / Treatments   Labs (all labs ordered are listed, but only abnormal results are displayed) Labs Reviewed  BASIC METABOLIC PANEL  CBC    EKG None  Radiology No results found.  Procedures Procedures    Medications Ordered in ED Medications  fentaNYL (SUBLIMAZE) injection 50 mcg (has no administration in time range)    ED Course/ Medical Decision Making/ A&P                             Medical Decision Making Amount and/or Complexity of Data Reviewed Labs: ordered. Radiology: ordered.  Risk Prescription drug management.   This patient presents to the ED for concern of left knee pain after falling off the bed, this involves an extensive number of treatment options, and is a complaint that carries with it a high risk of complications and morbidity.  The differential diagnosis includes left patellar dislocation vs fracture, ankle fracture, tib/fib fracture.   Co morbidities that complicate the patient evaluation  Diabetes Mellitus Fibromyalgia Osteopenia Stroke   Additional history obtained:  Additional history obtained from patient and EMS.  Lab Tests:  Labs are pending at shift change.   Imaging Studies ordered:  I ordered imaging studies including left knee, tib/fib, and ankle x-rays. Chest x-ray in case inpatient surgery is required. CT left knee was ordered after knee x-ray revealed tibial plateau fracture.  I agree with the radiologist interpretation   Cardiac Monitoring: / EKG:  The patient was maintained on a cardiac monitor.  I personally viewed and interpreted the cardiac monitored which showed an underlying rhythm of: sinus rhythm with heart rate of 71 bpm.   Problem List / ED Course / Critical interventions / Medication management  Left knee, shin, and ankle pain after fall off of bed. I ordered medication including Fentanyl for pain.  Reevaluation of the patient after these medicines showed that the patient improved I  have reviewed the patients home medicines and have made adjustments as needed   Test / Admission - Considered:  Left knee x-ray showed tibial plateau fracture. CT pending at time of shift change. Pending results, ortho will be consulted.        Final Clinical Impression(s) / ED Diagnoses Final diagnoses:  None    Rx / DC Orders ED Discharge Orders     None         Maxwell Marion, PA-C 07/29/22  1503    Eber Hong, MD 07/29/22 [phone]

## 2022-07-29 NOTE — H&P (Signed)
TRH H&P   Patient Demographics:    [name], is a 77 y.o. female  MRN: [id number]   DOB - 08/12/1945  Admit Date - 07/29/2022  Outpatient Primary MD for the patient is Geoffry Paradise, MD  Referring MD/NP/PA: Cecil Cobbs  Outpatient Specialists: primary ortho Dr Carola Frost.    Patient coming from: home  Chief Complaint  Patient presents with   Fall      HPI:    [name]  is a 77 y.o. female, with past medical history of CVA, hypertension, hyperlipidemia, GERD, OSA, patient presents to ED secondary to fall, she was brought via EMS, secondary to fall, for left knee and ankle pain, patient reports that she was standing on the bed, trying to go fly on the ceiling, she fell off on the bed, and onto the ground, she denies any head trauma, loss of consciousness, she could not get off the ground secondary to pain, mainly in the left ankle area, where EMS were called. - in EC workup significant for glucose of 188, imaging significant for proximal tibial fracture, for which orthopedic has been consulted and recommended admission to [facility], n.p.o. after midnight for surgery tomorrow     Review of systems:     A full 10 point Review of Systems was done, except as stated above, all other Review of Systems were negative.   With Past History of the following :    Past Medical History:  Diagnosis Date   Anxiety    Depression    Essential hypertension    Eustachian tube dysfunction    Fibromyalgia    GERD (gastroesophageal reflux disease)    Hyperlipidemia    Hypothyroid    Mild carotid artery disease (HCC)    OSA (obstructive sleep apnea)    Osteopenia    Stroke (HCC)    Type 2 diabetes mellitus (HCC)       Past Surgical History:  Procedure Laterality Date   ANKLE SURGERY     Anteriorvesicourethropexy     BUNIONECTOMY     COLONOSCOPY     NASAL  SINUS SURGERY     PATELLA FRACTURE SURGERY  2009   Right   TONSILLECTOMY     TOTAL ABDOMINAL HYSTERECTOMY     WRIST FRACTURE SURGERY  2009      Social History:     Social History   Tobacco Use   Smoking status: Never   Smokeless tobacco: Never  Substance Use Topics   Alcohol use: No        Family History :     Family History  Problem Relation Age of Onset   Heart attack Mother    Colon cancer Father      Home Medications:   Prior to Admission medications   Medication Sig Start Date End Date Taking? Authorizing Provider  acetaminophen (TYLENOL) 500 MG tablet Take 1 tablet (  500 mg total) by mouth every 6 (six) hours as needed. 01/20/19  Yes Lamptey, Britta Mccreedy, MD  amLODipine (NORVASC) 5 MG tablet Take 5 mg by mouth daily. 08/30/20  Yes [provider]  aspirin 81 MG chewable tablet Chew 81 mg by mouth daily.   Yes [provider]  DULoxetine (CYMBALTA) 30 MG capsule Take 1 capsule by mouth daily.   Yes [provider]  insulin lispro (HUMALOG) 100 UNIT/ML injection Inject 5-10 Units into the skin 3 (three) times daily before meals.   Yes [provider]  irbesartan (AVAPRO) 300 MG tablet Take 300 mg by mouth daily.   Yes [provider]  Methylcobalamin (B12) 5000 MCG SUBL Place 1 tablet under the tongue daily.   Yes [provider]  metoprolol succinate (TOPROL-XL) 50 MG 24 hr tablet Take 50 mg by mouth daily. 09/30/20  Yes [provider]  omeprazole (PRILOSEC) 20 MG capsule Take 20 mg by mouth daily. 12/28/19  Yes [provider]  rosuvastatin (CRESTOR) 20 MG tablet Take 20 mg by mouth at bedtime. 02/08/19  Yes [provider]  tirzepatide Greggory Keen) 10 MG/0.5ML Pen Inject 10 mg into the skin once a week.   Yes [provider]  TOUJEO SOLOSTAR 300 UNIT/ML Solostar Pen Inject 60 Units into the skin daily. 09/23/20  Yes [provider]  triamterene-hydrochlorothiazide  (MAXZIDE-25) 37.5-25 MG tablet daily.   Yes [provider]     Allergies:     Allergies  Allergen Reactions   Other Other (See Comments)    NKDA     Physical Exam:   Vitals  Blood pressure (!) 147/83, pulse 79, temperature 98.7 F (37.1 C), temperature source Oral, resp. rate 16, height 5\' 3"  (1.6 m), weight 78.9 kg, SpO2 97 %.   1. General developed female, laying in bed, no apparent distress  2. Normal affect and insight, Not Suicidal or Homicidal, Awake Alert, Oriented X 3.  3. No F.N deficits, ALL C.Nerves Intact, Strength 5/5 all 4 extremities, Sensation intact all 4 extremities, Plantars down going.  4. Ears and Eyes appear Normal, Conjunctivae clear, PERRLA. Moist Oral Mucosa.  5. Supple Neck, No JVD, No cervical lymphadenopathy appriciated, No Carotid Bruits.  6. Symmetrical Chest wall movement, Good air movement bilaterally, CTAB.  7. RRR, No Gallops, Rubs or Murmurs, No Parasternal Heave.  8. Positive Bowel Sounds, Abdomen Soft, No tenderness, No organomegaly appriciated,No rebound -guarding or rigidity.  9.  No Cyanosis, Normal Skin Turgor, No Skin Rash or Bruise.  10. Good muscle tone, tenderness in the left knee, but pulses felt bilaterally, good sensation.     Data Review:    CBC Recent Labs  Lab 07/29/22 1449  WBC 9.4  HGB 12.5  HCT 37.5  PLT 252  MCV 81.2  MCH 27.1  MCHC 33.3  RDW 14.3   ------------------------------------------------------------------------------------------------------------------  Chemistries  Recent Labs  Lab 07/29/22 1449  NA 135  K 3.7  CL 103  CO2 21*  GLUCOSE 188*  BUN 17  CREATININE 0.70  CALCIUM 8.9   ------------------------------------------------------------------------------------------------------------------ estimated creatinine clearance is 60.4 mL/min (by C-G formula based on SCr of 0.7  mg/dL). ------------------------------------------------------------------------------------------------------------------ No results for input(s): "TSH", "T4TOTAL", "T3FREE", "THYROIDAB" in the last 72 hours.  Invalid input(s): "FREET3"  Coagulation profile No results for input(s): "INR", "PROTIME" in the last 168 hours. ------------------------------------------------------------------------------------------------------------------- No results for input(s): "DDIMER" in the last 72 hours. -------------------------------------------------------------------------------------------------------------------  Cardiac Enzymes No results for input(s): "CKMB", "TROPONINI", "MYOGLOBIN" in the  last 168 hours.  Invalid input(s): "CK" ------------------------------------------------------------------------------------------------------------------ No results found for: "BNP"   ---------------------------------------------------------------------------------------------------------------  Urinalysis    Component Value Date/Time   COLORURINE YELLOW 07/14/2007 1142   APPEARANCEUR CLEAR 07/14/2007 1142   LABSPEC 1.007 07/14/2007 1142   PHURINE 7.0 07/14/2007 1142   GLUCOSEU NEGATIVE 07/14/2007 1142   HGBUR NEGATIVE 07/14/2007 1142   BILIRUBINUR NEGATIVE 07/14/2007 1142   KETONESUR NEGATIVE 07/14/2007 1142   PROTEINUR NEGATIVE 07/14/2007 1142   UROBILINOGEN 0.2 07/14/2007 1142   NITRITE NEGATIVE 07/14/2007 1142   LEUKOCYTESUR  07/14/2007 1142    NEGATIVE MICROSCOPIC NOT DONE ON URINES WITH NEGATIVE PROTEIN, BLOOD, LEUKOCYTES, NITRITE, OR GLUCOSE <1000 mg/dL.    ----------------------------------------------------------------------------------------------------------------   Imaging Results:    CT Knee Left Wo Contrast  Result Date: 07/29/2022 CLINICAL DATA:  Knee trauma, tibial plateau fracture. Fell off the bed. The the EXAM: CT OF THE LEFT KNEE WITHOUT CONTRAST TECHNIQUE: Multidetector  CT imaging of the left knee was performed according to the standard protocol. Multiplanar CT image reconstructions were also generated. RADIATION DOSE REDUCTION: This exam was performed according to the departmental dose-optimization program which includes automated exposure control, adjustment of the mA and/or kV according to patient size and/or use of iterative reconstruction technique. COMPARISON:  Radiograph performed earlier on the same date FINDINGS: Bones/Joint/Cartilage There is a comminuted fracture of the both medial and lateral tibial plateau predominantly involving the medial tibial plateau. There is a displaced wedge-shaped fracture of the medial malleolus with a proximally 2.5 cm displacement at the articular surface. The large anterior fracture fragment measures a proximally 3.2 x 2.0 cm and is anterosuperiorly displaced. There is also mildly depressed fracture of the posterior aspect of the lateral tibial plateau. Comminuted fracture of the tibial plateau also involves the tibial spine. There is moderate suprapatellar joint effusion with fat fluid levels. The patella, distal femur and fibula appear intact. Ligaments Suboptimally assessed by CT. Muscles and Tendons Muscles are normal in bulk. No intramuscular hematoma or fluid collection. Soft tissues Soft tissue edema about the medial aspect of the proximal tibia and femur. No fluid collection. IMPRESSION: 1. Comminuted fracture of the both medial and lateral tibial plateau predominantly involving the medial tibial plateau (Schatzker type 5), as detailed above. 2. The fracture involves the tibial spine about the insertion of the cruciate ligaments concerning for ligamentous instability. 3. Moderate suprapatellar joint effusion with fat fluid levels. Electronically Signed   By: Larose Hires D.O.   On: 07/29/2022 16:17   DG Chest Portable 1 View  Result Date: 07/29/2022 CLINICAL DATA:  Fall EXAM: PORTABLE CHEST 1 VIEW COMPARISON:  Chest x-ray dated  July 07, 2020 FINDINGS: The heart size and mediastinal contours are within normal limits. Both lungs are clear. The visualized skeletal structures are unremarkable. IMPRESSION: No active disease. Electronically Signed   By: Allegra Lai M.D.   On: 07/29/2022 15:23   DG Ankle Complete Left  Result Date: 07/29/2022 CLINICAL DATA:  LEFT ankle pain post fall today EXAM: LEFT ANKLE COMPLETE - 3+ VIEW COMPARISON:  None available FINDINGS: See accompanying LEFT tibial/fibular exam for description of medial tibial plateau fracture. Ankle joint alignment normal. Osseous mineralization normal. No ankle fracture, dislocation, or bone destruction. K-wire at first metatarsal. IMPRESSION: No ankle abnormalities. Electronically Signed   By: Ulyses Southward M.D.   On: 07/29/2022 15:04   DG Tibia/Fibula Left  Result Date: 07/29/2022 CLINICAL DATA:  LEFT knee and ankle pain post fall today EXAM: LEFT TIBIA AND FIBULA - 2  VIEW COMPARISON:  None Available. FINDINGS: Osseous demineralization. Comminuted depressed fracture of the medial LEFT tibial plateau extending into tibial spines. Dominant articular fracture fragment appears depressed and displaced anteriorly. No additional fracture, dislocation, or bone destruction. Associated knee joint effusion. IMPRESSION: Comminuted displaced intra-articular fracture at LEFT medial tibial plateau with associated joint effusion. Electronically Signed   By: Ulyses Southward M.D.   On: 07/29/2022 15:02    EKG: changes PR interval 226 ms QRS duration 89 ms QT/QTcB 402/437 ms P-R-T axes 21 -52 42 Sinus rhythm Prolonged PR interval LAD, consider left anterior fascicular block   Assessment & Plan:    Principal Problem:   Left tibial fracture Active Problems:   Obstructive sleep apnea   HYPERTENSION   G E R D   Chronic insomnia   CVA (cerebral vascular accident) (HCC)  Left knee fracture -Secondary to mechanical fall, she sustained Comminuted fracture of the both medial and  lateral tibial plateau predominantly involving the medial tibial plateau (Schatzker type 5), as detailed above. -Will be admitted to [facility], in anticipation for orthopedic repair tomorrow, ED discussed with Earney Hamburg, with recommendation n.p.o. after midnight, and Dr. Jena Gauss will see patient in a.m. -Continue with and pain medications -Continue with IV fluids -Continue with metoprolol perioperatively -Now SCD for DVT prophylaxis, anticipation for surgery tomorrow, pharmacologic prophylaxis per orthopedic tomorrow after surgery.  Hypertension -Blood pressure mildly elevated, most likely due to pain, resume home regimen, will add as needed hydralazine  History of CVA -Continue with aspirin, statin  Diabetes mellitus -Resume Toujeo at a lower dose 60>> 30 units, will add insulin sliding scale, will hold Mounjaro during hospital stay  GERD -Continue with PPI  OSA -Continue with CPAP    DVT Prophylaxis SCDs   AM Labs Ordered, also please review Full Orders  Family Communication: Admission, patients condition and plan of care including tests being ordered have been discussed with the patient who indicate understanding and agree with the plan and Code Status.  Code Status Full  Likely DC to  home  Condition GUARDED    Consults called: ortho by ED     Admission status: inpatient    Time spent in minutes : inpatient    Huey Bienenstock M.D on 07/29/2022 at 5:21 PM   [facility] - Office  [phone]

## 2022-07-29 NOTE — ED Notes (Signed)
ED TO INPATIENT HANDOFF REPORT  ED Nurse Name and Phone #: Morrie Sheldon [phone]  S Name/Age/Gender [name] 77 y.o. female Room/Bed: APAH7/APAH7  Code Status   Code Status: Not on file  Home/SNF/Other Home Patient oriented to: self, place, time, and situation Is this baseline? Yes   Triage Complete: Triage complete  Chief Complaint Left tibial fracture [S82.202A]  Triage Note No notes on file   Allergies Allergies  Allergen Reactions   Other Other (See Comments)    NKDA    Level of Care/Admitting Diagnosis ED Disposition     ED Disposition  Admit   Condition  --   Comment  Hospital Area: [facility] [[id number]]  Level of Care: Med-Surg [16]  May admit patient to [facility] or [facility] if equivalent level of care is available:: No  Covid Evaluation: Asymptomatic - no recent exposure (last 10 days) testing not required  Diagnosis: Left tibial fracture [960454]  Admitting Physician: Chiquita Loth  Attending Physician: Randol Kern, DAWOOD S [[id number]]  Certification:: I certify this patient will need inpatient services for at least 2 midnights  Estimated Length of Stay: 2          B Medical/Surgery History Past Medical History:  Diagnosis Date   Anxiety    Depression    Essential hypertension    Eustachian tube dysfunction    Fibromyalgia    GERD (gastroesophageal reflux disease)    Hyperlipidemia    Hypothyroid    Mild carotid artery disease (HCC)    OSA (obstructive sleep apnea)    Osteopenia    Stroke (HCC)    Type 2 diabetes mellitus (HCC)    Past Surgical History:  Procedure Laterality Date   ANKLE SURGERY     Anteriorvesicourethropexy     BUNIONECTOMY     COLONOSCOPY     NASAL SINUS SURGERY     PATELLA FRACTURE SURGERY  2009   Right   TONSILLECTOMY     TOTAL ABDOMINAL HYSTERECTOMY     WRIST FRACTURE SURGERY  2009     A IV Location/Drains/Wounds Patient Lines/Drains/Airways Status     Active  Line/Drains/Airways     Name Placement date Placement time Site Days   Peripheral IV 07/29/22 20 G 1" Left Antecubital 07/29/22  1448  Antecubital  less than 1            Intake/Output Last 24 hours No intake or output data in the 24 hours ending 07/29/22 1710  Labs/Imaging Results for orders placed or performed during the hospital encounter of 07/29/22 (from the past 48 hour(s))  Basic metabolic panel     Status: Abnormal   Collection Time: 07/29/22  2:49 PM  Result Value Ref Range   Sodium 135 135 - 145 mmol/L   Potassium 3.7 3.5 - 5.1 mmol/L   Chloride 103 98 - 111 mmol/L   CO2 21 (L) 22 - 32 mmol/L   Glucose, Bld 188 (H) 70 - 99 mg/dL    Comment: Glucose reference range applies only to samples taken after fasting for at least 8 hours.   BUN 17 8 - 23 mg/dL   Creatinine, Ser 0.98 0.44 - 1.00 mg/dL   Calcium 8.9 8.9 - 11.9 mg/dL   GFR, Estimated >14 >78 mL/min    Comment: (NOTE) Calculated using the CKD-EPI Creatinine Equation (2021)    Anion gap 11 5 - 15    Comment: Performed at [facility], [address], [place], [place] [address]  CBC     Status: None   Collection Time: 07/29/22  2:49 PM  Result Value Ref Range   WBC 9.4 4.0 - 10.5 K/uL   RBC 4.62 3.87 - 5.11 MIL/uL   Hemoglobin 12.5 12.0 - 15.0 g/dL   HCT 62.9 52.8 - 41.3 %   MCV 81.2 80.0 - 100.0 fL   MCH 27.1 26.0 - 34.0 pg   MCHC 33.3 30.0 - 36.0 g/dL   RDW 24.4 01.0 - 27.2 %   Platelets 252 150 - 400 K/uL   nRBC 0.0 0.0 - 0.2 %    Comment: Performed at [facility], [address], [place], [place] [address]   CT Knee Left Wo Contrast  Result Date: 07/29/2022 CLINICAL DATA:  Knee trauma, tibial plateau fracture. Fell off the bed. The the EXAM: CT OF THE LEFT KNEE WITHOUT CONTRAST TECHNIQUE: Multidetector CT imaging of the left knee was performed according to the standard protocol. Multiplanar CT image reconstructions were also generated. RADIATION DOSE REDUCTION: This exam was performed according to the  departmental dose-optimization program which includes automated exposure control, adjustment of the mA and/or kV according to patient size and/or use of iterative reconstruction technique. COMPARISON:  Radiograph performed earlier on the same date FINDINGS: Bones/Joint/Cartilage There is a comminuted fracture of the both medial and lateral tibial plateau predominantly involving the medial tibial plateau. There is a displaced wedge-shaped fracture of the medial malleolus with a proximally 2.5 cm displacement at the articular surface. The large anterior fracture fragment measures a proximally 3.2 x 2.0 cm and is anterosuperiorly displaced. There is also mildly depressed fracture of the posterior aspect of the lateral tibial plateau. Comminuted fracture of the tibial plateau also involves the tibial spine. There is moderate suprapatellar joint effusion with fat fluid levels. The patella, distal femur and fibula appear intact. Ligaments Suboptimally assessed by CT. Muscles and Tendons Muscles are normal in bulk. No intramuscular hematoma or fluid collection. Soft tissues Soft tissue edema about the medial aspect of the proximal tibia and femur. No fluid collection. IMPRESSION: 1. Comminuted fracture of the both medial and lateral tibial plateau predominantly involving the medial tibial plateau (Schatzker type 5), as detailed above. 2. The fracture involves the tibial spine about the insertion of the cruciate ligaments concerning for ligamentous instability. 3. Moderate suprapatellar joint effusion with fat fluid levels. Electronically Signed   By: Larose Hires D.O.   On: 07/29/2022 16:17   DG Chest Portable 1 View  Result Date: 07/29/2022 CLINICAL DATA:  Fall EXAM: PORTABLE CHEST 1 VIEW COMPARISON:  Chest x-ray dated July 07, 2020 FINDINGS: The heart size and mediastinal contours are within normal limits. Both lungs are clear. The visualized skeletal structures are unremarkable. IMPRESSION: No active disease.  Electronically Signed   By: Allegra Lai M.D.   On: 07/29/2022 15:23   DG Ankle Complete Left  Result Date: 07/29/2022 CLINICAL DATA:  LEFT ankle pain post fall today EXAM: LEFT ANKLE COMPLETE - 3+ VIEW COMPARISON:  None available FINDINGS: See accompanying LEFT tibial/fibular exam for description of medial tibial plateau fracture. Ankle joint alignment normal. Osseous mineralization normal. No ankle fracture, dislocation, or bone destruction. K-wire at first metatarsal. IMPRESSION: No ankle abnormalities. Electronically Signed   By: Ulyses Southward M.D.   On: 07/29/2022 15:04   DG Tibia/Fibula Left  Result Date: 07/29/2022 CLINICAL DATA:  LEFT knee and ankle pain post fall today EXAM: LEFT TIBIA AND FIBULA - 2 VIEW COMPARISON:  None Available. FINDINGS: Osseous demineralization. Comminuted  depressed fracture of the medial LEFT tibial plateau extending into tibial spines. Dominant articular fracture fragment appears depressed and displaced anteriorly. No additional fracture, dislocation, or bone destruction. Associated knee joint effusion. IMPRESSION: Comminuted displaced intra-articular fracture at LEFT medial tibial plateau with associated joint effusion. Electronically Signed   By: Ulyses Southward M.D.   On: 07/29/2022 15:02    Pending Labs Unresulted Labs (From admission, onward)    None       Vitals/Pain Today's Vitals   07/29/22 1404 07/29/22 1409  BP: (!) 147/83   Pulse: 79   Resp: 16   Temp: 98.7 F (37.1 C)   TempSrc: Oral   SpO2: 96% 97%  Weight:  174 lb (78.9 kg)  Height:  5\' 3"  (1.6 m)  PainSc:  9     Isolation Precautions No active isolations  Medications Medications  fentaNYL (SUBLIMAZE) injection 50 mcg (50 mcg Intravenous Given 07/29/22 1448)  ketorolac (TORADOL) 30 MG/ML injection 30 mg (30 mg Intravenous Given 07/29/22 1634)  fentaNYL (SUBLIMAZE) injection 50 mcg (50 mcg Intravenous Given 07/29/22 1634)  ondansetron (ZOFRAN) injection 4 mg (4 mg Intravenous Given 07/29/22  1634)    Mobility walks     Focused Assessments Cardiac Assessment Handoff:    Lab Results  Component Value Date   CKTOTAL 81 08/12/2008   CKMB 2.6 08/12/2008   TROPONINI 0.02        NO INDICATION OF MYOCARDIAL INJURY. 08/12/2008   Lab Results  Component Value Date   DDIMER 0.47 07/07/2020   Does the Patient currently have chest pain? No   ,    R Recommendations: See Admitting Provider Note  Report given to:   Additional Notes:

## 2022-07-30 ENCOUNTER — Other Ambulatory Visit: Payer: Self-pay

## 2022-07-30 ENCOUNTER — Encounter (HOSPITAL_COMMUNITY): Payer: Self-pay | Admitting: Internal Medicine

## 2022-07-30 ENCOUNTER — Inpatient Hospital Stay (HOSPITAL_COMMUNITY): Payer: Medicare HMO

## 2022-07-30 ENCOUNTER — Inpatient Hospital Stay (HOSPITAL_COMMUNITY): Payer: Medicare HMO | Admitting: Anesthesiology

## 2022-07-30 ENCOUNTER — Encounter (HOSPITAL_COMMUNITY)
Admission: EM | Disposition: A | Discharge status: Skilled Nursing Facility | Payer: Self-pay | Source: Home / Self Care | Attending: Internal Medicine

## 2022-07-30 DIAGNOSIS — G4730 Sleep apnea, unspecified: Secondary | ICD-10-CM

## 2022-07-30 DIAGNOSIS — S83242A Other tear of medial meniscus, current injury, left knee, initial encounter: Secondary | ICD-10-CM

## 2022-07-30 DIAGNOSIS — S82192A Other fracture of upper end of left tibia, initial encounter for closed fracture: Secondary | ICD-10-CM | POA: Diagnosis not present

## 2022-07-30 DIAGNOSIS — S82142A Displaced bicondylar fracture of left tibia, initial encounter for closed fracture: Secondary | ICD-10-CM

## 2022-07-30 DIAGNOSIS — I10 Essential (primary) hypertension: Secondary | ICD-10-CM

## 2022-07-30 HISTORY — PX: ORIF TIBIA PLATEAU: SHX2132

## 2022-07-30 LAB — BASIC METABOLIC PANEL
Anion gap: 9 (ref 5–15)
BUN: 16 mg/dL (ref 8–23)
CO2: 24 mmol/L (ref 22–32)
Calcium: 9 mg/dL (ref 8.9–10.3)
Chloride: 104 mmol/L (ref 98–111)
Creatinine, Ser: 0.82 mg/dL (ref 0.44–1.00)
GFR, Estimated: >60 mL/min (ref >60)
Glucose, Bld: 140 mg/dL — ABNORMAL HIGH (ref 70–99)
Potassium: 4.5 mmol/L (ref 3.5–5.1)
Sodium: 137 mmol/L (ref 135–145)

## 2022-07-30 LAB — CREATININE, SERUM
Creatinine, Ser: 0.9 mg/dL (ref 0.44–1.00)
GFR, Estimated: >60 mL/min (ref >60)

## 2022-07-30 LAB — VITAMIN D 25 HYDROXY (VIT D DEFICIENCY, FRACTURES): Vit D, 25-Hydroxy: 64.32 ng/mL (ref 30–100)

## 2022-07-30 LAB — CBC
HCT: 35.1 % — ABNORMAL LOW (ref 36.0–46.0)
HCT: 33.5 % — ABNORMAL LOW (ref 36.0–46.0)
Hemoglobin: 11.3 g/dL — ABNORMAL LOW (ref 12.0–15.0)
Hemoglobin: 10.9 g/dL — ABNORMAL LOW (ref 12.0–15.0)
MCH: 27 pg (ref 26.0–34.0)
MCH: 26.3 pg (ref 26.0–34.0)
MCHC: 32.2 g/dL (ref 30.0–36.0)
MCHC: 32.5 g/dL (ref 30.0–36.0)
MCV: 83.8 fL (ref 80.0–100.0)
MCV: 80.7 fL (ref 80.0–100.0)
Platelets: 212 10*3/uL (ref 150–400)
Platelets: 219 10*3/uL (ref 150–400)
RBC: 4.19 MIL/uL (ref 3.87–5.11)
RBC: 4.15 MIL/uL (ref 3.87–5.11)
RDW: 14.4 % (ref 11.5–15.5)
RDW: 14.3 % (ref 11.5–15.5)
WBC: 11.1 10*3/uL — ABNORMAL HIGH (ref 4.0–10.5)
WBC: 13.7 10*3/uL — ABNORMAL HIGH (ref 4.0–10.5)
nRBC: 0 % (ref 0.0–0.2)
nRBC: 0 % (ref 0.0–0.2)

## 2022-07-30 LAB — GLUCOSE, CAPILLARY
Glucose-Capillary: 127 mg/dL — ABNORMAL HIGH (ref 70–99)
Glucose-Capillary: 98 mg/dL (ref 70–99)
Glucose-Capillary: 155 mg/dL — ABNORMAL HIGH (ref 70–99)
Glucose-Capillary: 182 mg/dL — ABNORMAL HIGH (ref 70–99)
Glucose-Capillary: 259 mg/dL — ABNORMAL HIGH (ref 70–99)

## 2022-07-30 LAB — SURGICAL PCR SCREEN
MRSA, PCR: POSITIVE — AB
Staphylococcus aureus: POSITIVE — AB

## 2022-07-30 SURGERY — OPEN REDUCTION INTERNAL FIXATION (ORIF) TIBIAL PLATEAU
Anesthesia: General | Laterality: Left

## 2022-07-30 MED ORDER — CEFAZOLIN SODIUM-DEXTROSE 2-4 GM/100ML-% IV SOLN
2.0000 g | Freq: Three times a day (TID) | INTRAVENOUS | Status: AC
  Administered 2022-07-30 – 2022-07-31 (×3): 2 g via INTRAVENOUS
  Filled 2022-07-30 (×3): qty 100

## 2022-07-30 MED ORDER — VANCOMYCIN HCL 1000 MG IV SOLR
INTRAVENOUS | Status: AC
  Filled 2022-07-30: qty 20

## 2022-07-30 MED ORDER — METOCLOPRAMIDE HCL 5 MG/ML IJ SOLN: 5.0000 mg | Freq: Three times a day (TID) | INTRAMUSCULAR | Status: DC | PRN

## 2022-07-30 MED ORDER — LACTATED RINGERS IV SOLN: INTRAVENOUS | Status: DC

## 2022-07-30 MED ORDER — METHOCARBAMOL 500 MG PO TABS
500.0000 mg | ORAL_TABLET | Freq: Four times a day (QID) | ORAL | Status: DC | PRN
  Administered 2022-07-31 – 2022-08-04 (×2): 500 mg via ORAL
  Filled 2022-07-30 (×2): qty 1

## 2022-07-30 MED ORDER — 0.9 % SODIUM CHLORIDE (POUR BTL) OPTIME
TOPICAL | Status: DC | PRN
  Administered 2022-07-30: 1000 mL

## 2022-07-30 MED ORDER — ACETAMINOPHEN 500 MG PO TABS: 1000.0000 mg | ORAL_TABLET | Freq: Once | ORAL | Status: AC

## 2022-07-30 MED ORDER — DOCUSATE SODIUM 100 MG PO CAPS
100.0000 mg | ORAL_CAPSULE | Freq: Two times a day (BID) | ORAL | Status: DC
  Administered 2022-07-30 – 2022-08-04 (×9): 100 mg via ORAL
  Filled 2022-07-30 (×11): qty 1

## 2022-07-30 MED ORDER — TRANEXAMIC ACID-NACL 1000-0.7 MG/100ML-% IV SOLN
INTRAVENOUS | Status: AC
  Filled 2022-07-30: qty 100

## 2022-07-30 MED ORDER — LIDOCAINE 2% (20 MG/ML) 5 ML SYRINGE
INTRAMUSCULAR | Status: DC | PRN
  Administered 2022-07-30: 50 mg via INTRAVENOUS

## 2022-07-30 MED ORDER — POLYETHYLENE GLYCOL 3350 17 G PO PACK
17.0000 g | PACK | Freq: Every day | ORAL | Status: DC | PRN
  Administered 2022-08-03: 17 g via ORAL
  Filled 2022-07-30: qty 1

## 2022-07-30 MED ORDER — CHLORHEXIDINE GLUCONATE 4 % EX SOLN: 60.0000 mL | Freq: Once | CUTANEOUS | Status: DC

## 2022-07-30 MED ORDER — ONDANSETRON HCL 4 MG/2ML IJ SOLN: 4.0000 mg | Freq: Once | INTRAMUSCULAR | Status: DC | PRN

## 2022-07-30 MED ORDER — PROPOFOL 10 MG/ML IV BOLUS
INTRAVENOUS | Status: DC | PRN
  Administered 2022-07-30: 130 mg via INTRAVENOUS

## 2022-07-30 MED ORDER — MUPIROCIN 2 % EX OINT
1.0000 | TOPICAL_OINTMENT | Freq: Two times a day (BID) | CUTANEOUS | 0 refills | Status: AC
Start: 2022-07-30 — End: 2022-08-29

## 2022-07-30 MED ORDER — ACETAMINOPHEN 500 MG PO TABS
ORAL_TABLET | ORAL | Status: AC
  Administered 2022-07-30: 1000 mg via ORAL
  Filled 2022-07-30: qty 2

## 2022-07-30 MED ORDER — OXYCODONE HCL 5 MG/5ML PO SOLN: 5.0000 mg | Freq: Once | ORAL | Status: DC | PRN

## 2022-07-30 MED ORDER — OXYCODONE HCL 5 MG PO TABS: 5.0000 mg | ORAL_TABLET | Freq: Once | ORAL | Status: DC | PRN

## 2022-07-30 MED ORDER — PROPOFOL 10 MG/ML IV BOLUS
INTRAVENOUS | Status: AC
  Filled 2022-07-30: qty 20

## 2022-07-30 MED ORDER — ONDANSETRON HCL 4 MG/2ML IJ SOLN
INTRAMUSCULAR | Status: DC | PRN
  Administered 2022-07-30: 4 mg via INTRAVENOUS

## 2022-07-30 MED ORDER — SODIUM CHLORIDE 0.9 % IV SOLN: INTRAVENOUS | Status: DC

## 2022-07-30 MED ORDER — VANCOMYCIN HCL IN DEXTROSE 1-5 GM/200ML-% IV SOLN
INTRAVENOUS | Status: AC
  Administered 2022-07-30: 1000 mg via INTRAVENOUS
  Filled 2022-07-30: qty 200

## 2022-07-30 MED ORDER — ACETAMINOPHEN 500 MG PO TABS
1000.0000 mg | ORAL_TABLET | Freq: Four times a day (QID) | ORAL | Status: DC
  Administered 2022-07-30 – 2022-08-04 (×18): 1000 mg via ORAL
  Filled 2022-07-30 (×18): qty 2

## 2022-07-30 MED ORDER — MUPIROCIN 2 % EX OINT
1.0000 | TOPICAL_OINTMENT | Freq: Two times a day (BID) | CUTANEOUS | Status: AC
  Administered 2022-07-30 – 2022-08-03 (×10): 1 via NASAL
  Filled 2022-07-30 (×4): qty 22

## 2022-07-30 MED ORDER — METHOCARBAMOL 1000 MG/10ML IJ SOLN: 500.0000 mg | Freq: Four times a day (QID) | INTRAVENOUS | Status: DC | PRN

## 2022-07-30 MED ORDER — HYDROMORPHONE HCL 1 MG/ML IJ SOLN: 0.2500 mg | INTRAMUSCULAR | Status: DC | PRN

## 2022-07-30 MED ORDER — HYDROMORPHONE HCL 1 MG/ML IJ SOLN
0.5000 mg | INTRAMUSCULAR | Status: DC | PRN
  Administered 2022-07-30 – 2022-07-31 (×5): 0.5 mg via INTRAVENOUS
  Filled 2022-07-30 (×5): qty 0.5

## 2022-07-30 MED ORDER — TRANEXAMIC ACID-NACL 1000-0.7 MG/100ML-% IV SOLN
1000.0000 mg | INTRAVENOUS | Status: AC
  Administered 2022-07-30: 1000 mg via INTRAVENOUS

## 2022-07-30 MED ORDER — CHLORHEXIDINE GLUCONATE 0.12 % MT SOLN
OROMUCOSAL | Status: AC
  Administered 2022-07-30: 15 mL via OROMUCOSAL
  Filled 2022-07-30: qty 15

## 2022-07-30 MED ORDER — DEXAMETHASONE SODIUM PHOSPHATE 10 MG/ML IJ SOLN
INTRAMUSCULAR | Status: DC | PRN
  Administered 2022-07-30: 10 mg via INTRAVENOUS

## 2022-07-30 MED ORDER — CHLORHEXIDINE GLUCONATE 4 % EX SOLN
1.0000 | CUTANEOUS | 1 refills | Status: DC
Start: 2022-07-30 — End: 2022-09-03

## 2022-07-30 MED ORDER — POVIDONE-IODINE 10 % EX SWAB: 2.0000 | Freq: Once | CUTANEOUS | Status: DC

## 2022-07-30 MED ORDER — FENTANYL CITRATE (PF) 250 MCG/5ML IJ SOLN
INTRAMUSCULAR | Status: DC | PRN
  Administered 2022-07-30: 100 ug via INTRAVENOUS
  Administered 2022-07-30 (×3): 50 ug via INTRAVENOUS

## 2022-07-30 MED ORDER — VANCOMYCIN HCL 1000 MG IV SOLR
INTRAVENOUS | Status: DC | PRN
  Administered 2022-07-30: 1000 mg via TOPICAL

## 2022-07-30 MED ORDER — CHLORHEXIDINE GLUCONATE 0.12 % MT SOLN: 15.0000 mL | Freq: Once | OROMUCOSAL | Status: AC

## 2022-07-30 MED ORDER — ROCURONIUM BROMIDE 10 MG/ML (PF) SYRINGE
PREFILLED_SYRINGE | INTRAVENOUS | Status: DC | PRN
  Administered 2022-07-30: 50 mg via INTRAVENOUS
  Administered 2022-07-30: 10 mg via INTRAVENOUS

## 2022-07-30 MED ORDER — FENTANYL CITRATE (PF) 250 MCG/5ML IJ SOLN
INTRAMUSCULAR | Status: AC
  Filled 2022-07-30: qty 5

## 2022-07-30 MED ORDER — ENOXAPARIN SODIUM 40 MG/0.4ML IJ SOSY
40.0000 mg | PREFILLED_SYRINGE | INTRAMUSCULAR | Status: DC
  Administered 2022-07-31 – 2022-08-02 (×3): 40 mg via SUBCUTANEOUS
  Filled 2022-07-30 (×3): qty 0.4

## 2022-07-30 MED ORDER — ORAL CARE MOUTH RINSE: 15.0000 mL | Freq: Once | OROMUCOSAL | Status: AC

## 2022-07-30 MED ORDER — SUGAMMADEX SODIUM 200 MG/2ML IV SOLN
INTRAVENOUS | Status: DC | PRN
  Administered 2022-07-30: 200 mg via INTRAVENOUS

## 2022-07-30 MED ORDER — METOCLOPRAMIDE HCL 5 MG PO TABS: 5.0000 mg | ORAL_TABLET | Freq: Three times a day (TID) | ORAL | Status: DC | PRN

## 2022-07-30 MED ORDER — VANCOMYCIN HCL IN DEXTROSE 1-5 GM/200ML-% IV SOLN: 1000.0000 mg | INTRAVENOUS | Status: AC

## 2022-07-30 SURGICAL SUPPLY — 88 items
APL PRP STRL LF DISP 70% ISPRP (MISCELLANEOUS) ×2
APL SKNCLS STERI-STRIP NONHPOA (GAUZE/BANDAGES/DRESSINGS) ×1
BAG COUNTER SPONGE SURGICOUNT (BAG) ×1 IMPLANT
BAG SPNG CNTER NS LX DISP (BAG) ×1
BANDAGE ESMARK 6X9 LF (GAUZE/BANDAGES/DRESSINGS) ×1 IMPLANT
BENZOIN TINCTURE PRP APPL 2/3 (GAUZE/BANDAGES/DRESSINGS) IMPLANT
BIT DRILL 2.5MM SMALL QC EVOS (BIT) IMPLANT
BIT DRILL QC 2.5MM SHRT EVO SM (DRILL) IMPLANT
BLADE CLIPPER SURG (BLADE) IMPLANT
BLADE SURG 15 STRL LF DISP TIS (BLADE) ×1 IMPLANT
BLADE SURG 15 STRL SS (BLADE) ×1
BNDG ADH 5X4 AIR PERM ELC (GAUZE/BANDAGES/DRESSINGS) ×1
BNDG CMPR 9X6 STRL LF SNTH (GAUZE/BANDAGES/DRESSINGS) ×1
BNDG CMPR MED 10X6 ELC LF (GAUZE/BANDAGES/DRESSINGS) ×1
BNDG COHESIVE 4X5 WHT NS (GAUZE/BANDAGES/DRESSINGS) IMPLANT
BNDG ELASTIC 4X5.8 VLCR STR LF (GAUZE/BANDAGES/DRESSINGS) ×1 IMPLANT
BNDG ELASTIC 6X10 VLCR STRL LF (GAUZE/BANDAGES/DRESSINGS) IMPLANT
BNDG ELASTIC 6X5.8 VLCR STR LF (GAUZE/BANDAGES/DRESSINGS) ×1 IMPLANT
BNDG ESMARK 6X9 LF (GAUZE/BANDAGES/DRESSINGS) ×1
BNDG GAUZE DERMACEA FLUFF 4 (GAUZE/BANDAGES/DRESSINGS) ×1 IMPLANT
BNDG GZE DERMACEA 4 6PLY (GAUZE/BANDAGES/DRESSINGS) ×1
BRUSH SCRUB EZ PLAIN DRY (MISCELLANEOUS) ×2 IMPLANT
CANISTER SUCT 3000ML PPV (MISCELLANEOUS) ×1 IMPLANT
CHLORAPREP W/TINT 26 (MISCELLANEOUS) ×2 IMPLANT
CLSR STERI-STRIP ANTIMIC 1/2X4 (GAUZE/BANDAGES/DRESSINGS) IMPLANT
COVER SURGICAL LIGHT HANDLE (MISCELLANEOUS) ×1 IMPLANT
CUFF TOURN SGL QUICK 34 (TOURNIQUET CUFF) ×1
CUFF TRNQT CYL 34X4.125X (TOURNIQUET CUFF) ×1 IMPLANT
DRAPE C-ARM 42X72 X-RAY (DRAPES) ×1 IMPLANT
DRAPE C-ARMOR (DRAPES) ×1 IMPLANT
DRAPE ORTHO SPLIT 77X108 STRL (DRAPES) ×2
DRAPE SURG ORHT 6 SPLT 77X108 (DRAPES) ×2 IMPLANT
DRAPE U-SHAPE 47X51 STRL (DRAPES) ×1 IMPLANT
DRILL 2.5MM SMALL QC EVOS (BIT) ×1
DRILL QC 2.5MM SHORT EVOS SM (DRILL) ×1
ELECT REM PT RETURN 9FT ADLT (ELECTROSURGICAL) ×1
ELECTRODE REM PT RTRN 9FT ADLT (ELECTROSURGICAL) ×1 IMPLANT
GAUZE PAD ABD 7.5X8 STRL (GAUZE/BANDAGES/DRESSINGS) IMPLANT
GAUZE PAD ABD 8X10 STRL (GAUZE/BANDAGES/DRESSINGS) ×2 IMPLANT
GAUZE SPONGE 4X4 12PLY STRL (GAUZE/BANDAGES/DRESSINGS) ×1 IMPLANT
GLOVE BIO SURGEON STRL SZ 6.5 (GLOVE) ×3 IMPLANT
GLOVE BIO SURGEON STRL SZ7.5 (GLOVE) ×4 IMPLANT
GLOVE BIOGEL PI IND STRL 6.5 (GLOVE) ×1 IMPLANT
GLOVE BIOGEL PI IND STRL 7.5 (GLOVE) ×1 IMPLANT
GOWN STRL REUS W/ TWL LRG LVL3 (GOWN DISPOSABLE) ×2 IMPLANT
GOWN STRL REUS W/TWL LRG LVL3 (GOWN DISPOSABLE) ×2
IMMOBILIZER KNEE 22 UNIV (SOFTGOODS) ×1 IMPLANT
K-WIRE 1.6 (WIRE) ×4
K-WIRE FX150X1.6XTROC PNT (WIRE) ×4
KIT BASIN OR (CUSTOM PROCEDURE TRAY) ×1 IMPLANT
KIT TURNOVER KIT B (KITS) ×1 IMPLANT
KWIRE FX150X1.6XTROC PNT (WIRE) IMPLANT
NDL SUT 6 .5 CRC .975X.05 MAYO (NEEDLE) ×1 IMPLANT
NEEDLE MAYO TAPER (NEEDLE) ×1
NS IRRIG 1000ML POUR BTL (IV SOLUTION) ×1 IMPLANT
PACK TOTAL JOINT (CUSTOM PROCEDURE TRAY) ×1 IMPLANT
PAD ARMBOARD 7.5X6 YLW CONV (MISCELLANEOUS) ×2 IMPLANT
PAD CAST 4YDX4 CTTN HI CHSV (CAST SUPPLIES) ×1 IMPLANT
PADDING CAST COTTON 4X4 STRL (CAST SUPPLIES) ×1
PADDING CAST COTTON 6X4 STRL (CAST SUPPLIES) ×1 IMPLANT
PLATE TIB EVOS 8H L 3.5X117 (Plate) IMPLANT
SCREW CORT 3.5X30 ST EVOS (Screw) IMPLANT
SCREW CORT 3.5X44MM ST EVOS (Screw) IMPLANT
SCREW CORTEX 3.5X26 (Screw) IMPLANT
SCREW CTX 3.5X38MM EVOS (Screw) IMPLANT
SCREW LOCK 3.5X50MM EVOS (Screw) IMPLANT
SCREW LOCK 3.5X60MM EVOS (Screw) IMPLANT
SCREW LOCK 3.5X65MM (Screw) IMPLANT
SCREW LOCK 3.5X70 ST EVOS (Screw) IMPLANT
SCREW LOCK ST EVOS 3.5 X 55 (Screw) IMPLANT
STAPLER VISISTAT 35W (STAPLE) ×1 IMPLANT
SUCTION FRAZIER HANDLE 10FR (MISCELLANEOUS) ×1
SUCTION TUBE FRAZIER 10FR DISP (MISCELLANEOUS) ×1 IMPLANT
SUT ETHILON 2 0 FS 18 (SUTURE) ×1 IMPLANT
SUT ETHILON 3 0 PS 1 (SUTURE) IMPLANT
SUT FIBERWIRE #2 38 T-5 BLUE (SUTURE) ×2
SUT VIC AB 0 CT1 27 (SUTURE)
SUT VIC AB 0 CT1 27XBRD ANBCTR (SUTURE) IMPLANT
SUT VIC AB 1 CT1 18XCR BRD 8 (SUTURE) IMPLANT
SUT VIC AB 1 CT1 27 (SUTURE) ×1
SUT VIC AB 1 CT1 27XBRD ANBCTR (SUTURE) ×1 IMPLANT
SUT VIC AB 1 CT1 8-18 (SUTURE)
SUT VIC AB 2-0 CT1 27 (SUTURE) ×2
SUT VIC AB 2-0 CT1 TAPERPNT 27 (SUTURE) ×2 IMPLANT
SUTURE FIBERWR #2 38 T-5 BLUE (SUTURE) IMPLANT
TOWEL GREEN STERILE (TOWEL DISPOSABLE) ×2 IMPLANT
TRAY FOLEY MTR SLVR 16FR STAT (SET/KITS/TRAYS/PACK) IMPLANT
WATER STERILE IRR 1000ML POUR (IV SOLUTION) ×2 IMPLANT

## 2022-07-30 NOTE — Progress Notes (Signed)
PT Cancellation Note  Patient Details Name: Delonna Lessing Torr MRN: [id number] DOB: July 07, 1945   Cancelled Treatment:    Reason Eval/Treat Not Completed: Patient at procedure or test/unavailable - plan for ORIF today, will check back post-op.   Marye Round, PT DPT Acute Rehabilitation Services Secure Chat Preferred  Office [phone]    Truddie Coco 07/30/2022, 9:14 AM

## 2022-07-30 NOTE — H&P (View-Only) (Signed)
Reason for Consult:Left tibia plateau fx Referring Physician: Rinka Pahwani Time called: 0730 Time at bedside: 0854   [name] is an 76 y.o. female.  HPI: [name] was on her bed trying to kill a bug when she lost her balance and fell to the floor. She had immediate left knee pain and could not get up. She was brought to the ED at [facility] where x-rays showed a tibia plateau fx. Orthopedic surgery was consulted and recommended transfer to [facility] for definitive care. She lives with her daughter and husband in a second story apartment and does not any assistive devices to ambulate.  Past Medical History:  Diagnosis Date   Anxiety    Depression    Essential hypertension    Eustachian tube dysfunction    Fibromyalgia    GERD (gastroesophageal reflux disease)    Hyperlipidemia    Hypothyroid    Mild carotid artery disease (HCC)    OSA (obstructive sleep apnea)    Osteopenia    Stroke (HCC)    Type 2 diabetes mellitus (HCC)     Past Surgical History:  Procedure Laterality Date   ANKLE SURGERY     Anteriorvesicourethropexy     BUNIONECTOMY     COLONOSCOPY     NASAL SINUS SURGERY     PATELLA FRACTURE SURGERY  2009   Right   TONSILLECTOMY     TOTAL ABDOMINAL HYSTERECTOMY     WRIST FRACTURE SURGERY  2009    Family History  Problem Relation Age of Onset   Heart attack Mother    Colon cancer Father     Social History:  reports that she has never smoked. She has never used smokeless tobacco. She reports that she does not drink alcohol and does not use drugs.  Allergies:  Allergies  Allergen Reactions   Other Other (See Comments)    NKDA    Medications: I have reviewed the patient's current medications.  Results for orders placed or performed during the hospital encounter of 07/29/22 (from the past 48 hour(s))  Basic metabolic panel     Status: Abnormal   Collection Time: 07/29/22  2:49 PM  Result Value Ref Range   Sodium 135 135 - 145 mmol/L   Potassium 3.7 3.5 - 5.1  mmol/L   Chloride 103 98 - 111 mmol/L   CO2 21 (L) 22 - 32 mmol/L   Glucose, Bld 188 (H) 70 - 99 mg/dL    Comment: Glucose reference range applies only to samples taken after fasting for at least 8 hours.   BUN 17 8 - 23 mg/dL   Creatinine, Ser 0.70 0.44 - 1.00 mg/dL   Calcium 8.9 8.9 - 10.3 mg/dL   GFR, Estimated >60 >60 mL/min    Comment: (NOTE) Calculated using the CKD-EPI Creatinine Equation (2021)    Anion gap 11 5 - 15    Comment: Performed at [facility], [address] [place], [place] [address]  CBC     Status: None   Collection Time: 07/29/22  2:49 PM  Result Value Ref Range   WBC 9.4 4.0 - 10.5 K/uL   RBC 4.62 3.87 - 5.11 MIL/uL   Hemoglobin 12.5 12.0 - 15.0 g/dL   HCT 37.5 36.0 - 46.0 %   MCV 81.2 80.0 - 100.0 fL   MCH 27.1 26.0 - 34.0 pg   MCHC 33.3 30.0 - 36.0 g/dL   RDW 14.3 11.5 - 15.5 %   Platelets 252 150 - 400 K/uL     nRBC 0.0 0.0 - 0.2 %    Comment: Performed at [facility], [address] [place], [place] [address]  Hemoglobin A1c     Status: Abnormal   Collection Time: 07/29/22  2:49 PM  Result Value Ref Range   Hgb A1c MFr Bld 7.2 (H) 4.8 - 5.6 %    Comment: (NOTE) Pre diabetes:          5.7%-6.4%  Diabetes:              >6.4%  Glycemic control for   <7.0% adults with diabetes    Mean Plasma Glucose 159.94 mg/dL    Comment: Performed at [facility], [address] [place], [place] [address]  CBG monitoring, ED     Status: Abnormal   Collection Time: 07/29/22  5:56 PM  Result Value Ref Range   Glucose-Capillary 152 (H) 70 - 99 mg/dL    Comment: Glucose reference range applies only to samples taken after fasting for at least 8 hours.  Glucose, capillary     Status: Abnormal   Collection Time: 07/29/22  9:07 PM  Result Value Ref Range   Glucose-Capillary 126 (H) 70 - 99 mg/dL    Comment: Glucose reference range applies only to samples taken after fasting for at least 8 hours.  Surgical PCR screen     Status: Abnormal   Collection Time:  07/30/22  1:31 AM   Specimen: Nasal Mucosa; Nasal Swab  Result Value Ref Range   MRSA, PCR POSITIVE (A) NEGATIVE    Comment: RESULT CALLED TO, READ BACK BY AND VERIFIED WITH: R ALLEN,RN@0620 07/30/22 MK    Staphylococcus aureus POSITIVE (A) NEGATIVE    Comment: (NOTE) The Xpert SA Assay (FDA approved for NASAL specimens in patients 22 years of age and older), is one component of a comprehensive surveillance program. It is not intended to diagnose infection nor to guide or monitor treatment. Performed at [facility], [address] [place], [place] [address]   Basic metabolic panel     Status: Abnormal   Collection Time: 07/30/22  6:41 AM  Result Value Ref Range   Sodium 137 135 - 145 mmol/L   Potassium 4.5 3.5 - 5.1 mmol/L   Chloride 104 98 - 111 mmol/L   CO2 24 22 - 32 mmol/L   Glucose, Bld 140 (H) 70 - 99 mg/dL    Comment: Glucose reference range applies only to samples taken after fasting for at least 8 hours.   BUN 16 8 - 23 mg/dL   Creatinine, Ser 0.82 0.44 - 1.00 mg/dL   Calcium 9.0 8.9 - 10.3 mg/dL   GFR, Estimated >60 >60 mL/min    Comment: (NOTE) Calculated using the CKD-EPI Creatinine Equation (2021)    Anion gap 9 5 - 15    Comment: Performed at [facility], [address] [place], [place] [address]  CBC     Status: Abnormal   Collection Time: 07/30/22  6:41 AM  Result Value Ref Range   WBC 11.1 (H) 4.0 - 10.5 K/uL   RBC 4.19 3.87 - 5.11 MIL/uL   Hemoglobin 11.3 (L) 12.0 - 15.0 g/dL   HCT 35.1 (L) 36.0 - 46.0 %   MCV 83.8 80.0 - 100.0 fL   MCH 27.0 26.0 - 34.0 pg   MCHC 32.2 30.0 - 36.0 g/dL   RDW 14.4 11.5 - 15.5 %   Platelets 212 150 - 400 K/uL   nRBC 0.0 0.0 -   0.2 %    Comment: Performed at [facility], [address] [place], [place] [address]  Glucose, capillary     Status: Abnormal   Collection Time: 07/30/22  8:13 AM  Result Value Ref Range   Glucose-Capillary 127 (H) 70 - 99 mg/dL    Comment: Glucose reference range  applies only to samples taken after fasting for at least 8 hours.    CT Knee Left Wo Contrast  Result Date: 07/29/2022 CLINICAL DATA:  Knee trauma, tibial plateau fracture. Fell off the bed. The the EXAM: CT OF THE LEFT KNEE WITHOUT CONTRAST TECHNIQUE: Multidetector CT imaging of the left knee was performed according to the standard protocol. Multiplanar CT image reconstructions were also generated. RADIATION DOSE REDUCTION: This exam was performed according to the departmental dose-optimization program which includes automated exposure control, adjustment of the mA and/or kV according to patient size and/or use of iterative reconstruction technique. COMPARISON:  Radiograph performed earlier on the same date FINDINGS: Bones/Joint/Cartilage There is a comminuted fracture of the both medial and lateral tibial plateau predominantly involving the medial tibial plateau. There is a displaced wedge-shaped fracture of the medial malleolus with a proximally 2.5 cm displacement at the articular surface. The large anterior fracture fragment measures a proximally 3.2 x 2.0 cm and is anterosuperiorly displaced. There is also mildly depressed fracture of the posterior aspect of the lateral tibial plateau. Comminuted fracture of the tibial plateau also involves the tibial spine. There is moderate suprapatellar joint effusion with fat fluid levels. The patella, distal femur and fibula appear intact. Ligaments Suboptimally assessed by CT. Muscles and Tendons Muscles are normal in bulk. No intramuscular hematoma or fluid collection. Soft tissues Soft tissue edema about the medial aspect of the proximal tibia and femur. No fluid collection. IMPRESSION: 1. Comminuted fracture of the both medial and lateral tibial plateau predominantly involving the medial tibial plateau (Schatzker type 5), as detailed above. 2. The fracture involves the tibial spine about the insertion of the cruciate ligaments concerning for ligamentous  instability. 3. Moderate suprapatellar joint effusion with fat fluid levels. Electronically Signed   By: Imran  Ahmed D.O.   On: 07/29/2022 16:17   DG Chest Portable 1 View  Result Date: 07/29/2022 CLINICAL DATA:  Fall EXAM: PORTABLE CHEST 1 VIEW COMPARISON:  Chest x-ray dated July 07, 2020 FINDINGS: The heart size and mediastinal contours are within normal limits. Both lungs are clear. The visualized skeletal structures are unremarkable. IMPRESSION: No active disease. Electronically Signed   By: Leah  Strickland M.D.   On: 07/29/2022 15:23   DG Ankle Complete Left  Result Date: 07/29/2022 CLINICAL DATA:  LEFT ankle pain post fall today EXAM: LEFT ANKLE COMPLETE - 3+ VIEW COMPARISON:  None available FINDINGS: See accompanying LEFT tibial/fibular exam for description of medial tibial plateau fracture. Ankle joint alignment normal. Osseous mineralization normal. No ankle fracture, dislocation, or bone destruction. K-wire at first metatarsal. IMPRESSION: No ankle abnormalities. Electronically Signed   By: Mark  Boles M.D.   On: 07/29/2022 15:04   DG Tibia/Fibula Left  Result Date: 07/29/2022 CLINICAL DATA:  LEFT knee and ankle pain post fall today EXAM: LEFT TIBIA AND FIBULA - 2 VIEW COMPARISON:  None Available. FINDINGS: Osseous demineralization. Comminuted depressed fracture of the medial LEFT tibial plateau extending into tibial spines. Dominant articular fracture fragment appears depressed and displaced anteriorly. No additional fracture, dislocation, or bone destruction. Associated knee joint effusion. IMPRESSION: Comminuted displaced intra-articular fracture at LEFT medial tibial plateau with associated joint   effusion. Electronically Signed   By: Mark  Boles M.D.   On: 07/29/2022 15:02    Review of Systems  HENT:  Negative for ear discharge, ear pain, hearing loss and tinnitus.   Eyes:  Negative for photophobia and pain.  Respiratory:  Negative for cough and shortness of breath.   Cardiovascular:   Negative for chest pain.  Gastrointestinal:  Negative for abdominal pain, nausea and vomiting.  Genitourinary:  Negative for dysuria, flank pain, frequency and urgency.  Musculoskeletal:  Positive for arthralgias (Left knee). Negative for back pain, myalgias and neck pain.  Neurological:  Negative for dizziness and headaches.  Hematological:  Does not bruise/bleed easily.  Psychiatric/Behavioral:  The patient is not nervous/anxious.    Blood pressure 132/68, pulse 73, temperature 98.9 F (37.2 C), resp. rate 18, height 5' 3" (1.6 m), weight 78.9 kg, SpO2 97 %. Physical Exam Constitutional:      General: She is not in acute distress.    Appearance: She is well-developed. She is not diaphoretic.  HENT:     Head: Normocephalic and atraumatic.  Eyes:     General: No scleral icterus.       Right eye: No discharge.        Left eye: No discharge.     Conjunctiva/sclera: Conjunctivae normal.  Cardiovascular:     Rate and Rhythm: Normal rate and regular rhythm.  Pulmonary:     Effort: Pulmonary effort is normal. No respiratory distress.  Musculoskeletal:     Cervical back: Normal range of motion.     Comments: LLE No traumatic wounds or rash, knee ecchymotic and swollen. Compartments soft.  Severe TTP  No ankle effusion  Sens DPN, SPN, TN intact  Motor EHL, ext, flex, evers 5/5  DP 1+, PT 2+, No significant edema  Skin:    General: Skin is warm and dry.  Neurological:     Mental Status: She is alert.  Psychiatric:        Mood and Affect: Mood normal.        Behavior: Behavior normal.     Assessment/Plan: Left tibia plateau fx -- Plan ORIF today with Dr. Haddix. Please keep NPO. Multiple medical problems including CVA, hypertension, hyperlipidemia, GERD, and OSA -- per primary service    Christiaan Strebeck J. Milanni Ayub, PA-C Orthopedic Surgery [phone] 07/30/2022, 9:04 AM  

## 2022-07-30 NOTE — Progress Notes (Signed)
OT Cancellation Note  Patient Details Name: [name] MRN: [id number] DOB: 1945/04/27   Cancelled Treatment:    Reason Eval/Treat Not Completed: Patient at procedure or test/ unavailable. Plan for ORIF will check back post-up.   Tyler Deis, OTR/L [name] Acute Rehabilitation Office: [phone]   Myrla Halsted 07/30/2022, 1:00 PM

## 2022-07-30 NOTE — Transfer of Care (Signed)
Immediate Anesthesia Transfer of Care Note  Patient: [name]  Procedure(s) Performed: OPEN REDUCTION INTERNAL FIXATION (ORIF) TIBIAL PLATEAU (Left)  Patient Location: PACU  Anesthesia Type:General  Level of Consciousness: awake and alert   Airway & Oxygen Therapy: Patient Spontanous Breathing and Patient connected to nasal cannula oxygen  Post-op Assessment: Report given to RN and Post -op Vital signs reviewed and stable  Post vital signs: Reviewed and stable  Last Vitals:  Vitals Value Taken Time  BP 140/77 07/30/22 1445  Temp 36.6 C 07/30/22 1445  Pulse 80 07/30/22 1448  Resp 21 07/30/22 1448  SpO2 97 % 07/30/22 1448  Vitals shown include unvalidated device data.  Last Pain:  Vitals:   07/30/22 1158  TempSrc:   PainSc: 8       Patients Stated Pain Goal: 2 (07/30/22 0344)  Complications: No notable events documented.

## 2022-07-30 NOTE — Consult Note (Signed)
Reason for Consult:Left tibia plateau fx Referring Physician: Ardean Larsen Time called: 0730 Time at bedside: 22   [name] is an 77 y.o. female.  HPI: [name] was on her bed trying to kill a bug when she lost her balance and fell to the floor. She had immediate left knee pain and could not get up. She was brought to the ED at [facility] where x-rays showed a tibia plateau fx. Orthopedic surgery was consulted and recommended transfer to [facility] for definitive care. She lives with her daughter and husband in a second story apartment and does not any assistive devices to ambulate.  Past Medical History:  Diagnosis Date   Anxiety    Depression    Essential hypertension    Eustachian tube dysfunction    Fibromyalgia    GERD (gastroesophageal reflux disease)    Hyperlipidemia    Hypothyroid    Mild carotid artery disease (HCC)    OSA (obstructive sleep apnea)    Osteopenia    Stroke (HCC)    Type 2 diabetes mellitus (HCC)     Past Surgical History:  Procedure Laterality Date   ANKLE SURGERY     Anteriorvesicourethropexy     BUNIONECTOMY     COLONOSCOPY     NASAL SINUS SURGERY     PATELLA FRACTURE SURGERY  2009   Right   TONSILLECTOMY     TOTAL ABDOMINAL HYSTERECTOMY     WRIST FRACTURE SURGERY  2009    Family History  Problem Relation Age of Onset   Heart attack Mother    Colon cancer Father     Social History:  reports that she has never smoked. She has never used smokeless tobacco. She reports that she does not drink alcohol and does not use drugs.  Allergies:  Allergies  Allergen Reactions   Other Other (See Comments)    NKDA    Medications: I have reviewed the patient's current medications.  Results for orders placed or performed during the hospital encounter of 07/29/22 (from the past 48 hour(s))  Basic metabolic panel     Status: Abnormal   Collection Time: 07/29/22  2:49 PM  Result Value Ref Range   Sodium 135 135 - 145 mmol/L   Potassium 3.7 3.5 - 5.1  mmol/L   Chloride 103 98 - 111 mmol/L   CO2 21 (L) 22 - 32 mmol/L   Glucose, Bld 188 (H) 70 - 99 mg/dL    Comment: Glucose reference range applies only to samples taken after fasting for at least 8 hours.   BUN 17 8 - 23 mg/dL   Creatinine, Ser 2.95 0.44 - 1.00 mg/dL   Calcium 8.9 8.9 - 62.1 mg/dL   GFR, Estimated >30 >86 mL/min    Comment: (NOTE) Calculated using the CKD-EPI Creatinine Equation (2021)    Anion gap 11 5 - 15    Comment: Performed at [facility], [address], [place], [place] [address]  CBC     Status: None   Collection Time: 07/29/22  2:49 PM  Result Value Ref Range   WBC 9.4 4.0 - 10.5 K/uL   RBC 4.62 3.87 - 5.11 MIL/uL   Hemoglobin 12.5 12.0 - 15.0 g/dL   HCT 96.2 95.2 - 84.1 %   MCV 81.2 80.0 - 100.0 fL   MCH 27.1 26.0 - 34.0 pg   MCHC 33.3 30.0 - 36.0 g/dL   RDW 32.4 40.1 - 02.7 %   Platelets 252 150 - 400 K/uL  nRBC 0.0 0.0 - 0.2 %    Comment: Performed at [facility], [address], [place], [place] [address]  Hemoglobin A1c     Status: Abnormal   Collection Time: 07/29/22  2:49 PM  Result Value Ref Range   Hgb A1c MFr Bld 7.2 (H) 4.8 - 5.6 %    Comment: (NOTE) Pre diabetes:          5.7%-6.4%  Diabetes:              >6.4%  Glycemic control for   <7.0% adults with diabetes    Mean Plasma Glucose 159.94 mg/dL    Comment: Performed at [facility], [address] [place], [place] [address]  CBG monitoring, ED     Status: Abnormal   Collection Time: 07/29/22  5:56 PM  Result Value Ref Range   Glucose-Capillary 152 (H) 70 - 99 mg/dL    Comment: Glucose reference range applies only to samples taken after fasting for at least 8 hours.  Glucose, capillary     Status: Abnormal   Collection Time: 07/29/22  9:07 PM  Result Value Ref Range   Glucose-Capillary 126 (H) 70 - 99 mg/dL    Comment: Glucose reference range applies only to samples taken after fasting for at least 8 hours.  Surgical PCR screen     Status: Abnormal   Collection Time:  07/30/22  1:31 AM   Specimen: Nasal Mucosa; Nasal Swab  Result Value Ref Range   MRSA, PCR POSITIVE (A) NEGATIVE    Comment: RESULT CALLED TO, READ BACK BY AND VERIFIED WITH: R ALLEN,RN@0620  07/30/22 MK    Staphylococcus aureus POSITIVE (A) NEGATIVE    Comment: (NOTE) The Xpert SA Assay (FDA approved for NASAL specimens in patients 12 years of age and older), is one component of a comprehensive surveillance program. It is not intended to diagnose infection nor to guide or monitor treatment. Performed at [facility], [address] [place], [place] [address]   Basic metabolic panel     Status: Abnormal   Collection Time: 07/30/22  6:41 AM  Result Value Ref Range   Sodium 137 135 - 145 mmol/L   Potassium 4.5 3.5 - 5.1 mmol/L   Chloride 104 98 - 111 mmol/L   CO2 24 22 - 32 mmol/L   Glucose, Bld 140 (H) 70 - 99 mg/dL    Comment: Glucose reference range applies only to samples taken after fasting for at least 8 hours.   BUN 16 8 - 23 mg/dL   Creatinine, Ser 9.14 0.44 - 1.00 mg/dL   Calcium 9.0 8.9 - 78.2 mg/dL   GFR, Estimated >95 >62 mL/min    Comment: (NOTE) Calculated using the CKD-EPI Creatinine Equation (2021)    Anion gap 9 5 - 15    Comment: Performed at [facility], [address], [place], [place] [address]  CBC     Status: Abnormal   Collection Time: 07/30/22  6:41 AM  Result Value Ref Range   WBC 11.1 (H) 4.0 - 10.5 K/uL   RBC 4.19 3.87 - 5.11 MIL/uL   Hemoglobin 11.3 (L) 12.0 - 15.0 g/dL   HCT 57.8 (L) 46.9 - 62.9 %   MCV 83.8 80.0 - 100.0 fL   MCH 27.0 26.0 - 34.0 pg   MCHC 32.2 30.0 - 36.0 g/dL   RDW 52.8 41.3 - 24.4 %   Platelets 212 150 - 400 K/uL   nRBC 0.0 0.0 -  0.2 %    Comment: Performed at [facility], [address] [place], [place] [address]  Glucose, capillary     Status: Abnormal   Collection Time: 07/30/22  8:13 AM  Result Value Ref Range   Glucose-Capillary 127 (H) 70 - 99 mg/dL    Comment: Glucose reference range  applies only to samples taken after fasting for at least 8 hours.    CT Knee Left Wo Contrast  Result Date: 07/29/2022 CLINICAL DATA:  Knee trauma, tibial plateau fracture. Fell off the bed. The the EXAM: CT OF THE LEFT KNEE WITHOUT CONTRAST TECHNIQUE: Multidetector CT imaging of the left knee was performed according to the standard protocol. Multiplanar CT image reconstructions were also generated. RADIATION DOSE REDUCTION: This exam was performed according to the departmental dose-optimization program which includes automated exposure control, adjustment of the mA and/or kV according to patient size and/or use of iterative reconstruction technique. COMPARISON:  Radiograph performed earlier on the same date FINDINGS: Bones/Joint/Cartilage There is a comminuted fracture of the both medial and lateral tibial plateau predominantly involving the medial tibial plateau. There is a displaced wedge-shaped fracture of the medial malleolus with a proximally 2.5 cm displacement at the articular surface. The large anterior fracture fragment measures a proximally 3.2 x 2.0 cm and is anterosuperiorly displaced. There is also mildly depressed fracture of the posterior aspect of the lateral tibial plateau. Comminuted fracture of the tibial plateau also involves the tibial spine. There is moderate suprapatellar joint effusion with fat fluid levels. The patella, distal femur and fibula appear intact. Ligaments Suboptimally assessed by CT. Muscles and Tendons Muscles are normal in bulk. No intramuscular hematoma or fluid collection. Soft tissues Soft tissue edema about the medial aspect of the proximal tibia and femur. No fluid collection. IMPRESSION: 1. Comminuted fracture of the both medial and lateral tibial plateau predominantly involving the medial tibial plateau (Schatzker type 5), as detailed above. 2. The fracture involves the tibial spine about the insertion of the cruciate ligaments concerning for ligamentous  instability. 3. Moderate suprapatellar joint effusion with fat fluid levels. Electronically Signed   By: Larose Hires D.O.   On: 07/29/2022 16:17   DG Chest Portable 1 View  Result Date: 07/29/2022 CLINICAL DATA:  Fall EXAM: PORTABLE CHEST 1 VIEW COMPARISON:  Chest x-ray dated July 07, 2020 FINDINGS: The heart size and mediastinal contours are within normal limits. Both lungs are clear. The visualized skeletal structures are unremarkable. IMPRESSION: No active disease. Electronically Signed   By: Allegra Lai M.D.   On: 07/29/2022 15:23   DG Ankle Complete Left  Result Date: 07/29/2022 CLINICAL DATA:  LEFT ankle pain post fall today EXAM: LEFT ANKLE COMPLETE - 3+ VIEW COMPARISON:  None available FINDINGS: See accompanying LEFT tibial/fibular exam for description of medial tibial plateau fracture. Ankle joint alignment normal. Osseous mineralization normal. No ankle fracture, dislocation, or bone destruction. K-wire at first metatarsal. IMPRESSION: No ankle abnormalities. Electronically Signed   By: Ulyses Southward M.D.   On: 07/29/2022 15:04   DG Tibia/Fibula Left  Result Date: 07/29/2022 CLINICAL DATA:  LEFT knee and ankle pain post fall today EXAM: LEFT TIBIA AND FIBULA - 2 VIEW COMPARISON:  None Available. FINDINGS: Osseous demineralization. Comminuted depressed fracture of the medial LEFT tibial plateau extending into tibial spines. Dominant articular fracture fragment appears depressed and displaced anteriorly. No additional fracture, dislocation, or bone destruction. Associated knee joint effusion. IMPRESSION: Comminuted displaced intra-articular fracture at LEFT medial tibial plateau with associated joint  effusion. Electronically Signed   By: Ulyses Southward M.D.   On: 07/29/2022 15:02    Review of Systems  HENT:  Negative for ear discharge, ear pain, hearing loss and tinnitus.   Eyes:  Negative for photophobia and pain.  Respiratory:  Negative for cough and shortness of breath.   Cardiovascular:   Negative for chest pain.  Gastrointestinal:  Negative for abdominal pain, nausea and vomiting.  Genitourinary:  Negative for dysuria, flank pain, frequency and urgency.  Musculoskeletal:  Positive for arthralgias (Left knee). Negative for back pain, myalgias and neck pain.  Neurological:  Negative for dizziness and headaches.  Hematological:  Does not bruise/bleed easily.  Psychiatric/Behavioral:  The patient is not nervous/anxious.    Blood pressure 132/68, pulse 73, temperature 98.9 F (37.2 C), resp. rate 18, height 5\' 3"  (1.6 m), weight 78.9 kg, SpO2 97 %. Physical Exam Constitutional:      General: She is not in acute distress.    Appearance: She is well-developed. She is not diaphoretic.  HENT:     Head: Normocephalic and atraumatic.  Eyes:     General: No scleral icterus.       Right eye: No discharge.        Left eye: No discharge.     Conjunctiva/sclera: Conjunctivae normal.  Cardiovascular:     Rate and Rhythm: Normal rate and regular rhythm.  Pulmonary:     Effort: Pulmonary effort is normal. No respiratory distress.  Musculoskeletal:     Cervical back: Normal range of motion.     Comments: LLE No traumatic wounds or rash, knee ecchymotic and swollen. Compartments soft.  Severe TTP  No ankle effusion  Sens DPN, SPN, TN intact  Motor EHL, ext, flex, evers 5/5  DP 1+, PT 2+, No significant edema  Skin:    General: Skin is warm and dry.  Neurological:     Mental Status: She is alert.  Psychiatric:        Mood and Affect: Mood normal.        Behavior: Behavior normal.     Assessment/Plan: Left tibia plateau fx -- Plan ORIF today with Dr. Jena Gauss. Please keep NPO. Multiple medical problems including CVA, hypertension, hyperlipidemia, GERD, and OSA -- per primary service    Freeman Caldron, PA-C Orthopedic Surgery [phone] 07/30/2022, 9:04 AM

## 2022-07-30 NOTE — Interval H&P Note (Signed)
History and Physical Interval Note:  07/30/2022 11:47 AM  [name]  has presented today for surgery, with the diagnosis of Left tibial plateau fracture.  The various methods of treatment have been discussed with the patient and family. After consideration of risks, benefits and other options for treatment, the patient has consented to  Procedure(s): OPEN REDUCTION INTERNAL FIXATION (ORIF) TIBIAL PLATEAU (Left) as a surgical intervention.  The patient's history has been reviewed, patient examined, no change in status, stable for surgery.  I have reviewed the patient's chart and labs.  Questions were answered to the patient's satisfaction.     Caryn Bee P Ashley Montminy

## 2022-07-30 NOTE — Anesthesia Preprocedure Evaluation (Signed)
Anesthesia Evaluation  Patient identified by MRN, date of birth, ID band Patient awake    Reviewed: Allergy & Precautions, H&P , NPO status , Patient's Chart, lab work & pertinent test results  Airway Mallampati: III  TM Distance: <3 FB Neck ROM: Full    Dental no notable dental hx.    Pulmonary sleep apnea    Pulmonary exam normal breath sounds clear to auscultation       Cardiovascular hypertension, Pt. on medications and Pt. on home beta blockers Normal cardiovascular exam Rhythm:Regular Rate:Normal     Neuro/Psych   Anxiety Depression    CVA    GI/Hepatic negative GI ROS, Neg liver ROS,,,  Endo/Other  diabetesHypothyroidism    Renal/GU negative Renal ROS  negative genitourinary   Musculoskeletal  (+)  Fibromyalgia -  Abdominal   Peds negative pediatric ROS (+)  Hematology negative hematology ROS (+)   Anesthesia Other Findings   Reproductive/Obstetrics negative OB ROS                             Anesthesia Physical Anesthesia Plan  ASA: 3  Anesthesia Plan: General   Post-op Pain Management: Minimal or no pain anticipated and Tylenol PO (pre-op)*   Induction: Intravenous  PONV Risk Score and Plan: 3 and Ondansetron, Dexamethasone and Treatment may vary due to age or medical condition  Airway Management Planned: Oral ETT  Additional Equipment:   Intra-op Plan:   Post-operative Plan: Extubation in OR  Informed Consent: I have reviewed the patients History and Physical, chart, labs and discussed the procedure including the risks, benefits and alternatives for the proposed anesthesia with the patient or authorized representative who has indicated his/her understanding and acceptance.     Dental advisory given  Plan Discussed with: CRNA and Surgeon  Anesthesia Plan Comments:        Anesthesia Quick Evaluation

## 2022-07-30 NOTE — Anesthesia Procedure Notes (Signed)
Procedure Name: Intubation Date/Time: 07/30/2022 1:02 PM  Performed by: Maxine Glenn, CRNAPre-anesthesia Checklist: Patient identified, Emergency Drugs available, Suction available and Patient being monitored Patient Re-evaluated:Patient Re-evaluated prior to induction Oxygen Delivery Method: Circle System Utilized Preoxygenation: Pre-oxygenation with 100% oxygen Induction Type: IV induction Ventilation: Mask ventilation without difficulty Laryngoscope Size: 3 and Mac Grade View: Grade I Tube type: Oral Tube size: 7.0 mm Number of attempts: 1 Airway Equipment and Method: Stylet Placement Confirmation: ETT inserted through vocal cords under direct vision, positive ETCO2 and breath sounds checked- equal and bilateral Secured at: 21 cm Tube secured with: Tape Dental Injury: Teeth and Oropharynx as per pre-operative assessment

## 2022-07-30 NOTE — Op Note (Signed)
Orthopaedic Surgery Operative Note (CSN: [id number] ) Date of Surgery: 07/30/2022  Admit Date: 07/29/2022   Diagnoses: Pre-Op Diagnoses: Left bicondylar tibial plateau fracture  Post-Op Diagnosis: Left bicondylar tibial plateau fracture Left medial meniscus tear  Procedures: CPT 27536-Open reduction internal fixation of left bicondylar tibial plateau fracture CPT 27403-Repair of left medial meniscus   Surgeons : Primary: Bless Lisenby, Gillie Manners, MD  Assistant: Ulyses Southward, PA-C  Location: OR 3   Anesthesia:General   Antibiotics: Vancomycin IV preop with 1 gm vancomycin powder placed topically   Tourniquet time:None used    Estimated Blood Loss: 150 mL  Complications:None  Specimens:None  Implants: Implant Name Type Inv. Item Serial No. Manufacturer Lot No. LRB No. Used Action  PLATE TIB EVOS 8H L 3.5X117 - [id number] Plate PLATE TIB EVOS 8H L 3.5X117  [employer]  Left 1 Implanted  SCREW CORT 3.5X44MM ST EVOS - [id number] Screw SCREW CORT 3.5X44MM ST EVOS  [employer]  Left 1 Implanted  SCREW LOCK 3.5X65MM - [id number] Screw SCREW LOCK 3.5X65MM  [employer]  Left 1 Implanted  SCREW LOCK 3.5X60MM EVOS - [id number] Screw SCREW LOCK 3.5X60MM EVOS  [employer]  Left 1 Implanted  SCREW LOCK 3.5X70 ST EVOS - [id number] Screw SCREW LOCK 3.5X70 ST EVOS  [employer]  Left 1 Implanted  SCREW CORT 3.5X30 ST EVOS - [id number] Screw SCREW CORT 3.5X30 ST EVOS  [employer]  Left 1 Implanted  SCREW CORTEX 3.5X26 - [id number] Screw SCREW CORTEX 3.5X26  [employer]  Left 1 Implanted  SCREW LOCK ST EVOS 3.5 X 55 - [id number] Screw SCREW LOCK ST EVOS 3.5 X 55  [employer]  Left 1 Implanted  SCREW LOCK 3.5X50MM EVOS - [id number] Screw SCREW LOCK 3.5X50MM EVOS  [employer]  Left 1 Implanted     Indications for Surgery: 77 year old female who  sustained a bicondylar tibial plateau fracture due to the unstable nature of her injury I recommend proceeding with open reduction internal fixation.  Risks and benefits were discussed with the patient and her family.  Risks include but not limited to bleeding, infection, malunion, nonunion, posttraumatic arthritis, knee stiffness, even the possibility anesthetic complications.  They agreed to proceed with surgery and consent was obtained.  Operative Findings: 1.  Open reduction internal fixation of left bicondylar tibial plateau fracture using [employer] EVOS medial proximal tibial locking plate 2.  Medial meniscus tear disrupted from the deep MCL treated with repair using #2 FiberWire suture.  Procedure: The patient was identified in the preoperative holding area. Consent was confirmed with the patient and their family and all questions were answered. The operative extremity was marked after confirmation with the patient. she was then brought back to the operating room by our anesthesia colleagues.  She was placed under general anesthetic and carefully transferred over to a radiolucent flattop table.  A bump was placed under her operative hip.  The left lower extremity was then prepped and draped in usual sterile fashion.  A timeout was performed to verify the patient, the procedure, and the extremity.  Preoperative antibiotics were dosed.  Fluoroscopic imaging showed the unstable nature of her injury.  A medial approach to the proximal tibia was then made and carried down through skin and subcutaneous tissue.  Identified the superficial MCL as well as the Pez tendons.  I developed the interval behind the pes tendons to access the  posterior medial fragment.  I also incised through the MCL to visualize the medial split.  Here I encountered the meniscus that was loose from the deep MCL attachment.  Through this window of the MCL I was able to manipulate the anterior and posterior medial fragments to get  it anatomically reduced using both visualization and fluoroscopy.  I used a reduction tenaculum to hold it provisionally and placed anterior to posterior 1.6 mm K wires to reinforce the reduction.  Once I had reduction of the articular surface I then had my assistant provide traction and valgus force to reduce the metaphyseal component out of varus.  Due to the position of the fracture I felt that a direct medial plate would be most appropriate.  I kept the MCL intact and did not loosen this to place the plate.  I placed the plate directly over the MCL and held it provisionally with a K wire and confirmed positioning of the plate.  I then placed a buttress screw at the metaphyseal to bring the plate flush to bone.  I then placed locking screws proximally the anterior screws were into the anterior fragment and the posterior screws were into the posterior medial fragment.  I then percutaneously placed 3.5 millimeter screws in the tibial shaft to complete the construct.  At the end I placed a anterior to posterior 3.5 millimeter screw to hold and reinforce the anterior to posterior joint reduction and remove the K wires.  I then visualized the splint in the MCL and proceeded to close this with a horizontal mattress suture through the meniscus to provide a anchor point for the medial meniscus between the joint.  The incision was then copiously irrigated.  Final fluoroscopic imaging was obtained.  A gram of vancomycin powder was placed into the incision.  A layered closure of 0 Vicryl, 2-0 Vicryl and 3-0 Monocryl with Dermabond was used to close the skin.  Sterile dressing was applied.  The patient was then awoke from anesthesia and taken to the PACU in stable condition.  Post Op Plan/Instructions: The patient will be nonweightbearing to the left lower extremity.  She will receive postoperative Ancef.  She will receive Lovenox for DVT prophylaxis.  We will have her mobilize with physical and Occupational Therapy.   I will recommend a hinged knee brace when she is up with therapy.  She may have unrestricted range of motion of the knee.  I was present and performed the entire surgery.  Ulyses Southward, PA-C did assist me throughout the case. An assistant was necessary given the difficulty in approach, maintenance of reduction and ability to instrument the fracture.   Truitt Merle, MD [facility]

## 2022-07-30 NOTE — Anesthesia Postprocedure Evaluation (Signed)
Anesthesia Post Note  Patient: [employer]  Procedure(s) Performed: OPEN REDUCTION INTERNAL FIXATION (ORIF) TIBIAL PLATEAU (Left)     Patient location during evaluation: PACU Anesthesia Type: General Level of consciousness: awake and alert Pain management: pain level controlled Vital Signs Assessment: post-procedure vital signs reviewed and stable Respiratory status: spontaneous breathing, nonlabored ventilation and respiratory function stable Cardiovascular status: blood pressure returned to baseline and stable Postop Assessment: no apparent nausea or vomiting Anesthetic complications: no  No notable events documented.  Last Vitals:  Vitals:   07/30/22 1533 07/30/22 1546  BP: 129/77 134/68  Pulse: 73 66  Resp: 20 18  Temp: 36.8 C 36.6 C  SpO2: 98% 97%    Last Pain:  Vitals:   07/30/22 1533  TempSrc: Oral  PainSc: 0-No pain      LLE Sensation: Full sensation (07/30/22 1545)          Muath Hallam,W. EDMOND

## 2022-07-30 NOTE — Plan of Care (Signed)

## 2022-07-30 NOTE — Progress Notes (Signed)
Orthopedic Tech Progress Note Patient Details:  [name] 1946/01/05 [id number] Called in order to [facility] for Bledsoe brace Patient ID: [name], female   DOB: 1945-07-10, 77 y.o.   MRN: [id number]  [name] 07/30/2022, 5:44 PM

## 2022-07-30 NOTE — Progress Notes (Signed)
PROGRESS NOTE    [name]  ZOX:[id number] DOB: 1946-01-07 DOA: 07/29/2022 PCP: Geoffry Paradise, MD   Brief Narrative:  [name]  is a 77 y.o. female, with past medical history of CVA, hypertension, hyperlipidemia, GERD, OSA, patient presents to ED secondary to fall, she was brought via EMS, secondary to fall, for left knee and ankle pain, patient reports that she was standing on the bed, trying to go fly on the ceiling, she fell off on the bed, and onto the ground, she denies any head trauma, loss of consciousness, she could not get off the ground secondary to pain, mainly in the left ankle area, where EMS were called.  - in ED workup significant for glucose of 188, imaging significant for proximal tibial fracture, for which orthopedic has been consulted and recommended admission to [facility], n.p.o. after midnight for surgery tomorrow    Assessment & Plan:   Left fibular fracture: -Secondary to mechanical fall, she sustained Comminuted fracture of the both medial and lateral tibial plateau predominantly involving the medial tibial plateau (Schatzker type 5) -Continue with and pain medications, added Dilaudid and Zofran as needed for nausea. -Continue with IV fluids -Consulted Ortho.  Patient is n.p.o. for procedure this a.m.   Hypertension -Blood pressure is stable.  Continue home medication-amlodipine, irbesartan metoprolol.  Continue hydralazine as needed   History of CVA -Continue with aspirin, statin   Diabetes mellitus -Resume Toujeo at a lower dose 60>> 30 units, continue insulin sliding scale, will hold Mounjaro during hospital stay   GERD -Continue with PPI   OSA -Continue with CPAP  Hyperlipidemia: Continue rosuvastatin  Leukocytosis: Likely reactive.  Will continue to monitor.  She is afebrile  DVT prophylaxis: SCD  Code Status: Full code Family Communication: Patient's husband and daughter present at bedside.  Plan of care discussed  with patient in length and she verbalized understanding and agreed with it. Disposition Plan: To be determined  Consultants:  Ortho  Procedures:  None  Antimicrobials:  None  Status is: Inpatient   Subjective: Patient seen and examined.  Lying comfortably on the bed.  Family at the bedside.  Complaining of severe pain in left knee.  Unable to sleep at night.  Morphine did not help at all.  Complaining of nausea.  Objective: Vitals:   07/29/22 2112 07/29/22 2300 07/30/22 0046 07/30/22 0828  BP: (!) 166/83  (!) 144/81 132/68  Pulse: 76 76 73 73  Resp: 16 17 16 18   Temp: 99 F (37.2 C)  98.9 F (37.2 C) 98.9 F (37.2 C)  TempSrc:      SpO2: 96% 97% 97% 97%  Weight:      Height:        Intake/Output Summary (Last 24 hours) at 07/30/2022 1038 Last data filed at 07/29/2022 2100 Gross per 24 hour  Intake --  Output 0 ml  Net 0 ml   Filed Weights   07/29/22 1409  Weight: 78.9 kg    Examination:  General exam: Appears calm and comfortable, on room air, family at the bedside, complaining of pain Respiratory system: Clear to auscultation. Respiratory effort normal. Cardiovascular system: S1 & S2 heard, RRR. No JVD, murmurs, rubs, gallops or clicks. No pedal edema. Gastrointestinal system: Abdomen is nondistended, soft and nontender. No organomegaly or masses felt. Normal bowel sounds heard. Central nervous system: Alert and oriented. No focal neurological deficits. Extremities: Left knee: Bruised, swollen, very tender on palpation, warm to touch Psychiatry: Judgement and insight appear normal.  Mood & affect appropriate.    Data Reviewed: I have personally reviewed following labs and imaging studies  CBC: Recent Labs  Lab 07/29/22 1449 07/30/22 0641  WBC 9.4 11.1*  HGB 12.5 11.3*  HCT 37.5 35.1*  MCV 81.2 83.8  PLT 252 212   Basic Metabolic Panel: Recent Labs  Lab 07/29/22 1449 07/30/22 0641  NA 135 137  K 3.7 4.5  CL 103 104  CO2 21* 24  GLUCOSE 188*  140*  BUN 17 16  CREATININE 0.70 0.82  CALCIUM 8.9 9.0   GFR: Estimated Creatinine Clearance: 59 mL/min (by C-G formula based on SCr of 0.82 mg/dL). Liver Function Tests: No results for input(s): "AST", "ALT", "ALKPHOS", "BILITOT", "PROT", "ALBUMIN" in the last 168 hours. No results for input(s): "LIPASE", "AMYLASE" in the last 168 hours. No results for input(s): "AMMONIA" in the last 168 hours. Coagulation Profile: No results for input(s): "INR", "PROTIME" in the last 168 hours. Cardiac Enzymes: No results for input(s): "CKTOTAL", "CKMB", "CKMBINDEX", "TROPONINI" in the last 168 hours. BNP (last 3 results) No results for input(s): "PROBNP" in the last 8760 hours. HbA1C: Recent Labs    07/29/22 1449  HGBA1C 7.2*   CBG: Recent Labs  Lab 07/29/22 1756 07/29/22 2107 07/30/22 0813  GLUCAP 152* 126* 127*   Lipid Profile: No results for input(s): "CHOL", "HDL", "LDLCALC", "TRIG", "CHOLHDL", "LDLDIRECT" in the last 72 hours. Thyroid Function Tests: No results for input(s): "TSH", "T4TOTAL", "FREET4", "T3FREE", "THYROIDAB" in the last 72 hours. Anemia Panel: No results for input(s): "VITAMINB12", "FOLATE", "FERRITIN", "TIBC", "IRON", "RETICCTPCT" in the last 72 hours. Sepsis Labs: No results for input(s): "PROCALCITON", "LATICACIDVEN" in the last 168 hours.  Recent Results (from the past 240 hour(s))  Surgical PCR screen     Status: Abnormal   Collection Time: 07/30/22  1:31 AM   Specimen: Nasal Mucosa; Nasal Swab  Result Value Ref Range Status   MRSA, PCR POSITIVE (A) NEGATIVE Final    Comment: RESULT CALLED TO, READ BACK BY AND VERIFIED WITH: R ALLEN,RN@0620  07/30/22 MK    Staphylococcus aureus POSITIVE (A) NEGATIVE Final    Comment: (NOTE) The Xpert SA Assay (FDA approved for NASAL specimens in patients 52 years of age and older), is one component of a comprehensive surveillance program. It is not intended to diagnose infection nor to guide or monitor  treatment. Performed at [facility], [address], [place], [place] [address]       Radiology Studies: CT Knee Left Wo Contrast  Result Date: 07/29/2022 CLINICAL DATA:  Knee trauma, tibial plateau fracture. Fell off the bed. The the EXAM: CT OF THE LEFT KNEE WITHOUT CONTRAST TECHNIQUE: Multidetector CT imaging of the left knee was performed according to the standard protocol. Multiplanar CT image reconstructions were also generated. RADIATION DOSE REDUCTION: This exam was performed according to the departmental dose-optimization program which includes automated exposure control, adjustment of the mA and/or kV according to patient size and/or use of iterative reconstruction technique. COMPARISON:  Radiograph performed earlier on the same date FINDINGS: Bones/Joint/Cartilage There is a comminuted fracture of the both medial and lateral tibial plateau predominantly involving the medial tibial plateau. There is a displaced wedge-shaped fracture of the medial malleolus with a proximally 2.5 cm displacement at the articular surface. The large anterior fracture fragment measures a proximally 3.2 x 2.0 cm and is anterosuperiorly displaced. There is also mildly depressed fracture of the posterior aspect of the lateral tibial plateau. Comminuted fracture of the tibial plateau also  involves the tibial spine. There is moderate suprapatellar joint effusion with fat fluid levels. The patella, distal femur and fibula appear intact. Ligaments Suboptimally assessed by CT. Muscles and Tendons Muscles are normal in bulk. No intramuscular hematoma or fluid collection. Soft tissues Soft tissue edema about the medial aspect of the proximal tibia and femur. No fluid collection. IMPRESSION: 1. Comminuted fracture of the both medial and lateral tibial plateau predominantly involving the medial tibial plateau (Schatzker type 5), as detailed above. 2. The fracture involves the tibial spine about the insertion of the cruciate  ligaments concerning for ligamentous instability. 3. Moderate suprapatellar joint effusion with fat fluid levels. Electronically Signed   By: Larose Hires D.O.   On: 07/29/2022 16:17   DG Chest Portable 1 View  Result Date: 07/29/2022 CLINICAL DATA:  Fall EXAM: PORTABLE CHEST 1 VIEW COMPARISON:  Chest x-ray dated July 07, 2020 FINDINGS: The heart size and mediastinal contours are within normal limits. Both lungs are clear. The visualized skeletal structures are unremarkable. IMPRESSION: No active disease. Electronically Signed   By: Allegra Lai M.D.   On: 07/29/2022 15:23   DG Ankle Complete Left  Result Date: 07/29/2022 CLINICAL DATA:  LEFT ankle pain post fall today EXAM: LEFT ANKLE COMPLETE - 3+ VIEW COMPARISON:  None available FINDINGS: See accompanying LEFT tibial/fibular exam for description of medial tibial plateau fracture. Ankle joint alignment normal. Osseous mineralization normal. No ankle fracture, dislocation, or bone destruction. K-wire at first metatarsal. IMPRESSION: No ankle abnormalities. Electronically Signed   By: Ulyses Southward M.D.   On: 07/29/2022 15:04   DG Tibia/Fibula Left  Result Date: 07/29/2022 CLINICAL DATA:  LEFT knee and ankle pain post fall today EXAM: LEFT TIBIA AND FIBULA - 2 VIEW COMPARISON:  None Available. FINDINGS: Osseous demineralization. Comminuted depressed fracture of the medial LEFT tibial plateau extending into tibial spines. Dominant articular fracture fragment appears depressed and displaced anteriorly. No additional fracture, dislocation, or bone destruction. Associated knee joint effusion. IMPRESSION: Comminuted displaced intra-articular fracture at LEFT medial tibial plateau with associated joint effusion. Electronically Signed   By: Ulyses Southward M.D.   On: 07/29/2022 15:02    Scheduled Meds:  amLODipine  5 mg Oral Daily   aspirin  81 mg Oral Daily   docusate sodium  100 mg Oral BID   DULoxetine  30 mg Oral Daily   insulin aspart  0-15 Units  Subcutaneous TID WC   insulin aspart  0-5 Units Subcutaneous QHS   insulin glargine-yfgn  30 Units Subcutaneous Daily   irbesartan  300 mg Oral Daily   metoprolol succinate  50 mg Oral Daily   mupirocin ointment  1 Application Nasal BID   pantoprazole  40 mg Oral Daily   rosuvastatin  20 mg Oral QHS   Continuous Infusions:   LOS: 1 day   Time spent: 35 minutes   Elliona Doddridge Estill Cotta, MD [facility]  If 7PM-7AM, please contact night-coverage [email or web address] 07/30/2022, 10:38 AM

## 2022-07-31 ENCOUNTER — Encounter (HOSPITAL_COMMUNITY): Payer: Self-pay | Admitting: Student

## 2022-07-31 DIAGNOSIS — S82192A Other fracture of upper end of left tibia, initial encounter for closed fracture: Secondary | ICD-10-CM | POA: Diagnosis not present

## 2022-07-31 LAB — BASIC METABOLIC PANEL
Anion gap: 10 (ref 5–15)
BUN: 14 mg/dL (ref 8–23)
CO2: 24 mmol/L (ref 22–32)
Calcium: 9 mg/dL (ref 8.9–10.3)
Chloride: 100 mmol/L (ref 98–111)
Creatinine, Ser: 0.88 mg/dL (ref 0.44–1.00)
GFR, Estimated: >60 mL/min (ref >60)
Glucose, Bld: 242 mg/dL — ABNORMAL HIGH (ref 70–99)
Potassium: 5.2 mmol/L — ABNORMAL HIGH (ref 3.5–5.1)
Sodium: 134 mmol/L — ABNORMAL LOW (ref 135–145)

## 2022-07-31 LAB — CBC
HCT: 31 % — ABNORMAL LOW (ref 36.0–46.0)
Hemoglobin: 10.3 g/dL — ABNORMAL LOW (ref 12.0–15.0)
MCH: 26.7 pg (ref 26.0–34.0)
MCHC: 33.2 g/dL (ref 30.0–36.0)
MCV: 80.3 fL (ref 80.0–100.0)
Platelets: 209 10*3/uL (ref 150–400)
RBC: 3.86 MIL/uL — ABNORMAL LOW (ref 3.87–5.11)
RDW: 14.3 % (ref 11.5–15.5)
WBC: 11 10*3/uL — ABNORMAL HIGH (ref 4.0–10.5)
nRBC: 0 % (ref 0.0–0.2)

## 2022-07-31 LAB — GLUCOSE, CAPILLARY
Glucose-Capillary: 179 mg/dL — ABNORMAL HIGH (ref 70–99)
Glucose-Capillary: 200 mg/dL — ABNORMAL HIGH (ref 70–99)
Glucose-Capillary: 119 mg/dL — ABNORMAL HIGH (ref 70–99)
Glucose-Capillary: 175 mg/dL — ABNORMAL HIGH (ref 70–99)

## 2022-07-31 LAB — MAGNESIUM: Magnesium: 1.9 mg/dL (ref 1.7–2.4)

## 2022-07-31 MED ORDER — SODIUM ZIRCONIUM CYCLOSILICATE 10 G PO PACK
10.0000 g | PACK | Freq: Once | ORAL | Status: AC
  Administered 2022-07-31: 10 g via ORAL
  Filled 2022-07-31: qty 1

## 2022-07-31 NOTE — Progress Notes (Signed)
Inpatient Rehab Admissions Coordinator:    CIR consult received. Upon case review, pt. Does not appear to demonstrate medical necessity for CIR admit and I do not think payor would approve CIR for this diagnosis. Recommend TOC look for other rehab venues.  CIR will sign off.   Megan Salon, MS, CCC-SLP Rehab Admissions Coordinator  [phone] (celll) [phone] (office)

## 2022-07-31 NOTE — Progress Notes (Signed)
Orthopaedic Trauma Progress Note  SUBJECTIVE: Patient doing fairly well this morning.  Pain decently well-controlled.  Does note some occasional sharp, shooting pains over the lateral aspect of the left knee.  Dilaudid and oxycodone seem to be helping.  Was able to get out of bed and mobilize to bedside commode earlier this morning with nursing.  No chest pain. No SOB. No nausea/vomiting. No other complaints.  Potassium noted to be 5.2 this morning.  OBJECTIVE:  Vitals:   07/31/22 0014 07/31/22 0446  BP: 113/60 (!) 116/56  Pulse: 81 80  Resp: 19 18  Temp: 97.7 F (36.5 C) 98.3 F (36.8 C)  SpO2: 95% 97%    General: Sitting up in bed comfortably, no acute distress.  Pleasant and cooperative Respiratory: No increased work of breathing.  Left lower extremity: Dressings clean, dry, intact.  Tenderness over the proximal tibia as expected.  Ankle dorsiflexion/plantarflexion intact.  Endorses sensation to light touch over all aspects of the foot. + DP pulse  IMAGING: Stable post op imaging.   LABS:  Results for orders placed or performed during the hospital encounter of 07/29/22 (from the past 24 hour(s))  Glucose, capillary     Status: Abnormal   Collection Time: 07/30/22  8:13 AM  Result Value Ref Range   Glucose-Capillary 127 (H) 70 - 99 mg/dL  Glucose, capillary     Status: None   Collection Time: 07/30/22 11:18 AM  Result Value Ref Range   Glucose-Capillary 98 70 - 99 mg/dL  Glucose, capillary     Status: Abnormal   Collection Time: 07/30/22  2:56 PM  Result Value Ref Range   Glucose-Capillary 155 (H) 70 - 99 mg/dL  VITAMIN D 25 Hydroxy (Vit-D Deficiency, Fractures)     Status: None   Collection Time: 07/30/22  4:23 PM  Result Value Ref Range   Vit D, 25-Hydroxy 64.32 30 - 100 ng/mL  CBC     Status: Abnormal   Collection Time: 07/30/22  4:23 PM  Result Value Ref Range   WBC 13.7 (H) 4.0 - 10.5 K/uL   RBC 4.15 3.87 - 5.11 MIL/uL   Hemoglobin 10.9 (L) 12.0 - 15.0 g/dL   HCT  16.1 (L) 09.6 - 46.0 %   MCV 80.7 80.0 - 100.0 fL   MCH 26.3 26.0 - 34.0 pg   MCHC 32.5 30.0 - 36.0 g/dL   RDW 04.5 40.9 - 81.1 %   Platelets 219 150 - 400 K/uL   nRBC 0.0 0.0 - 0.2 %  Creatinine, serum     Status: None   Collection Time: 07/30/22  4:23 PM  Result Value Ref Range   Creatinine, Ser 0.90 0.44 - 1.00 mg/dL   GFR, Estimated >91 >47 mL/min  Glucose, capillary     Status: Abnormal   Collection Time: 07/30/22  5:03 PM  Result Value Ref Range   Glucose-Capillary 182 (H) 70 - 99 mg/dL  Glucose, capillary     Status: Abnormal   Collection Time: 07/30/22 10:08 PM  Result Value Ref Range   Glucose-Capillary 259 (H) 70 - 99 mg/dL  CBC     Status: Abnormal   Collection Time: 07/31/22  4:13 AM  Result Value Ref Range   WBC 11.0 (H) 4.0 - 10.5 K/uL   RBC 3.86 (L) 3.87 - 5.11 MIL/uL   Hemoglobin 10.3 (L) 12.0 - 15.0 g/dL   HCT 82.9 (L) 56.2 - 13.0 %   MCV 80.3 80.0 - 100.0 fL   MCH 26.7 26.0 -  34.0 pg   MCHC 33.2 30.0 - 36.0 g/dL   RDW 16.1 09.6 - 04.5 %   Platelets 209 150 - 400 K/uL   nRBC 0.0 0.0 - 0.2 %  Basic metabolic panel     Status: Abnormal   Collection Time: 07/31/22  4:13 AM  Result Value Ref Range   Sodium 134 (L) 135 - 145 mmol/L   Potassium 5.2 (H) 3.5 - 5.1 mmol/L   Chloride 100 98 - 111 mmol/L   CO2 24 22 - 32 mmol/L   Glucose, Bld 242 (H) 70 - 99 mg/dL   BUN 14 8 - 23 mg/dL   Creatinine, Ser 4.09 0.44 - 1.00 mg/dL   Calcium 9.0 8.9 - 81.1 mg/dL   GFR, Estimated >91 >47 mL/min   Anion gap 10 5 - 15    ASSESSMENT: [name] is a 77 y.o. female, 1 Day Post-Op s/p OPEN REDUCTION INTERNAL FIXATION LEFT TIBIAL PLATEAU  CV/Blood loss: Acute blood loss anemia, Hgb 10.3 this morning. Hemodynamically stable  PLAN: Weightbearing: NWB LLE ROM:  Ok for ROM as tolerated  Incisional and dressing care: Reinforce dressings as needed  Showering: Okay to begin getting incisions wet 08/02/2022 Orthopedic device(s):  Bledsoe brace when OOB Pain  management:  1. Tylenol 1000 mg q 6 hours scheduled 2. Robaxin 500 mg q 6 hours PRN 3. Oxycodone 5 mg q 4 hours PRN 4. Dilaudid 0.5 mg q 4 hours PRN VTE prophylaxis: Lovenox, SCDs ID:  Ancef 2gm post op Foley/Lines:  No foley, KVO IVFs Impediments to Fracture Healing: Vitamin D level 64, no additional supplementation needed Dispo: PT/OT evaluation today, dispo pending.  Okay for discharge from ortho standpoint once cleared by medicine team and therapies  D/C recommendations: -Oxycodone and Robaxin for pain control -Aspirin 325 mg daily for DVT prophylaxis -No additional need for Vit D supplementation  Follow - up plan: 2 weeks after discharge for wound check and repeat x-rays   Contact information:  Truitt Merle MD, Thyra Breed PA-C. After hours and holidays please check [email or web address] for group call information for [facility]   Thompson Caul, PA-C [phone] (office) [email or web address]

## 2022-07-31 NOTE — TOC CAGE-AID Note (Signed)
Transition of Care [facility]) - CAGE-AID Screening   Patient Details  Name: [name] MRN: [id number] Date of Birth: 1945-06-12   Judie Bonus, RN Phone Number: 07/31/2022, 6:39 AM   Clinical Narrative:  Pt denies tobacco, etoh, drug use. No resources needed  CAGE-AID Screening:    Have You Ever Felt You Ought to Cut Down on Your Drinking or Drug Use?: No Have People Annoyed You By Critizing Your Drinking Or Drug Use?: No Have You Felt Bad Or Guilty About Your Drinking Or Drug Use?: No Have You Ever Had a Drink or Used Drugs First Thing In The Morning to Steady Your Nerves or to Get Rid of a Hangover?: No CAGE-AID Score: 0

## 2022-07-31 NOTE — Progress Notes (Signed)
PROGRESS NOTE    [name]  WUJ:[id number] DOB: 1946-03-21 DOA: 07/29/2022 PCP: Geoffry Paradise, MD   Brief Narrative:  [name]  is a 77 y.o. female, with past medical history of CVA, hypertension, hyperlipidemia, GERD, OSA, patient presents to ED secondary to fall, she was brought via EMS, secondary to fall, for left knee and ankle pain, patient reports that she was standing on the bed, trying to go fly on the ceiling, she fell off on the bed, and onto the ground, she denies any head trauma, loss of consciousness, she could not get off the ground secondary to pain, mainly in the left ankle area, where EMS were called.  - in ED workup significant for glucose of 188, imaging significant for proximal tibial fracture, for which orthopedic has been consulted and recommended admission to [facility], n.p.o. after midnight for surgery tomorrow    Assessment & Plan:   Left fibular fracture: -Secondary to mechanical fall, she sustained Comminuted fracture of the both medial and lateral tibial plateau predominantly involving the medial tibial plateau (Schatzker type 5) -Status post open reduction internal fixation of left bicondylar tibial plateau fracture on 07/30/2022. -Continue as needed pain medications.  Aspirin 325 mg daily for DVT prophylaxis at discharge. -Follow-up with Ortho in 2 weeks after the discharge for wound check and repeat x-rays. -PT evaluation recommended CIR.   Hypertension -Blood pressure is stable.  Continue home medication-amlodipine, irbesartan metoprolol.  Continue hydralazine as needed   History of CVA -Continue with aspirin, statin   Diabetes mellitus -Resume Toujeo at a lower dose 60>> 30 units, continue insulin sliding scale, will hold Mounjaro during hospital stay   GERD -Continue with PPI   OSA -Continue with CPAP  Hyperlipidemia: Continue rosuvastatin  Normocytic anemia: H&H trended down from 12 to 10.  Will continue to monitor  and transfuse as needed.  Hyperkalemia: Potassium 5.2.  Lokelma given. repeat BMP tomorrow a.m.  Leukocytosis: Improving.  Likely reactive.  Will continue to monitor.  She is afebrile  DVT prophylaxis: SCD  Code Status: Full code Family Communication: Patient's husband and daughter present at bedside.  Plan of care discussed with patient in length and she verbalized understanding and agreed with it. Disposition Plan: To be determined  Consultants:  Ortho  Procedures:  None  Antimicrobials:  None  Status is: Inpatient   Subjective: Patient seen and examined.  Family at the bedside.  Eating breakfast.  Reports some improvement in pain after the surgery.  Remained afebrile.  No acute event overnight.  Objective: Vitals:   07/30/22 2016 07/31/22 0014 07/31/22 0446 07/31/22 0950  BP: (!) 153/73 113/60 (!) 116/56 126/61  Pulse: 72 81 80 80  Resp: 18 19 18 20   Temp: 98.1 F (36.7 C) 97.7 F (36.5 C) 98.3 F (36.8 C) 98.5 F (36.9 C)  TempSrc: Oral  Oral Oral  SpO2: 100% 95% 97% 97%  Weight:      Height:        Intake/Output Summary (Last 24 hours) at 07/31/2022 1257 Last data filed at 07/31/2022 1239 Gross per 24 hour  Intake 1431.45 ml  Output 150 ml  Net 1281.45 ml    Filed Weights   07/29/22 1409 07/30/22 1138  Weight: 78.9 kg 78.9 kg    Examination:  General exam: Appears calm and comfortable, on room air, family at the bedside Respiratory system: Clear to auscultation. Respiratory effort normal. Cardiovascular system: S1 & S2 heard, RRR. No JVD, murmurs, rubs, gallops or clicks.  No pedal edema. Gastrointestinal system: Abdomen is nondistended, soft and nontender. No organomegaly or masses felt. Normal bowel sounds heard. Central nervous system: Alert and oriented. No focal neurological deficits. Extremities: Left leg: Dressing dry and intact.  Able to wiggle her toes Psychiatry: Judgement and insight appear normal. Mood & affect appropriate.    Data  Reviewed: I have personally reviewed following labs and imaging studies  CBC: Recent Labs  Lab 07/29/22 1449 07/30/22 0641 07/30/22 1623 07/31/22 0413  WBC 9.4 11.1* 13.7* 11.0*  HGB 12.5 11.3* 10.9* 10.3*  HCT 37.5 35.1* 33.5* 31.0*  MCV 81.2 83.8 80.7 80.3  PLT 252 212 219 209    Basic Metabolic Panel: Recent Labs  Lab 07/29/22 1449 07/30/22 0641 07/30/22 1623 07/31/22 0407 07/31/22 0413  NA 135 137  --   --  134*  K 3.7 4.5  --   --  5.2*  CL 103 104  --   --  100  CO2 21* 24  --   --  24  GLUCOSE 188* 140*  --   --  242*  BUN 17 16  --   --  14  CREATININE 0.70 0.82 0.90  --  0.88  CALCIUM 8.9 9.0  --   --  9.0  MG  --   --   --  1.9  --     GFR: Estimated Creatinine Clearance: 54.9 mL/min (by C-G formula based on SCr of 0.88 mg/dL). Liver Function Tests: No results for input(s): "AST", "ALT", "ALKPHOS", "BILITOT", "PROT", "ALBUMIN" in the last 168 hours. No results for input(s): "LIPASE", "AMYLASE" in the last 168 hours. No results for input(s): "AMMONIA" in the last 168 hours. Coagulation Profile: No results for input(s): "INR", "PROTIME" in the last 168 hours. Cardiac Enzymes: No results for input(s): "CKTOTAL", "CKMB", "CKMBINDEX", "TROPONINI" in the last 168 hours. BNP (last 3 results) No results for input(s): "PROBNP" in the last 8760 hours. HbA1C: Recent Labs    07/29/22 1449  HGBA1C 7.2*    CBG: Recent Labs  Lab 07/30/22 1456 07/30/22 1703 07/30/22 2208 07/31/22 0938 07/31/22 1242  GLUCAP 155* 182* 259* 179* 200*    Lipid Profile: No results for input(s): "CHOL", "HDL", "LDLCALC", "TRIG", "CHOLHDL", "LDLDIRECT" in the last 72 hours. Thyroid Function Tests: No results for input(s): "TSH", "T4TOTAL", "FREET4", "T3FREE", "THYROIDAB" in the last 72 hours. Anemia Panel: No results for input(s): "VITAMINB12", "FOLATE", "FERRITIN", "TIBC", "IRON", "RETICCTPCT" in the last 72 hours. Sepsis Labs: No results for input(s): "PROCALCITON",  "LATICACIDVEN" in the last 168 hours.  Recent Results (from the past 240 hour(s))  Surgical PCR screen     Status: Abnormal   Collection Time: 07/30/22  1:31 AM   Specimen: Nasal Mucosa; Nasal Swab  Result Value Ref Range Status   MRSA, PCR POSITIVE (A) NEGATIVE Final    Comment: RESULT CALLED TO, READ BACK BY AND VERIFIED WITH: R ALLEN,RN@0620  07/30/22 MK    Staphylococcus aureus POSITIVE (A) NEGATIVE Final    Comment: (NOTE) The Xpert SA Assay (FDA approved for NASAL specimens in patients 61 years of age and older), is one component of a comprehensive surveillance program. It is not intended to diagnose infection nor to guide or monitor treatment. Performed at [facility], [address], [place], [place] [address]       Radiology Studies: DG Knee Left Port  Result Date: 07/30/2022 CLINICAL DATA:  Fracture, postop. EXAM: PORTABLE LEFT KNEE - 1-2 VIEW COMPARISON:  Preoperative imaging. FINDINGS: Lateral plate  and multi screw fixation of comminuted proximal tibial fracture. Improved fracture alignment from preoperative imaging. Recent postsurgical change includes air and edema in the soft tissues and joint space. IMPRESSION: ORIF of comminuted proximal tibial fracture, in improved alignment from preoperative imaging. Electronically Signed   By: Narda Rutherford M.D.   On: 07/30/2022 16:49   DG Tibia/Fibula Left  Result Date: 07/30/2022 CLINICAL DATA:  [id number] Surgery, elective [id number] EXAM: LEFT TIBIA AND FIBULA - 2 VIEW COMPARISON:  CT 07/29/2022 FINDINGS: Intraoperative images during tibial plateau fracture ORIF with medial plate fixation. Improved fracture alignment. Hardware is intact without evidence of loosening. IMPRESSION: Intraoperative images during tibial plateau fracture ORIF. Improved fracture alignment. No evidence of immediate hardware complication. Electronically Signed   By: Caprice Renshaw M.D.   On: 07/30/2022 14:56   DG C-Arm 1-60 Min-No Report  Result Date:  07/30/2022 Fluoroscopy was utilized by the requesting physician.  No radiographic interpretation.   DG C-Arm 1-60 Min-No Report  Result Date: 07/30/2022 Fluoroscopy was utilized by the requesting physician.  No radiographic interpretation.   CT Knee Left Wo Contrast  Result Date: 07/29/2022 CLINICAL DATA:  Knee trauma, tibial plateau fracture. Fell off the bed. The the EXAM: CT OF THE LEFT KNEE WITHOUT CONTRAST TECHNIQUE: Multidetector CT imaging of the left knee was performed according to the standard protocol. Multiplanar CT image reconstructions were also generated. RADIATION DOSE REDUCTION: This exam was performed according to the departmental dose-optimization program which includes automated exposure control, adjustment of the mA and/or kV according to patient size and/or use of iterative reconstruction technique. COMPARISON:  Radiograph performed earlier on the same date FINDINGS: Bones/Joint/Cartilage There is a comminuted fracture of the both medial and lateral tibial plateau predominantly involving the medial tibial plateau. There is a displaced wedge-shaped fracture of the medial malleolus with a proximally 2.5 cm displacement at the articular surface. The large anterior fracture fragment measures a proximally 3.2 x 2.0 cm and is anterosuperiorly displaced. There is also mildly depressed fracture of the posterior aspect of the lateral tibial plateau. Comminuted fracture of the tibial plateau also involves the tibial spine. There is moderate suprapatellar joint effusion with fat fluid levels. The patella, distal femur and fibula appear intact. Ligaments Suboptimally assessed by CT. Muscles and Tendons Muscles are normal in bulk. No intramuscular hematoma or fluid collection. Soft tissues Soft tissue edema about the medial aspect of the proximal tibia and femur. No fluid collection. IMPRESSION: 1. Comminuted fracture of the both medial and lateral tibial plateau predominantly involving the medial  tibial plateau (Schatzker type 5), as detailed above. 2. The fracture involves the tibial spine about the insertion of the cruciate ligaments concerning for ligamentous instability. 3. Moderate suprapatellar joint effusion with fat fluid levels. Electronically Signed   By: Larose Hires D.O.   On: 07/29/2022 16:17   DG Chest Portable 1 View  Result Date: 07/29/2022 CLINICAL DATA:  Fall EXAM: PORTABLE CHEST 1 VIEW COMPARISON:  Chest x-ray dated July 07, 2020 FINDINGS: The heart size and mediastinal contours are within normal limits. Both lungs are clear. The visualized skeletal structures are unremarkable. IMPRESSION: No active disease. Electronically Signed   By: Allegra Lai M.D.   On: 07/29/2022 15:23   DG Ankle Complete Left  Result Date: 07/29/2022 CLINICAL DATA:  LEFT ankle pain post fall today EXAM: LEFT ANKLE COMPLETE - 3+ VIEW COMPARISON:  None available FINDINGS: See accompanying LEFT tibial/fibular exam for description of medial tibial plateau fracture. Ankle joint alignment normal. Osseous mineralization  normal. No ankle fracture, dislocation, or bone destruction. K-wire at first metatarsal. IMPRESSION: No ankle abnormalities. Electronically Signed   By: Ulyses Southward M.D.   On: 07/29/2022 15:04   DG Tibia/Fibula Left  Result Date: 07/29/2022 CLINICAL DATA:  LEFT knee and ankle pain post fall today EXAM: LEFT TIBIA AND FIBULA - 2 VIEW COMPARISON:  None Available. FINDINGS: Osseous demineralization. Comminuted depressed fracture of the medial LEFT tibial plateau extending into tibial spines. Dominant articular fracture fragment appears depressed and displaced anteriorly. No additional fracture, dislocation, or bone destruction. Associated knee joint effusion. IMPRESSION: Comminuted displaced intra-articular fracture at LEFT medial tibial plateau with associated joint effusion. Electronically Signed   By: Ulyses Southward M.D.   On: 07/29/2022 15:02    Scheduled Meds:  acetaminophen  1,000 mg Oral  Q6H   amLODipine  5 mg Oral Daily   aspirin  81 mg Oral Daily   docusate sodium  100 mg Oral BID   DULoxetine  30 mg Oral Daily   enoxaparin (LOVENOX) injection  40 mg Subcutaneous Q24H   insulin aspart  0-15 Units Subcutaneous TID WC   insulin aspart  0-5 Units Subcutaneous QHS   insulin glargine-yfgn  30 Units Subcutaneous Daily   irbesartan  300 mg Oral Daily   metoprolol succinate  50 mg Oral Daily   mupirocin ointment  1 Application Nasal BID   pantoprazole  40 mg Oral Daily   rosuvastatin  20 mg Oral QHS   Continuous Infusions:  sodium chloride Stopped (07/31/22 0135)   methocarbamol (ROBAXIN) IV       LOS: 2 days   Time spent: 35 minutes   Magdelena Kinsella Estill Cotta, MD [facility]  If 7PM-7AM, please contact night-coverage [email or web address] 07/31/2022, 12:57 PM

## 2022-07-31 NOTE — Evaluation (Signed)
Occupational Therapy Evaluation Patient Details Name: [name] MRN: [id number] DOB: 10/14/1945 Today's Date: 07/31/2022   History of Present Illness [name]  is a 77 y.o. female, with past medical history of CVA, hypertension, hyperlipidemia, GERD, OSA, patient presents to ED secondary to fall, she was brought via EMS, secondary to fall, for left knee and ankle pain, patient reports that she was standing on the bed, trying to go fly on the ceiling, she fell off on the bed, and onto the ground. Pt s/p ORIF L tibial plateau   Clinical Impression   Pt presents with decline in function and safety with ADLs and ADL mobility with impaired strength, balance and endurance. Pt is NWB L LE and reports decreased UE strength. PTA pt lived at home in a second floor apartment with he husband (has dementia and requires her daily assist), her daughter (works part time) and grand daughter (in nursing school). Pt was Ind  with ADLs, IADLs, home mgt, cooking, was driving and used no AD for mobility. Pt currently requires Sup with cues to sit EOB, max -total A with LB ADLs max A with toileting, mod - min A +2 with sit stand and SPTs. Pt able to maintina L LE NWB, however fatigues easily walking ~ 8 -10 ft with RW min A. Pt would benefit from acute OT services to address impairments to maximize level of function and safety     Recommendations for follow up therapy are one component of a multi-disciplinary discharge planning process, led by the attending physician.  Recommendations may be updated based on patient status, additional functional criteria and insurance authorization.   Assistance Recommended at Discharge Frequent or constant Supervision/Assistance  Patient can return home with the following A lot of help with bathing/dressing/bathroom;Two people to help with walking and/or transfers;Help with stairs or ramp for entrance    Functional Status Assessment  Patient has had a recent decline in  their functional status and demonstrates the ability to make significant improvements in function in a reasonable and predictable amount of time.  Equipment Recommendations  BSC/3in1;Wheelchair (measurements OT)    Recommendations for Other Services       Precautions / Restrictions Precautions Precautions: Fall Required Braces or Orthoses: Other Brace Other Brace: L Bledsoe brace when OOB Restrictions Weight Bearing Restrictions: Yes LLE Weight Bearing: Non weight bearing Other Position/Activity Restrictions: no L LE ROM restrictions per orhto MD in to see pt      Mobility Bed Mobility Overal bed mobility: Needs Assistance Bed Mobility: Supine to Sit     Supine to sit: Supervision, HOB elevated     General bed mobility comments: no phyical assist, cues for sequencing    Transfers Overall transfer level: Needs assistance Equipment used: Rolling walker (2 wheels) Transfers: Sit to/from Stand, Bed to chair/wheelchair/BSC Sit to Stand: Mod assist, Min assist, +2 physical assistance Stand pivot transfers: Min assist, +2 physical assistance         General transfer comment: mod A +2 with initial stand from EOB to RW, min A +2 from [facility], cues for hand placement. Pt fatigues easily      Balance Overall balance assessment: Needs assistance Sitting-balance support: Bilateral upper extremity supported Sitting balance-Leahy Scale: Fair     Standing balance support: Bilateral upper extremity supported, Reliant on assistive device for balance, During functional activity Standing balance-Leahy Scale: Poor  ADL either performed or assessed with clinical judgement   ADL Overall ADL's : Needs assistance/impaired Eating/Feeding: Independent;Sitting   Grooming: Set up;Supervision/safety;Sitting   Upper Body Bathing: Min guard;Sitting   Lower Body Bathing: Maximal assistance   Upper Body Dressing : Min guard;Sitting   Lower Body  Dressing: Total assistance   Toilet Transfer: Minimal assistance;+2 for physical assistance;Rolling walker (2 wheels);Stand-pivot;BSC/3in1;Cueing for safety;Cueing for sequencing   Toileting- Clothing Manipulation and Hygiene: Maximal assistance       Functional mobility during ADLs: Moderate assistance;Minimal assistance;+2 for physical assistance;Cueing for safety;Cueing for sequencing;Rolling walker (2 wheels)       Vision Baseline Vision/History: 1 Wears glasses Ability to See in Adequate Light: 0 Adequate Patient Visual Report: No change from baseline       Perception     Praxis      Pertinent Vitals/Pain Pain Assessment Pain Assessment: 0-10 Pain Score: 7  Pain Location: L LE, 4/10 at rest, 7/10 with activity Pain Descriptors / Indicators: Aching, Discomfort, Grimacing Pain Intervention(s): Premedicated before session, Monitored during session, Limited activity within patient's tolerance, Repositioned     Hand Dominance Right   Extremity/Trunk Assessment Upper Extremity Assessment Upper Extremity Assessment: Generalized weakness   Lower Extremity Assessment Lower Extremity Assessment: Defer to PT evaluation       Communication Communication Communication: No difficulties   Cognition   Behavior During Therapy: WFL for tasks assessed/performed Overall Cognitive Status: Within Functional Limits for tasks assessed                                       General Comments       Exercises     Shoulder Instructions      Home Living Family/patient expects to be discharged to:: Private residence Living Arrangements: Spouse/significant other;Children Available Help at Discharge: Family (husband has dementia and requires assist, daughter works part time and grand daughter in nursing school) Type of Home: Apartment Home Access: Stairs to enter Secretary/administrator of Steps: flight, 12-15 steps Entrance Stairs-Rails: Right;Left;Can reach  both Home Layout: One level     Bathroom Shower/Tub: Chief Strategy Officer: Standard     Home Equipment: Engineer, agricultural (2 wheels);Electric scooter;BSC/3in1;Shower seat;Tub bench;Grab bars - tub/shower          Prior Functioning/Environment Prior Level of Function : Independent/Modified Independent             Mobility Comments: no AD needed for mobility ADLs Comments: Ind with ADLs/selfcare, IADLs, home mgt, was driving        OT Problem List: Decreased strength;Decreased activity tolerance;Decreased knowledge of use of DME or AE;Impaired balance (sitting and/or standing);Decreased coordination;Pain      OT Treatment/Interventions: Self-care/ADL training;Therapeutic exercise;DME and/or AE instruction;Therapeutic activities;Patient/family education;Balance training    OT Goals(Current goals can be found in the care plan section) Acute Rehab OT Goals Patient Stated Goal: go home OT Goal Formulation: With patient Time For Goal Achievement: 08/14/22 Potential to Achieve Goals: Good ADL Goals Pt Will Perform Grooming: with set-up;sitting Pt Will Perform Upper Body Bathing: with supervision;with set-up;sitting Pt Will Perform Lower Body Bathing: with mod assist;with min assist;with adaptive equipment Pt Will Perform Upper Body Dressing: with supervision;with set-up;sitting Pt Will Perform Lower Body Dressing: with max assist;with mod assist;with adaptive equipment;with caregiver independent in assisting Pt Will Transfer to Toilet: with mod assist;with min assist;bedside commode Pt Will Perform Toileting - Clothing Manipulation and  hygiene: with mod assist;with min assist;sitting/lateral leans;sit to/from stand  OT Frequency: Min 2X/week    Co-evaluation PT/OT/SLP Co-Evaluation/Treatment: Yes Reason for Co-Treatment: For patient/therapist safety;To address functional/ADL transfers   OT goals addressed during session: ADL's and self-care;Proper use of  Adaptive equipment and DME      AM-PAC OT "6 Clicks" Daily Activity     Outcome Measure Help from another person eating meals?: None Help from another person taking care of personal grooming?: A Little Help from another person toileting, which includes using toliet, bedpan, or urinal?: A Lot Help from another person bathing (including washing, rinsing, drying)?: A Lot Help from another person to put on and taking off regular upper body clothing?: A Little Help from another person to put on and taking off regular lower body clothing?: Total 6 Click Score: 15   End of Session Equipment Utilized During Treatment: Gait belt;Rolling walker (2 wheels);Other (comment) (BSC, L Bledsoe brace)  Activity Tolerance: Patient limited by fatigue;Patient limited by pain Patient left: in chair;with call bell/phone within reach;with chair alarm set  OT Visit Diagnosis: Unsteadiness on feet (R26.81);Other abnormalities of gait and mobility (R26.89);Muscle weakness (generalized) (M62.81);Pain Pain - Right/Left: Left Pain - part of body: Leg                Time: 4782-9562 OT Time Calculation (min): 36 min Charges:  OT General Charges $OT Visit: 1 Visit OT Evaluation $OT Eval Moderate Complexity: 1 Mod    Galen Manila 07/31/2022, 12:34 PM

## 2022-07-31 NOTE — Evaluation (Signed)
Physical Therapy Evaluation Patient Details Name: [name] MRN: [id number] DOB: 27-Aug-1945 Today's Date: 07/31/2022  History of Present Illness  77 y/o F admitted to [facility] on 5/9 with L knee pain after fall from standing on the bed. Imaging revealed L comminuted tibial plateau fracture. S/p ORIF 5/10. PMHx: DM, HTN, CVA, HLD, GERD, OSA  Clinical Impression  Pt presents today s/p L ORIF of L tibial plateau, now NWB LLE with Bledsoe brace when OOB. Pt is independent with mobility at baseline but was limited during today's session by pain, decreased balance and strength, and impaired activity tolerance. Pt requiring minAx2-modAx2 for transfers today with minA for ambulation with a close chair follow due to fatigue. Even with static standing pt requiring minA due to unsteadiness and requiring cueing for maintaining LLE NWB status. Pt is motivated to return home and progress with mobility, but unfortunately does not have full time care and has a flight of steps to enter her home. Pt's husband is home but he has a history of dementia and the pt is his caregiver. Educated pt on importance of being able to safely mobilize prior to returning home and needing to access her home with stairs, pt fatiguing with room ambulation today. At this time, recommend high intensity PT at discharge to maximize independence with mobility prior to returning home. Acute PT will follow during admission to progress mobility as able.        Recommendations for follow up therapy are one component of a multi-disciplinary discharge planning process, led by the attending physician.  Recommendations may be updated based on patient status, additional functional criteria and insurance authorization.  Follow Up Recommendations       Assistance Recommended at Discharge Frequent or constant Supervision/Assistance  Patient can return home with the following  Two people to help with walking and/or transfers;Two people to help with  bathing/dressing/bathroom;Assistance with cooking/housework;Assist for transportation;Help with stairs or ramp for entrance    Equipment Recommendations Wheelchair (measurements PT);Wheelchair cushion (measurements PT)  Recommendations for Other Services  Rehab consult    Functional Status Assessment Patient has had a recent decline in their functional status and demonstrates the ability to make significant improvements in function in a reasonable and predictable amount of time.     Precautions / Restrictions Precautions Precautions: Fall Required Braces or Orthoses: Other Brace Other Brace: L Bledsoe brace when OOB Restrictions Weight Bearing Restrictions: Yes LLE Weight Bearing: Non weight bearing Other Position/Activity Restrictions: ROM as tolerated to L knee      Mobility  Bed Mobility Overal bed mobility: Needs Assistance Bed Mobility: Supine to Sit     Supine to sit: Supervision, HOB elevated     General bed mobility comments: increased time to complete with cueing and heavy reliance on BUE to assist in scooting    Transfers Overall transfer level: Needs assistance Equipment used: Rolling walker (2 wheels) Transfers: Sit to/from Stand, Bed to chair/wheelchair/BSC Sit to Stand: Mod assist, Min assist, +2 physical assistance Stand pivot transfers: Min assist, +2 physical assistance         General transfer comment: modAx2 to power up and steady from EOB, cueing for proper technique and ensuring LLE weight bearing precautions. Pt able to stand from [facility] with minAx2, noted seated rest breaks required due to fatigue    Ambulation/Gait Ambulation/Gait assistance: Min assist, +2 safety/equipment Gait Distance (Feet): 10 Feet Assistive device: Rolling walker (2 wheels) Gait Pattern/deviations: Decreased step length - right, Trunk flexed Gait velocity: decreased  General Gait Details: hopping gait pattern, minA for balance with a second person assist for chair  follow as pt fatigues easily. Cueing throughout for maintaining LLE off the ground with ambulation with pt reporting pain in hip with holding her leg off the ground  Stairs            Wheelchair Mobility    Modified Rankin (Stroke Patients Only)       Balance Overall balance assessment: Needs assistance Sitting-balance support: Bilateral upper extremity supported Sitting balance-Leahy Scale: Fair     Standing balance support: Bilateral upper extremity supported, Reliant on assistive device for balance, During functional activity, Single extremity supported Standing balance-Leahy Scale: Poor Standing balance comment: reliant on RW for ambulation and with static standing. Pt requiring min-modA for static standing with 1UE support for hygiene                             Pertinent Vitals/Pain Pain Assessment Pain Assessment: 0-10 Pain Score: 7  Pain Location: LLE Pain Descriptors / Indicators: Aching, Discomfort, Grimacing Pain Intervention(s): Limited activity within patient's tolerance, Monitored during session, Repositioned, Premedicated before session    Home Living Family/patient expects to be discharged to:: Private residence Living Arrangements: Spouse/significant other;Children Available Help at Discharge: Family Type of Home: Apartment Home Access: Stairs to enter Entrance Stairs-Rails: Right;Left;Can reach both Secretary/administrator of Steps: 15   Home Layout: One level Home Equipment: Engineer, agricultural (2 wheels);BSC/3in1;Shower seat;Tub bench;Grab bars - tub/shower (doesn't think she has an Art gallery manager anymore, had one previously) Additional Comments: lives with husband who has dementia and pt assists with his care, daughter works and granddaughter is in nursing school    Prior Function Prior Level of Function : Independent/Modified Independent             Mobility Comments: independent with all mobility, reports only recent fall  was this one, reports standing on the bed to get a bug and falling off ADLs Comments: Ind with ADLs/selfcare, IADLs, home mgt, was driving     Hand Dominance   Dominant Hand: Right    Extremity/Trunk Assessment   Upper Extremity Assessment Upper Extremity Assessment: Defer to OT evaluation    Lower Extremity Assessment Lower Extremity Assessment: LLE deficits/detail LLE Deficits / Details: sensation in tact with active PF/DF, limited by pain, able to perform quad set but not full AROM with hip flexion due to pain    Cervical / Trunk Assessment Cervical / Trunk Assessment: Normal  Communication   Communication: No difficulties  Cognition Arousal/Alertness: Awake/alert Behavior During Therapy: WFL for tasks assessed/performed Overall Cognitive Status: Within Functional Limits for tasks assessed                                 General Comments: A&Ox4, mild education required to level of injury and deficits as well as assistance needed at discharge        General Comments General comments (skin integrity, edema, etc.): wrap on LLE with MD arriving at the beginning of the session for donning Bledsoe brace    Exercises     Assessment/Plan    PT Assessment Patient needs continued PT services  PT Problem List Decreased strength;Decreased activity tolerance;Decreased balance;Decreased mobility;Decreased knowledge of use of DME;Decreased safety awareness;Decreased knowledge of precautions;Pain       PT Treatment Interventions DME instruction;Gait training;Stair training;Functional mobility training;Therapeutic activities;Therapeutic exercise;Balance training;Neuromuscular re-education;Patient/family  education;Wheelchair mobility training    PT Goals (Current goals can be found in the Care Plan section)  Acute Rehab PT Goals Patient Stated Goal: go home PT Goal Formulation: With patient Time For Goal Achievement: 08/14/22 Potential to Achieve Goals: Good     Frequency Min 4X/week     Co-evaluation PT/OT/SLP Co-Evaluation/Treatment: Yes Reason for Co-Treatment: For patient/therapist safety;To address functional/ADL transfers PT goals addressed during session: Mobility/safety with mobility;Balance;Proper use of DME OT goals addressed during session: ADL's and self-care;Proper use of Adaptive equipment and DME       AM-PAC PT "6 Clicks" Mobility  Outcome Measure Help needed turning from your back to your side while in a flat bed without using bedrails?: A Little Help needed moving from lying on your back to sitting on the side of a flat bed without using bedrails?: A Little Help needed moving to and from a bed to a chair (including a wheelchair)?: A Lot Help needed standing up from a chair using your arms (e.g., wheelchair or bedside chair)?: Total Help needed to walk in hospital room?: Total Help needed climbing 3-5 steps with a railing? : Total 6 Click Score: 11    End of Session Equipment Utilized During Treatment: Gait belt Activity Tolerance: Patient limited by fatigue Patient left: in chair;with call bell/phone within reach;with bed alarm set (cord missing for nurse alert) Nurse Communication: Mobility status PT Visit Diagnosis: Muscle weakness (generalized) (M62.81);History of falling (Z91.81);Difficulty in walking, not elsewhere classified (R26.2);Other abnormalities of gait and mobility (R26.89);Pain Pain - Right/Left: Left Pain - part of body: Hip    Time: 4098-1191 PT Time Calculation (min) (ACUTE ONLY): 36 min   Charges:   PT Evaluation $PT Eval Moderate Complexity: 1 Mod          Lindalou Hose, PT DPT Acute Rehabilitation Services Office [phone]   Leonie Man 07/31/2022, 1:11 PM

## 2022-07-31 NOTE — Progress Notes (Signed)
Patient placed herself on home CPAP for the night  

## 2022-07-31 NOTE — Plan of Care (Signed)

## 2022-08-01 DIAGNOSIS — S82192A Other fracture of upper end of left tibia, initial encounter for closed fracture: Secondary | ICD-10-CM | POA: Diagnosis not present

## 2022-08-01 DIAGNOSIS — E119 Type 2 diabetes mellitus without complications: Secondary | ICD-10-CM | POA: Diagnosis not present

## 2022-08-01 LAB — BASIC METABOLIC PANEL
Anion gap: 7 (ref 5–15)
BUN: 15 mg/dL (ref 8–23)
CO2: 25 mmol/L (ref 22–32)
Calcium: 8.9 mg/dL (ref 8.9–10.3)
Chloride: 105 mmol/L (ref 98–111)
Creatinine, Ser: 0.88 mg/dL (ref 0.44–1.00)
GFR, Estimated: >60 mL/min (ref >60)
Glucose, Bld: 158 mg/dL — ABNORMAL HIGH (ref 70–99)
Potassium: 4.7 mmol/L (ref 3.5–5.1)
Sodium: 137 mmol/L (ref 135–145)

## 2022-08-01 LAB — CBC
HCT: 29.9 % — ABNORMAL LOW (ref 36.0–46.0)
Hemoglobin: 9.8 g/dL — ABNORMAL LOW (ref 12.0–15.0)
MCH: 27.2 pg (ref 26.0–34.0)
MCHC: 32.8 g/dL (ref 30.0–36.0)
MCV: 83.1 fL (ref 80.0–100.0)
Platelets: 209 10*3/uL (ref 150–400)
RBC: 3.6 MIL/uL — ABNORMAL LOW (ref 3.87–5.11)
RDW: 14.6 % (ref 11.5–15.5)
WBC: 11.8 10*3/uL — ABNORMAL HIGH (ref 4.0–10.5)
nRBC: 0 % (ref 0.0–0.2)

## 2022-08-01 LAB — GLUCOSE, CAPILLARY
Glucose-Capillary: 180 mg/dL — ABNORMAL HIGH (ref 70–99)
Glucose-Capillary: 142 mg/dL — ABNORMAL HIGH (ref 70–99)
Glucose-Capillary: 152 mg/dL — ABNORMAL HIGH (ref 70–99)
Glucose-Capillary: 117 mg/dL — ABNORMAL HIGH (ref 70–99)

## 2022-08-01 NOTE — Progress Notes (Signed)
Physical Therapy Treatment Patient Details Name: [name] MRN: [id number] DOB: 01/28/1946 Today's Date: 08/01/2022   History of Present Illness 77 y/o F admitted to [facility] on 5/9 with L knee pain after fall from standing on the bed. Imaging revealed L comminuted tibial plateau fracture. S/p ORIF 5/10. PMHx: DM, HTN, CVA, HLD, GERD, OSA    PT Comments    Pt tolerated today's session well but continues to remain limited by fatigue with mobility. Pt requires both seated and standing rest breaks with ambulation in the room today, requiring minA for ambulation and bed mobility and modA for initial stand, progressing to minA. Pt does well with maintaining her weight bearing precautions but continues to requires assist for balance and powering-up into standing. Pt noted to be significantly fatigued after ambulating to the bathroom and to her recliner. Educated pt on needing to progress endurance and safety with mobility, however pt continues to decline any further rehab besides returning home, therefore discharge recommendations updated, even though pt would benefit from brief subacute rehab stay. Pt will need a wheelchair upon discharge and continues to be limited by the flight of stairs to enter her home. Acute PT will continue to follow as appropriate.     Recommendations for follow up therapy are one component of a multi-disciplinary discharge planning process, led by the attending physician.  Recommendations may be updated based on patient status, additional functional criteria and insurance authorization.  Follow Up Recommendations       Assistance Recommended at Discharge Frequent or constant Supervision/Assistance  Patient can return home with the following Assistance with cooking/housework;Assist for transportation;Help with stairs or ramp for entrance;A lot of help with walking and/or transfers;A lot of help with bathing/dressing/bathroom   Equipment Recommendations  Wheelchair  (measurements PT);Wheelchair cushion (measurements PT)    Recommendations for Other Services       Precautions / Restrictions Precautions Precautions: Fall Required Braces or Orthoses: Other Brace Other Brace: L Bledsoe brace when OOB Restrictions Weight Bearing Restrictions: Yes LLE Weight Bearing: Non weight bearing Other Position/Activity Restrictions: ROM as tolerated to L knee     Mobility  Bed Mobility Overal bed mobility: Needs Assistance Bed Mobility: Supine to Sit     Supine to sit: Min assist, HOB elevated     General bed mobility comments: minA for trunk support and increased time to complete    Transfers Overall transfer level: Needs assistance Equipment used: Rolling walker (2 wheels) Transfers: Sit to/from Stand Sit to Stand: Mod assist, Min assist           General transfer comment: min-modA to stand, cueing for proper hand positioning and management of LLE. Pt standing from bed, straight leg chair, and toilet with use of grab bar    Ambulation/Gait Ambulation/Gait assistance: Min assist Gait Distance (Feet): 18 Feet (for last trial, 2 trials of 8 feet. With 20' trial pt with 3 brief standing rest breaks due to fatigue and to rest BUE) Assistive device: Rolling walker (2 wheels) Gait Pattern/deviations: Decreased step length - right, Trunk flexed Gait velocity: decreased     General Gait Details: hopping gait pattern, minA for balance, pt fatiguing easily requiring seated or standing rest breaks with room ambulation. Cueing throughout for maintaining LLE off the ground, good management throughout. cued for utilizing BUE to decrease hopping impact and controlled steps but pt reports increased fatigue in Soil scientist  Modified Rankin (Stroke Patients Only)       Balance Overall balance assessment: Needs assistance Sitting-balance support: Bilateral upper extremity supported, Feet supported Sitting  balance-Leahy Scale: Fair     Standing balance support: Bilateral upper extremity supported, Reliant on assistive device for balance, During functional activity Standing balance-Leahy Scale: Poor Standing balance comment: reliant on RW                            Cognition Arousal/Alertness: Awake/alert Behavior During Therapy: WFL for tasks assessed/performed Overall Cognitive Status: Within Functional Limits for tasks assessed                                          Exercises      General Comments General comments (skin integrity, edema, etc.): Bledsoe brace donned prior to getting OOB. Pt fatigues with mobility but able to recover with seated rest breaks, utilizing brief standing rest breaks during a longer ambulation trial. With 18 feet of ambulation, pt reports 9/10 fatigue      Pertinent Vitals/Pain Pain Assessment Pain Assessment: 0-10 Pain Score: 9  Pain Location: L hip Pain Descriptors / Indicators: Aching, Discomfort, Grimacing Pain Intervention(s): Limited activity within patient's tolerance, Monitored during session, Repositioned    Home Living                          Prior Function            PT Goals (current goals can now be found in the care plan section) Acute Rehab PT Goals Patient Stated Goal: go home PT Goal Formulation: With patient Time For Goal Achievement: 08/14/22 Potential to Achieve Goals: Good Progress towards PT goals: Progressing toward goals    Frequency    Min 4X/week      PT Plan Discharge plan needs to be updated    Co-evaluation              AM-PAC PT "6 Clicks" Mobility   Outcome Measure  Help needed turning from your back to your side while in a flat bed without using bedrails?: A Little Help needed moving from lying on your back to sitting on the side of a flat bed without using bedrails?: A Little Help needed moving to and from a bed to a chair (including a wheelchair)?: A  Little Help needed standing up from a chair using your arms (e.g., wheelchair or bedside chair)?: A Little Help needed to walk in hospital room?: Total Help needed climbing 3-5 steps with a railing? : Total 6 Click Score: 14    End of Session Equipment Utilized During Treatment: Gait belt Activity Tolerance: Patient limited by fatigue Patient left: in chair;with call bell/phone within reach Nurse Communication: Mobility status PT Visit Diagnosis: Muscle weakness (generalized) (M62.81);History of falling (Z91.81);Difficulty in walking, not elsewhere classified (R26.2);Other abnormalities of gait and mobility (R26.89);Pain Pain - Right/Left: Left Pain - part of body: Hip     Time: 1201-1227 PT Time Calculation (min) (ACUTE ONLY): 26 min  Charges:  $Gait Training: 8-22 mins $Therapeutic Activity: 8-22 mins                     Lindalou Hose, PT DPT Acute Rehabilitation Services Office [phone]    [name] 08/01/2022, 2:33 PM

## 2022-08-01 NOTE — Progress Notes (Addendum)
Orthopaedic Trauma Progress Note  SUBJECTIVE: Patient doing fairly well this morning.  Pain decently well-controlled. No chest pain. No SOB. No nausea/vomiting. No other complaints.  Therapies were more difficult than she expected yesterday. Hyperkalemia resolved. Patient states she would like to go home at d/c to be with her husband. She doesn't want to pursue a SNF.  OBJECTIVE:  Vitals:   07/31/22 2025 08/01/22 0452  BP: (!) 125/55 (!) 117/54  Pulse: 72 73  Resp: 16 16  Temp: 98.5 F (36.9 C) 98 F (36.7 C)  SpO2: 98% 96%    General: Sitting up in bed comfortably, no acute distress.  Pleasant and cooperative Respiratory: No increased work of breathing.  Left lower extremity: Dressings removed, incisions clean, dry, intact.  Tenderness over the proximal tibia as expected.  Ankle dorsiflexion/plantarflexion intact.  Endorses sensation to light touch over all aspects of the foot. + DP pulse  IMAGING: Stable post op imaging.   LABS:  Results for orders placed or performed during the hospital encounter of 07/29/22 (from the past 24 hour(s))  Glucose, capillary     Status: Abnormal   Collection Time: 07/31/22  9:38 AM  Result Value Ref Range   Glucose-Capillary 179 (H) 70 - 99 mg/dL  Glucose, capillary     Status: Abnormal   Collection Time: 07/31/22 12:42 PM  Result Value Ref Range   Glucose-Capillary 200 (H) 70 - 99 mg/dL  Glucose, capillary     Status: Abnormal   Collection Time: 07/31/22  4:25 PM  Result Value Ref Range   Glucose-Capillary 119 (H) 70 - 99 mg/dL  Glucose, capillary     Status: Abnormal   Collection Time: 07/31/22 11:05 PM  Result Value Ref Range   Glucose-Capillary 175 (H) 70 - 99 mg/dL  CBC     Status: Abnormal   Collection Time: 08/01/22  5:00 AM  Result Value Ref Range   WBC 11.8 (H) 4.0 - 10.5 K/uL   RBC 3.60 (L) 3.87 - 5.11 MIL/uL   Hemoglobin 9.8 (L) 12.0 - 15.0 g/dL   HCT 16.1 (L) 09.6 - 04.5 %   MCV 83.1 80.0 - 100.0 fL   MCH 27.2 26.0 - 34.0 pg    MCHC 32.8 30.0 - 36.0 g/dL   RDW 40.9 81.1 - 91.4 %   Platelets 209 150 - 400 K/uL   nRBC 0.0 0.0 - 0.2 %  Basic metabolic panel     Status: Abnormal   Collection Time: 08/01/22  5:00 AM  Result Value Ref Range   Sodium 137 135 - 145 mmol/L   Potassium 4.7 3.5 - 5.1 mmol/L   Chloride 105 98 - 111 mmol/L   CO2 25 22 - 32 mmol/L   Glucose, Bld 158 (H) 70 - 99 mg/dL   BUN 15 8 - 23 mg/dL   Creatinine, Ser 7.82 0.44 - 1.00 mg/dL   Calcium 8.9 8.9 - 95.6 mg/dL   GFR, Estimated >21 >30 mL/min   Anion gap 7 5 - 15    ASSESSMENT: [name] is a 77 y.o. female, 2 Days Post-Op s/p OPEN REDUCTION INTERNAL FIXATION LEFT TIBIAL PLATEAU  CV/Blood loss: Acute blood loss anemia, Hgb 9.8 this morning. Hemodynamically stable  PLAN: Weightbearing: NWB LLE ROM:  Ok for ROM as tolerated  Incisional and dressing care: Okay to leave incisions open to air Showering: Okay to begin getting incisions wet 08/02/2022 Orthopedic device(s):  Bledsoe brace when OOB Pain management:  1. Tylenol 1000 mg q 6  hours scheduled 2. Robaxin 500 mg q 6 hours PRN 3. Oxycodone 5 mg q 4 hours PRN 4. Dilaudid 0.5 mg q 4 hours PRN VTE prophylaxis: Lovenox, SCDs ID:  Ancef 2gm post op completed Foley/Lines:  No foley, KVO IVFs Impediments to Fracture Healing: Vitamin D level 64, no additional supplementation needed Dispo: PT/OT evaluation ongoing. Okay for discharge from ortho standpoint once cleared by medicine team and therapies  D/C recommendations: -Oxycodone and Robaxin for pain control -Aspirin 325 mg daily for DVT prophylaxis -No additional need for Vit D supplementation  Follow - up plan: 2 weeks after discharge for wound check and repeat x-rays   Contact information:  Truitt Merle MD, Hannah Breed PA-C. After hours and holidays please check [email or web address] for group call information for [facility]   Thompson Caul, PA-C [phone] (office) [email or web address]

## 2022-08-01 NOTE — Progress Notes (Addendum)
PROGRESS NOTE    [name]  ZOX:[id number] DOB: [birth year] DOA: 07/29/2022 PCP: Geoffry Paradise, MD   Brief Narrative:  [name]  is a 77 y.o. female, with past medical history of CVA, hypertension, hyperlipidemia, GERD, OSA, patient presents to ED secondary to fall, she was brought via EMS, for left knee and ankle pain.  She was standing on the bed, trying to go fly on the ceiling, she fell off on the bed, and onto the ground, she denies any head trauma, loss of consciousness, she could not get off the ground secondary to pain, mainly in the left ankle area.  - in ED workup revealed proximal tibial fracture.  Status post repair on 07/30/2022.  08/01/2022: The patient was seen and examined at bedside.  Endorses pain with weightbearing.  Seen by orthopedic surgery today.  Assessment & Plan:   Left fibular fracture status post repair on 07/30/2022: -Secondary to mechanical fall, she sustained Comminuted fracture of the both medial and lateral tibial plateau predominantly involving the medial tibial plateau (Schatzker type 5) -Status post open reduction internal fixation of left bicondylar tibial plateau fracture on 07/30/2022. Continue as needed analgesics.  Aspirin 325 mg daily for DVT prophylaxis at discharge. -Follow-up with Ortho in 2 weeks after the discharge for wound check and repeat x-rays. -PT evaluation recommended CIR.   Hypertension -Blood pressure is stable.  Continue home medication-amlodipine, irbesartan metoprolol.  Continue hydralazine as needed Continue to closely monitor vital signs.   History of CVA -Continue secondary CVA prevention with aspirin, statin   Diabetes mellitus, type II with hyperglycemia Continue Semglee coverage and insulin sliding scale.   GERD -Continue with PPI   OSA -Continue with CPAP  Hyperlipidemia: Continue rosuvastatin  Normocytic anemia: Continue to monitor H&H  Resolved hyperkalemia:  Leukocytosis: Improving.  Likely  reactive.  Will continue to monitor.  She is afebrile.  WBC 11.8.  DVT prophylaxis: Subcu Lovenox daily Code Status: Full code Family Communication: None at bedside. Disposition Plan: Possible CIR placement  Consultants:  Ortho  Procedures:  None  Antimicrobials:  None  Status is: Inpatient     Objective: Vitals:   07/31/22 2025 08/01/22 0452 08/01/22 0834 08/01/22 1637  BP: (!) 125/55 (!) 117/54 118/62 (!) 127/56  Pulse: 72 73 70 76  Resp: 16 16 17 16   Temp: 98.5 F (36.9 C) 98 F (36.7 C) 98.8 F (37.1 C) 99.4 F (37.4 C)  TempSrc:   Oral   SpO2: 98% 96% 99% 98%  Weight:      Height:        Intake/Output Summary (Last 24 hours) at 08/01/2022 1737 Last data filed at 08/01/2022 0910 Gross per 24 hour  Intake 180 ml  Output --  Net 180 ml   Filed Weights   07/29/22 1409 07/30/22 1138  Weight: 78.9 kg 78.9 kg    Examination:  General exam: Well-developed well-nourished in no acute assessed.  Pleasant. Respiratory system: Clear to auscultation no wheezes or rales. Cardiovascular system: Regular rate and rhythm no rubs or gallops. Gastrointestinal system: Abdomen is nondistended, soft and nontender. No organomegaly or masses felt. Normal bowel sounds heard. Central nervous system: Alert and oriented. No focal neurological deficits. Extremities: Left lower extremity is edematous. Psychiatry: Judgement and insight appear normal. Mood & affect appropriate.    Data Reviewed: I have personally reviewed following labs and imaging studies  CBC: Recent Labs  Lab 07/29/22 1449 07/30/22 0641 07/30/22 1623 07/31/22 0413 08/01/22 0500  WBC 9.4 11.1* 13.7*  11.0* 11.8*  HGB 12.5 11.3* 10.9* 10.3* 9.8*  HCT 37.5 35.1* 33.5* 31.0* 29.9*  MCV 81.2 83.8 80.7 80.3 83.1  PLT 252 212 219 209 209   Basic Metabolic Panel: Recent Labs  Lab 07/29/22 1449 07/30/22 0641 07/30/22 1623 07/31/22 0407 07/31/22 0413 08/01/22 0500  NA 135 137  --   --  134* 137  K 3.7  4.5  --   --  5.2* 4.7  CL 103 104  --   --  100 105  CO2 21* 24  --   --  24 25  GLUCOSE 188* 140*  --   --  242* 158*  BUN 17 16  --   --  14 15  CREATININE 0.70 0.82 0.90  --  0.88 0.88  CALCIUM 8.9 9.0  --   --  9.0 8.9  MG  --   --   --  1.9  --   --    GFR: Estimated Creatinine Clearance: 54.9 mL/min (by C-G formula based on SCr of 0.88 mg/dL). Liver Function Tests: No results for input(s): "AST", "ALT", "ALKPHOS", "BILITOT", "PROT", "ALBUMIN" in the last 168 hours. No results for input(s): "LIPASE", "AMYLASE" in the last 168 hours. No results for input(s): "AMMONIA" in the last 168 hours. Coagulation Profile: No results for input(s): "INR", "PROTIME" in the last 168 hours. Cardiac Enzymes: No results for input(s): "CKTOTAL", "CKMB", "CKMBINDEX", "TROPONINI" in the last 168 hours. BNP (last 3 results) No results for input(s): "PROBNP" in the last 8760 hours. HbA1C: No results for input(s): "HGBA1C" in the last 72 hours. CBG: Recent Labs  Lab 07/31/22 1625 07/31/22 2305 08/01/22 0829 08/01/22 1215 08/01/22 1638  GLUCAP 119* 175* 180* 142* 152*   Lipid Profile: No results for input(s): "CHOL", "HDL", "LDLCALC", "TRIG", "CHOLHDL", "LDLDIRECT" in the last 72 hours. Thyroid Function Tests: No results for input(s): "TSH", "T4TOTAL", "FREET4", "T3FREE", "THYROIDAB" in the last 72 hours. Anemia Panel: No results for input(s): "VITAMINB12", "FOLATE", "FERRITIN", "TIBC", "IRON", "RETICCTPCT" in the last 72 hours. Sepsis Labs: No results for input(s): "PROCALCITON", "LATICACIDVEN" in the last 168 hours.  Recent Results (from the past 240 hour(s))  Surgical PCR screen     Status: Abnormal   Collection Time: 07/30/22  1:31 AM   Specimen: Nasal Mucosa; Nasal Swab  Result Value Ref Range Status   MRSA, PCR POSITIVE (A) NEGATIVE Final    Comment: RESULT CALLED TO, READ BACK BY AND VERIFIED WITH: R ALLEN,RN@0620  07/30/22 MK    Staphylococcus aureus POSITIVE (A) NEGATIVE Final     Comment: (NOTE) The Xpert SA Assay (FDA approved for NASAL specimens in patients 36 years of age and older), is one component of a comprehensive surveillance program. It is not intended to diagnose infection nor to guide or monitor treatment. Performed at [facility], [address], [place], [place] [address]       Radiology Studies: No results found.  Scheduled Meds:  acetaminophen  1,000 mg Oral Q6H   amLODipine  5 mg Oral Daily   aspirin  81 mg Oral Daily   docusate sodium  100 mg Oral BID   DULoxetine  30 mg Oral Daily   enoxaparin (LOVENOX) injection  40 mg Subcutaneous Q24H   insulin aspart  0-15 Units Subcutaneous TID WC   insulin aspart  0-5 Units Subcutaneous QHS   insulin glargine-yfgn  30 Units Subcutaneous Daily   irbesartan  300 mg Oral Daily   metoprolol succinate  50 mg Oral Daily  mupirocin ointment  1 Application Nasal BID   pantoprazole  40 mg Oral Daily   rosuvastatin  20 mg Oral QHS   Continuous Infusions:  sodium chloride Stopped (07/31/22 0135)   methocarbamol (ROBAXIN) IV       LOS: 3 days   Time spent: 35 minutes   Darlin Drop, MD [facility]  If 7PM-7AM, please contact night-coverage [email or web address] 08/01/2022, 5:37 PM

## 2022-08-01 NOTE — Plan of Care (Signed)

## 2022-08-02 ENCOUNTER — Inpatient Hospital Stay (HOSPITAL_COMMUNITY): Payer: Medicare HMO

## 2022-08-02 DIAGNOSIS — M7989 Other specified soft tissue disorders: Secondary | ICD-10-CM | POA: Diagnosis not present

## 2022-08-02 DIAGNOSIS — S82192A Other fracture of upper end of left tibia, initial encounter for closed fracture: Secondary | ICD-10-CM | POA: Diagnosis not present

## 2022-08-02 DIAGNOSIS — I82409 Acute embolism and thrombosis of unspecified deep veins of unspecified lower extremity: Secondary | ICD-10-CM

## 2022-08-02 HISTORY — DX: Acute embolism and thrombosis of unspecified deep veins of unspecified lower extremity: I82.409

## 2022-08-02 LAB — BASIC METABOLIC PANEL
Anion gap: 5 (ref 5–15)
BUN: 10 mg/dL (ref 8–23)
CO2: 23 mmol/L (ref 22–32)
Calcium: 8.3 mg/dL — ABNORMAL LOW (ref 8.9–10.3)
Chloride: 106 mmol/L (ref 98–111)
Creatinine, Ser: 0.66 mg/dL (ref 0.44–1.00)
GFR, Estimated: >60 mL/min (ref >60)
Glucose, Bld: 123 mg/dL — ABNORMAL HIGH (ref 70–99)
Potassium: 4.2 mmol/L (ref 3.5–5.1)
Sodium: 134 mmol/L — ABNORMAL LOW (ref 135–145)

## 2022-08-02 LAB — CBC
HCT: 28.9 % — ABNORMAL LOW (ref 36.0–46.0)
Hemoglobin: 9.4 g/dL — ABNORMAL LOW (ref 12.0–15.0)
MCH: 26.9 pg (ref 26.0–34.0)
MCHC: 32.5 g/dL (ref 30.0–36.0)
MCV: 82.6 fL (ref 80.0–100.0)
Platelets: 228 10*3/uL (ref 150–400)
RBC: 3.5 MIL/uL — ABNORMAL LOW (ref 3.87–5.11)
RDW: 14.6 % (ref 11.5–15.5)
WBC: 10.3 10*3/uL (ref 4.0–10.5)
nRBC: 0 % (ref 0.0–0.2)

## 2022-08-02 LAB — GLUCOSE, CAPILLARY
Glucose-Capillary: 120 mg/dL — ABNORMAL HIGH (ref 70–99)
Glucose-Capillary: 139 mg/dL — ABNORMAL HIGH (ref 70–99)
Glucose-Capillary: 143 mg/dL — ABNORMAL HIGH (ref 70–99)
Glucose-Capillary: 235 mg/dL — ABNORMAL HIGH (ref 70–99)
Glucose-Capillary: 88 mg/dL (ref 70–99)

## 2022-08-02 LAB — MAGNESIUM: Magnesium: 2 mg/dL (ref 1.7–2.4)

## 2022-08-02 MED ORDER — METHOCARBAMOL 500 MG PO TABS: 500.0000 mg | ORAL_TABLET | Freq: Four times a day (QID) | ORAL | 0 refills | Status: DC | PRN

## 2022-08-02 MED ORDER — ASPIRIN 325 MG PO TBEC
325.0000 mg | DELAYED_RELEASE_TABLET | Freq: Every day | ORAL | 0 refills | Status: DC
Start: 2022-08-02 — End: 2022-08-04

## 2022-08-02 MED ORDER — APIXABAN 5 MG PO TABS: 5.0000 mg | ORAL_TABLET | Freq: Two times a day (BID) | ORAL | Status: DC

## 2022-08-02 MED ORDER — APIXABAN 5 MG PO TABS
10.0000 mg | ORAL_TABLET | Freq: Two times a day (BID) | ORAL | Status: DC
  Administered 2022-08-02 – 2022-08-04 (×4): 10 mg via ORAL
  Filled 2022-08-02 (×4): qty 2

## 2022-08-02 MED ORDER — OXYCODONE HCL 5 MG PO TABS: 5.0000 mg | ORAL_TABLET | ORAL | 0 refills | Status: DC | PRN

## 2022-08-02 NOTE — Progress Notes (Signed)
ANTICOAGULATION CONSULT NOTE - Initial Consult  Pharmacy Consult for Eliquis Indication: DVT LLE  Allergies  Allergen Reactions   Other Other (See Comments)    NKDA    Patient Measurements: Height: 5\' 3"  (160 cm) Weight: 78.9 kg (173 lb 15.1 oz) IBW/kg (Calculated) : 52.4  Vital Signs: Temp: 98.2 F (36.8 C) (05/13 0817) Temp Source: Oral (05/13 0817) BP: 126/58 (05/13 0817) Pulse Rate: 76 (05/13 0817)  Labs: Recent Labs    07/31/22 0413 08/01/22 0500 08/02/22 0435  HGB 10.3* 9.8* 9.4*  HCT 31.0* 29.9* 28.9*  PLT 209 209 228  CREATININE 0.88 0.88 0.66    Estimated Creatinine Clearance: 60.4 mL/min (by C-G formula based on SCr of 0.66 mg/dL).   Medical History: Past Medical History:  Diagnosis Date   Anxiety    Depression    Essential hypertension    Eustachian tube dysfunction    Fibromyalgia    GERD (gastroesophageal reflux disease)    Hyperlipidemia    Hypothyroid    Mild carotid artery disease (HCC)    OSA (obstructive sleep apnea)    Osteopenia    Stroke (HCC)    Type 2 diabetes mellitus (HCC)     Medications:  Scheduled:   acetaminophen  1,000 mg Oral Q6H   amLODipine  5 mg Oral Daily   aspirin  81 mg Oral Daily   docusate sodium  100 mg Oral BID   DULoxetine  30 mg Oral Daily   enoxaparin (LOVENOX) injection  40 mg Subcutaneous Q24H   insulin aspart  0-15 Units Subcutaneous TID WC   insulin aspart  0-5 Units Subcutaneous QHS   insulin glargine-yfgn  30 Units Subcutaneous Daily   irbesartan  300 mg Oral Daily   metoprolol succinate  50 mg Oral Daily   mupirocin ointment  1 Application Nasal BID   pantoprazole  40 mg Oral Daily   rosuvastatin  20 mg Oral QHS   Infusions:   sodium chloride Stopped (07/31/22 0135)   methocarbamol (ROBAXIN) IV     PRN: bisacodyl, hydrALAZINE, HYDROmorphone (DILAUDID) injection, magnesium citrate, methocarbamol **OR** methocarbamol (ROBAXIN) IV, metoCLOPramide **OR** metoCLOPramide (REGLAN) injection,  ondansetron **OR** ondansetron (ZOFRAN) IV, oxyCODONE, polyethylene glycol, senna-docusate, traZODone  Assessment: 77 yo female s/p ORIF left tibial fracture 5/10. Now with acute LLE DVT - pharmacy consulted to dose eliquis. No anticoagulants prior to admission. Currently on aspirin 81mg  daily and lovenox 40mg  q24h - last dose at 08:52 today. Hgb 9.4 (12.5 pre-op), Plts WNL.  Goal of Therapy:  Therapeutic anticoagulation   Plan:  Eliquis 10mg  PO bid x 7 days, then 5mg  BID thereafter Will provide education and 30 day free coupon voucher Co-pay check tomorrow  Loralee Pacas, PharmD, BCPS Please see amion for complete clinical pharmacist phone list 08/02/2022,4:28 PM

## 2022-08-02 NOTE — Discharge Instructions (Addendum)
Orthopaedic Trauma Service Discharge Instructions   General Discharge Instructions  WEIGHT BEARING STATUS:Non-weightbearing left lower extremity  RANGE OF MOTION/ACTIVITY: Ok for knee range of motion as tolerated  Wound Care: Incisions can be left open to air if there is no drainage. Once the incision is completely dry and without drainage, it may be left open to air out.  Showering may begin Monday 08/02/22.  Clean incision gently with soap and water.  DVT/PE prophylaxis: Aspirin 325 mg daily x 30 days. You may resume your Aspirin 81 mg following this  Diet: as you were eating previously.  Can use over the counter stool softeners and bowel preparations, such as Miralax, to help with bowel movements.  Narcotics can be constipating.  Be sure to drink plenty of fluids  PAIN MEDICATION USE AND EXPECTATIONS  You have likely been given narcotic medications to help control your pain.  After a traumatic event that results in an fracture (broken bone) with or without surgery, it is ok to use narcotic pain medications to help control one's pain.  We understand that everyone responds to pain differently and each individual patient will be evaluated on a regular basis for the continued need for narcotic medications. Ideally, narcotic medication use should last no more than 6-8 weeks (coinciding with fracture healing).   As a patient it is your responsibility as well to monitor narcotic medication use and report the amount and frequency you use these medications when you come to your office visit.   We would also advise that if you are using narcotic medications, you should take a dose prior to therapy to maximize you participation.  IF YOU ARE ON NARCOTIC MEDICATIONS IT IS NOT PERMISSIBLE TO OPERATE A MOTOR VEHICLE (MOTORCYCLE/CAR/TRUCK/MOPED) OR HEAVY MACHINERY DO NOT MIX NARCOTICS WITH OTHER CNS (CENTRAL NERVOUS SYSTEM) DEPRESSANTS SUCH AS ALCOHOL   STOP SMOKING OR USING NICOTINE PRODUCTS!!!!  As  discussed nicotine severely impairs your body's ability to heal surgical and traumatic wounds but also impairs bone healing.  Wounds and bone heal by forming microscopic blood vessels (angiogenesis) and nicotine is a vasoconstrictor (essentially, shrinks blood vessels).  Therefore, if vasoconstriction occurs to these microscopic blood vessels they essentially disappear and are unable to deliver necessary nutrients to the healing tissue.  This is one modifiable factor that you can do to dramatically increase your chances of healing your injury.    (This means no smoking, no nicotine gum, patches, etc)  DO NOT USE NONSTEROIDAL ANTI-INFLAMMATORY DRUGS (NSAID'S)  Using products such as Advil (ibuprofen), Aleve (naproxen), Motrin (ibuprofen) for additional pain control during fracture healing can delay and/or prevent the healing response.  If you would like to take over the counter (OTC) medication, Tylenol (acetaminophen) is ok.  However, some narcotic medications that are given for pain control contain acetaminophen as well. Therefore, you should not exceed more than 4000 mg of tylenol in a day if you do not have liver disease.  Also note that there are may OTC medicines, such as cold medicines and allergy medicines that my contain tylenol as well.  If you have any questions about medications and/or interactions please ask your doctor/PA or your pharmacist.      ICE AND ELEVATE INJURED/OPERATIVE EXTREMITY  Using ice and elevating the injured extremity above your heart can help with swelling and pain control.  Icing in a pulsatile fashion, such as 20 minutes on and 20 minutes off, can be followed.    Do not place ice directly on skin.  Make sure there is a barrier between to skin and the ice pack.    Using frozen items such as frozen peas works well as the conform nicely to the are that needs to be iced.  USE AN ACE WRAP OR TED HOSE FOR SWELLING CONTROL  In addition to icing and elevation, Ace wraps or TED  hose are used to help limit and resolve swelling.  It is recommended to use Ace wraps or TED hose until you are informed to stop.    When using Ace Wraps start the wrapping distally (farthest away from the body) and wrap proximally (closer to the body)   Example: If you had surgery on your leg or thing and you do not have a splint on, start the ace wrap at the toes and work your way up to the thigh        If you had surgery on your upper extremity and do not have a splint on, start the ace wrap at your fingers and work your way up to the upper arm   CALL THE OFFICE WITH ANY QUESTIONS OR CONCERNS: [phone]   VISIT OUR WEBSITE FOR ADDITIONAL INFORMATION: [email or web address]    Discharge Wound Care Instructions  Do NOT apply any ointments, solutions or lotions to pin sites or surgical wounds.  These prevent needed drainage and even though solutions like hydrogen peroxide kill bacteria, they also damage cells lining the pin sites that help fight infection.  Applying lotions or ointments can keep the wounds moist and can cause them to breakdown and open up as well. This can increase the risk for infection. When in doubt call the office.  If any drainage is noted, use one layer of adaptic or Mepitel, then gauze, Kerlix, and an ace wrap. - These dressing supplies should be available at local medical supply stores [facility], [facility], etc) as well as Insurance claims handler ([facility], [facility], [place], etc)  Once the incision is completely dry and without drainage, it may be left open to air out.  Showering may begin 36-48 hours later.  Cleaning gently with soap and water.  Information on my medicine - ELIQUIS (apixaban)  Why was Eliquis prescribed for you? Eliquis was prescribed to treat blood clots that may have been found in the veins of your legs (deep vein thrombosis) or in your lungs (pulmonary embolism) and to reduce the risk of them occurring again.  What do You need to know about  Eliquis ? The starting dose is 10 mg (two 5 mg tablets) taken TWICE daily for the FIRST SEVEN (7) DAYS, then on May 21st  the dose is reduced to ONE 5 mg tablet taken TWICE daily.  Eliquis may be taken with or without food.   Try to take the dose about the same time in the morning and in the evening. If you have difficulty swallowing the tablet whole please discuss with your pharmacist how to take the medication safely.  Take Eliquis exactly as prescribed and DO NOT stop taking Eliquis without talking to the doctor who prescribed the medication.  Stopping may increase your risk of developing a new blood clot.  Refill your prescription before you run out.  After discharge, you should have regular check-up appointments with your healthcare provider that is prescribing your Eliquis.    What do you do if you miss a dose? If a dose of ELIQUIS is not taken at the scheduled time, take it as soon as possible on the same day and  twice-daily administration should be resumed. The dose should not be doubled to make up for a missed dose.  Important Safety Information A possible side effect of Eliquis is bleeding. You should call your healthcare provider right away if you experience any of the following: Bleeding from an injury or your nose that does not stop. Unusual colored urine (red or dark brown) or unusual colored stools (red or black). Unusual bruising for unknown reasons. A serious fall or if you hit your head (even if there is no bleeding).  Some medicines may interact with Eliquis and might increase your risk of bleeding or clotting while on Eliquis. To help avoid this, consult your healthcare provider or pharmacist prior to using any new prescription or non-prescription medications, including herbals, vitamins, non-steroidal anti-inflammatory drugs (NSAIDs) and supplements.  This website has more information on Eliquis (apixaban): http://www.eliquis.com/eliquis/home

## 2022-08-02 NOTE — TOC Initial Note (Signed)
Transition of Care [facility]) - Initial/Assessment Note    Patient Details  Name: [name] MRN: [id number] Date of Birth: 1945/05/19  Transition of [facility]) CM/SW Contact:    Chick Cousins A [place], Theresia Majors Phone Number: 08/02/2022, 4:19 PM  Clinical Narrative:                 TOC met with pt to discuss SNF referral. She said she had a preference for [facility] or [facility]. No specific facility requested.   SNF work up to be completed.   TOC will continue to follow.   Expected Discharge Plan: Skilled Nursing Facility Barriers to Discharge: SNF Pending bed offer   Patient Goals and CMS Choice Patient states their goals for this hospitalization and ongoing recovery are:: get stronger          Expected Discharge Plan and Services In-house Referral: Clinical Social Work     Living arrangements for the past 2 months: Single Family Home                                      Prior Living Arrangements/Services Living arrangements for the past 2 months: Single Family Home Lives with:: Domestic Partner Patient language and need for interpreter reviewed:: Yes        Need for Family Participation in Patient Care: Yes (Comment) Care giver support system in place?: Yes (comment)   Criminal Activity/Legal Involvement Pertinent to Current Situation/Hospitalization: No - Comment as needed  Activities of Daily Living Home Assistive Devices/Equipment: None ADL Screening (condition at time of admission) Patient's cognitive ability adequate to safely complete daily activities?: Yes Is the patient deaf or have difficulty hearing?: Yes Does the patient have difficulty seeing, even when wearing glasses/contacts?: No Does the patient have difficulty concentrating, remembering, or making decisions?: No Patient able to express need for assistance with ADLs?: Yes Does the patient have difficulty dressing or bathing?: Yes Independently performs ADLs?: No Communication:  Independent Does the patient have difficulty walking or climbing stairs?: Yes Weakness of Legs: Left Weakness of Arms/Hands: Left  Permission Sought/Granted      Share Information with NAME: Jeanett Schlein        Permission granted to share info w Contact Information: Cletis Media (Daughter)  [phone]  Emotional Assessment Appearance:: Appears stated age Attitude/Demeanor/Rapport: Engaged Affect (typically observed): Pleasant Orientation: : Oriented to Self, Oriented to Place, Oriented to  Time, Oriented to Situation Alcohol / Substance Use: Not Applicable Psych Involvement: No (comment)  Admission diagnosis:  Left tibial fracture [S82.202A] Closed fracture of left tibial plateau, initial encounter [S82.142A] Patient Active Problem List   Diagnosis Date Noted   Left tibial fracture 07/29/2022   Dyspnea on exertion 09/15/2020   CVA (cerebral vascular accident) (HCC) 04/20/2019   Pain in left hip 10/05/2016   Pain in right hip 10/05/2016   Trochanteric bursitis, right hip 10/05/2016   Trochanteric bursitis, left hip 10/05/2016   Chronic insomnia 01/28/2011   Seasonal and perennial allergic rhinitis 05/28/2010   Lung nodule 05/28/2010   HYPERLIPIDEMIA 11/23/2009   G E R D 11/23/2009   EUSTACHIAN TUBE DYSFUNCTION 11/18/2009   HYPERTENSION 11/18/2009   Unspecified hypothyroidism 03/14/2007   Obstructive sleep apnea 03/14/2007   FIBROMYALGIA 03/14/2007   PCP:  Geoffry Paradise, MD Pharmacy:   [facility] [phone] - [place], [place] - [address] AT [facility] [address] [place] [address]  Phone: [phone] Fax: [phone]     Social Determinants of Health (SDOH) Social History: SDOH Screenings   Food Insecurity: No Food Insecurity (07/30/2022)  Housing: Low Risk  (07/30/2022)  Transportation Needs: No Transportation Needs (07/30/2022)  Utilities: Not At Risk (07/30/2022)  Tobacco Use: Low Risk  (07/31/2022)   SDOH  Interventions:     Readmission Risk Interventions     No data to display

## 2022-08-02 NOTE — [place] FL2 (Signed)
[place] MEDICAID FL2 LEVEL OF CARE FORM     IDENTIFICATION  Patient Name: [name] Birthdate: May 27, 1945 Sex: female Admission Date (Current Location): 07/29/2022  [facility] and [place] Number:  [employer] and Address:  [place]. [facility], [address] [place], [place] [address]      Provider Number: [id number]  Attending Physician Name and Address:  Darlin Drop, DO  Relative Name and Phone Number:  [facility] (Daughter)  [phone]    Current Level of Care: Hospital Recommended Level of Care: Skilled Nursing Facility Prior Approval Number:    Date Approved/Denied:   PASRR Number: [id number]  Discharge Plan: SNF    Current Diagnoses: Patient Active Problem List   Diagnosis Date Noted   Left tibial fracture 07/29/2022   Dyspnea on exertion 09/15/2020   CVA (cerebral vascular accident) (HCC) 04/20/2019   Pain in left hip 10/05/2016   Pain in right hip 10/05/2016   Trochanteric bursitis, right hip 10/05/2016   Trochanteric bursitis, left hip 10/05/2016   Chronic insomnia 01/28/2011   Seasonal and perennial allergic rhinitis 05/28/2010   Lung nodule 05/28/2010   HYPERLIPIDEMIA 11/23/2009   G E R D 11/23/2009   EUSTACHIAN TUBE DYSFUNCTION 11/18/2009   HYPERTENSION 11/18/2009   Unspecified hypothyroidism 03/14/2007   Obstructive sleep apnea 03/14/2007   FIBROMYALGIA 03/14/2007    Orientation RESPIRATION BLADDER Height & Weight     Time, Situation, Place, Self  Normal Continent Weight: 173 lb 15.1 oz (78.9 kg) Height:  5\' 3"  (160 cm)  BEHAVIORAL SYMPTOMS/MOOD NEUROLOGICAL BOWEL NUTRITION STATUS      Continent Diet (see discharge summary)  AMBULATORY STATUS COMMUNICATION OF NEEDS Skin   Limited Assist Verbally  (incision closed, knee)                       Personal Care Assistance Level of Assistance  Bathing, Feeding, Dressing Bathing Assistance: Limited assistance Feeding assistance:  Independent Dressing Assistance: Limited assistance     Functional Limitations Info  Sight, Hearing, Speech Sight Info: Impaired (wears glasses) Hearing Info: Impaired (hard of hearing) Speech Info: Adequate    SPECIAL CARE FACTORS FREQUENCY  PT (By licensed PT), OT (By licensed OT)     PT Frequency: 5x/week OT Frequency: 5x/week            Contractures Contractures Info: Not present    Additional Factors Info  Code Status, Allergies Code Status Info: FULL Allergies Info: NKDA           Current Medications (08/02/2022):  This is the current hospital active medication list Current Facility-Administered Medications  Medication Dose Route Frequency Provider Last Rate Last Admin   0.9 %  sodium chloride infusion   Intravenous Continuous West Bali, PA-C   Stopped at 07/31/22 0135   acetaminophen (TYLENOL) tablet 1,000 mg  1,000 mg Oral Q6H McClung, Sarah A, PA-C   1,000 mg at 08/02/22 1325   amLODipine (NORVASC) tablet 5 mg  5 mg Oral Daily West Bali, PA-C   5 mg at 08/02/22 0272   apixaban (ELIQUIS) tablet 10 mg  10 mg Oral BID Rollene Fare, RPH       Followed by   Melene Muller ON 08/09/2022] apixaban (ELIQUIS) tablet 5 mg  5 mg Oral BID Rollene Fare, RPH       bisacodyl (DULCOLAX) suppository 10 mg  10 mg Rectal Daily PRN West Bali, PA-C   10 mg at 08/01/22 5366  docusate sodium (COLACE) capsule 100 mg  100 mg Oral BID West Bali, PA-C   100 mg at 08/01/22 4098   DULoxetine (CYMBALTA) DR capsule 30 mg  30 mg Oral Daily West Bali, PA-C   30 mg at 08/02/22 1191   hydrALAZINE (APRESOLINE) injection 5 mg  5 mg Intravenous Q4H PRN West Bali, PA-C       HYDROmorphone (DILAUDID) injection 0.5 mg  0.5 mg Intravenous Q4H PRN West Bali, PA-C   0.5 mg at 07/31/22 0954   insulin aspart (novoLOG) injection 0-15 Units  0-15 Units Subcutaneous TID WC West Bali, PA-C   8 Units at 08/02/22 1325   insulin aspart (novoLOG) injection  0-5 Units  0-5 Units Subcutaneous QHS West Bali, PA-C   3 Units at 07/30/22 2213   insulin glargine-yfgn (SEMGLEE) injection 30 Units  30 Units Subcutaneous Daily West Bali, PA-C   30 Units at 08/02/22 4782   irbesartan (AVAPRO) tablet 300 mg  300 mg Oral Daily West Bali, PA-C   300 mg at 08/02/22 1325   magnesium citrate solution 1 Bottle  1 Bottle Oral Once PRN West Bali, PA-C       methocarbamol (ROBAXIN) tablet 500 mg  500 mg Oral Q6H PRN West Bali, PA-C   500 mg at 07/31/22 9562   Or   methocarbamol (ROBAXIN) 500 mg in dextrose 5 % 50 mL IVPB  500 mg Intravenous Q6H PRN West Bali, PA-C       metoCLOPramide (REGLAN) tablet 5-10 mg  5-10 mg Oral Q8H PRN Sharon Seller, Sarah A, PA-C       Or   metoCLOPramide (REGLAN) injection 5-10 mg  5-10 mg Intravenous Q8H PRN West Bali, PA-C       metoprolol succinate (TOPROL-XL) 24 hr tablet 50 mg  50 mg Oral Daily Thyra Breed A, PA-C   50 mg at 08/02/22 0851   mupirocin ointment (BACTROBAN) 2 % 1 Application  1 Application Nasal BID West Bali, PA-C   1 Application at 08/02/22 0855   ondansetron (ZOFRAN) tablet 4 mg  4 mg Oral Q6H PRN West Bali, PA-C       Or   ondansetron (ZOFRAN) injection 4 mg  4 mg Intravenous Q6H PRN West Bali, PA-C   4 mg at 07/30/22 1308   oxyCODONE (Oxy IR/ROXICODONE) immediate release tablet 5 mg  5 mg Oral Q4H PRN West Bali, PA-C   5 mg at 08/02/22 1458   pantoprazole (PROTONIX) EC tablet 40 mg  40 mg Oral Daily West Bali, PA-C   40 mg at 08/02/22 6578   polyethylene glycol (MIRALAX / GLYCOLAX) packet 17 g  17 g Oral Daily PRN West Bali, PA-C       rosuvastatin (CRESTOR) tablet 20 mg  20 mg Oral QHS Thyra Breed A, PA-C   20 mg at 08/01/22 2238   senna-docusate (Senokot-S) tablet 1 tablet  1 tablet Oral QHS PRN West Bali, PA-C       traZODone (DESYREL) tablet 50 mg  50 mg Oral QHS PRN West Bali, PA-C         Discharge  Medications: Please see discharge summary for a list of discharge medications.  Relevant Imaging Results:  Relevant Lab Results:   Additional Information SSN:[id number]  Kazuko Clemence A [place], [place]

## 2022-08-02 NOTE — Progress Notes (Signed)
Left lower ext venous  has been completed. Refer to [facility] under chart review to view preliminary results.   08/02/2022  2:51 PM Corneilus Heggie, Gerarda Gunther

## 2022-08-02 NOTE — Progress Notes (Addendum)
PROGRESS NOTE    [name]  WUJ:[id number] DOB: 02-01-1946 DOA: 07/29/2022 PCP: Geoffry Paradise, MD   Brief Narrative:  [name]  is a 77 y.o. female, with past medical history of CVA, hypertension, hyperlipidemia, GERD, OSA, patient presents to ED secondary to fall, she was brought via EMS, for left knee and ankle pain.  She was standing on the bed, trying to go fly on the ceiling, she fell off on the bed, and onto the ground, she denies any head trauma, loss of consciousness, she could not get off the ground secondary to pain, mainly in the left ankle area.  - in ED workup revealed proximal tibial fracture.  Status post repair on 07/30/2022.  08/02/2022: The patient was seen and examined at bedside.  Complains of pain and swelling in her left lower extremity.  Doppler ultrasound ordered to rule out a DVT.  Addendum, left lower extremity Doppler ultrasound positive for acute DVT.  Assessment & Plan:   Left fibular fracture status post repair on 07/30/2022: -Secondary to mechanical fall, she sustained Comminuted fracture of the both medial and lateral tibial plateau predominantly involving the medial tibial plateau (Schatzker type 5) -Status post open reduction internal fixation of left bicondylar tibial plateau fracture on 07/30/2022. Continue as needed analgesics.  Aspirin 325 mg daily for DVT prophylaxis at discharge. -Follow-up with Ortho in 2 weeks after the discharge for wound check and repeat x-rays. -PT evaluation recommended CIR.  Left lower extremity edema and pain Acute left lower extremity DVT Postsurgery day #3 Rule out DVT, follow Doppler ultrasound, positive. Started Eliquis    Hypertension -Blood pressure is stable.  Continue home medication-amlodipine, irbesartan metoprolol.  Continue hydralazine as needed Continue to closely monitor vital signs..   History of CVA -Continue secondary CVA prevention with aspirin, statin   Diabetes mellitus, type II  with hyperglycemia Continue Semglee coverage and insulin sliding scale.   GERD Stable Continue PPI   OSA -Continue with CPAP  Hyperlipidemia: Continue rosuvastatin  Normocytic anemia: Continue to monitor H&H  Resolved hyperkalemia: Continue to monitor electrolytes  Leukocytosis: Improving.  Likely reactive.  Will continue to monitor.  She is afebrile.  WBC 11.8.  DVT prophylaxis: Subcu Lovenox daily Code Status: Full code Family Communication: None at bedside. Disposition Plan: Possible CIR placement  Consultants:  Ortho  Procedures:  None  Antimicrobials:  None  Status is: Inpatient     Objective: Vitals:   08/01/22 2020 08/01/22 2056 08/02/22 0422 08/02/22 0817  BP: (!) 125/58  118/63 (!) 126/58  Pulse: 72  81 76  Resp: 16  16 18   Temp: 98.6 F (37 C)  97.9 F (36.6 C) 98.2 F (36.8 C)  TempSrc: Oral   Oral  SpO2: 99% 99% 100% 99%  Weight:      Height:       No intake or output data in the 24 hours ending 08/02/22 1615  Filed Weights   07/29/22 1409 07/30/22 1138  Weight: 78.9 kg 78.9 kg    Examination:  General exam: Well-developed well-nourished in no acute distress.  Pleasant Respiratory system: Clear to auscultation. Cardiovascular system: Regular rate and rhythm no rubs or gallops. Gastrointestinal system: Abdomen is nondistended, soft and nontender. No organomegaly or masses felt. Normal bowel sounds heard. Central nervous system: Alert and oriented. No focal neurological deficits. Extremities: Left lower extremity with edema nearly doubled the size of the right lower extremity. Psychiatry: Judgement and insight appear normal. Mood & affect appropriate.    Data  Reviewed: I have personally reviewed following labs and imaging studies  CBC: Recent Labs  Lab 07/30/22 0641 07/30/22 1623 07/31/22 0413 08/01/22 0500 08/02/22 0435  WBC 11.1* 13.7* 11.0* 11.8* 10.3  HGB 11.3* 10.9* 10.3* 9.8* 9.4*  HCT 35.1* 33.5* 31.0* 29.9* 28.9*   MCV 83.8 80.7 80.3 83.1 82.6  PLT 212 219 209 209 228   Basic Metabolic Panel: Recent Labs  Lab 07/29/22 1449 07/30/22 0641 07/30/22 1623 07/31/22 0407 07/31/22 0413 08/01/22 0500 08/02/22 0435  NA 135 137  --   --  134* 137 134*  K 3.7 4.5  --   --  5.2* 4.7 4.2  CL 103 104  --   --  100 105 106  CO2 21* 24  --   --  24 25 23   GLUCOSE 188* 140*  --   --  242* 158* 123*  BUN 17 16  --   --  14 15 10   CREATININE 0.70 0.82 0.90  --  0.88 0.88 0.66  CALCIUM 8.9 9.0  --   --  9.0 8.9 8.3*  MG  --   --   --  1.9  --   --  2.0   GFR: Estimated Creatinine Clearance: 60.4 mL/min (by C-G formula based on SCr of 0.66 mg/dL). Liver Function Tests: No results for input(s): "AST", "ALT", "ALKPHOS", "BILITOT", "PROT", "ALBUMIN" in the last 168 hours. No results for input(s): "LIPASE", "AMYLASE" in the last 168 hours. No results for input(s): "AMMONIA" in the last 168 hours. Coagulation Profile: No results for input(s): "INR", "PROTIME" in the last 168 hours. Cardiac Enzymes: No results for input(s): "CKTOTAL", "CKMB", "CKMBINDEX", "TROPONINI" in the last 168 hours. BNP (last 3 results) No results for input(s): "PROBNP" in the last 8760 hours. HbA1C: No results for input(s): "HGBA1C" in the last 72 hours. CBG: Recent Labs  Lab 08/01/22 1638 08/01/22 2018 08/02/22 0038 08/02/22 0819 08/02/22 1223  GLUCAP 152* 117* 120* 143* 235*   Lipid Profile: No results for input(s): "CHOL", "HDL", "LDLCALC", "TRIG", "CHOLHDL", "LDLDIRECT" in the last 72 hours. Thyroid Function Tests: No results for input(s): "TSH", "T4TOTAL", "FREET4", "T3FREE", "THYROIDAB" in the last 72 hours. Anemia Panel: No results for input(s): "VITAMINB12", "FOLATE", "FERRITIN", "TIBC", "IRON", "RETICCTPCT" in the last 72 hours. Sepsis Labs: No results for input(s): "PROCALCITON", "LATICACIDVEN" in the last 168 hours.  Recent Results (from the past 240 hour(s))  Surgical PCR screen     Status: Abnormal    Collection Time: 07/30/22  1:31 AM   Specimen: Nasal Mucosa; Nasal Swab  Result Value Ref Range Status   MRSA, PCR POSITIVE (A) NEGATIVE Final    Comment: RESULT CALLED TO, READ BACK BY AND VERIFIED WITH: R ALLEN,RN@0620  07/30/22 MK    Staphylococcus aureus POSITIVE (A) NEGATIVE Final    Comment: (NOTE) The Xpert SA Assay (FDA approved for NASAL specimens in patients 67 years of age and older), is one component of a comprehensive surveillance program. It is not intended to diagnose infection nor to guide or monitor treatment. Performed at [facility], [address], [place], [place] [address]       Radiology Studies: VAS [place] LOWER EXTREMITY VENOUS (DVT)  Result Date: 08/02/2022  Lower Venous DVT Study Patient Name:  KESHEA CAMAJ Rorke  Date of Exam:   08/02/2022 Medical Rec #: [id number]          Accession #:    [id number] Date of Birth: 1945-08-29  Patient Gender: F Patient Age:   47 years Exam Location:  [facility] Procedure:      VAS [place] LOWER EXTREMITY VENOUS (DVT) Referring Phys: Ohanna Gassert --------------------------------------------------------------------------------  Indications: Swelling, and Pain. Other Indications: OPEN REDUCTION INTERNAL FIXATION STATUS POST LEFT (ORIF)                    TIBIAL PLATEAU SURGERY ON 07/30/22. Performing Technologist: Marilynne Halsted RDMS, RVT  Examination Guidelines: A complete evaluation includes B-mode imaging, spectral Doppler, color Doppler, and power Doppler as needed of all accessible portions of each vessel. Bilateral testing is considered an integral part of a complete examination. Limited examinations for reoccurring indications may be performed as noted. The reflux portion of the exam is performed with the patient in reverse Trendelenburg.  +-----+---------------+---------+-----------+----------+--------------+ RIGHTCompressibilityPhasicitySpontaneityPropertiesThrombus Aging  +-----+---------------+---------+-----------+----------+--------------+ CFV  Full           Yes      Yes                                 +-----+---------------+---------+-----------+----------+--------------+ SFJ  Full                                                        +-----+---------------+---------+-----------+----------+--------------+   +---------+---------------+---------+-----------+----------+--------------+ LEFT     CompressibilityPhasicitySpontaneityPropertiesThrombus Aging +---------+---------------+---------+-----------+----------+--------------+ CFV      Full           Yes      Yes                                 +---------+---------------+---------+-----------+----------+--------------+ SFJ      Full                                                        +---------+---------------+---------+-----------+----------+--------------+ FV Prox  Full                                                        +---------+---------------+---------+-----------+----------+--------------+ FV Mid   Full                                                        +---------+---------------+---------+-----------+----------+--------------+ FV DistalFull                                                        +---------+---------------+---------+-----------+----------+--------------+ PFV      Full                                                        +---------+---------------+---------+-----------+----------+--------------+  POP      Full           Yes      Yes                                 +---------+---------------+---------+-----------+----------+--------------+ PTV      Full                                                        +---------+---------------+---------+-----------+----------+--------------+ PERO     None           No       No                   Acute           +---------+---------------+---------+-----------+----------+--------------+     Summary: LEFT: - Findings consistent with acute deep vein thrombosis involving the left peroneal veins. - No cystic structure found in the popliteal fossa.  *See table(s) above for measurements and observations. Electronically signed by Sherald Hess MD on 08/02/2022 at 3:03:38 PM.    Final     Scheduled Meds:  acetaminophen  1,000 mg Oral Q6H   amLODipine  5 mg Oral Daily   aspirin  81 mg Oral Daily   docusate sodium  100 mg Oral BID   DULoxetine  30 mg Oral Daily   enoxaparin (LOVENOX) injection  40 mg Subcutaneous Q24H   insulin aspart  0-15 Units Subcutaneous TID WC   insulin aspart  0-5 Units Subcutaneous QHS   insulin glargine-yfgn  30 Units Subcutaneous Daily   irbesartan  300 mg Oral Daily   metoprolol succinate  50 mg Oral Daily   mupirocin ointment  1 Application Nasal BID   pantoprazole  40 mg Oral Daily   rosuvastatin  20 mg Oral QHS   Continuous Infusions:  sodium chloride Stopped (07/31/22 0135)   methocarbamol (ROBAXIN) IV       LOS: 4 days   Time spent: 35 minutes   Darlin Drop, MD [facility]  If 7PM-7AM, please contact night-coverage [email or web address] 08/02/2022, 4:15 PM

## 2022-08-03 ENCOUNTER — Telehealth (HOSPITAL_COMMUNITY): Payer: Self-pay | Admitting: Pharmacy Technician

## 2022-08-03 ENCOUNTER — Other Ambulatory Visit (HOSPITAL_COMMUNITY): Payer: Self-pay

## 2022-08-03 DIAGNOSIS — S82192A Other fracture of upper end of left tibia, initial encounter for closed fracture: Secondary | ICD-10-CM | POA: Diagnosis not present

## 2022-08-03 LAB — GLUCOSE, CAPILLARY
Glucose-Capillary: 182 mg/dL — ABNORMAL HIGH (ref 70–99)
Glucose-Capillary: 159 mg/dL — ABNORMAL HIGH (ref 70–99)
Glucose-Capillary: 164 mg/dL — ABNORMAL HIGH (ref 70–99)
Glucose-Capillary: 134 mg/dL — ABNORMAL HIGH (ref 70–99)

## 2022-08-03 NOTE — Progress Notes (Signed)
PROGRESS NOTE    [name]  WUJ:[id number] DOB: 07-23-1945 DOA: 07/29/2022 PCP: Geoffry Paradise, MD   Brief Narrative:  [name]  is a 77 y.o. female, with past medical history of CVA, hypertension, hyperlipidemia, GERD, OSA, patient presents to ED secondary to fall, she was brought via EMS, for left knee and ankle pain.  She was standing on the bed, trying to go fly on the ceiling, she fell off on the bed, and onto the ground, she denies any head trauma, loss of consciousness, she could not get off the ground secondary to pain, mainly in the left ankle area.  - in ED workup revealed proximal tibial fracture.  Status post repair on 07/30/2022.  08/02/2022: The patient was seen and examined at bedside.  Complains of pain and swelling in her left lower extremity.  Doppler ultrasound ordered to rule out a DVT.  Addendum, left lower extremity Doppler ultrasound positive for acute DVT.  08/03/2022: Patient was seen and examined at her bedside.  She feels too weak to go home today.  Assessment & Plan:   Left fibular fracture status post repair on 07/30/2022: -Secondary to mechanical fall, she sustained Comminuted fracture of the both medial and lateral tibial plateau predominantly involving the medial tibial plateau (Schatzker type 5) -Status post open reduction internal fixation of left bicondylar tibial plateau fracture on 07/30/2022. Continue as needed analgesics.  Aspirin 325 mg daily for DVT prophylaxis at discharge. -Follow-up with Ortho in 2 weeks after the discharge for wound check and repeat x-rays. Continue PT OT with assistance and fall precautions.  Left lower extremity edema and pain Acute left lower extremity DVT Postsurgery day #4 Continue Eliquis started on 08/02/2022.    Hypertension -Blood pressure is stable.  Continue home medication-amlodipine, irbesartan metoprolol.  Continue hydralazine as needed Continue to closely monitor vital signs   History of  CVA -Continue secondary CVA prevention with aspirin, statin   Diabetes mellitus, type II with hyperglycemia Continue Semglee coverage and insulin sliding scale.   GERD Stable Continue PPI   OSA -Continue with CPAP  Hyperlipidemia: Continue rosuvastatin  Normocytic anemia: Continue to monitor H&H  Resolved hyperkalemia: Continue to monitor electrolytes  Leukocytosis: Improving.  Likely reactive.  Will continue to monitor.  She is afebrile.  WBC 11.8.  DVT prophylaxis: Subcu Lovenox daily Code Status: Full code Family Communication: None at bedside. Disposition Plan: Possible CIR placement  Consultants:  Ortho  Procedures:  None  Antimicrobials:  None  Status is: Inpatient     Objective: Vitals:   08/02/22 2139 08/03/22 0538 08/03/22 0735 08/03/22 1624  BP: 131/73 (!) 142/70 (!) 141/62 125/62  Pulse: 84 88 86 72  Resp: 18 18 18 18   Temp: 98.2 F (36.8 C) 98.2 F (36.8 C) 98.7 F (37.1 C) 98.6 F (37 C)  TempSrc: Oral Oral Oral Oral  SpO2: 98% 97% 96% 98%  Weight:      Height:       No intake or output data in the 24 hours ending 08/03/22 1712  Filed Weights   07/29/22 1409 07/30/22 1138  Weight: 78.9 kg 78.9 kg    Examination:  General exam: Well-developed and nourished in no acute distress. Respiratory system: Clear to auscultation. Cardiovascular system:RRR no rubs or gallops Gastrointestinal system: Abdomen is nondistended, soft and nontender. No organomegaly or masses felt. Normal bowel sounds heard. Central nervous system: Alert and oriented. No focal neurological deficits. Extremities: Left lower extremity with edema nearly doubled the size of the  right lower extremity. Psychiatry: Mood is appropriate for condition setting.   Data Reviewed: I have personally reviewed following labs and imaging studies  CBC: Recent Labs  Lab 07/30/22 0641 07/30/22 1623 07/31/22 0413 08/01/22 0500 08/02/22 0435  WBC 11.1* 13.7* 11.0* 11.8* 10.3   HGB 11.3* 10.9* 10.3* 9.8* 9.4*  HCT 35.1* 33.5* 31.0* 29.9* 28.9*  MCV 83.8 80.7 80.3 83.1 82.6  PLT 212 219 209 209 228   Basic Metabolic Panel: Recent Labs  Lab 07/29/22 1449 07/30/22 0641 07/30/22 1623 07/31/22 0407 07/31/22 0413 08/01/22 0500 08/02/22 0435  NA 135 137  --   --  134* 137 134*  K 3.7 4.5  --   --  5.2* 4.7 4.2  CL 103 104  --   --  100 105 106  CO2 21* 24  --   --  24 25 23   GLUCOSE 188* 140*  --   --  242* 158* 123*  BUN 17 16  --   --  14 15 10   CREATININE 0.70 0.82 0.90  --  0.88 0.88 0.66  CALCIUM 8.9 9.0  --   --  9.0 8.9 8.3*  MG  --   --   --  1.9  --   --  2.0   GFR: Estimated Creatinine Clearance: 60.4 mL/min (by C-G formula based on SCr of 0.66 mg/dL). Liver Function Tests: No results for input(s): "AST", "ALT", "ALKPHOS", "BILITOT", "PROT", "ALBUMIN" in the last 168 hours. No results for input(s): "LIPASE", "AMYLASE" in the last 168 hours. No results for input(s): "AMMONIA" in the last 168 hours. Coagulation Profile: No results for input(s): "INR", "PROTIME" in the last 168 hours. Cardiac Enzymes: No results for input(s): "CKTOTAL", "CKMB", "CKMBINDEX", "TROPONINI" in the last 168 hours. BNP (last 3 results) No results for input(s): "PROBNP" in the last 8760 hours. HbA1C: No results for input(s): "HGBA1C" in the last 72 hours. CBG: Recent Labs  Lab 08/02/22 1711 08/02/22 2143 08/03/22 0736 08/03/22 1156 08/03/22 1657  GLUCAP 88 139* 182* 159* 164*   Lipid Profile: No results for input(s): "CHOL", "HDL", "LDLCALC", "TRIG", "CHOLHDL", "LDLDIRECT" in the last 72 hours. Thyroid Function Tests: No results for input(s): "TSH", "T4TOTAL", "FREET4", "T3FREE", "THYROIDAB" in the last 72 hours. Anemia Panel: No results for input(s): "VITAMINB12", "FOLATE", "FERRITIN", "TIBC", "IRON", "RETICCTPCT" in the last 72 hours. Sepsis Labs: No results for input(s): "PROCALCITON", "LATICACIDVEN" in the last 168 hours.  Recent Results (from the  past 240 hour(s))  Surgical PCR screen     Status: Abnormal   Collection Time: 07/30/22  1:31 AM   Specimen: Nasal Mucosa; Nasal Swab  Result Value Ref Range Status   MRSA, PCR POSITIVE (A) NEGATIVE Final    Comment: RESULT CALLED TO, READ BACK BY AND VERIFIED WITH: R ALLEN,RN@0620  07/30/22 MK    Staphylococcus aureus POSITIVE (A) NEGATIVE Final    Comment: (NOTE) The Xpert SA Assay (FDA approved for NASAL specimens in patients 43 years of age and older), is one component of a comprehensive surveillance program. It is not intended to diagnose infection nor to guide or monitor treatment. Performed at [facility], [address] [place], [place] [address]       Radiology Studies: VAS [place] LOWER EXTREMITY VENOUS (DVT)  Result Date: 08/02/2022  Lower Venous DVT Study Patient Name:  [name]  Date of Exam:   08/02/2022 Medical Rec #: [id number]          Accession #:  [id number] Date of Birth: [birth year]         Patient Gender: F Patient Age:   44 years Exam Location:  [facility] [place] Procedure:      VAS [place] LOWER EXTREMITY VENOUS (DVT) Referring Phys: Orvil Faraone --------------------------------------------------------------------------------  Indications: Swelling, and Pain. Other Indications: OPEN REDUCTION INTERNAL FIXATION STATUS POST LEFT (ORIF)                    TIBIAL PLATEAU SURGERY ON 07/30/22. Performing Technologist: Marilynne Halsted RDMS, RVT  Examination Guidelines: A complete evaluation includes B-mode imaging, spectral Doppler, color Doppler, and power Doppler as needed of all accessible portions of each vessel. Bilateral testing is considered an integral part of a complete examination. Limited examinations for reoccurring indications may be performed as noted. The reflux portion of the exam is performed with the patient in reverse Trendelenburg.  +-----+---------------+---------+-----------+----------+--------------+  RIGHTCompressibilityPhasicitySpontaneityPropertiesThrombus Aging +-----+---------------+---------+-----------+----------+--------------+ CFV  Full           Yes      Yes                                 +-----+---------------+---------+-----------+----------+--------------+ SFJ  Full                                                        +-----+---------------+---------+-----------+----------+--------------+   +---------+---------------+---------+-----------+----------+--------------+ LEFT     CompressibilityPhasicitySpontaneityPropertiesThrombus Aging +---------+---------------+---------+-----------+----------+--------------+ CFV      Full           Yes      Yes                                 +---------+---------------+---------+-----------+----------+--------------+ SFJ      Full                                                        +---------+---------------+---------+-----------+----------+--------------+ FV Prox  Full                                                        +---------+---------------+---------+-----------+----------+--------------+ FV Mid   Full                                                        +---------+---------------+---------+-----------+----------+--------------+ FV DistalFull                                                        +---------+---------------+---------+-----------+----------+--------------+ PFV      Full                                                        +---------+---------------+---------+-----------+----------+--------------+  POP      Full           Yes      Yes                                 +---------+---------------+---------+-----------+----------+--------------+ PTV      Full                                                        +---------+---------------+---------+-----------+----------+--------------+ PERO     None           No       No                   Acute           +---------+---------------+---------+-----------+----------+--------------+     Summary: LEFT: - Findings consistent with acute deep vein thrombosis involving the left peroneal veins. - No cystic structure found in the popliteal fossa.  *See table(s) above for measurements and observations. Electronically signed by Sherald Hess MD on 08/02/2022 at 3:03:38 PM.    Final     Scheduled Meds:  acetaminophen  1,000 mg Oral Q6H   amLODipine  5 mg Oral Daily   apixaban  10 mg Oral BID   Followed by   Melene Muller ON 08/09/2022] apixaban  5 mg Oral BID   docusate sodium  100 mg Oral BID   DULoxetine  30 mg Oral Daily   insulin aspart  0-15 Units Subcutaneous TID WC   insulin aspart  0-5 Units Subcutaneous QHS   insulin glargine-yfgn  30 Units Subcutaneous Daily   irbesartan  300 mg Oral Daily   metoprolol succinate  50 mg Oral Daily   pantoprazole  40 mg Oral Daily   rosuvastatin  20 mg Oral QHS   Continuous Infusions:  sodium chloride Stopped (07/31/22 0135)   methocarbamol (ROBAXIN) IV       LOS: 5 days   Time spent: 35 minutes   Darlin Drop, MD [facility]  If 7PM-7AM, please contact night-coverage [email or web address] 08/03/2022, 5:12 PM

## 2022-08-03 NOTE — TOC Progression Note (Addendum)
Transition of Care [facility]) - Progression Note    Patient Details  Name: [name] MRN: [id number] Date of Birth: February 23, 1946  Transition of [facility] CM/SW Contact  Abbye Lao A [place], [place] Phone Number: 08/03/2022, 1:46 PM  Clinical Narrative:    CSW contacted pt's daughter, Whitney Muse, regarding skilled nursing facilities. She said she would be at pt's bedside this afternoon and make decision regarding facilities with pt's bed offers. CSW will follow up after decision and proceed with next steps towards discharge after SNF selection today.   DC pending SNF selection and  insurance authorization.    Expected Discharge Plan: Skilled Nursing Facility Barriers to Discharge: SNF Pending bed offer  Expected Discharge Plan and Services In-house Referral: Clinical Social Work     Living arrangements for the past 2 months: Single Family Home                                       Social Determinants of Health (SDOH) Interventions SDOH Screenings   Food Insecurity: No Food Insecurity (07/30/2022)  Housing: Low Risk  (07/30/2022)  Transportation Needs: No Transportation Needs (07/30/2022)  Utilities: Not At Risk (07/30/2022)  Tobacco Use: Low Risk  (07/31/2022)    Readmission Risk Interventions     No data to display

## 2022-08-03 NOTE — TOC Benefit Eligibility Note (Signed)
Patient Product/process development scientist completed.    The patient is currently admitted and upon discharge could be taking Eliquis 5 mg.  The current 30 day co-pay is $139.76 due to being in Coverage Gap (donut hole).   The patient is insured through [employer] Part D   This test claim was processed through [facility]- copay amounts may vary at other pharmacies due to pharmacy/plan contracts, or as the patient moves through the different stages of their insurance plan.  Roland Earl, CPHT Pharmacy Patient Advocate Specialist [facility] Patient Advocate Team Direct Number: [phone]  Fax: [phone]

## 2022-08-03 NOTE — Progress Notes (Signed)
Occupational Therapy Treatment Patient Details Name: [name] MRN: [id number] DOB: 06/24/1945 Today's Date: 08/03/2022   History of present illness 77 y/o F admitted to [facility] on 5/9 with L knee pain after fall from standing on the bed. Imaging revealed L comminuted tibial plateau fracture. S/p ORIF 5/10. PMHx: DM, HTN, CVA, HLD, GERD, OSA   OT comments  Pt is making good progress towards their acute OT goals. Pt was in the shower upon arrival (cleared for showers 5/13 with brace doffed in sitting). Overall she continues to need mod-max A for LB ADLs and is mod I for UB ADLs in sitting. Pt is limited by poor activity tolerance and extreme fatigue, she required extensive rest breaks during ADLs. 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 For those reasons, pt believe she will need a short rehab stay for safety. OT to continue to follow acutely to facilitate progress towards established goals.     Recommendations for follow up therapy are one component of a multi-disciplinary discharge planning process, led by the attending physician.  Recommendations may be updated based on patient status, additional functional criteria and insurance authorization.    Assistance Recommended at Discharge Frequent or constant Supervision/Assistance  Patient can return home with the following  A lot of help with bathing/dressing/bathroom;Two people to help with walking and/or transfers;Help with stairs or ramp for entrance   Equipment Recommendations  BSC/3in1;Wheelchair (measurements OT)       Precautions / Restrictions Precautions Precautions: Fall Required Braces or Orthoses: Other Brace Other Brace: L Bledsoe brace when OOB Restrictions Weight Bearing Restrictions: Yes LLE Weight Bearing: Non weight bearing Other Position/Activity Restrictions: ROM as tolerated to L knee       Mobility Bed  Mobility Overal bed mobility: Needs Assistance Bed Mobility: Sit to Supine     Supine to sit: Min assist     General bed mobility comments: min A for LLE    Transfers Overall transfer level: Needs assistance Equipment used: Rolling walker (2 wheels) Transfers: Sit to/from Stand Sit to Stand: Min assist                 Balance Overall balance assessment: Needs assistance Sitting-balance support: No upper extremity supported Sitting balance-Leahy Scale: Fair     Standing balance support: Bilateral upper extremity supported, During functional activity Standing balance-Leahy Scale: Poor                             ADL either performed or assessed with clinical judgement   ADL Overall ADL's : Needs assistance/impaired     Grooming: Modified independent;Sitting Grooming Details (indicate cue type and reason): sitting at the sink             Lower Body Dressing: Maximal assistance;Sit to/from stand Lower Body Dressing Details (indicate cue type and reason): socks dependently donned - anticipate pt would do very well with AE Toilet Transfer: Minimal assistance;Ambulation;Rolling walker (2 wheels)           Functional mobility during ADLs: Minimal assistance;Rolling walker (2 wheels);Cueing for safety General ADL Comments: pt bahting while sitting on shower chair upon arrival, reviewed LB bathing while maintaining NWB. min A throughout for balance and safely. Pt requires several extensive rest breaks during ADLs    Extremity/Trunk Assessment Upper Extremity Assessment Upper Extremity Assessment: Overall  WFL for tasks assessed   Lower Extremity Assessment Lower Extremity Assessment: Defer to PT evaluation        Vision   Vision Assessment?: No apparent visual deficits   Perception Perception Perception: Within Functional Limits   Praxis Praxis Praxis: Intact    Cognition Arousal/Alertness: Awake/alert Behavior During Therapy: WFL for  tasks assessed/performed Overall Cognitive Status: Within Functional Limits for tasks assessed             General Comments: overall WFL - eager to go home but understands she will likely need SNF due to poor access adn assist at home              General Comments Pt extremely fatigued and a little diaphoretic after shower, BP stable.    Pertinent Vitals/ Pain       Pain Assessment Pain Assessment: Faces Faces Pain Scale: Hurts little more Pain Location: LLE Pain Descriptors / Indicators: Aching, Discomfort, Grimacing Pain Intervention(s): Limited activity within patient's tolerance, Monitored during session   Frequency  Min 2X/week        Progress Toward Goals  OT Goals(current goals can now be found in the care plan section)  Progress towards OT goals: Progressing toward goals  Acute Rehab OT Goals Patient Stated Goal: to go home OT Goal Formulation: With patient Time For Goal Achievement: 08/14/22 Potential to Achieve Goals: Good ADL Goals Pt Will Perform Grooming: with set-up;sitting Pt Will Perform Upper Body Bathing: with supervision;with set-up;sitting Pt Will Perform Lower Body Bathing: with mod assist;with min assist;with adaptive equipment Pt Will Perform Upper Body Dressing: with supervision;with set-up;sitting Pt Will Perform Lower Body Dressing: with max assist;with mod assist;with adaptive equipment;with caregiver independent in assisting Pt Will Transfer to Toilet: with mod assist;with min assist;bedside commode Pt Will Perform Toileting - Clothing Manipulation and hygiene: with mod assist;with min assist;sitting/lateral leans;sit to/from stand  Plan Discharge plan needs to be updated       AM-PAC OT "6 Clicks" Daily Activity     Outcome Measure   Help from another person eating meals?: None Help from another person taking care of personal grooming?: None Help from another person toileting, which includes using toliet, bedpan, or urinal?: A  Little Help from another person bathing (including washing, rinsing, drying)?: A Lot Help from another person to put on and taking off regular upper body clothing?: A Little Help from another person to put on and taking off regular lower body clothing?: A Lot 6 Click Score: 18    End of Session Equipment Utilized During Treatment: Rolling walker (2 wheels)  OT Visit Diagnosis: Unsteadiness on feet (R26.81);Other abnormalities of gait and mobility (R26.89);Muscle weakness (generalized) (M62.81);Pain Pain - Right/Left: Left Pain - part of body: Leg   Activity Tolerance Patient tolerated treatment well   Patient Left in bed;with call bell/phone within reach;with bed alarm set   Nurse Communication Mobility status        Time: 1610-9604 OT Time Calculation (min): 24 min  Charges: OT General Charges $OT Visit: 1 Visit OT Treatments $Self Care/Home Management : 8-22 mins  Derenda Mis, OTR/L Acute Rehabilitation Services Office [phone] Secure Chat Communication Preferred   Donia Pounds 08/03/2022, 10:12 AM

## 2022-08-03 NOTE — Progress Notes (Signed)
Physical Therapy Treatment Patient Details Name: [name] MRN: [id number] DOB: [birth year] Today's Date: 08/03/2022   History of Present Illness 77 y/o F admitted to [facility] on 5/9 with L knee pain after fall from standing on the bed. Imaging revealed L comminuted tibial plateau fracture. S/p ORIF 5/10. PMHx: DM, HTN, CVA, HLD, GERD, OSA    PT Comments    Session focused on trying to progress ambulation and on improving L knee ROM through supine and seated exercises. Pt continues to fatigue quickly when hopping to ambulate while maintaining her L lower extremity NWB precautions, even though cued to try use arms more and then arms less when her arms fatigued. She is only able to ambulate up to ~15 ft at a time. Navigating the flight of stairs into her home would be very difficulty considering this, and pt is aware and more receptive to inpatient rehab now. Will continue to follow acutely.     Recommendations for follow up therapy are one component of a multi-disciplinary discharge planning process, led by the attending physician.  Recommendations may be updated based on patient status, additional functional criteria and insurance authorization.  Follow Up Recommendations  Can patient physically be transported by private vehicle: Yes    Assistance Recommended at Discharge Frequent or constant Supervision/Assistance  Patient can return home with the following Assistance with cooking/housework;Assist for transportation;Help with stairs or ramp for entrance;A lot of help with walking and/or transfers;A lot of help with bathing/dressing/bathroom   Equipment Recommendations  Wheelchair (measurements PT);Wheelchair cushion (measurements PT)    Recommendations for Other Services       Precautions / Restrictions Precautions Precautions: Fall Required Braces or Orthoses: Other Brace Other Brace: L Bledsoe brace when OOB Restrictions Weight Bearing Restrictions: Yes LLE Weight Bearing: Non  weight bearing Other Position/Activity Restrictions: ROM as tolerated to L knee     Mobility  Bed Mobility Overal bed mobility: Needs Assistance Bed Mobility: Supine to Sit     Supine to sit: Min assist, HOB elevated     General bed mobility comments: MinA to manage L leg initially off EOB, slow due to pain    Transfers Overall transfer level: Needs assistance Equipment used: Rolling walker (2 wheels) Transfers: Sit to/from Stand Sit to Stand: Min assist, Min guard           General transfer comment: MinA to power up to stand first rep from EOB to RW, min guard from chair, and minA from commode, cuing pt to kick L leg anteriorly to maintain precautions.    Ambulation/Gait Ambulation/Gait assistance: Min assist, Min guard Gait Distance (Feet): 15 Feet (x3 bouts of ~9 ft > ~15 ft > ~10 ft) Assistive device: Rolling walker (2 wheels) Gait Pattern/deviations: Decreased step length - right, Trunk flexed Gait velocity: decreased Gait velocity interpretation: <1.31 ft/sec, indicative of household ambulator   General Gait Details: hopping gait pattern, minguard-minA for balance, pt fatiguing easily requiring seated or standing rest breaks with room ambulation. Needed a standing rest break every 3-5 hops. Cueing throughout for maintaining LLE off the ground, good management throughout.   Stairs             Wheelchair Mobility    Modified Rankin (Stroke Patients Only)       Balance Overall balance assessment: Needs assistance Sitting-balance support: Feet supported, No upper extremity supported Sitting balance-Leahy Scale: Fair     Standing balance support: Bilateral upper extremity supported, Reliant on assistive device for balance, During functional activity  Standing balance-Leahy Scale: Poor Standing balance comment: reliant on RW                            Cognition Arousal/Alertness: Awake/alert Behavior During Therapy: WFL for tasks  assessed/performed Overall Cognitive Status: Within Functional Limits for tasks assessed                                          Exercises General Exercises - Lower Extremity Quad Sets: AAROM, Left, 5 reps, Supine Long Arc Quad: AAROM, Left, 10 reps, Seated Heel Slides: AAROM, Left, 10 reps, 5 reps, Supine, Seated (x5 supine, x10 seated) Straight Leg Raises: AAROM, Left, 10 reps, Supine    General Comments        Pertinent Vitals/Pain Pain Assessment Pain Assessment: Faces Faces Pain Scale: Hurts even more Pain Location: L knee and leg, R hip Pain Descriptors / Indicators: Aching, Discomfort, Grimacing Pain Intervention(s): Limited activity within patient's tolerance, Monitored during session, Repositioned    Home Living                          Prior Function            PT Goals (current goals can now be found in the care plan section) Acute Rehab PT Goals Patient Stated Goal: go home PT Goal Formulation: With patient/family Time For Goal Achievement: 08/14/22 Potential to Achieve Goals: Good Progress towards PT goals: Progressing toward goals    Frequency    Min 4X/week      PT Plan Discharge plan needs to be updated    Co-evaluation              AM-PAC PT "6 Clicks" Mobility   Outcome Measure  Help needed turning from your back to your side while in a flat bed without using bedrails?: A Little Help needed moving from lying on your back to sitting on the side of a flat bed without using bedrails?: A Little Help needed moving to and from a bed to a chair (including a wheelchair)?: A Little Help needed standing up from a chair using your arms (e.g., wheelchair or bedside chair)?: A Little Help needed to walk in hospital room?: Total (<20 ft) Help needed climbing 3-5 steps with a railing? : Total 6 Click Score: 14    End of Session Equipment Utilized During Treatment: Gait belt Activity Tolerance: Patient limited by  fatigue;Patient limited by pain Patient left: in chair;with call bell/phone within reach;with family/visitor present   PT Visit Diagnosis: Muscle weakness (generalized) (M62.81);History of falling (Z91.81);Difficulty in walking, not elsewhere classified (R26.2);Other abnormalities of gait and mobility (R26.89);Pain;Unsteadiness on feet (R26.81) Pain - Right/Left: Left Pain - part of body: Hip     Time: 9811-9147 PT Time Calculation (min) (ACUTE ONLY): 27 min  Charges:  $Gait Training: 8-22 mins $Therapeutic Exercise: 8-22 mins                     Raymond Gurney, PT, DPT Acute Rehabilitation Services  Office: [phone]    Jewel Baize 08/03/2022, 5:08 PM

## 2022-08-03 NOTE — Telephone Encounter (Signed)
Pharmacy Patient Advocate Encounter  Insurance verification completed.    The patient is insured through [employer] Part D   The patient is currently admitted and ran test claims for the following: Eliquis .  Copays and coinsurance results were relayed to Inpatient clinical team.      

## 2022-08-04 DIAGNOSIS — E1169 Type 2 diabetes mellitus with other specified complication: Secondary | ICD-10-CM | POA: Diagnosis not present

## 2022-08-04 DIAGNOSIS — B370 Candidal stomatitis: Secondary | ICD-10-CM | POA: Diagnosis not present

## 2022-08-04 DIAGNOSIS — G4733 Obstructive sleep apnea (adult) (pediatric): Secondary | ICD-10-CM | POA: Diagnosis not present

## 2022-08-04 DIAGNOSIS — R531 Weakness: Secondary | ICD-10-CM | POA: Diagnosis not present

## 2022-08-04 DIAGNOSIS — I10 Essential (primary) hypertension: Secondary | ICD-10-CM | POA: Diagnosis not present

## 2022-08-04 DIAGNOSIS — I6389 Other cerebral infarction: Secondary | ICD-10-CM | POA: Diagnosis not present

## 2022-08-04 DIAGNOSIS — S82142A Displaced bicondylar fracture of left tibia, initial encounter for closed fracture: Secondary | ICD-10-CM | POA: Diagnosis not present

## 2022-08-04 DIAGNOSIS — K5900 Constipation, unspecified: Secondary | ICD-10-CM | POA: Diagnosis not present

## 2022-08-04 DIAGNOSIS — M7989 Other specified soft tissue disorders: Secondary | ICD-10-CM | POA: Diagnosis not present

## 2022-08-04 DIAGNOSIS — M7061 Trochanteric bursitis, right hip: Secondary | ICD-10-CM | POA: Diagnosis not present

## 2022-08-04 DIAGNOSIS — R52 Pain, unspecified: Secondary | ICD-10-CM | POA: Diagnosis not present

## 2022-08-04 DIAGNOSIS — S82142D Displaced bicondylar fracture of left tibia, subsequent encounter for closed fracture with routine healing: Secondary | ICD-10-CM | POA: Diagnosis not present

## 2022-08-04 DIAGNOSIS — B3781 Candidal esophagitis: Secondary | ICD-10-CM | POA: Diagnosis not present

## 2022-08-04 DIAGNOSIS — W19XXXA Unspecified fall, initial encounter: Secondary | ICD-10-CM | POA: Diagnosis not present

## 2022-08-04 DIAGNOSIS — L259 Unspecified contact dermatitis, unspecified cause: Secondary | ICD-10-CM | POA: Diagnosis not present

## 2022-08-04 DIAGNOSIS — Z4789 Encounter for other orthopedic aftercare: Secondary | ICD-10-CM | POA: Diagnosis not present

## 2022-08-04 DIAGNOSIS — F411 Generalized anxiety disorder: Secondary | ICD-10-CM | POA: Diagnosis not present

## 2022-08-04 DIAGNOSIS — R0989 Other specified symptoms and signs involving the circulatory and respiratory systems: Secondary | ICD-10-CM | POA: Diagnosis not present

## 2022-08-04 DIAGNOSIS — R262 Difficulty in walking, not elsewhere classified: Secondary | ICD-10-CM | POA: Diagnosis not present

## 2022-08-04 DIAGNOSIS — M79662 Pain in left lower leg: Secondary | ICD-10-CM | POA: Diagnosis not present

## 2022-08-04 DIAGNOSIS — G4700 Insomnia, unspecified: Secondary | ICD-10-CM | POA: Diagnosis not present

## 2022-08-04 DIAGNOSIS — K219 Gastro-esophageal reflux disease without esophagitis: Secondary | ICD-10-CM | POA: Diagnosis not present

## 2022-08-04 DIAGNOSIS — S82202D Unspecified fracture of shaft of left tibia, subsequent encounter for closed fracture with routine healing: Secondary | ICD-10-CM | POA: Diagnosis not present

## 2022-08-04 DIAGNOSIS — I82402 Acute embolism and thrombosis of unspecified deep veins of left lower extremity: Secondary | ICD-10-CM | POA: Diagnosis not present

## 2022-08-04 DIAGNOSIS — L299 Pruritus, unspecified: Secondary | ICD-10-CM | POA: Diagnosis not present

## 2022-08-04 DIAGNOSIS — S83242D Other tear of medial meniscus, current injury, left knee, subsequent encounter: Secondary | ICD-10-CM | POA: Diagnosis not present

## 2022-08-04 DIAGNOSIS — M25562 Pain in left knee: Secondary | ICD-10-CM | POA: Diagnosis not present

## 2022-08-04 DIAGNOSIS — M6281 Muscle weakness (generalized): Secondary | ICD-10-CM | POA: Diagnosis not present

## 2022-08-04 DIAGNOSIS — M80062D Age-related osteoporosis with current pathological fracture, left lower leg, subsequent encounter for fracture with routine healing: Secondary | ICD-10-CM | POA: Diagnosis not present

## 2022-08-04 DIAGNOSIS — I82452 Acute embolism and thrombosis of left peroneal vein: Secondary | ICD-10-CM | POA: Diagnosis not present

## 2022-08-04 DIAGNOSIS — E785 Hyperlipidemia, unspecified: Secondary | ICD-10-CM | POA: Diagnosis not present

## 2022-08-04 DIAGNOSIS — E039 Hypothyroidism, unspecified: Secondary | ICD-10-CM | POA: Diagnosis not present

## 2022-08-04 LAB — BASIC METABOLIC PANEL
Anion gap: 9 (ref 5–15)
BUN: 8 mg/dL (ref 8–23)
CO2: 23 mmol/L (ref 22–32)
Calcium: 8.5 mg/dL — ABNORMAL LOW (ref 8.9–10.3)
Chloride: 106 mmol/L (ref 98–111)
Creatinine, Ser: 0.64 mg/dL (ref 0.44–1.00)
GFR, Estimated: >60 mL/min (ref >60)
Glucose, Bld: 245 mg/dL — ABNORMAL HIGH (ref 70–99)
Potassium: 3.9 mmol/L (ref 3.5–5.1)
Sodium: 138 mmol/L (ref 135–145)

## 2022-08-04 LAB — CBC
HCT: 26.1 % — ABNORMAL LOW (ref 36.0–46.0)
Hemoglobin: 8.5 g/dL — ABNORMAL LOW (ref 12.0–15.0)
MCH: 26.9 pg (ref 26.0–34.0)
MCHC: 32.6 g/dL (ref 30.0–36.0)
MCV: 82.6 fL (ref 80.0–100.0)
Platelets: 251 10*3/uL (ref 150–400)
RBC: 3.16 MIL/uL — ABNORMAL LOW (ref 3.87–5.11)
RDW: 14.6 % (ref 11.5–15.5)
WBC: 7.8 10*3/uL (ref 4.0–10.5)
nRBC: 0 % (ref 0.0–0.2)

## 2022-08-04 LAB — MAGNESIUM: Magnesium: 1.8 mg/dL (ref 1.7–2.4)

## 2022-08-04 LAB — GLUCOSE, CAPILLARY
Glucose-Capillary: 222 mg/dL — ABNORMAL HIGH (ref 70–99)
Glucose-Capillary: 92 mg/dL (ref 70–99)

## 2022-08-04 MED ORDER — OXYCODONE HCL 5 MG PO TABS: 5.0000 mg | ORAL_TABLET | ORAL | 0 refills | Status: DC | PRN

## 2022-08-04 MED ORDER — METHOCARBAMOL 500 MG PO TABS: 500.0000 mg | ORAL_TABLET | Freq: Four times a day (QID) | ORAL | 0 refills | Status: DC | PRN

## 2022-08-04 MED ORDER — POLYETHYLENE GLYCOL 3350 17 G PO PACK: 17.0000 g | PACK | Freq: Every day | ORAL | 0 refills | Status: DC | PRN

## 2022-08-04 MED ORDER — APIXABAN (ELIQUIS) VTE STARTER PACK (10MG AND 5MG): ORAL_TABLET | ORAL | 0 refills | Status: DC

## 2022-08-04 MED ORDER — DOCUSATE SODIUM 100 MG PO CAPS: 100.0000 mg | ORAL_CAPSULE | Freq: Two times a day (BID) | ORAL | 0 refills | Status: DC

## 2022-08-04 MED ORDER — ASPIRIN 325 MG PO TBEC
325.0000 mg | DELAYED_RELEASE_TABLET | Freq: Every day | ORAL | 0 refills | Status: DC
Start: 2022-08-04 — End: 2022-08-04

## 2022-08-04 MED ORDER — APIXABAN 5 MG PO TABS: 5.0000 mg | ORAL_TABLET | Freq: Two times a day (BID) | ORAL | Status: DC

## 2022-08-04 NOTE — Discharge Summary (Signed)
Physician Discharge Summary   Patient: [name] MRN: [id number] DOB: [birth year]  Admit date:     07/29/2022  Discharge date: 08/04/22  Discharge Physician: Thad Ranger, MD    PCP: Geoffry Paradise, MD   Recommendations at discharge:   Continue aspirin 81 mg daily (due to prior history of CVA) Started on apixaban 10 mg twice a day for 1 week.  On 08/09/2022, started apixaban 5 mg twice daily for minimum of 3 months for provoked DVT. Outpatient follow-up with orthopedics in 2 weeks, continue PT OT  Discharge Diagnoses:    Left tibial fracture Acute left lower extremity DVT   Obstructive sleep apnea   HYPERTENSION   G E R D   Chronic insomnia   CVA (cerebral vascular accident) [facility])    Hospital Course: [name]  is a 77 y.o. female, with past medical history of CVA, hypertension, hyperlipidemia, GERD, OSA, patient presented to ED secondary to fall, she was brought via EMS, for left knee and ankle pain.  She was standing on the bed, trying to kill a fly on the ceiling, she fell off the bed, and onto the ground, She denied any head trauma, loss of consciousness however she could not get off the ground secondary to pain, mainly in the left ankle area.   - in ED workup revealed proximal tibial fracture.  Status post repair on 07/30/2022.  Assessment and Plan:  Left fibular fracture status post repair on 07/30/2022: -Secondary to mechanical fall, she sustained Comminuted fracture of the both medial and lateral tibial plateau predominantly involving the medial tibial plateau (Schatzker type 5) -Status post open reduction internal fixation of left bicondylar tibial plateau fracture on 07/30/2022. -Continue pain control. -As patient developed acute left lower extremity DVT, she will be on therapeutic apixaban. -Follow-up with Ortho in 2 weeks after the discharge for wound check and repeat x-rays. Continue PT OT with assistance and fall precautions.   Left lower extremity  edema and pain Acute left lower extremity DVT Continue Eliquis 10 mg twice daily started on 08/02/2022.  Starting on 08/09/2022, continue Eliquis 5 mg twice daily for at least minimum of 3 months for provoked DVT     Hypertension -BP currently stable, continue home medications including amlodipine, metoprolol, irbesartan    History of CVA -Continue secondary CVA prevention with aspirin, statin   Diabetes mellitus, type II with hyperglycemia -Hemoglobin A1c 7.2 on 07/29/2022 -Continue outpatient regimen   GERD Continue PPI   OSA -Continue with CPAP   Hyperlipidemia: Continue rosuvastatin   Normocytic anemia:  -Stable, continue to monitor H&H     Leukocytosis:  Resolved, likely reactive         Pain control - [place] Controlled Substance Reporting System database was reviewed. and patient was instructed, not to drive, operate heavy machinery, perform activities at heights, swimming or participation in water activities or provide baby-sitting services while on Pain, Sleep and Anxiety Medications; until their outpatient Physician has advised to do so again. Also recommended to not to take more than prescribed Pain, Sleep and Anxiety Medications.  Consultants: Orthopedics Procedures performed:  CPT 27536-Open reduction internal fixation of left bicondylar tibial plateau fracture CPT 27403-Repair of left medial meniscus  Disposition: Skilled nursing facility Diet recommendation:  Discharge Diet Orders (From admission, onward)     Start     Ordered   08/04/22 0000  Diet Carb Modified        08/04/22 1016  Carb modified diet DISCHARGE MEDICATION: Allergies as of 08/04/2022       Reactions   Other Other (See Comments)   NKDA        Medication List     STOP taking these medications    B12 5000 MCG Subl   triamterene-hydrochlorothiazide 37.5-25 MG tablet Commonly known as: MAXZIDE-25       TAKE these medications    acetaminophen 500 MG  tablet Commonly known as: TYLENOL Take 1 tablet (500 mg total) by mouth every 6 (six) hours as needed.   amLODipine 5 MG tablet Commonly known as: NORVASC Take 5 mg by mouth daily.   Apixaban Starter Pack (10mg  and 5mg ) Commonly known as: ELIQUIS STARTER PACK Take as directed on package: start with two-5mg  tablets twice daily for 7 days. On day 8 (08/09/22), switch to one-5mg  tablet twice daily.   apixaban 5 MG Tabs tablet Commonly known as: ELIQUIS Take 1 tablet (5 mg total) by mouth 2 (two) times daily. Start this prescription on 08/09/22 when the starter pack is completed Start taking on: Aug 09, 2022   aspirin 81 MG chewable tablet Chew 81 mg by mouth daily.   chlorhexidine 4 % external liquid Commonly known as: HIBICLENS Apply 15 mLs (1 Application total) topically as directed for 30 doses. Use as directed daily for 5 days every other week for 6 weeks.   docusate sodium 100 MG capsule Commonly known as: COLACE Take 1 capsule (100 mg total) by mouth 2 (two) times daily.   DULoxetine 30 MG capsule Commonly known as: CYMBALTA Take 1 capsule by mouth daily.   insulin lispro 100 UNIT/ML injection Commonly known as: HUMALOG Inject 5-10 Units into the skin 3 (three) times daily before meals.   irbesartan 300 MG tablet Commonly known as: AVAPRO Take 300 mg by mouth daily.   methocarbamol 500 MG tablet Commonly known as: ROBAXIN Take 1 tablet (500 mg total) by mouth every 6 (six) hours as needed for muscle spasms.   metoprolol succinate 50 MG 24 hr tablet Commonly known as: TOPROL-XL Take 50 mg by mouth daily.   Mounjaro 10 MG/0.5ML Pen Generic drug: tirzepatide Inject 10 mg into the skin once a week.   mupirocin ointment 2 % Commonly known as: BACTROBAN Place 1 Application into the nose 2 (two) times daily for 60 doses. Use as directed 2 times daily for 5 days every other week for 6 weeks.   omeprazole 20 MG capsule Commonly known as: PRILOSEC Take 20 mg by mouth  daily.   oxyCODONE 5 MG immediate release tablet Commonly known as: Oxy IR/ROXICODONE Take 1 tablet (5 mg total) by mouth every 4 (four) hours as needed for moderate pain.   polyethylene glycol 17 g packet Commonly known as: MIRALAX / GLYCOLAX Take 17 g by mouth daily as needed for mild constipation.   rosuvastatin 20 MG tablet Commonly known as: CRESTOR Take 20 mg by mouth at bedtime.   Toujeo SoloStar 300 UNIT/ML Solostar Pen Generic drug: insulin glargine (1 Unit Dial) Inject 60 Units into the skin daily.        Follow-up Information     Haddix, Gillie Manners, MD. Schedule an appointment as soon as possible for a visit in 2 week(s).   Specialty: Orthopedic Surgery Why: for wound check and repeat x-rays Contact information: [address] [place] [address] [phone]         Geoffry Paradise, MD. Schedule an appointment as soon as possible for a visit  in 2 week(s).   Specialty: Internal Medicine Why: for hospital follow-up Contact information: [address] [place] [address] [phone]                Discharge Exam: Ceasar Mons Weights   07/29/22 1409 07/30/22 1138  Weight: 78.9 kg 78.9 kg   S: No acute complaints, looking forward to discharge.  Pain controlled.  No fevers or chills, chest pain or shortness of breath.  BP 127/62 (BP Location: Left Arm)   Pulse 96   Temp 99.2 F (37.3 C)   Resp 18   Ht 5\' 3"  (1.6 m)   Wt 78.9 kg   SpO2 96%   BMI 30.81 kg/m    Physical Exam General: Alert and oriented x 3, NAD Cardiovascular: S1 S2 clear, RRR.  Respiratory: CTAB, no wheezing, rales or rhonchi Gastrointestinal: Soft, nontender, nondistended, NBS Ext: left leg edema+,  dressing intact Neuro: no new deficits Psych: Normal affect    Condition at discharge: fair  The results of significant diagnostics from this hospitalization (including imaging, microbiology, ancillary and laboratory) are listed below for reference.   Imaging  Studies: VAS [place] LOWER EXTREMITY VENOUS (DVT)  Result Date: 08/02/2022  Lower Venous DVT Study Patient Name:  JOVI GRIESHOP Zoll  Date of Exam:   08/02/2022 Medical Rec #: [id number]          Accession #:    [id number] Date of Birth: [birth year]         Patient Gender: F Patient Age:   25 years Exam Location:  [facility] Procedure:      VAS [place] LOWER EXTREMITY VENOUS (DVT) Referring Phys: Enid Derry HALL --------------------------------------------------------------------------------  Indications: Swelling, and Pain. Other Indications: OPEN REDUCTION INTERNAL FIXATION STATUS POST LEFT (ORIF)                    TIBIAL PLATEAU SURGERY ON 07/30/22. Performing Technologist: Marilynne Halsted RDMS, RVT  Examination Guidelines: A complete evaluation includes B-mode imaging, spectral Doppler, color Doppler, and power Doppler as needed of all accessible portions of each vessel. Bilateral testing is considered an integral part of a complete examination. Limited examinations for reoccurring indications may be performed as noted. The reflux portion of the exam is performed with the patient in reverse Trendelenburg.  +-----+---------------+---------+-----------+----------+--------------+ RIGHTCompressibilityPhasicitySpontaneityPropertiesThrombus Aging +-----+---------------+---------+-----------+----------+--------------+ CFV  Full           Yes      Yes                                 +-----+---------------+---------+-----------+----------+--------------+ SFJ  Full                                                        +-----+---------------+---------+-----------+----------+--------------+   +---------+---------------+---------+-----------+----------+--------------+ LEFT     CompressibilityPhasicitySpontaneityPropertiesThrombus Aging +---------+---------------+---------+-----------+----------+--------------+ CFV      Full           Yes      Yes                                  +---------+---------------+---------+-----------+----------+--------------+ SFJ      Full                                                        +---------+---------------+---------+-----------+----------+--------------+  FV Prox  Full                                                        +---------+---------------+---------+-----------+----------+--------------+ FV Mid   Full                                                        +---------+---------------+---------+-----------+----------+--------------+ FV DistalFull                                                        +---------+---------------+---------+-----------+----------+--------------+ PFV      Full                                                        +---------+---------------+---------+-----------+----------+--------------+ POP      Full           Yes      Yes                                 +---------+---------------+---------+-----------+----------+--------------+ PTV      Full                                                        +---------+---------------+---------+-----------+----------+--------------+ PERO     None           No       No                   Acute          +---------+---------------+---------+-----------+----------+--------------+     Summary: LEFT: - Findings consistent with acute deep vein thrombosis involving the left peroneal veins. - No cystic structure found in the popliteal fossa.  *See table(s) above for measurements and observations. Electronically signed by Sherald Hess MD on 08/02/2022 at 3:03:38 PM.    Final    DG Knee Left Port  Result Date: 07/30/2022 CLINICAL DATA:  Fracture, postop. EXAM: PORTABLE LEFT KNEE - 1-2 VIEW COMPARISON:  Preoperative imaging. FINDINGS: Lateral plate and multi screw fixation of comminuted proximal tibial fracture. Improved fracture alignment from preoperative imaging. Recent postsurgical change includes air and edema in the  soft tissues and joint space. IMPRESSION: ORIF of comminuted proximal tibial fracture, in improved alignment from preoperative imaging. Electronically Signed   By: Narda Rutherford M.D.   On: 07/30/2022 16:49   DG Tibia/Fibula Left  Result Date: 07/30/2022 CLINICAL DATA:  [id number] Surgery, elective [id number] EXAM: LEFT TIBIA AND FIBULA - 2 VIEW COMPARISON:  CT 07/29/2022 FINDINGS: Intraoperative images during tibial plateau fracture ORIF with medial plate fixation. Improved fracture alignment. Hardware is intact without  evidence of loosening. IMPRESSION: Intraoperative images during tibial plateau fracture ORIF. Improved fracture alignment. No evidence of immediate hardware complication. Electronically Signed   By: Caprice Renshaw M.D.   On: 07/30/2022 14:56   DG C-Arm 1-60 Min-No Report  Result Date: 07/30/2022 Fluoroscopy was utilized by the requesting physician.  No radiographic interpretation.   DG C-Arm 1-60 Min-No Report  Result Date: 07/30/2022 Fluoroscopy was utilized by the requesting physician.  No radiographic interpretation.   CT Knee Left Wo Contrast  Result Date: 07/29/2022 CLINICAL DATA:  Knee trauma, tibial plateau fracture. Fell off the bed. The the EXAM: CT OF THE LEFT KNEE WITHOUT CONTRAST TECHNIQUE: Multidetector CT imaging of the left knee was performed according to the standard protocol. Multiplanar CT image reconstructions were also generated. RADIATION DOSE REDUCTION: This exam was performed according to the departmental dose-optimization program which includes automated exposure control, adjustment of the mA and/or kV according to patient size and/or use of iterative reconstruction technique. COMPARISON:  Radiograph performed earlier on the same date FINDINGS: Bones/Joint/Cartilage There is a comminuted fracture of the both medial and lateral tibial plateau predominantly involving the medial tibial plateau. There is a displaced wedge-shaped fracture of the medial malleolus with a  proximally 2.5 cm displacement at the articular surface. The large anterior fracture fragment measures a proximally 3.2 x 2.0 cm and is anterosuperiorly displaced. There is also mildly depressed fracture of the posterior aspect of the lateral tibial plateau. Comminuted fracture of the tibial plateau also involves the tibial spine. There is moderate suprapatellar joint effusion with fat fluid levels. The patella, distal femur and fibula appear intact. Ligaments Suboptimally assessed by CT. Muscles and Tendons Muscles are normal in bulk. No intramuscular hematoma or fluid collection. Soft tissues Soft tissue edema about the medial aspect of the proximal tibia and femur. No fluid collection. IMPRESSION: 1. Comminuted fracture of the both medial and lateral tibial plateau predominantly involving the medial tibial plateau (Schatzker type 5), as detailed above. 2. The fracture involves the tibial spine about the insertion of the cruciate ligaments concerning for ligamentous instability. 3. Moderate suprapatellar joint effusion with fat fluid levels. Electronically Signed   By: Larose Hires D.O.   On: 07/29/2022 16:17   DG Chest Portable 1 View  Result Date: 07/29/2022 CLINICAL DATA:  Fall EXAM: PORTABLE CHEST 1 VIEW COMPARISON:  Chest x-ray dated July 07, 2020 FINDINGS: The heart size and mediastinal contours are within normal limits. Both lungs are clear. The visualized skeletal structures are unremarkable. IMPRESSION: No active disease. Electronically Signed   By: Allegra Lai M.D.   On: 07/29/2022 15:23   DG Ankle Complete Left  Result Date: 07/29/2022 CLINICAL DATA:  LEFT ankle pain post fall today EXAM: LEFT ANKLE COMPLETE - 3+ VIEW COMPARISON:  None available FINDINGS: See accompanying LEFT tibial/fibular exam for description of medial tibial plateau fracture. Ankle joint alignment normal. Osseous mineralization normal. No ankle fracture, dislocation, or bone destruction. K-wire at first metatarsal.  IMPRESSION: No ankle abnormalities. Electronically Signed   By: Ulyses Southward M.D.   On: 07/29/2022 15:04   DG Tibia/Fibula Left  Result Date: 07/29/2022 CLINICAL DATA:  LEFT knee and ankle pain post fall today EXAM: LEFT TIBIA AND FIBULA - 2 VIEW COMPARISON:  None Available. FINDINGS: Osseous demineralization. Comminuted depressed fracture of the medial LEFT tibial plateau extending into tibial spines. Dominant articular fracture fragment appears depressed and displaced anteriorly. No additional fracture, dislocation, or bone destruction. Associated knee joint effusion. IMPRESSION: Comminuted displaced intra-articular fracture  at LEFT medial tibial plateau with associated joint effusion. Electronically Signed   By: Ulyses Southward M.D.   On: 07/29/2022 15:02    Microbiology: Results for orders placed or performed during the hospital encounter of 07/29/22  Surgical PCR screen     Status: Abnormal   Collection Time: 07/30/22  1:31 AM   Specimen: Nasal Mucosa; Nasal Swab  Result Value Ref Range Status   MRSA, PCR POSITIVE (A) NEGATIVE Final    Comment: RESULT CALLED TO, READ BACK BY AND VERIFIED WITH: R ALLEN,RN@0620  07/30/22 MK    Staphylococcus aureus POSITIVE (A) NEGATIVE Final    Comment: (NOTE) The Xpert SA Assay (FDA approved for NASAL specimens in patients 75 years of age and older), is one component of a comprehensive surveillance program. It is not intended to diagnose infection nor to guide or monitor treatment. Performed at [facility], [address] [place], [place] [address]     Labs: CBC: Recent Labs  Lab 07/30/22 1623 07/31/22 0413 08/01/22 0500 08/02/22 0435 08/04/22 0852  WBC 13.7* 11.0* 11.8* 10.3 7.8  HGB 10.9* 10.3* 9.8* 9.4* 8.5*  HCT 33.5* 31.0* 29.9* 28.9* 26.1*  MCV 80.7 80.3 83.1 82.6 82.6  PLT 219 209 209 228 251   Basic Metabolic Panel: Recent Labs  Lab 07/30/22 0641 07/30/22 1623 07/31/22 0407 07/31/22 0413 08/01/22 0500 08/02/22 0435  08/04/22 0852  NA 137  --   --  134* 137 134* 138  K 4.5  --   --  5.2* 4.7 4.2 3.9  CL 104  --   --  100 105 106 106  CO2 24  --   --  24 25 23 23   GLUCOSE 140*  --   --  242* 158* 123* 245*  BUN 16  --   --  14 15 10 8   CREATININE 0.82 0.90  --  0.88 0.88 0.66 0.64  CALCIUM 9.0  --   --  9.0 8.9 8.3* 8.5*  MG  --   --  1.9  --   --  2.0 1.8   Liver Function Tests: No results for input(s): "AST", "ALT", "ALKPHOS", "BILITOT", "PROT", "ALBUMIN" in the last 168 hours. CBG: Recent Labs  Lab 08/03/22 0736 08/03/22 1156 08/03/22 1657 08/03/22 2001 08/04/22 0812  GLUCAP 182* 159* 164* 134* 222*    Discharge time spent: greater than 30 minutes.  Signed: Thad Ranger, MD [facility] 08/04/2022

## 2022-08-04 NOTE — TOC Transition Note (Signed)
Transition of Care [facility]) - CM/SW Discharge Note   Patient Details  Name: [name] MRN: [id number] Date of Birth: Sep 01, 1945  Transition of Care [facility]) CM/SW Contact:  Godwin Tedesco A [place], Theresia Majors Phone Number: 08/04/2022, 4:39 PM   Clinical Narrative:     Patient will DC to: [facility]  Anticipated DC date: 08/04/22  Family notified: Jeanett Schlein  Transport by: pt's family/partnered transport      Per MD patient ready for DC to . RN, patient, patient's family, and facility notified of DC. Discharge Summary and FL2 sent to facility. RN to call report prior to discharge ( [phone] ). DC packet on chart. Family providing transportation.      CSW will sign off for now as social work intervention is no longer needed. Please consult [place] again if new needs arise.   Final next level of care: Skilled Nursing Facility Barriers to Discharge: Barriers Resolved   Patient Goals and CMS Choice      Discharge Placement                Patient chooses bed at: [facility] Patient to be transferred to facility by: Pt's daughter is providing transportation Name of family member notified: Jeanett Schlein Patient and family notified of of transfer: 08/04/22  Discharge Plan and Services Additional resources added to the After Visit Summary for   In-house Referral: Clinical Social Work                                   Social Determinants of Health (SDOH) Interventions SDOH Screenings   Food Insecurity: No Food Insecurity (07/30/2022)  Housing: Low Risk  (07/30/2022)  Transportation Needs: No Transportation Needs (07/30/2022)  Utilities: Not At Risk (07/30/2022)  Tobacco Use: Low Risk  (07/31/2022)     Readmission Risk Interventions     No data to display

## 2022-08-05 DIAGNOSIS — K5900 Constipation, unspecified: Secondary | ICD-10-CM | POA: Diagnosis not present

## 2022-08-05 DIAGNOSIS — R531 Weakness: Secondary | ICD-10-CM | POA: Diagnosis not present

## 2022-08-05 DIAGNOSIS — G4733 Obstructive sleep apnea (adult) (pediatric): Secondary | ICD-10-CM | POA: Diagnosis not present

## 2022-08-05 DIAGNOSIS — K219 Gastro-esophageal reflux disease without esophagitis: Secondary | ICD-10-CM | POA: Diagnosis not present

## 2022-08-05 DIAGNOSIS — R52 Pain, unspecified: Secondary | ICD-10-CM | POA: Diagnosis not present

## 2022-08-05 DIAGNOSIS — E785 Hyperlipidemia, unspecified: Secondary | ICD-10-CM | POA: Diagnosis not present

## 2022-08-05 DIAGNOSIS — E039 Hypothyroidism, unspecified: Secondary | ICD-10-CM | POA: Diagnosis not present

## 2022-08-05 DIAGNOSIS — L259 Unspecified contact dermatitis, unspecified cause: Secondary | ICD-10-CM | POA: Diagnosis not present

## 2022-08-05 DIAGNOSIS — E1169 Type 2 diabetes mellitus with other specified complication: Secondary | ICD-10-CM | POA: Diagnosis not present

## 2022-08-05 DIAGNOSIS — I82402 Acute embolism and thrombosis of unspecified deep veins of left lower extremity: Secondary | ICD-10-CM | POA: Diagnosis not present

## 2022-08-05 DIAGNOSIS — I10 Essential (primary) hypertension: Secondary | ICD-10-CM | POA: Diagnosis not present

## 2022-08-06 DIAGNOSIS — E785 Hyperlipidemia, unspecified: Secondary | ICD-10-CM | POA: Diagnosis not present

## 2022-08-06 DIAGNOSIS — B370 Candidal stomatitis: Secondary | ICD-10-CM | POA: Diagnosis not present

## 2022-08-06 DIAGNOSIS — R52 Pain, unspecified: Secondary | ICD-10-CM | POA: Diagnosis not present

## 2022-08-06 DIAGNOSIS — L299 Pruritus, unspecified: Secondary | ICD-10-CM | POA: Diagnosis not present

## 2022-08-06 DIAGNOSIS — I10 Essential (primary) hypertension: Secondary | ICD-10-CM | POA: Diagnosis not present

## 2022-08-06 DIAGNOSIS — K5900 Constipation, unspecified: Secondary | ICD-10-CM | POA: Diagnosis not present

## 2022-08-06 DIAGNOSIS — I82402 Acute embolism and thrombosis of unspecified deep veins of left lower extremity: Secondary | ICD-10-CM | POA: Diagnosis not present

## 2022-08-06 DIAGNOSIS — R531 Weakness: Secondary | ICD-10-CM | POA: Diagnosis not present

## 2022-08-06 DIAGNOSIS — B3781 Candidal esophagitis: Secondary | ICD-10-CM | POA: Diagnosis not present

## 2022-08-08 DIAGNOSIS — L259 Unspecified contact dermatitis, unspecified cause: Secondary | ICD-10-CM | POA: Diagnosis not present

## 2022-08-08 DIAGNOSIS — B370 Candidal stomatitis: Secondary | ICD-10-CM | POA: Diagnosis not present

## 2022-08-08 DIAGNOSIS — L299 Pruritus, unspecified: Secondary | ICD-10-CM | POA: Diagnosis not present

## 2022-08-08 DIAGNOSIS — R531 Weakness: Secondary | ICD-10-CM | POA: Diagnosis not present

## 2022-08-08 DIAGNOSIS — I82402 Acute embolism and thrombosis of unspecified deep veins of left lower extremity: Secondary | ICD-10-CM | POA: Diagnosis not present

## 2022-08-09 DIAGNOSIS — E785 Hyperlipidemia, unspecified: Secondary | ICD-10-CM | POA: Diagnosis not present

## 2022-08-09 DIAGNOSIS — E1169 Type 2 diabetes mellitus with other specified complication: Secondary | ICD-10-CM | POA: Diagnosis not present

## 2022-08-09 DIAGNOSIS — G4733 Obstructive sleep apnea (adult) (pediatric): Secondary | ICD-10-CM | POA: Diagnosis not present

## 2022-08-09 DIAGNOSIS — E039 Hypothyroidism, unspecified: Secondary | ICD-10-CM | POA: Diagnosis not present

## 2022-08-10 DIAGNOSIS — S83242D Other tear of medial meniscus, current injury, left knee, subsequent encounter: Secondary | ICD-10-CM | POA: Diagnosis not present

## 2022-08-10 DIAGNOSIS — L299 Pruritus, unspecified: Secondary | ICD-10-CM | POA: Diagnosis not present

## 2022-08-10 DIAGNOSIS — F411 Generalized anxiety disorder: Secondary | ICD-10-CM | POA: Diagnosis not present

## 2022-08-10 DIAGNOSIS — S82142D Displaced bicondylar fracture of left tibia, subsequent encounter for closed fracture with routine healing: Secondary | ICD-10-CM | POA: Diagnosis not present

## 2022-08-10 DIAGNOSIS — G4700 Insomnia, unspecified: Secondary | ICD-10-CM | POA: Diagnosis not present

## 2022-08-10 DIAGNOSIS — L259 Unspecified contact dermatitis, unspecified cause: Secondary | ICD-10-CM | POA: Diagnosis not present

## 2022-08-15 DIAGNOSIS — R0989 Other specified symptoms and signs involving the circulatory and respiratory systems: Secondary | ICD-10-CM | POA: Diagnosis not present

## 2022-08-15 DIAGNOSIS — R531 Weakness: Secondary | ICD-10-CM | POA: Diagnosis not present

## 2022-08-15 DIAGNOSIS — W19XXXA Unspecified fall, initial encounter: Secondary | ICD-10-CM | POA: Diagnosis not present

## 2022-08-15 DIAGNOSIS — M7989 Other specified soft tissue disorders: Secondary | ICD-10-CM | POA: Diagnosis not present

## 2022-08-16 DIAGNOSIS — M25562 Pain in left knee: Secondary | ICD-10-CM | POA: Diagnosis not present

## 2022-08-16 DIAGNOSIS — F411 Generalized anxiety disorder: Secondary | ICD-10-CM | POA: Diagnosis not present

## 2022-08-16 DIAGNOSIS — M7989 Other specified soft tissue disorders: Secondary | ICD-10-CM | POA: Diagnosis not present

## 2022-08-16 DIAGNOSIS — L299 Pruritus, unspecified: Secondary | ICD-10-CM | POA: Diagnosis not present

## 2022-08-18 DIAGNOSIS — I82402 Acute embolism and thrombosis of unspecified deep veins of left lower extremity: Secondary | ICD-10-CM | POA: Diagnosis not present

## 2022-08-18 DIAGNOSIS — R531 Weakness: Secondary | ICD-10-CM | POA: Diagnosis not present

## 2022-08-18 DIAGNOSIS — R52 Pain, unspecified: Secondary | ICD-10-CM | POA: Diagnosis not present

## 2022-08-19 ENCOUNTER — Telehealth: Payer: Self-pay | Admitting: Pharmacy Technician

## 2022-08-19 DIAGNOSIS — Z5986 Financial insecurity: Secondary | ICD-10-CM

## 2022-08-19 NOTE — Progress Notes (Signed)
[facility] Customer service manager [facility])                                            [facility] Quality Pharmacy Team    08/19/2022  [name] 1946/01/04 [id number]  Successful outreach to patient in regard to [employer] application.  HIPAA verified.  Patient informs she did not receive the application. She informs she has been in the hospital and rehab and is getting ready to be discharged. She informs she has not received the [employer] application. She inquires if we could also send her an application for Eliquis thru [employer] patient assistance foundation as she was started on that medication in the hospital. She informs to have Dr. Jacky Kindle sign the provider portion of the form.                                      Medication Assistance Referral  Referral From:  Patient, self referral  Medication/Company: Eliquis / [employer] Patient application portion:  Mailed Provider application portion: Faxed  to Dr. Geoffry Paradise Provider address/fax verified via: Office website  Eugene Zeiders P. Erwin Nishiyama, CPhT [facility] [employer]  [phone]

## 2022-08-20 DIAGNOSIS — S82402D Unspecified fracture of shaft of left fibula, subsequent encounter for closed fracture with routine healing: Secondary | ICD-10-CM | POA: Diagnosis not present

## 2022-08-20 DIAGNOSIS — I2510 Atherosclerotic heart disease of native coronary artery without angina pectoris: Secondary | ICD-10-CM | POA: Diagnosis not present

## 2022-08-20 DIAGNOSIS — I82402 Acute embolism and thrombosis of unspecified deep veins of left lower extremity: Secondary | ICD-10-CM | POA: Diagnosis not present

## 2022-08-20 DIAGNOSIS — E785 Hyperlipidemia, unspecified: Secondary | ICD-10-CM | POA: Diagnosis not present

## 2022-08-20 DIAGNOSIS — E1165 Type 2 diabetes mellitus with hyperglycemia: Secondary | ICD-10-CM | POA: Diagnosis not present

## 2022-08-20 DIAGNOSIS — L259 Unspecified contact dermatitis, unspecified cause: Secondary | ICD-10-CM | POA: Diagnosis not present

## 2022-08-20 DIAGNOSIS — I10 Essential (primary) hypertension: Secondary | ICD-10-CM | POA: Diagnosis not present

## 2022-08-20 DIAGNOSIS — S82202D Unspecified fracture of shaft of left tibia, subsequent encounter for closed fracture with routine healing: Secondary | ICD-10-CM | POA: Diagnosis not present

## 2022-08-20 DIAGNOSIS — W19XXXD Unspecified fall, subsequent encounter: Secondary | ICD-10-CM | POA: Diagnosis not present

## 2022-08-23 DIAGNOSIS — L259 Unspecified contact dermatitis, unspecified cause: Secondary | ICD-10-CM | POA: Diagnosis not present

## 2022-08-23 DIAGNOSIS — S82202D Unspecified fracture of shaft of left tibia, subsequent encounter for closed fracture with routine healing: Secondary | ICD-10-CM | POA: Diagnosis not present

## 2022-08-23 DIAGNOSIS — E785 Hyperlipidemia, unspecified: Secondary | ICD-10-CM | POA: Diagnosis not present

## 2022-08-23 DIAGNOSIS — I82402 Acute embolism and thrombosis of unspecified deep veins of left lower extremity: Secondary | ICD-10-CM | POA: Diagnosis not present

## 2022-08-23 DIAGNOSIS — I10 Essential (primary) hypertension: Secondary | ICD-10-CM | POA: Diagnosis not present

## 2022-08-23 DIAGNOSIS — E1165 Type 2 diabetes mellitus with hyperglycemia: Secondary | ICD-10-CM | POA: Diagnosis not present

## 2022-08-23 DIAGNOSIS — S82402D Unspecified fracture of shaft of left fibula, subsequent encounter for closed fracture with routine healing: Secondary | ICD-10-CM | POA: Diagnosis not present

## 2022-08-23 DIAGNOSIS — I2510 Atherosclerotic heart disease of native coronary artery without angina pectoris: Secondary | ICD-10-CM | POA: Diagnosis not present

## 2022-08-23 DIAGNOSIS — W19XXXD Unspecified fall, subsequent encounter: Secondary | ICD-10-CM | POA: Diagnosis not present

## 2022-08-24 DIAGNOSIS — B3731 Acute candidiasis of vulva and vagina: Secondary | ICD-10-CM | POA: Diagnosis not present

## 2022-08-24 DIAGNOSIS — M792 Neuralgia and neuritis, unspecified: Secondary | ICD-10-CM | POA: Diagnosis not present

## 2022-08-24 DIAGNOSIS — S82142D Displaced bicondylar fracture of left tibia, subsequent encounter for closed fracture with routine healing: Secondary | ICD-10-CM | POA: Diagnosis not present

## 2022-08-24 DIAGNOSIS — I82402 Acute embolism and thrombosis of unspecified deep veins of left lower extremity: Secondary | ICD-10-CM | POA: Diagnosis not present

## 2022-08-24 DIAGNOSIS — D649 Anemia, unspecified: Secondary | ICD-10-CM | POA: Diagnosis not present

## 2022-08-24 DIAGNOSIS — B370 Candidal stomatitis: Secondary | ICD-10-CM | POA: Diagnosis not present

## 2022-08-24 DIAGNOSIS — E1169 Type 2 diabetes mellitus with other specified complication: Secondary | ICD-10-CM | POA: Diagnosis not present

## 2022-08-24 DIAGNOSIS — I10 Essential (primary) hypertension: Secondary | ICD-10-CM | POA: Diagnosis not present

## 2022-08-26 DIAGNOSIS — L259 Unspecified contact dermatitis, unspecified cause: Secondary | ICD-10-CM | POA: Diagnosis not present

## 2022-08-26 DIAGNOSIS — I2510 Atherosclerotic heart disease of native coronary artery without angina pectoris: Secondary | ICD-10-CM | POA: Diagnosis not present

## 2022-08-26 DIAGNOSIS — S82202D Unspecified fracture of shaft of left tibia, subsequent encounter for closed fracture with routine healing: Secondary | ICD-10-CM | POA: Diagnosis not present

## 2022-08-26 DIAGNOSIS — E785 Hyperlipidemia, unspecified: Secondary | ICD-10-CM | POA: Diagnosis not present

## 2022-08-26 DIAGNOSIS — S82402D Unspecified fracture of shaft of left fibula, subsequent encounter for closed fracture with routine healing: Secondary | ICD-10-CM | POA: Diagnosis not present

## 2022-08-26 DIAGNOSIS — W19XXXD Unspecified fall, subsequent encounter: Secondary | ICD-10-CM | POA: Diagnosis not present

## 2022-08-26 DIAGNOSIS — I82402 Acute embolism and thrombosis of unspecified deep veins of left lower extremity: Secondary | ICD-10-CM | POA: Diagnosis not present

## 2022-08-26 DIAGNOSIS — E1165 Type 2 diabetes mellitus with hyperglycemia: Secondary | ICD-10-CM | POA: Diagnosis not present

## 2022-08-26 DIAGNOSIS — I10 Essential (primary) hypertension: Secondary | ICD-10-CM | POA: Diagnosis not present

## 2022-08-31 ENCOUNTER — Ambulatory Visit: Payer: Self-pay | Admitting: Student

## 2022-08-31 DIAGNOSIS — I82402 Acute embolism and thrombosis of unspecified deep veins of left lower extremity: Secondary | ICD-10-CM | POA: Diagnosis not present

## 2022-08-31 DIAGNOSIS — E785 Hyperlipidemia, unspecified: Secondary | ICD-10-CM | POA: Diagnosis not present

## 2022-08-31 DIAGNOSIS — S82402D Unspecified fracture of shaft of left fibula, subsequent encounter for closed fracture with routine healing: Secondary | ICD-10-CM | POA: Diagnosis not present

## 2022-08-31 DIAGNOSIS — M79672 Pain in left foot: Secondary | ICD-10-CM | POA: Diagnosis not present

## 2022-08-31 DIAGNOSIS — W19XXXD Unspecified fall, subsequent encounter: Secondary | ICD-10-CM | POA: Diagnosis not present

## 2022-08-31 DIAGNOSIS — S82142D Displaced bicondylar fracture of left tibia, subsequent encounter for closed fracture with routine healing: Secondary | ICD-10-CM | POA: Diagnosis not present

## 2022-08-31 DIAGNOSIS — I10 Essential (primary) hypertension: Secondary | ICD-10-CM | POA: Diagnosis not present

## 2022-08-31 DIAGNOSIS — L259 Unspecified contact dermatitis, unspecified cause: Secondary | ICD-10-CM | POA: Diagnosis not present

## 2022-08-31 DIAGNOSIS — E1165 Type 2 diabetes mellitus with hyperglycemia: Secondary | ICD-10-CM | POA: Diagnosis not present

## 2022-08-31 DIAGNOSIS — I2510 Atherosclerotic heart disease of native coronary artery without angina pectoris: Secondary | ICD-10-CM | POA: Diagnosis not present

## 2022-08-31 DIAGNOSIS — S82202D Unspecified fracture of shaft of left tibia, subsequent encounter for closed fracture with routine healing: Secondary | ICD-10-CM | POA: Diagnosis not present

## 2022-09-01 ENCOUNTER — Encounter (HOSPITAL_COMMUNITY): Payer: Self-pay | Admitting: Student

## 2022-09-01 ENCOUNTER — Other Ambulatory Visit: Payer: Self-pay

## 2022-09-01 NOTE — Progress Notes (Signed)
SDW call  Patient was given pre-op instructions over the phone. Patient verbalized understanding of instructions provided.    PCP - Dr. Geoffry Paradise Cardiologist - Dr. Diona Browner Pulmonary: Dr. Felix Pacini   PPM/ICD - denies  Chest x-ray - 07/29/2022 EKG -  07/30/2022 Stress Test - ECHO - 10/29/2022 Cardiac Cath -   Sleep Study/sleep apnea/CPAP: Diagnosed with sleep apnea, Wears a CPAP nightly.  Will bring her mask and tubing  Type II diabetic Fasting Blood sugar range: 160-170 How often check sugars: 3-4 times/day Mounjaro, states last dose 08/26/2022 Toujeo Solostar, take 15 units (50% regular dose) morning of surgery Insulin lispro (Humalog), take zero units the morning of surgery.  If blood sugar > 220, use 1/2 regular sliding scale dose   Blood Thinner Instructions: Eliquis, states last dose 08/30/2022 Aspirin Instructions:denies   ERAS Protcol - No, NPO PRE-SURGERY Ensure or G2-    COVID TEST- n/a    Anesthesia review: Yes.  HTN, stroke, DM, sleep apnea   Patient denies shortness of breath, fever, cough and chest pain over the phone call  Your procedure is scheduled on Thursday September 02, 2022  Report to [facility] Main Entrance "A" at  0530  A.M., then check in with the Admitting office.  Call this number if you have problems the morning of surgery:  [phone]   If you have any questions prior to your surgery date call [phone]: Open Monday-Friday 8am-4pm If you experience any cold or flu symptoms such as cough, fever, chills, shortness of breath, etc. between now and your scheduled surgery, please notify us at the above number    Remember:  Do not eat or drink after midnight the night before your surgery   Take these medicines the morning of surgery with A SIP OF WATER:  Amlodipine, duloxetine, gabapentin,   As needed: Tylenol, robaxin, oxycodone  As of today, STOP taking any Aspirin (unless otherwise instructed by your surgeon) Aleve, Naproxen,  Ibuprofen, Motrin, Advil, Goody's, BC's, all herbal medications, fish oil, and all vitamins.

## 2022-09-01 NOTE — Progress Notes (Signed)
Anesthesia Chart Review: SAME DAY WORK-UP  Case: [id number] Date/Time: 09/02/22 0730   Procedure: REVISION OPEN REDUCTION INTERNAL FIXATION (ORIF) TIBIAL PLATEAU (Left)   Anesthesia type: General   Pre-op diagnosis: Failed fixation of left tibial plateau fracture   [facility]: [facility] ROOM 03 / [facility]   Surgeons: Roby Lofts, MD       DISCUSSION: Patient is a 77 year old female scheduled for above procedure. She is s/p ORIF left tibial plateau fracture on 07/30/22.   History includes never smoker, HTN, HLD, DM2, hypothyroidism, CVA (2016), carotid stenosis (1-39% RICA 10/2018), GERD, OSA (uses CPAP), fibromyalgia, left tibial plateau fracture (s/p ORIF 07/30/22), DVT (LLE 08/02/22, post-op).  [place] admission 07/29/22 - 08/04/22 following fall with comminuted fracture of the both medial and lateral tibial plateau predominantly involving the medial tibial plateau (Schatzker type 5). S/p ORIF left bicondylar tibial plateau fracture on 07/30/2022. + LLE DVT 08/02/22 and was started on Eliquis for at least 3 months. 2 week follow-up with ortho for wound check in repeat imaging.  Eliquis last dose 08/30/22 per Dr. Jena Gauss.   Last cardiology visit 10/19/21 with Jacolyn Reedy, PA-C. She had a low risk stress test in 2022. Echo 10/2021 showed LVEF 60-65%, no RWMA, grade 1 DD, normal RVSF, normal PASP, RVSP 23.2 mmHg, trivial MR/AI.  A1c 7.2% 07/29/22. On Humalog 0-10 units SSI TID with meals, Toujeo 30 units daily, Mounjaro 5 mg weekly. Reported Mounjaro last dose 08/26/22.   Same day work-up. Anesthesia team to evaluate on the day of surgery.   VS:  BP Readings from Last 3 Encounters:  08/04/22 127/62  02/18/22 120/70  10/19/21 122/78   Pulse Readings from Last 3 Encounters:  08/04/22 96  02/18/22 63  10/19/21 71    PROVIDERS: Geoffry Paradise, MD is PCP  Nona Dell, MD is cardiologist Jetty Duhamel, MD is pulmonologist. Last dose 02/18/22.    LABS: Most recent lab results in [facility]  include: Lab Results  Component Value Date   WBC 7.8 08/04/2022   HGB 8.5 (L) 08/04/2022   HCT 26.1 (L) 08/04/2022   PLT 251 08/04/2022   GLUCOSE 245 (H) 08/04/2022   NA 138 08/04/2022   K 3.9 08/04/2022   CL 106 08/04/2022   CREATININE 0.64 08/04/2022   BUN 8 08/04/2022   CO2 23 08/04/2022   HGBA1C 7.2 (H) 07/29/2022   H/H on admission 07/29/22 11.3/35.1. S/p ORIF left tibia plateau 07/29/21.   PFTs 02/17/21: FVC 2.78 (99%), post 2.83 (101%). FEV1 2.12 (100%), post 2.31 (110%). DLCO unc/cor 20.77 (110%).   IMAGES: Xray left knee (post-op) 07/30/22: IMPRESSION: ORIF of comminuted proximal tibial fracture, in improved alignment from preoperative imaging.   CT Left knee 07/29/22: IMPRESSION: 1. Comminuted fracture of the both medial and lateral tibial plateau predominantly involving the medial tibial plateau (Schatzker type 5), as detailed above. 2. The fracture involves the tibial spine about the insertion of the cruciate ligaments concerning for ligamentous instability. 3. Moderate suprapatellar joint effusion with fat fluid levels.  1V CXR 07/29/22: FINDINGS: The heart size and mediastinal contours are within normal limits. Both lungs are clear. The visualized skeletal structures are unremarkable. IMPRESSION: No active disease.    EKG: 07/29/22: Sinus rhythm Prolonged PR interval LAD, consider left anterior fascicular block Low voltage, precordial leads Abnormal R-wave progression, late transition Borderline T abnormalities, anterior leads Confirmed by Eber Hong ([id number]) on 07/29/2022 2:58:46 PM   CV: LLE Venous Duplex 08/02/22: Summary:  LEFT:  - Findings consistent  with acute deep vein thrombosis involving the left  peroneal veins.  - No cystic structure found in the popliteal fossa.   Echo 10/28/21: IMPRESSIONS   1. Left ventricular ejection fraction, by estimation, is 60 to 65%. The  left ventricle has normal function. The left ventricle has no regional   wall motion abnormalities. Left ventricular diastolic parameters are  consistent with Grade I diastolic  dysfunction (impaired relaxation).   2. Right ventricular systolic function is normal. The right ventricular  size is normal. There is normal pulmonary artery systolic pressure. The  estimated right ventricular systolic pressure is 23.2 mmHg.   3. The mitral valve is grossly normal. Trivial mitral valve  regurgitation. No evidence of mitral stenosis.   4. The aortic valve is tricuspid. There is mild thickening of the aortic  valve. Aortic valve regurgitation is trivial. No aortic stenosis is  present.   5. The inferior vena cava is normal in size with greater than 50%  respiratory variability, suggesting right atrial pressure of 3 mmHg.  - Comparison(s): Changes from prior study are noted. AI is trivial on this  study. Suspect trivial to mild on the last study.    Nuclear stress test 04/02/20: There was no ST segment deviation noted during stress. The study is normal. There are no perfusion defects consistent with prior infarct or current ischemia. This is a low risk study. The left ventricular ejection fraction is normal (55-65%).    US Carotid 10/31/18: Summary:  Right Carotid: Velocities in the right ICA are consistent with a 1-39%  stenosis.  Left Carotid: The extracranial vessels were near-normal with only minimal  wall thickening or plaque.  Vertebrals:  Bilateral vertebral arteries demonstrate antegrade flow.  Subclavians: Normal flow hemodynamics were seen in bilateral subclavian               arteries.   Past Medical History:  Diagnosis Date   Anxiety    Depression    Essential hypertension    Eustachian tube dysfunction    Fibromyalgia    GERD (gastroesophageal reflux disease)    Hyperlipidemia    Hypothyroid    Mild carotid artery disease (HCC)    OSA (obstructive sleep apnea)    Osteopenia    Stroke (HCC)    Type 2 diabetes mellitus (HCC)     Past  Surgical History:  Procedure Laterality Date   ANKLE SURGERY     Anteriorvesicourethropexy     BUNIONECTOMY     COLONOSCOPY     NASAL SINUS SURGERY     ORIF TIBIA PLATEAU Left 07/30/2022   Procedure: OPEN REDUCTION INTERNAL FIXATION (ORIF) TIBIAL PLATEAU;  Surgeon: Roby Lofts, MD;  Location: [facility];  Service: Orthopedics;  Laterality: Left;   PATELLA FRACTURE SURGERY  2009   Right   TONSILLECTOMY     TOTAL ABDOMINAL HYSTERECTOMY     WRIST FRACTURE SURGERY  2009    MEDICATIONS: No current facility-administered medications for this encounter.    acetaminophen (TYLENOL) 500 MG tablet   amLODipine (NORVASC) 5 MG tablet   apixaban (ELIQUIS) 5 MG TABS tablet   Biotin w/ Vitamins C & E (HAIR SKIN & NAILS GUMMIES PO)   Cyanocobalamin (B-12) 1000 MCG SUBL   docusate sodium (COLACE) 100 MG capsule   DULoxetine (CYMBALTA) 60 MG capsule   gabapentin (NEURONTIN) 100 MG capsule   hydrOXYzine (ATARAX) 25 MG tablet   insulin lispro (HUMALOG) 100 UNIT/ML injection   irbesartan (AVAPRO) 300 MG tablet   methocarbamol (  ROBAXIN) 500 MG tablet   metoprolol succinate (TOPROL-XL) 50 MG 24 hr tablet   Multiple Vitamins-Minerals (MULTIVITAMIN WITH MINERALS) tablet   omeprazole (PRILOSEC) 20 MG capsule   oxyCODONE (OXY IR/ROXICODONE) 5 MG immediate release tablet   polyethylene glycol (MIRALAX / GLYCOLAX) 17 g packet   rosuvastatin (CRESTOR) 20 MG tablet   tirzepatide (MOUNJARO) 5 MG/0.5ML Pen   TOUJEO SOLOSTAR 300 UNIT/ML Solostar Pen   traZODone (DESYREL) 50 MG tablet   triamterene-hydrochlorothiazide (MAXZIDE-25) 37.5-25 MG tablet   chlorhexidine (HIBICLENS) 4 % external liquid    Shonna Chock, PA-C Surgical Short Stay/Anesthesiology [facility] Phone [phone] [facility] Phone [phone] 09/01/2022 5:02 PM

## 2022-09-01 NOTE — Anesthesia Preprocedure Evaluation (Addendum)
Anesthesia Evaluation  Patient identified by MRN, date of birth, ID band Patient awake    Reviewed: Allergy & Precautions, NPO status , Patient's Chart, lab work & pertinent test results  Airway Mallampati: III  TM Distance: >3 FB Neck ROM: Full    Dental  (+) Dental Advisory Given, Teeth Intact   Pulmonary sleep apnea and Continuous Positive Airway Pressure Ventilation    Pulmonary exam normal breath sounds clear to auscultation       Cardiovascular hypertension (135/63 preop), Pt. on medications + DVT  Normal cardiovascular exam Rhythm:Regular Rate:Normal  S/p ORIF left bicondylar tibial plateau fracture on 07/30/2022. + LLE DVT 08/02/22 and was started on Eliquis for at least 3 months.  Eliquis LD 6/10  Last cardiology visit 10/19/21 with Jacolyn Reedy, PA-C. She had a low risk stress test in 2022. Echo 10/2021 showed LVEF 60-65%, no RWMA, grade 1 DD, normal RVSF, normal PASP, RVSP 23.2 mmHg, trivial MR/AI.   Neuro/Psych  PSYCHIATRIC DISORDERS Anxiety Depression    CVA (2016), No Residual Symptoms    GI/Hepatic Neg liver ROS,GERD  Controlled,,  Endo/Other  diabetes, Well Controlled, Type 2, Oral Hypoglycemic Agents, Insulin DependentHypothyroidism  Mounjaro LD 6/6 Last A1c 7.2 FS 129 preop   Renal/GU negative Renal ROS  negative genitourinary   Musculoskeletal  (+) Arthritis , Osteoarthritis,  Fibromyalgia -s/p ORIF left tibial plateau fracture on 07/30/22.    Abdominal   Peds  Hematology negative hematology ROS (+)   Anesthesia Other Findings   Reproductive/Obstetrics negative OB ROS                              Anesthesia Physical Anesthesia Plan  ASA: 2  Anesthesia Plan: General   Post-op Pain Management: Tylenol PO (pre-op)* and Regional block*   Induction: Intravenous  PONV Risk Score and Plan: 3 and Ondansetron, Dexamethasone, Midazolam and Treatment may vary due to age or  medical condition  Airway Management Planned: LMA  Additional Equipment: None  Intra-op Plan:   Post-operative Plan: Extubation in OR  Informed Consent: I have reviewed the patients History and Physical, chart, labs and discussed the procedure including the risks, benefits and alternatives for the proposed anesthesia with the patient or authorized representative who has indicated his/her understanding and acceptance.     Dental advisory given  Plan Discussed with: CRNA  Anesthesia Plan Comments: (  )        Anesthesia Quick Evaluation

## 2022-09-01 NOTE — H&P (Signed)
[facility] (OTS) H&P  Patient ID: [name] MRN: [id number] DOB/AGE: [birth year] 77 y.o.  Reason for surgery: Loss of fixation/failed hardware left tibial plateau fracture  HPI: [employer] is an 77 y.o. female with past medical history of CVA, hypertension, hyperlipidemia, GERD, OSA presenting for surgery on left lower extremity.  Patient sustained a fall on Jul 29, 2022, resulting in a left tibial plateau fracture.  Patient underwent ORIF of left tibial plateau by Dr. Jena Gauss on 07/30/2022.  She was placed in a hinged knee brace postoperatively and instructed to be nonweightbearing on the left lower extremity.  Patient has been compliant with these weightbearing precautions.  When patient was evaluated at her postop appointment on 07/31/2022, she was found to have some loss of fixation of the fracture.  She presents now for revision of the left tibial plateau Last dose of Eliquis was on 07/31/2022.  Past Medical History:  Diagnosis Date   Anxiety    Depression    DVT (deep venous thrombosis) (HCC) 08/02/2022   LLE 08/02/22   Essential hypertension    Eustachian tube dysfunction    Fibromyalgia    GERD (gastroesophageal reflux disease)    Hyperlipidemia    Hypothyroid    Mild carotid artery disease (HCC)    OSA (obstructive sleep apnea)    Osteopenia    Stroke (HCC)    Type 2 diabetes mellitus (HCC)     Past Surgical History:  Procedure Laterality Date   ANKLE SURGERY     Anteriorvesicourethropexy     BUNIONECTOMY     COLONOSCOPY     NASAL SINUS SURGERY     ORIF TIBIA PLATEAU Left 07/30/2022   Procedure: OPEN REDUCTION INTERNAL FIXATION (ORIF) TIBIAL PLATEAU;  Surgeon: Roby Lofts, MD;  Location: [facility];  Service: Orthopedics;  Laterality: Left;   PATELLA FRACTURE SURGERY  2009   Right   TONSILLECTOMY     TOTAL ABDOMINAL HYSTERECTOMY     WRIST FRACTURE SURGERY  2009    Family History  Problem Relation Age of Onset   Heart attack Mother     Colon cancer Father     Social History:  reports that she has never smoked. She has never used smokeless tobacco. She reports that she does not drink alcohol and does not use drugs.  Allergies: No Known Allergies  Medications: I have reviewed the patient's current medications. Prior to Admission:  No medications prior to admission.    ROS: Constitutional: No fever or chills Vision: No changes in vision ENT: No difficulty swallowing CV: No chest pain Pulm: No SOB or wheezing GI: No nausea or vomiting GU: No urgency or inability to hold urine Skin: Well-healing surgical incision left knee/proximal tibia Neurologic: No numbness or tingling Psychiatric: No depression or anxiety Heme: No bruising Allergic: No reaction to medications or food   Exam: There were no vitals taken for this visit. General: No acute distress Orientation: Alert and oriented x 4 Mood and Affect: Mood and affect appropriate.  Pleasant and cooperative Gait: Nonweightbearing left lower extremity Coordination and balance: Within normal limits  Left lower extremity: Well-healing surgical incisions.  Moderate swelling about the knee and proximal tibia.  Pain with palpation over this area.  No significant calf tenderness.  Tolerates gentle knee range of motion.  Ankle DF/PF intact.  Motor and sensory function intact.  Neurovascularly intact.  Right lower extremity: Skin without lesions. No tenderness to palpation. Full painless ROM, full strength in each muscle group  without evidence of instability.  Motor and sensory function at baseline.  Neurovascularly intact   Medical Decision Making: Data: Imaging: AP and lateral views of the left knee show some interval shifting/displacement of the posterior fracture fragment.  All hardware appears stable, no signs of any hardware failure or loosening.  Labs: No results found for this or any previous visit (from the past 168 hour(s)).   Assessment/Plan: 77 year old  female status post ORIF left tibial plateau 07/30/2022 now with loss of fixation.  I have reviewed current imaging with the patient and her daughter, including her loss of fixation.  We discussed available options including continuing to closely observe the positioning of the fracture versus revision fixation of the tibial plateau.  No concern with the current position of the fracture fragment is is likely lead to significant posttraumatic arthritis as well as continued pain and swelling in the leg and the short-term.  After full discussion of all options, patient has opted to proceed with revision fixation of the left tibial plateau.  Risk and benefits of the procedure have been discussed with the patient.  She agrees to proceed.  Consent will be obtained.  Will plan to keep the patient overnight for observation, pain control, therapies.    Thompson Caul PA-C [facility] [phone] (office) [email or web address]

## 2022-09-02 ENCOUNTER — Inpatient Hospital Stay (HOSPITAL_COMMUNITY): Payer: Medicare HMO | Admitting: Certified Registered"

## 2022-09-02 ENCOUNTER — Other Ambulatory Visit: Payer: Self-pay

## 2022-09-02 ENCOUNTER — Inpatient Hospital Stay (HOSPITAL_COMMUNITY): Payer: Medicare HMO

## 2022-09-02 ENCOUNTER — Encounter (HOSPITAL_COMMUNITY): Payer: Self-pay | Admitting: Student

## 2022-09-02 ENCOUNTER — Inpatient Hospital Stay (HOSPITAL_COMMUNITY)
Admission: RE | Admit: 2022-09-02 | Discharge: 2022-09-03 | DRG: 493 | Disposition: A | Discharge status: Home Health | Payer: Medicare HMO | Attending: Student | Admitting: Student

## 2022-09-02 ENCOUNTER — Encounter (HOSPITAL_COMMUNITY)
Admission: RE | Disposition: A | Discharge status: Home Health | Payer: Self-pay | Source: Home / Self Care | Attending: Student

## 2022-09-02 DIAGNOSIS — Z7984 Long term (current) use of oral hypoglycemic drugs: Secondary | ICD-10-CM | POA: Diagnosis not present

## 2022-09-02 DIAGNOSIS — T84098A Other mechanical complication of other internal joint prosthesis, initial encounter: Principal | ICD-10-CM | POA: Diagnosis present

## 2022-09-02 DIAGNOSIS — Z794 Long term (current) use of insulin: Secondary | ICD-10-CM

## 2022-09-02 DIAGNOSIS — S82142A Displaced bicondylar fracture of left tibia, initial encounter for closed fracture: Secondary | ICD-10-CM | POA: Diagnosis present

## 2022-09-02 DIAGNOSIS — I10 Essential (primary) hypertension: Secondary | ICD-10-CM

## 2022-09-02 DIAGNOSIS — E119 Type 2 diabetes mellitus without complications: Secondary | ICD-10-CM

## 2022-09-02 DIAGNOSIS — Z0389 Encounter for observation for other suspected diseases and conditions ruled out: Secondary | ICD-10-CM | POA: Diagnosis not present

## 2022-09-02 DIAGNOSIS — E039 Hypothyroidism, unspecified: Secondary | ICD-10-CM | POA: Diagnosis present

## 2022-09-02 DIAGNOSIS — Z86718 Personal history of other venous thrombosis and embolism: Secondary | ICD-10-CM | POA: Diagnosis not present

## 2022-09-02 DIAGNOSIS — E785 Hyperlipidemia, unspecified: Secondary | ICD-10-CM | POA: Diagnosis present

## 2022-09-02 DIAGNOSIS — Y838 Other surgical procedures as the cause of abnormal reaction of the patient, or of later complication, without mention of misadventure at the time of the procedure: Secondary | ICD-10-CM | POA: Diagnosis present

## 2022-09-02 DIAGNOSIS — I82452 Acute embolism and thrombosis of left peroneal vein: Secondary | ICD-10-CM | POA: Diagnosis present

## 2022-09-02 DIAGNOSIS — Z8673 Personal history of transient ischemic attack (TIA), and cerebral infarction without residual deficits: Secondary | ICD-10-CM | POA: Diagnosis not present

## 2022-09-02 DIAGNOSIS — T84018A Broken internal joint prosthesis, other site, initial encounter: Secondary | ICD-10-CM | POA: Diagnosis not present

## 2022-09-02 DIAGNOSIS — G8918 Other acute postprocedural pain: Secondary | ICD-10-CM | POA: Diagnosis not present

## 2022-09-02 DIAGNOSIS — Z8249 Family history of ischemic heart disease and other diseases of the circulatory system: Secondary | ICD-10-CM | POA: Diagnosis not present

## 2022-09-02 DIAGNOSIS — M8580 Other specified disorders of bone density and structure, unspecified site: Secondary | ICD-10-CM | POA: Diagnosis present

## 2022-09-02 DIAGNOSIS — Z90710 Acquired absence of both cervix and uterus: Secondary | ICD-10-CM | POA: Diagnosis not present

## 2022-09-02 DIAGNOSIS — K219 Gastro-esophageal reflux disease without esophagitis: Secondary | ICD-10-CM | POA: Diagnosis present

## 2022-09-02 DIAGNOSIS — M797 Fibromyalgia: Secondary | ICD-10-CM | POA: Diagnosis present

## 2022-09-02 DIAGNOSIS — G4733 Obstructive sleep apnea (adult) (pediatric): Secondary | ICD-10-CM | POA: Diagnosis not present

## 2022-09-02 DIAGNOSIS — W19XXXA Unspecified fall, initial encounter: Secondary | ICD-10-CM | POA: Diagnosis present

## 2022-09-02 HISTORY — PX: ORIF TIBIA PLATEAU: SHX2132

## 2022-09-02 HISTORY — DX: Unspecified osteoarthritis, unspecified site: M19.90

## 2022-09-02 HISTORY — DX: Pneumonia, unspecified organism: J18.9

## 2022-09-02 LAB — GLUCOSE, CAPILLARY
Glucose-Capillary: 129 mg/dL — ABNORMAL HIGH (ref 70–99)
Glucose-Capillary: 200 mg/dL — ABNORMAL HIGH (ref 70–99)
Glucose-Capillary: 149 mg/dL — ABNORMAL HIGH (ref 70–99)
Glucose-Capillary: 252 mg/dL — ABNORMAL HIGH (ref 70–99)
Glucose-Capillary: 228 mg/dL — ABNORMAL HIGH (ref 70–99)
Glucose-Capillary: 211 mg/dL — ABNORMAL HIGH (ref 70–99)

## 2022-09-02 LAB — SURGICAL PCR SCREEN
MRSA, PCR: NEGATIVE
Staphylococcus aureus: NEGATIVE

## 2022-09-02 SURGERY — OPEN REDUCTION INTERNAL FIXATION (ORIF) TIBIAL PLATEAU
Anesthesia: General | Site: Leg Lower | Laterality: Left

## 2022-09-02 MED ORDER — DEXAMETHASONE SODIUM PHOSPHATE 10 MG/ML IJ SOLN
INTRAMUSCULAR | Status: AC
  Filled 2022-09-02: qty 1

## 2022-09-02 MED ORDER — CHLORHEXIDINE GLUCONATE 0.12 % MT SOLN
OROMUCOSAL | Status: AC
  Administered 2022-09-02: 15 mL via OROMUCOSAL
  Filled 2022-09-02: qty 15

## 2022-09-02 MED ORDER — HYDROMORPHONE HCL 1 MG/ML IJ SOLN: 0.5000 mg | INTRAMUSCULAR | Status: DC | PRN

## 2022-09-02 MED ORDER — GABAPENTIN 100 MG PO CAPS
100.0000 mg | ORAL_CAPSULE | Freq: Three times a day (TID) | ORAL | Status: DC
  Administered 2022-09-02 – 2022-09-03 (×3): 100 mg via ORAL
  Filled 2022-09-02 (×3): qty 1

## 2022-09-02 MED ORDER — HYDROCODONE-ACETAMINOPHEN 7.5-325 MG PO TABS
1.0000 | ORAL_TABLET | Freq: Once | ORAL | Status: AC | PRN
  Administered 2022-09-02: 1 via ORAL

## 2022-09-02 MED ORDER — AMISULPRIDE (ANTIEMETIC) 5 MG/2ML IV SOLN: 10.0000 mg | Freq: Once | INTRAVENOUS | Status: DC | PRN

## 2022-09-02 MED ORDER — METHOCARBAMOL 1000 MG/10ML IJ SOLN: 500.0000 mg | Freq: Four times a day (QID) | INTRAVENOUS | Status: DC | PRN

## 2022-09-02 MED ORDER — VANCOMYCIN HCL 1000 MG IV SOLR
INTRAVENOUS | Status: AC
  Filled 2022-09-02: qty 20

## 2022-09-02 MED ORDER — PANTOPRAZOLE SODIUM 40 MG PO TBEC
40.0000 mg | DELAYED_RELEASE_TABLET | Freq: Every day | ORAL | Status: DC
  Administered 2022-09-03: 40 mg via ORAL
  Filled 2022-09-02: qty 1

## 2022-09-02 MED ORDER — INSULIN ASPART 100 UNIT/ML IJ SOLN
0.0000 [IU] | Freq: Three times a day (TID) | INTRAMUSCULAR | Status: DC
  Administered 2022-09-02: 5 [IU] via SUBCUTANEOUS
  Administered 2022-09-02: 8 [IU] via SUBCUTANEOUS
  Administered 2022-09-03: 3 [IU] via SUBCUTANEOUS

## 2022-09-02 MED ORDER — HYDROCODONE-ACETAMINOPHEN 7.5-325 MG PO TABS
ORAL_TABLET | ORAL | Status: AC
  Filled 2022-09-02: qty 1

## 2022-09-02 MED ORDER — VITAMIN B-12 1000 MCG PO TABS
1000.0000 ug | ORAL_TABLET | Freq: Every day | ORAL | Status: DC
  Administered 2022-09-02 – 2022-09-03 (×2): 1000 ug via ORAL
  Filled 2022-09-02 (×2): qty 1

## 2022-09-02 MED ORDER — ACETAMINOPHEN 500 MG PO TABS: 1000.0000 mg | ORAL_TABLET | Freq: Once | ORAL | Status: AC

## 2022-09-02 MED ORDER — INSULIN GLARGINE-YFGN 100 UNIT/ML ~~LOC~~ SOLN
30.0000 [IU] | Freq: Every day | SUBCUTANEOUS | Status: DC
  Administered 2022-09-03: 30 [IU] via SUBCUTANEOUS
  Filled 2022-09-02: qty 0.3

## 2022-09-02 MED ORDER — PHENYLEPHRINE 80 MCG/ML (10ML) SYRINGE FOR IV PUSH (FOR BLOOD PRESSURE SUPPORT)
PREFILLED_SYRINGE | INTRAVENOUS | Status: DC | PRN
  Administered 2022-09-02: 80 ug via INTRAVENOUS

## 2022-09-02 MED ORDER — METOCLOPRAMIDE HCL 5 MG/ML IJ SOLN: 5.0000 mg | Freq: Three times a day (TID) | INTRAMUSCULAR | Status: DC | PRN

## 2022-09-02 MED ORDER — METHOCARBAMOL 500 MG PO TABS: 500.0000 mg | ORAL_TABLET | Freq: Four times a day (QID) | ORAL | 0 refills | Status: DC | PRN

## 2022-09-02 MED ORDER — TRAZODONE HCL 50 MG PO TABS
25.0000 mg | ORAL_TABLET | Freq: Every day | ORAL | Status: DC
  Administered 2022-09-02: 25 mg via ORAL
  Filled 2022-09-02: qty 1

## 2022-09-02 MED ORDER — CHLORHEXIDINE GLUCONATE 0.12 % MT SOLN: 15.0000 mL | Freq: Once | OROMUCOSAL | Status: AC

## 2022-09-02 MED ORDER — ONDANSETRON HCL 4 MG/2ML IJ SOLN
INTRAMUSCULAR | Status: AC
  Filled 2022-09-02: qty 2

## 2022-09-02 MED ORDER — FENTANYL CITRATE (PF) 250 MCG/5ML IJ SOLN
INTRAMUSCULAR | Status: AC
  Filled 2022-09-02: qty 5

## 2022-09-02 MED ORDER — LIDOCAINE 2% (20 MG/ML) 5 ML SYRINGE
INTRAMUSCULAR | Status: DC | PRN
  Administered 2022-09-02: 40 mg via INTRAVENOUS

## 2022-09-02 MED ORDER — OXYCODONE HCL 5 MG PO TABS
5.0000 mg | ORAL_TABLET | ORAL | Status: DC | PRN
  Administered 2022-09-02 – 2022-09-03 (×5): 5 mg via ORAL
  Filled 2022-09-02 (×6): qty 1

## 2022-09-02 MED ORDER — VANCOMYCIN HCL IN DEXTROSE 1-5 GM/200ML-% IV SOLN
INTRAVENOUS | Status: AC
  Filled 2022-09-02: qty 200

## 2022-09-02 MED ORDER — ONDANSETRON HCL 4 MG/2ML IJ SOLN: 4.0000 mg | Freq: Four times a day (QID) | INTRAMUSCULAR | Status: DC | PRN

## 2022-09-02 MED ORDER — LACTATED RINGERS IV SOLN: INTRAVENOUS | Status: DC

## 2022-09-02 MED ORDER — SODIUM CHLORIDE 0.9 % IV SOLN: INTRAVENOUS | Status: DC

## 2022-09-02 MED ORDER — PROPOFOL 10 MG/ML IV BOLUS
INTRAVENOUS | Status: AC
  Filled 2022-09-02: qty 20

## 2022-09-02 MED ORDER — HYDROMORPHONE HCL 1 MG/ML IJ SOLN
0.2500 mg | INTRAMUSCULAR | Status: DC | PRN
  Administered 2022-09-02 (×2): 0.25 mg via INTRAVENOUS
  Administered 2022-09-02: 0.5 mg via INTRAVENOUS

## 2022-09-02 MED ORDER — VANCOMYCIN HCL IN DEXTROSE 1-5 GM/200ML-% IV SOLN
1000.0000 mg | INTRAVENOUS | Status: AC
  Administered 2022-09-02: 1000 mg via INTRAVENOUS

## 2022-09-02 MED ORDER — LIDOCAINE 2% (20 MG/ML) 5 ML SYRINGE
INTRAMUSCULAR | Status: AC
  Filled 2022-09-02: qty 5

## 2022-09-02 MED ORDER — PHENYLEPHRINE HCL-NACL 20-0.9 MG/250ML-% IV SOLN
INTRAVENOUS | Status: DC | PRN
  Administered 2022-09-02: 50 ug/min via INTRAVENOUS

## 2022-09-02 MED ORDER — ROSUVASTATIN CALCIUM 20 MG PO TABS: 20.0000 mg | ORAL_TABLET | Freq: Every day | ORAL | Status: DC

## 2022-09-02 MED ORDER — PHENYLEPHRINE 80 MCG/ML (10ML) SYRINGE FOR IV PUSH (FOR BLOOD PRESSURE SUPPORT)
PREFILLED_SYRINGE | INTRAVENOUS | Status: AC
  Filled 2022-09-02: qty 10

## 2022-09-02 MED ORDER — DULOXETINE HCL 60 MG PO CPEP
60.0000 mg | ORAL_CAPSULE | Freq: Every day | ORAL | Status: DC
  Administered 2022-09-03: 60 mg via ORAL
  Filled 2022-09-02: qty 1

## 2022-09-02 MED ORDER — HYDRALAZINE HCL 10 MG PO TABS: 10.0000 mg | ORAL_TABLET | Freq: Four times a day (QID) | ORAL | Status: DC | PRN

## 2022-09-02 MED ORDER — IRBESARTAN 300 MG PO TABS
300.0000 mg | ORAL_TABLET | Freq: Every day | ORAL | Status: DC
  Administered 2022-09-03: 300 mg via ORAL
  Filled 2022-09-02: qty 1

## 2022-09-02 MED ORDER — CEFAZOLIN SODIUM-DEXTROSE 2-4 GM/100ML-% IV SOLN
2.0000 g | Freq: Three times a day (TID) | INTRAVENOUS | Status: AC
  Administered 2022-09-02 – 2022-09-03 (×3): 2 g via INTRAVENOUS
  Filled 2022-09-02 (×3): qty 100

## 2022-09-02 MED ORDER — ACETAMINOPHEN 500 MG PO TABS
ORAL_TABLET | ORAL | Status: AC
  Administered 2022-09-02: 1000 mg via ORAL
  Filled 2022-09-02: qty 2

## 2022-09-02 MED ORDER — ORAL CARE MOUTH RINSE: 15.0000 mL | Freq: Once | OROMUCOSAL | Status: AC

## 2022-09-02 MED ORDER — DIPHENHYDRAMINE HCL 12.5 MG/5ML PO ELIX
12.5000 mg | ORAL_SOLUTION | ORAL | Status: DC | PRN
  Administered 2022-09-02 – 2022-09-03 (×4): 25 mg via ORAL
  Filled 2022-09-02 (×4): qty 10

## 2022-09-02 MED ORDER — ROPIVACAINE HCL 5 MG/ML IJ SOLN
INTRAMUSCULAR | Status: DC | PRN
  Administered 2022-09-02: 30 mL via PERINEURAL

## 2022-09-02 MED ORDER — CEFAZOLIN SODIUM-DEXTROSE 2-4 GM/100ML-% IV SOLN
INTRAVENOUS | Status: AC
  Filled 2022-09-02: qty 100

## 2022-09-02 MED ORDER — ONDANSETRON HCL 4 MG PO TABS: 4.0000 mg | ORAL_TABLET | Freq: Four times a day (QID) | ORAL | Status: DC | PRN

## 2022-09-02 MED ORDER — PROPOFOL 10 MG/ML IV BOLUS
INTRAVENOUS | Status: DC | PRN
  Administered 2022-09-02: 150 mg via INTRAVENOUS

## 2022-09-02 MED ORDER — HYDROMORPHONE HCL 1 MG/ML IJ SOLN
INTRAMUSCULAR | Status: AC
  Filled 2022-09-02: qty 1

## 2022-09-02 MED ORDER — METHOCARBAMOL 500 MG PO TABS
500.0000 mg | ORAL_TABLET | Freq: Four times a day (QID) | ORAL | Status: DC | PRN
  Administered 2022-09-03: 500 mg via ORAL
  Filled 2022-09-02: qty 1

## 2022-09-02 MED ORDER — INSULIN GLARGINE (1 UNIT DIAL) 300 UNIT/ML ~~LOC~~ SOPN: 30.0000 [IU] | PEN_INJECTOR | Freq: Every day | SUBCUTANEOUS | Status: DC

## 2022-09-02 MED ORDER — METOPROLOL SUCCINATE ER 25 MG PO TB24
50.0000 mg | ORAL_TABLET | Freq: Every day | ORAL | Status: DC
  Administered 2022-09-02: 50 mg via ORAL
  Filled 2022-09-02: qty 2

## 2022-09-02 MED ORDER — ACETAMINOPHEN 325 MG PO TABS
650.0000 mg | ORAL_TABLET | Freq: Four times a day (QID) | ORAL | Status: DC
  Administered 2022-09-02 – 2022-09-03 (×3): 650 mg via ORAL
  Filled 2022-09-02 (×3): qty 2

## 2022-09-02 MED ORDER — METOCLOPRAMIDE HCL 5 MG PO TABS: 5.0000 mg | ORAL_TABLET | Freq: Three times a day (TID) | ORAL | Status: DC | PRN

## 2022-09-02 MED ORDER — DOCUSATE SODIUM 100 MG PO CAPS
100.0000 mg | ORAL_CAPSULE | Freq: Two times a day (BID) | ORAL | Status: DC
  Administered 2022-09-02 – 2022-09-03 (×2): 100 mg via ORAL
  Filled 2022-09-02 (×3): qty 1

## 2022-09-02 MED ORDER — HYDROXYZINE HCL 25 MG PO TABS
25.0000 mg | ORAL_TABLET | Freq: Four times a day (QID) | ORAL | Status: DC | PRN
  Administered 2022-09-02: 25 mg via ORAL
  Filled 2022-09-02 (×2): qty 1

## 2022-09-02 MED ORDER — FENTANYL CITRATE (PF) 250 MCG/5ML IJ SOLN
INTRAMUSCULAR | Status: DC | PRN
  Administered 2022-09-02 (×4): 50 ug via INTRAVENOUS

## 2022-09-02 MED ORDER — DEXAMETHASONE SODIUM PHOSPHATE 10 MG/ML IJ SOLN
INTRAMUSCULAR | Status: DC | PRN
  Administered 2022-09-02: 5 mg via INTRAVENOUS

## 2022-09-02 MED ORDER — ONDANSETRON HCL 4 MG/2ML IJ SOLN
INTRAMUSCULAR | Status: DC | PRN
  Administered 2022-09-02: 4 mg via INTRAVENOUS

## 2022-09-02 MED ORDER — VANCOMYCIN HCL 1000 MG IV SOLR
INTRAVENOUS | Status: DC | PRN
  Administered 2022-09-02: 1000 mg via TOPICAL

## 2022-09-02 MED ORDER — ONDANSETRON HCL 4 MG/2ML IJ SOLN: 4.0000 mg | Freq: Once | INTRAMUSCULAR | Status: DC | PRN

## 2022-09-02 MED ORDER — EPHEDRINE 5 MG/ML INJ
INTRAVENOUS | Status: AC
  Filled 2022-09-02: qty 5

## 2022-09-02 MED ORDER — OXYCODONE HCL 5 MG PO TABS: 5.0000 mg | ORAL_TABLET | ORAL | 0 refills | Status: DC | PRN

## 2022-09-02 MED ORDER — B-12 1000 MCG SL SUBL: 1000.0000 ug | SUBLINGUAL_TABLET | Freq: Every day | SUBLINGUAL | Status: DC

## 2022-09-02 MED ORDER — CEFAZOLIN SODIUM-DEXTROSE 2-4 GM/100ML-% IV SOLN
2.0000 g | INTRAVENOUS | Status: AC
  Administered 2022-09-02: 2 g via INTRAVENOUS

## 2022-09-02 MED ORDER — 0.9 % SODIUM CHLORIDE (POUR BTL) OPTIME
TOPICAL | Status: DC | PRN
  Administered 2022-09-02: 1000 mL

## 2022-09-02 MED ORDER — DEXAMETHASONE SODIUM PHOSPHATE 10 MG/ML IJ SOLN
INTRAMUSCULAR | Status: DC | PRN
  Administered 2022-09-02: 10 mg

## 2022-09-02 MED ORDER — ROCURONIUM BROMIDE 10 MG/ML (PF) SYRINGE
PREFILLED_SYRINGE | INTRAVENOUS | Status: AC
  Filled 2022-09-02: qty 10

## 2022-09-02 MED ORDER — TRIAMTERENE-HCTZ 37.5-25 MG PO TABS: 1.0000 | ORAL_TABLET | Freq: Every day | ORAL | Status: DC

## 2022-09-02 MED ORDER — POLYETHYLENE GLYCOL 3350 17 G PO PACK: 17.0000 g | PACK | Freq: Every day | ORAL | Status: DC | PRN

## 2022-09-02 MED ORDER — INSULIN ASPART 100 UNIT/ML IJ SOLN
0.0000 [IU] | INTRAMUSCULAR | Status: DC | PRN
  Administered 2022-09-02: 4 [IU] via SUBCUTANEOUS

## 2022-09-02 MED ORDER — AMLODIPINE BESYLATE 5 MG PO TABS
5.0000 mg | ORAL_TABLET | Freq: Every day | ORAL | Status: DC
  Filled 2022-09-02: qty 1

## 2022-09-02 SURGICAL SUPPLY — 87 items
APL PRP STRL LF DISP 70% ISPRP (MISCELLANEOUS) ×1
BAG COUNTER SPONGE SURGICOUNT (BAG) ×1 IMPLANT
BAG SPNG CNTER NS LX DISP (BAG) ×1
BANDAGE ESMARK 6X9 LF (GAUZE/BANDAGES/DRESSINGS) ×1 IMPLANT
BIT DRILL 2.5MM SMALL QC EVOS (BIT) IMPLANT
BIT DRILL QC 2.5MM SHRT EVO SM (DRILL) IMPLANT
BLADE CLIPPER SURG (BLADE) IMPLANT
BLADE SURG 15 STRL LF DISP TIS (BLADE) ×1 IMPLANT
BLADE SURG 15 STRL SS (BLADE) ×1
BNDG CMPR 5X4 KNIT ELC UNQ LF (GAUZE/BANDAGES/DRESSINGS) ×1
BNDG CMPR 5X62 HK CLSR LF (GAUZE/BANDAGES/DRESSINGS) ×1
BNDG CMPR 6 X 5 YARDS HK CLSR (GAUZE/BANDAGES/DRESSINGS) ×1
BNDG CMPR 9X6 STRL LF SNTH (GAUZE/BANDAGES/DRESSINGS) ×1
BNDG ELASTIC 4INX 5YD STR LF (GAUZE/BANDAGES/DRESSINGS) IMPLANT
BNDG ELASTIC 4X5.8 VLCR STR LF (GAUZE/BANDAGES/DRESSINGS) ×1 IMPLANT
BNDG ELASTIC 6INX 5YD STR LF (GAUZE/BANDAGES/DRESSINGS) IMPLANT
BNDG ELASTIC 6X5.8 VLCR STR LF (GAUZE/BANDAGES/DRESSINGS) ×1 IMPLANT
BNDG ESMARK 6X9 LF (GAUZE/BANDAGES/DRESSINGS) ×1
BNDG GAUZE DERMACEA FLUFF 4 (GAUZE/BANDAGES/DRESSINGS) ×1 IMPLANT
BNDG GZE DERMACEA 4 6PLY (GAUZE/BANDAGES/DRESSINGS)
BRUSH SCRUB EZ PLAIN DRY (MISCELLANEOUS) ×2 IMPLANT
CANISTER SUCT 3000ML PPV (MISCELLANEOUS) ×1 IMPLANT
CHLORAPREP W/TINT 26 (MISCELLANEOUS) ×2 IMPLANT
COVER SURGICAL LIGHT HANDLE (MISCELLANEOUS) ×1 IMPLANT
CUFF TOURN SGL QUICK 34 (TOURNIQUET CUFF)
CUFF TOURN SGL QUICK 42 (TOURNIQUET CUFF) IMPLANT
CUFF TRNQT CYL 34X4.125X (TOURNIQUET CUFF) ×1 IMPLANT
DRAPE C-ARM 42X72 X-RAY (DRAPES) ×1 IMPLANT
DRAPE C-ARMOR (DRAPES) ×1 IMPLANT
DRAPE ORTHO SPLIT 77X108 STRL (DRAPES) ×2
DRAPE SURG ORHT 6 SPLT 77X108 (DRAPES) ×2 IMPLANT
DRAPE U-SHAPE 47X51 STRL (DRAPES) ×1 IMPLANT
DRESSING MEPILEX FLEX 4X4 (GAUZE/BANDAGES/DRESSINGS) IMPLANT
DRILL 2.5MM SMALL QC EVOS (BIT) ×1
DRILL QC 2.5MM SHORT EVOS SM (DRILL) ×1
DRSG MEPILEX FLEX 4X4 (GAUZE/BANDAGES/DRESSINGS) ×1
DRSG MEPILEX POST OP 4X8 (GAUZE/BANDAGES/DRESSINGS) IMPLANT
ELECT REM PT RETURN 9FT ADLT (ELECTROSURGICAL) ×1
ELECTRODE REM PT RTRN 9FT ADLT (ELECTROSURGICAL) ×1 IMPLANT
GAUZE PAD ABD 8X10 STRL (GAUZE/BANDAGES/DRESSINGS) ×2 IMPLANT
GAUZE SPONGE 4X4 12PLY STRL (GAUZE/BANDAGES/DRESSINGS) ×1 IMPLANT
GLOVE BIO SURGEON STRL SZ 6.5 (GLOVE) ×3 IMPLANT
GLOVE BIO SURGEON STRL SZ7.5 (GLOVE) ×4 IMPLANT
GLOVE BIOGEL PI IND STRL 6.5 (GLOVE) ×1 IMPLANT
GLOVE BIOGEL PI IND STRL 7.5 (GLOVE) ×1 IMPLANT
GOWN STRL REUS W/ TWL LRG LVL3 (GOWN DISPOSABLE) ×2 IMPLANT
GOWN STRL REUS W/TWL LRG LVL3 (GOWN DISPOSABLE) ×2
IMMOBILIZER KNEE 22 UNIV (SOFTGOODS) ×1 IMPLANT
K-WIRE 1.6 (WIRE) ×3
K-WIRE FX150X1.6XTROC PNT (WIRE) ×3
KIT BASIN OR (CUSTOM PROCEDURE TRAY) ×1 IMPLANT
KIT TURNOVER KIT B (KITS) ×1 IMPLANT
KWIRE FX150X1.6XTROC PNT (WIRE) IMPLANT
NDL SUT 6 .5 CRC .975X.05 MAYO (NEEDLE) ×1 IMPLANT
NEEDLE MAYO TAPER (NEEDLE)
NS IRRIG 1000ML POUR BTL (IV SOLUTION) ×1 IMPLANT
PACK TOTAL JOINT (CUSTOM PROCEDURE TRAY) ×1 IMPLANT
PAD ARMBOARD 7.5X6 YLW CONV (MISCELLANEOUS) ×2 IMPLANT
PAD CAST 4YDX4 CTTN HI CHSV (CAST SUPPLIES) ×1 IMPLANT
PADDING CAST COTTON 4X4 STRL (CAST SUPPLIES) ×1
PADDING CAST COTTON 6X4 STRL (CAST SUPPLIES) ×1 IMPLANT
PLATE TIB EVOX 3.5X78 5H LT (Plate) IMPLANT
SCREW CORT EVOS ST 3.5X28 (Screw) IMPLANT
SCREW CORT ST EVOS 3.5X46 (Screw) IMPLANT
SCREW CTX 3.5X48MM EVOS (Screw) IMPLANT
SCREW CTX 3.5X50MM EVOS (Screw) IMPLANT
SCREW LOCK 46X3.5XST STRL (Screw) IMPLANT
SCREW LOCK ST 3.5X46 (Screw) ×1 IMPLANT
SCREW LOCK ST EVOS 3.5X40 (Screw) IMPLANT
STAPLER VISISTAT 35W (STAPLE) ×1 IMPLANT
SUCTION TUBE FRAZIER 10FR DISP (SUCTIONS) ×1 IMPLANT
SUT ETHILON 2 0 FS 18 (SUTURE) ×1 IMPLANT
SUT ETHILON 3 0 PS 1 (SUTURE) IMPLANT
SUT FIBERWIRE #2 38 T-5 BLUE (SUTURE)
SUT VIC AB 0 CT1 27 (SUTURE) ×2
SUT VIC AB 0 CT1 27XBRD ANBCTR (SUTURE) IMPLANT
SUT VIC AB 1 CT1 18XCR BRD 8 (SUTURE) IMPLANT
SUT VIC AB 1 CT1 27 (SUTURE)
SUT VIC AB 1 CT1 27XBRD ANBCTR (SUTURE) ×1 IMPLANT
SUT VIC AB 1 CT1 8-18 (SUTURE)
SUT VIC AB 2-0 CT1 (SUTURE) IMPLANT
SUT VIC AB 2-0 CT1 27 (SUTURE)
SUT VIC AB 2-0 CT1 TAPERPNT 27 (SUTURE) ×2 IMPLANT
SUTURE FIBERWR #2 38 T-5 BLUE (SUTURE) IMPLANT
TOWEL GREEN STERILE (TOWEL DISPOSABLE) ×2 IMPLANT
TRAY FOLEY MTR SLVR 16FR STAT (SET/KITS/TRAYS/PACK) IMPLANT
WATER STERILE IRR 1000ML POUR (IV SOLUTION) ×2 IMPLANT

## 2022-09-02 NOTE — Anesthesia Procedure Notes (Signed)
Procedure Name: LMA Insertion Date/Time: 09/02/2022 7:48 AM  Performed by: Alwyn Ren, CRNAPre-anesthesia Checklist: Patient identified, Emergency Drugs available, Suction available, Patient being monitored and Timeout performed Oxygen Delivery Method: Circle system utilized Preoxygenation: Pre-oxygenation with 100% oxygen Induction Type: IV induction LMA: LMA inserted LMA Size: 4.0 Number of attempts: 1 Placement Confirmation: positive ETCO2 and breath sounds checked- equal and bilateral Tube secured with: Tape

## 2022-09-02 NOTE — Discharge Instructions (Signed)
Orthopaedic Trauma Service Discharge Instructions   General Discharge Instructions  WEIGHT BEARING STATUS:non-weightbearing left lower extremity  RANGE OF MOTION/ACTIVITY: ok for knee motion as tolerated, do not need hinge brace  Wound Care: You may remove your surgical dressing on post-op day 2, (Saturday 09/04/22). Incisions can be left open to air if there is no drainage. Once the incision is completely dry and without drainage, it may be left open to air out.  Showering may begin post-op day 3, (Sunday 09/05/22).  Clean incision gently with soap and water.  DVT/PE prophylaxis:  Eliquis  Diet: as you were eating previously.  Can use over the counter stool softeners and bowel preparations, such as Miralax, to help with bowel movements.  Narcotics can be constipating.  Be sure to drink plenty of fluids  PAIN MEDICATION USE AND EXPECTATIONS  You have likely been given narcotic medications to help control your pain.  After a traumatic event that results in an fracture (broken bone) with or without surgery, it is ok to use narcotic pain medications to help control one's pain.  We understand that everyone responds to pain differently and each individual patient will be evaluated on a regular basis for the continued need for narcotic medications. Ideally, narcotic medication use should last no more than 6-8 weeks (coinciding with fracture healing).   As a patient it is your responsibility as well to monitor narcotic medication use and report the amount and frequency you use these medications when you come to your office visit.   We would also advise that if you are using narcotic medications, you should take a dose prior to therapy to maximize you participation.  IF YOU ARE ON NARCOTIC MEDICATIONS IT IS NOT PERMISSIBLE TO OPERATE A MOTOR VEHICLE (MOTORCYCLE/CAR/TRUCK/MOPED) OR HEAVY MACHINERY DO NOT MIX NARCOTICS WITH OTHER CNS (CENTRAL NERVOUS SYSTEM) DEPRESSANTS SUCH AS ALCOHOL   STOP SMOKING  OR USING NICOTINE PRODUCTS!!!!  As discussed nicotine severely impairs your body's ability to heal surgical and traumatic wounds but also impairs bone healing.  Wounds and bone heal by forming microscopic blood vessels (angiogenesis) and nicotine is a vasoconstrictor (essentially, shrinks blood vessels).  Therefore, if vasoconstriction occurs to these microscopic blood vessels they essentially disappear and are unable to deliver necessary nutrients to the healing tissue.  This is one modifiable factor that you can do to dramatically increase your chances of healing your injury.    (This means no smoking, no nicotine gum, patches, etc)  DO NOT USE NONSTEROIDAL ANTI-INFLAMMATORY DRUGS (NSAID'S)  Using products such as Advil (ibuprofen), Aleve (naproxen), Motrin (ibuprofen) for additional pain control during fracture healing can delay and/or prevent the healing response.  If you would like to take over the counter (OTC) medication, Tylenol (acetaminophen) is ok.  However, some narcotic medications that are given for pain control contain acetaminophen as well. Therefore, you should not exceed more than 4000 mg of tylenol in a day if you do not have liver disease.  Also note that there are may OTC medicines, such as cold medicines and allergy medicines that my contain tylenol as well.  If you have any questions about medications and/or interactions please ask your doctor/PA or your pharmacist.      ICE AND ELEVATE INJURED/OPERATIVE EXTREMITY  Using ice and elevating the injured extremity above your heart can help with swelling and pain control.  Icing in a pulsatile fashion, such as 20 minutes on and 20 minutes off, can be followed.    Do not place  ice directly on skin. Make sure there is a barrier between to skin and the ice pack.    Using frozen items such as frozen peas works well as the conform nicely to the are that needs to be iced.  USE AN ACE WRAP OR TED HOSE FOR SWELLING CONTROL  In addition to  icing and elevation, Ace wraps or TED hose are used to help limit and resolve swelling.  It is recommended to use Ace wraps or TED hose until you are informed to stop.    When using Ace Wraps start the wrapping distally (farthest away from the body) and wrap proximally (closer to the body)   Example: If you had surgery on your leg or thing and you do not have a splint on, start the ace wrap at the toes and work your way up to the thigh        If you had surgery on your upper extremity and do not have a splint on, start the ace wrap at your fingers and work your way up to the upper arm    CALL THE OFFICE WITH ANY QUESTIONS OR CONCERNS: [phone]   VISIT OUR WEBSITE FOR ADDITIONAL INFORMATION: [email or web address]     Discharge Wound Care Instructions  Do NOT apply any ointments, solutions or lotions to pin sites or surgical wounds.  These prevent needed drainage and even though solutions like hydrogen peroxide kill bacteria, they also damage cells lining the pin sites that help fight infection.  Applying lotions or ointments can keep the wounds moist and can cause them to breakdown and open up as well. This can increase the risk for infection. When in doubt call the office.  Surgical incisions should be dressed daily.  If any drainage is noted, use one layer of adaptic or Mepitel, then gauze, Kerlix, and an ace wrap. - These dressing supplies should be available at local medical supply stores [facility], [facility], etc) as well as Insurance claims handler ([employer], [facility], [place], etc)  Once the incision is completely dry and without drainage, it may be left open to air out.  Showering may begin 36-48 hours later.  Cleaning gently with soap and water.  Traumatic wounds should be dressed daily as well.    One layer of adaptic, gauze, Kerlix, then ace wrap.  The adaptic can be discontinued once the draining has ceased    If you have a wet to dry dressing: wet the gauze with saline the  squeeze as much saline out so the gauze is moist (not soaking wet), place moistened gauze over wound, then place a dry gauze over the moist one, followed by Kerlix wrap, then ace wrap.

## 2022-09-02 NOTE — Anesthesia Procedure Notes (Signed)
Anesthesia Regional Block: Adductor canal block   Pre-Anesthetic Checklist: , timeout performed,  Correct Patient, Correct Site, Correct Laterality,  Correct Procedure, Correct Position, site marked,  Risks and benefits discussed,  Surgical consent,  Pre-op evaluation,  At surgeon's request and post-op pain management  Laterality: Left  Prep: Maximum Sterile Barrier Precautions used, chloraprep       Needles:  Injection technique: Single-shot  Needle Type: Echogenic Stimulator Needle     Needle Length: 9cm  Needle Gauge: 22     Additional Needles:   Procedures:,,,, ultrasound used (permanent image in chart),,    Narrative:  Start time: 09/02/2022 7:00 AM End time: 09/02/2022 7:05 AM Injection made incrementally with aspirations every 5 mL.  Performed by: Personally  Anesthesiologist: Lannie Fields, DO  Additional Notes: Monitors applied. No increased pain on injection. No increased resistance to injection. Injection made in 5cc increments. Good needle visualization. Patient tolerated procedure well.

## 2022-09-02 NOTE — Op Note (Signed)
Orthopaedic Surgery Operative Note (CSN: [id number] ) Date of Surgery: 09/02/2022  Admit Date: 09/02/2022   Diagnoses: Pre-Op Diagnoses: Failed fixation of left proximal tibia  Post-Op Diagnosis: Same  Procedures: CPT 27535-Revision fixation of left proximal medial tibial condyle CPT 20680-Removal of hardware left tibia  Surgeons : Primary: Roby Lofts, MD  Assistant: Ulyses Southward, PA-C  Location: OR 3   Anesthesia: General with regional block   Antibiotics: Ancef 2g and 1gm vancomycin powder preop with 1 gm vancomycin powder placed topically   Tourniquet time: None    Estimated Blood Loss: 75 mL  Complications:None   Specimens:None   Implants: Implant Name Type Inv. Item Serial No. Manufacturer Lot No. LRB No. Used Action  SCREW CTX 3.5X48MM EVOS - [id number] Screw SCREW CTX 3.5X48MM EVOS  [employer]  Left 1 Implanted  P-M "i" PLATE LEFT 5 H Plate   [employer]  Left 1 Implanted  SCREW CORT ST EVOS 3.5X46 - [id number] Screw SCREW CORT ST EVOS 3.5X46  [employer]  Left 1 Implanted  SCREW CTX 3.5X50MM EVOS - [id number] Screw SCREW CTX 3.5X50MM EVOS  [employer]  Left 1 Implanted  SCREW LOCK ST EVOS 3.5X40 - [id number] Screw SCREW LOCK ST EVOS 3.5X40  [employer]  Left 1 Implanted  SCREW CORT EVOS ST 3.5X28 - [id number] Screw SCREW CORT EVOS ST 3.5X28  [employer]  Left 1 Implanted  SCREW LOCK ST 3.5X46 - [id number] Screw SCREW LOCK ST 3.5X46  [employer]  Left 1 Implanted     Indications for Surgery: 77 year old female who sustained a bicondylar tibial plateau that underwent open reduction internal fixation on Jul 30, 2022.  She presented to the office with failed fixation of her posterior medial fragment of her proximal tibia.  Her femoral condyles were subluxated with this fragment.  Due to the subluxation and displacement I felt that she was  indicated for revision fixation of the medial proximal tibial condyle.  Risk and benefits were discussed with the patient and her daughter.  Risks include but not limited to bleeding, infection, stiffness, failed fixation, nerve or blood vessel injury, hardware failure, even the possibility anesthetic complications.  They agreed to proceed with surgery and consent was obtained.  Operative Findings: Removal of anterior to posterior 3.5 millimeter screw from medial tibial condyle Revision fixation of posterior medial tibial condyle using [employer] posterior medial proximal tibial locking plate  Procedure: The patient was identified in the preoperative holding area. Consent was confirmed with the patient and their family and all questions were answered. The operative extremity was marked after confirmation with the patient. she was then brought back to the operating room by our anesthesia colleagues.  She was carefully transferred over to radiolucent flattop table.  She was placed under general anesthetic.  A bump was placed under her operative hip.  The left lower extremity was then prepped and draped in usual sterile fashion.  Timeout was performed to verify the patient, the procedure, and the extremity.  Preoperative antibiotics were dosed.  Fluoroscopic imaging showed the displacement of the posterior medial fragment as well as the significant displacement of the articular surface and the subluxation of the femoral condyles.  I reopened the previous surgical incision carried it down through skin and subcutaneous tissue.  I kept the previous medial plate in place as it was holding the anterior medial fragment to the metaphysis.  I then worked  to remove the previous anterior to posterior 3.5 millimeter screw that was partially holding the posterior medial fragment.  I then released portion of the musculature off of the posterior medial aspect of the proximal tibia.  I then entered the posterior medial  fragment and cleared out the callus and healing that had occurred.  With assistance I extended the knee and then was able to manipulate the posterior medial fragment back into reduction and held it with a reduction tenaculum.  Once I confirmed reduction I then proceeded to place a 3.5 millimeter screw from anterior to posterior and reinforced this with 1.6 mm K wires from anterior to posterior as well.  Reduction held and proceeded to place a [employer] Nephew posterior medial proximal tibial locking plate and positioned this along the posterior medial aspect to buttress this fragment.  I held it provisionally with a K wire and confirmed adequate positioning and then placed a nonlocking screw distal to the apex of the fracture buttressing the fragment in place.  I placed another screw proximal to this to reinforce the buttressing effect of the plate.  I then placed a locking screw into the fragment to reinforce the fixation.  Another screw was placed into the tibial shaft.  Final fluoroscopic imaging was obtained.  The incisions were copiously irrigated.  A gram of vancomycin powder was placed into the incision.  A layered closure of 0 Vicryl, 2-0 Vicryl and 3-0 nylon was used to close the skin.  Sterile dressing was applied.  The patient was then awoke from anesthesia and taken to the PACU in stable condition.  Post Op Plan/Instructions: Patient will be nonweightbearing to the left lower extremity.  She will receive oral DOAC for DVT prophylaxis.  Will have her mobilize with physical and Occupational Therapy will plan to discharge home tomorrow.  I was present and performed the entire surgery.  Ulyses Southward, PA-C did assist me throughout the case. An assistant was necessary given the difficulty in approach, maintenance of reduction and ability to instrument the fracture.   Truitt Merle, MD [facility]

## 2022-09-02 NOTE — Transfer of Care (Signed)
Immediate Anesthesia Transfer of Care Note  Patient: [name]  Procedure(s) Performed: REVISION FIXATION OF LEFT TIBIAL PLATEAU FRACTURE (Left: Leg Lower)  Patient Location: PACU  Anesthesia Type:General  Level of Consciousness: awake, alert , and oriented  Airway & Oxygen Therapy: Patient Spontanous Breathing  Post-op Assessment: Report given to RN and Post -op Vital signs reviewed and stable  Post vital signs: Reviewed and stable  Last Vitals:  Vitals Value Taken Time  BP 153/77 09/02/22 0923  Temp    Pulse 81 09/02/22 0927  Resp 14 09/02/22 0927  SpO2 95 % 09/02/22 0927  Vitals shown include unvalidated device data.  Last Pain:  Vitals:   09/02/22 0628  TempSrc:   PainSc: 5          Complications: No notable events documented.

## 2022-09-02 NOTE — Anesthesia Postprocedure Evaluation (Signed)
Anesthesia Post Note  Patient: [employer]  Procedure(s) Performed: REVISION FIXATION OF LEFT TIBIAL PLATEAU FRACTURE (Left: Leg Lower)     Patient location during evaluation: PACU Anesthesia Type: General and Regional Level of consciousness: awake and alert, oriented and patient cooperative Pain management: pain level controlled Vital Signs Assessment: post-procedure vital signs reviewed and stable Respiratory status: spontaneous breathing, nonlabored ventilation and respiratory function stable Cardiovascular status: blood pressure returned to baseline and stable Postop Assessment: no apparent nausea or vomiting Anesthetic complications: no  No notable events documented.  Last Vitals:  Vitals:   09/02/22 1030 09/02/22 1045  BP: 121/61 126/63  Pulse: 75 82  Resp: 11 15  Temp:    SpO2: 96% 95%    Last Pain:  Vitals:   09/02/22 1030  TempSrc:   PainSc: Asleep                 Lannie Fields

## 2022-09-02 NOTE — Interval H&P Note (Signed)
History and Physical Interval Note:  09/02/2022 7:33 AM  [name]  has presented today for surgery, with the diagnosis of Failed fixation of left tibial plateau fracture.  The various methods of treatment have been discussed with the patient and family. After consideration of risks, benefits and other options for treatment, the patient has consented to  Procedure(s): REVISION OPEN REDUCTION INTERNAL FIXATION (ORIF) TIBIAL PLATEAU (Left) as a surgical intervention.  The patient's history has been reviewed, patient examined, no change in status, stable for surgery.  I have reviewed the patient's chart and labs.  Questions were answered to the patient's satisfaction.     Caryn Bee P Deaunte Dente

## 2022-09-03 ENCOUNTER — Encounter (HOSPITAL_COMMUNITY): Payer: Self-pay | Admitting: Student

## 2022-09-03 ENCOUNTER — Other Ambulatory Visit: Payer: Self-pay | Admitting: Orthopedic Surgery

## 2022-09-03 DIAGNOSIS — S82102G Unspecified fracture of upper end of left tibia, subsequent encounter for closed fracture with delayed healing: Secondary | ICD-10-CM

## 2022-09-03 LAB — BASIC METABOLIC PANEL
Anion gap: 11 (ref 5–15)
BUN: 11 mg/dL (ref 8–23)
CO2: 22 mmol/L (ref 22–32)
Calcium: 8.7 mg/dL — ABNORMAL LOW (ref 8.9–10.3)
Chloride: 100 mmol/L (ref 98–111)
Creatinine, Ser: 0.81 mg/dL (ref 0.44–1.00)
GFR, Estimated: >60 mL/min (ref >60)
Glucose, Bld: 243 mg/dL — ABNORMAL HIGH (ref 70–99)
Potassium: 3.9 mmol/L (ref 3.5–5.1)
Sodium: 133 mmol/L — ABNORMAL LOW (ref 135–145)

## 2022-09-03 LAB — CBC
HCT: 28.7 % — ABNORMAL LOW (ref 36.0–46.0)
Hemoglobin: 9.5 g/dL — ABNORMAL LOW (ref 12.0–15.0)
MCH: 27.2 pg (ref 26.0–34.0)
MCHC: 33.1 g/dL (ref 30.0–36.0)
MCV: 82.2 fL (ref 80.0–100.0)
Platelets: 252 10*3/uL (ref 150–400)
RBC: 3.49 MIL/uL — ABNORMAL LOW (ref 3.87–5.11)
RDW: 14.6 % (ref 11.5–15.5)
WBC: 10.7 10*3/uL — ABNORMAL HIGH (ref 4.0–10.5)
nRBC: 0 % (ref 0.0–0.2)

## 2022-09-03 LAB — GLUCOSE, CAPILLARY
Glucose-Capillary: 198 mg/dL — ABNORMAL HIGH (ref 70–99)
Glucose-Capillary: 170 mg/dL — ABNORMAL HIGH (ref 70–99)

## 2022-09-03 NOTE — Progress Notes (Signed)
Needed order for standard WC.

## 2022-09-03 NOTE — Progress Notes (Signed)
Pt has been fitted for a wheelchair with 18 x 18 seat, with removeable arms and swing away legrests with elevating feature.  Will need a cushion for the chair.    Samul Dada, PT PhD Acute Rehab Dept. Number: [facility] [id number] and [facility] [phone]

## 2022-09-03 NOTE — TOC Transition Note (Signed)
Transition of Care (TOC) - CM/SW Discharge Note Donn Pierini RN, BSN Transitions of Care Unit 4E- RN Case Manager See Treatment Team for direct phone # Cross Coverage [phone]  Patient Details  Name: [name] MRN: [id number] Date of Birth: [birth year]  Transition of Care [facility]) CM/SW Contact:  Darrold Span, RN Phone Number: 09/03/2022, 2:13 PM   Clinical Narrative:    Pt stable for transition home today, notified by PT that pt needs to exchange her wheelchair out that she recently received from [employer].  "patient has a need for order to get a standard size wheelchair, had an apparent error last visit in which got an order for 16x16 chair. We need standard width and depth" New order has been placed- per pt she has an outside vendor Manufacturing systems engineer) who can fix the issue-   CM has spoken with [employer] liaison regarding need to exchange w/c- liaison to follow up and pull new order from [employer]. Contact info provided for pt.   No further TOC needs noted,    Final next level of care: Home/Self Care Barriers to Discharge: No Barriers Identified   Patient Goals and CMS Choice    Return home  Discharge Placement                 Home        Discharge Plan and Services Additional resources added to the After Visit Summary for       Post Acute Care Choice: Durable Medical Equipment          DME Arranged: Wheelchair manual DME Agency: [employer], [facility] Date DME Agency Contacted: 09/03/22 Time DME Agency Contacted: 1413 Representative spoke with at DME Agency: Earna Coder            Social Determinants of Health (SDOH) Interventions SDOH Screenings   Food Insecurity: No Food Insecurity (09/02/2022)  Housing: Patient Declined (09/02/2022)  Transportation Needs: No Transportation Needs (09/02/2022)  Utilities: Not At Risk (09/02/2022)  Tobacco Use: Low Risk  (09/02/2022)     Readmission Risk Interventions     No data to display

## 2022-09-03 NOTE — Evaluation (Signed)
Occupational Therapy Evaluation Patient Details Name: [name] MRN: [id number] DOB: [birth year] Today's Date: 09/03/2022   History of Present Illness Pt is a 77 y/o female presenting on 6/13 for same day L tibial plateau revision.  Noted fall on 07/29/22 and underwent ORIF to L tibial plateau, but has had some loss of fixation. PMH significant for DM, HTN, HLD, GERD, OSA, stroke, fibromyalgia, DVT L LE.   Clinical Impression   Patient admitted for above and presents with problem list below.  She reports since initial surgery in May, she has been mainly w/c bound, completing transfers, ADLs and w/c mobility with independence (only needing assist for shower transfers from family).  Today, she demonstrates ability to complete ADLs, transfers and mobility in w/c with supervision to modified independent level.  Good adherence to NWB to L LE, but intermittent cueing to lock w/c brakes required.  Educated on techniques to increase ease and improve safety with transfers.  Based on performance today, recommend follow up OT per surgeron; but would likely best benefit from [facility] services after weightbearing status changes.  Will sign off acutely, as pt plans to dc home today.       Recommendations for follow up therapy are one component of a multi-disciplinary discharge planning process, led by the attending physician.  Recommendations may be updated based on patient status, additional functional criteria and insurance authorization.   Assistance Recommended at Discharge Intermittent Supervision/Assistance  Patient can return home with the following A little help with walking and/or transfers;A little help with bathing/dressing/bathroom;Help with stairs or ramp for entrance;Assist for transportation;Assistance with cooking/housework    Functional Status Assessment  Patient has had a recent decline in their functional status and demonstrates the ability to make significant improvements in function in a  reasonable and predictable amount of time.  Equipment Recommendations  None recommended by OT    Recommendations for Other Services       Precautions / Restrictions Precautions Precautions: Fall Restrictions Weight Bearing Restrictions: Yes LLE Weight Bearing: Non weight bearing      Mobility Bed Mobility               General bed mobility comments: OOB in w/c upon entry    Transfers Overall transfer level: Needs assistance Equipment used:  (grabbars) Transfers: Sit to/from Stand, Bed to chair/wheelchair/BSC Sit to Stand: Supervision Stand pivot transfers: Supervision         General transfer comment: supervision to pivot to/from commode in bathroom with grabbar, cueing for technique but no physical assist required.  good adherance to NWB to L LE      Balance Overall balance assessment: Needs assistance Sitting-balance support: No upper extremity supported, Feet supported (only R LE) Sitting balance-Leahy Scale: Good     Standing balance support: Single extremity supported, Bilateral upper extremity supported, During functional activity Standing balance-Leahy Scale: Poor Standing balance comment: relies on UE support due to NWB LLE                           ADL either performed or assessed with clinical judgement   ADL Overall ADL's : Needs assistance/impaired     Grooming: Modified independent;Sitting           Upper Body Dressing : Modified independent;Sitting   Lower Body Dressing: Supervision/safety;Sit to/from stand   Toilet Transfer: Supervision/safety;Stand-pivot;Grab bars Statistician Details (indicate cue type and reason): grabbar to commode pivot from w/c, educated on technique to increase  ease       Tub/Shower Transfer Details (indicate cue type and reason): deferred, pt reports has been having assist at home and is comfortable with technique Functional mobility during ADLs: Wheelchair;Supervision/safety General ADL  Comments: intermittent cueing to lock w/c brakes but doing well overall     Vision   Vision Assessment?: No apparent visual deficits     Perception     Praxis      Pertinent Vitals/Pain Pain Assessment Pain Assessment: Faces Faces Pain Scale: Hurts a little bit Pain Location: L LE surgery Pain Descriptors / Indicators: Discomfort, Operative site guarding Pain Intervention(s): Limited activity within patient's tolerance, Monitored during session     Hand Dominance Right   Extremity/Trunk Assessment Upper Extremity Assessment Upper Extremity Assessment: Overall WFL for tasks assessed   Lower Extremity Assessment Lower Extremity Assessment: Defer to PT evaluation (s/p L LE surgery)       Communication Communication Communication: No difficulties   Cognition Arousal/Alertness: Awake/alert Behavior During Therapy: WFL for tasks assessed/performed Overall Cognitive Status: Within Functional Limits for tasks assessed                                       General Comments       Exercises     Shoulder Instructions      Home Living Family/patient expects to be discharged to:: Private residence Living Arrangements: Spouse/significant other;Children Available Help at Discharge: Family Type of Home: Apartment Home Access: Stairs to enter Secretary/administrator of Steps: 15 Entrance Stairs-Rails: Right;Left;Can reach both Home Layout: One level     Bathroom Shower/Tub: Chief Strategy Officer: Standard     Home Equipment: Engineer, agricultural (2 wheels);BSC/3in1;Shower seat;Tub bench;Grab bars - tub/shower;Wheelchair - manual;Hand held shower head;Grab bars - toilet          Prior Functioning/Environment Prior Level of Function : Independent/Modified Independent             Mobility Comments: pt reports independent with using RW since surgery 5/10, has not been walking just transferring ADLs Comments: pt reports independent  with ADLs, min assist for tub transfers since surgery 5/10        OT Problem List: Decreased activity tolerance;Impaired balance (sitting and/or standing);Pain;Decreased knowledge of precautions;Decreased safety awareness;Decreased knowledge of use of DME or AE      OT Treatment/Interventions:      OT Goals(Current goals can be found in the care plan section) Acute Rehab OT Goals Patient Stated Goal: home OT Goal Formulation: With patient  OT Frequency:      Co-evaluation              AM-PAC OT "6 Clicks" Daily Activity     Outcome Measure Help from another person eating meals?: None Help from another person taking care of personal grooming?: None Help from another person toileting, which includes using toliet, bedpan, or urinal?: A Little Help from another person bathing (including washing, rinsing, drying)?: A Little Help from another person to put on and taking off regular upper body clothing?: None Help from another person to put on and taking off regular lower body clothing?: A Little 6 Click Score: 21   End of Session Nurse Communication: Mobility status;Precautions  Activity Tolerance: Patient tolerated treatment well Patient left: with call bell/phone within reach;Other (comment) (in w/c)  OT Visit Diagnosis: Other abnormalities of gait and mobility (R26.89);Pain Pain - Right/Left: Left Pain -  part of body: Leg                Time: 1610-9604 OT Time Calculation (min): 15 min Charges:  OT General Charges $OT Visit: 1 Visit OT Evaluation $OT Eval Low Complexity: 1 Low  Barry Brunner, OT Acute Rehabilitation Services Office [phone]   Chancy Milroy 09/03/2022, 10:34 AM

## 2022-09-07 DIAGNOSIS — E1165 Type 2 diabetes mellitus with hyperglycemia: Secondary | ICD-10-CM | POA: Diagnosis not present

## 2022-09-07 DIAGNOSIS — S82402D Unspecified fracture of shaft of left fibula, subsequent encounter for closed fracture with routine healing: Secondary | ICD-10-CM | POA: Diagnosis not present

## 2022-09-07 DIAGNOSIS — I2510 Atherosclerotic heart disease of native coronary artery without angina pectoris: Secondary | ICD-10-CM | POA: Diagnosis not present

## 2022-09-07 DIAGNOSIS — W19XXXD Unspecified fall, subsequent encounter: Secondary | ICD-10-CM | POA: Diagnosis not present

## 2022-09-07 DIAGNOSIS — L259 Unspecified contact dermatitis, unspecified cause: Secondary | ICD-10-CM | POA: Diagnosis not present

## 2022-09-07 DIAGNOSIS — I10 Essential (primary) hypertension: Secondary | ICD-10-CM | POA: Diagnosis not present

## 2022-09-07 DIAGNOSIS — S82202D Unspecified fracture of shaft of left tibia, subsequent encounter for closed fracture with routine healing: Secondary | ICD-10-CM | POA: Diagnosis not present

## 2022-09-07 DIAGNOSIS — E785 Hyperlipidemia, unspecified: Secondary | ICD-10-CM | POA: Diagnosis not present

## 2022-09-07 DIAGNOSIS — I82402 Acute embolism and thrombosis of unspecified deep veins of left lower extremity: Secondary | ICD-10-CM | POA: Diagnosis not present

## 2022-09-07 NOTE — Discharge Summary (Signed)
Orthopaedic Trauma Service (OTS) Discharge Summary   Patient ID: [name] MRN: [id number] DOB/AGE: 1945/09/29 77 y.o.  Admit date: 09/02/2022 Discharge date: 09/03/2022  Admission Diagnoses:Failed fixation of left proximal tibia  Discharge Diagnoses:  Principal Problem:   Closed fracture of left tibial plateau   Past Medical History:  Diagnosis Date   Anxiety    Arthritis    Depression    DVT (deep venous thrombosis) (HCC) 08/02/2022   LLE 08/02/22   Essential hypertension    Eustachian tube dysfunction    Fibromyalgia    GERD (gastroesophageal reflux disease)    Hyperlipidemia    Hypothyroid    Mild carotid artery disease (HCC)    OSA (obstructive sleep apnea)    Osteopenia    Pneumonia    Stroke (HCC)    Type 2 diabetes mellitus (HCC)      Procedures Performed:  CPT 27535-Revision fixation of left proximal medial tibial condyle CPT 20680-Removal of hardware left tibia  Discharged Condition: stable  Hospital Course: Patient presented to [facility] on 09/02/2022 for scheduled procedure on left lower extremity.  Single operating room by Dr. Jena Gauss for the above procedure.  She tolerated this well without complications.  Instructed to be nonweightbearing on left lower extremity postoperatively.  Was admitted to 6N postoperatively for pain control and therapies.  Patient evaluated by both physical and Occupational Therapy starting on postoperative day #1.  Was restarted on her Eliquis for DVT prophylaxis starting on postoperative day #1. On 09/03/2022, the patient was tolerating diet, working well with therapies, pain well controlled, vital signs stable, dressings clean, dry, intact and felt stable for discharge to home. Patient will follow up as below and knows to call with questions or concerns.     Consults: None  Significant Diagnostic Studies:   Results for orders placed or performed during the hospital encounter of 09/02/22 (from the past  168 hour(s))  Glucose, capillary   Collection Time: 09/02/22  6:08 AM  Result Value Ref Range   Glucose-Capillary 129 (H) 70 - 99 mg/dL  Surgical pcr screen   Collection Time: 09/02/22  6:30 AM   Specimen: Nasal Mucosa; Nasal Swab  Result Value Ref Range   MRSA, PCR NEGATIVE NEGATIVE   Staphylococcus aureus NEGATIVE NEGATIVE  Glucose, capillary   Collection Time: 09/02/22  8:09 AM  Result Value Ref Range   Glucose-Capillary 200 (H) 70 - 99 mg/dL  Glucose, capillary   Collection Time: 09/02/22  9:25 AM  Result Value Ref Range   Glucose-Capillary 149 (H) 70 - 99 mg/dL  Glucose, capillary   Collection Time: 09/02/22 12:45 PM  Result Value Ref Range   Glucose-Capillary 252 (H) 70 - 99 mg/dL  Glucose, capillary   Collection Time: 09/02/22  4:31 PM  Result Value Ref Range   Glucose-Capillary 228 (H) 70 - 99 mg/dL  Glucose, capillary   Collection Time: 09/02/22 10:18 PM  Result Value Ref Range   Glucose-Capillary 211 (H) 70 - 99 mg/dL  CBC   Collection Time: 09/03/22 12:37 AM  Result Value Ref Range   WBC 10.7 (H) 4.0 - 10.5 K/uL   RBC 3.49 (L) 3.87 - 5.11 MIL/uL   Hemoglobin 9.5 (L) 12.0 - 15.0 g/dL   HCT 32.4 (L) 40.1 - 02.7 %   MCV 82.2 80.0 - 100.0 fL   MCH 27.2 26.0 - 34.0 pg   MCHC 33.1 30.0 - 36.0 g/dL   RDW 25.3 66.4 - 40.3 %   Platelets 252  150 - 400 K/uL   nRBC 0.0 0.0 - 0.2 %  Basic metabolic panel   Collection Time: 09/03/22 12:37 AM  Result Value Ref Range   Sodium 133 (L) 135 - 145 mmol/L   Potassium 3.9 3.5 - 5.1 mmol/L   Chloride 100 98 - 111 mmol/L   CO2 22 22 - 32 mmol/L   Glucose, Bld 243 (H) 70 - 99 mg/dL   BUN 11 8 - 23 mg/dL   Creatinine, Ser 1.61 0.44 - 1.00 mg/dL   Calcium 8.7 (L) 8.9 - 10.3 mg/dL   GFR, Estimated >09 >60 mL/min   Anion gap 11 5 - 15  Glucose, capillary   Collection Time: 09/03/22  8:02 AM  Result Value Ref Range   Glucose-Capillary 198 (H) 70 - 99 mg/dL   Comment 1 Notify RN   Glucose, capillary   Collection Time:  09/03/22 12:00 PM  Result Value Ref Range   Glucose-Capillary 170 (H) 70 - 99 mg/dL   Comment 1 Notify RN      Treatments: IV hydration, antibiotics: Ancef, analgesia: acetaminophen, Dilaudid, and oxycodone, cardiac meds: amlodipine, anticoagulation: Eliquis, therapies: PT and OT, and surgery: as above  Discharge Exam: General: Sitting in bed, no acute distress Respiratory: No increased work of breathing at rest Left lower extremity: Dressing clean, dry, intact..  Tender about the knee as expected.  No significant calf tenderness.  Tolerates gentle knee range of motion.  Ankle dorsiflexion/plantarflexion intact.  Endorses sensation to light touch over all aspects of the foot.  Neurovascularly intact   Disposition: Discharge disposition: 01-Home or Self Care        Allergies as of 09/03/2022       Reactions   Oxycodone Itching        Medication List     STOP taking these medications    chlorhexidine 4 % external liquid Commonly known as: HIBICLENS       TAKE these medications    acetaminophen 500 MG tablet Commonly known as: TYLENOL Take 1 tablet (500 mg total) by mouth every 6 (six) hours as needed.   amLODipine 5 MG tablet Commonly known as: NORVASC Take 5 mg by mouth daily.   apixaban 5 MG Tabs tablet Commonly known as: ELIQUIS Take 1 tablet (5 mg total) by mouth 2 (two) times daily. Start this prescription on 08/09/22 when the starter pack is completed   B-12 1000 MCG Subl Place 1,000 mcg under the tongue daily.   docusate sodium 100 MG capsule Commonly known as: COLACE Take 1 capsule (100 mg total) by mouth 2 (two) times daily. What changed:  when to take this reasons to take this   DULoxetine 60 MG capsule Commonly known as: CYMBALTA Take 60 mg by mouth daily.   gabapentin 100 MG capsule Commonly known as: NEURONTIN Take 100 mg by mouth 3 (three) times daily.   HAIR SKIN & NAILS GUMMIES PO Take 2 each by mouth daily.   hydrOXYzine 25 MG  tablet Commonly known as: ATARAX Take 25 mg by mouth every 6 (six) hours as needed for anxiety.   insulin lispro 100 UNIT/ML injection Commonly known as: HUMALOG Inject 0-10 Units into the skin 3 (three) times daily before meals. Sliding Scale   irbesartan 300 MG tablet Commonly known as: AVAPRO Take 300 mg by mouth daily.   methocarbamol 500 MG tablet Commonly known as: ROBAXIN Take 1 tablet (500 mg total) by mouth every 6 (six) hours as needed for muscle spasms.  metoprolol succinate 50 MG 24 hr tablet Commonly known as: TOPROL-XL Take 50 mg by mouth daily.   multivitamin with minerals tablet Take 1 tablet by mouth daily.   omeprazole 20 MG capsule Commonly known as: PRILOSEC Take 20 mg by mouth daily.   oxyCODONE 5 MG immediate release tablet Commonly known as: Oxy IR/ROXICODONE Take 1 tablet (5 mg total) by mouth every 4 (four) hours as needed for severe pain. What changed: reasons to take this   polyethylene glycol 17 g packet Commonly known as: MIRALAX / GLYCOLAX Take 17 g by mouth daily as needed for mild constipation.   rosuvastatin 20 MG tablet Commonly known as: CRESTOR Take 20 mg by mouth at bedtime.   tirzepatide 5 MG/0.5ML Pen Commonly known as: MOUNJARO Inject 5 mg into the skin once a week.   Toujeo SoloStar 300 UNIT/ML Solostar Pen Generic drug: insulin glargine (1 Unit Dial) Inject 30 Units into the skin daily.   traZODone 50 MG tablet Commonly known as: DESYREL Take 25 mg by mouth at bedtime.   triamterene-hydrochlorothiazide 37.5-25 MG tablet Commonly known as: MAXZIDE-25 Take 1 tablet by mouth daily.        Follow-up Information     Haddix, Gillie Manners, MD. Schedule an appointment as soon as possible for a visit in 2 week(s).   Specialty: Orthopedic Surgery Why: for wound check and repeat xrays Contact information: [address] [place] [address] [phone]                 Discharge Instructions and  Plan: Patient will be discharged to home. Will continue on Eliquis for DVT prophylaxis. Patient has been provided with all the necessary DME for discharge. Patient will follow up with Dr. Jena Gauss in 2 weeks for repeat x-rays and suture removal.   Signed:  Thompson Caul, PA-C ?[phone]? (phone) 09/07/2022, 9:41 AM  [facility] [address] [place] [address] [phone] Collier Bullock (F)

## 2022-09-11 DIAGNOSIS — W19XXXD Unspecified fall, subsequent encounter: Secondary | ICD-10-CM | POA: Diagnosis not present

## 2022-09-11 DIAGNOSIS — S82402D Unspecified fracture of shaft of left fibula, subsequent encounter for closed fracture with routine healing: Secondary | ICD-10-CM | POA: Diagnosis not present

## 2022-09-11 DIAGNOSIS — E1165 Type 2 diabetes mellitus with hyperglycemia: Secondary | ICD-10-CM | POA: Diagnosis not present

## 2022-09-11 DIAGNOSIS — E785 Hyperlipidemia, unspecified: Secondary | ICD-10-CM | POA: Diagnosis not present

## 2022-09-11 DIAGNOSIS — I2510 Atherosclerotic heart disease of native coronary artery without angina pectoris: Secondary | ICD-10-CM | POA: Diagnosis not present

## 2022-09-11 DIAGNOSIS — I10 Essential (primary) hypertension: Secondary | ICD-10-CM | POA: Diagnosis not present

## 2022-09-11 DIAGNOSIS — L259 Unspecified contact dermatitis, unspecified cause: Secondary | ICD-10-CM | POA: Diagnosis not present

## 2022-09-11 DIAGNOSIS — S82202D Unspecified fracture of shaft of left tibia, subsequent encounter for closed fracture with routine healing: Secondary | ICD-10-CM | POA: Diagnosis not present

## 2022-09-11 DIAGNOSIS — I82402 Acute embolism and thrombosis of unspecified deep veins of left lower extremity: Secondary | ICD-10-CM | POA: Diagnosis not present

## 2022-09-14 DIAGNOSIS — E1165 Type 2 diabetes mellitus with hyperglycemia: Secondary | ICD-10-CM | POA: Diagnosis not present

## 2022-09-14 DIAGNOSIS — E785 Hyperlipidemia, unspecified: Secondary | ICD-10-CM | POA: Diagnosis not present

## 2022-09-14 DIAGNOSIS — S82202D Unspecified fracture of shaft of left tibia, subsequent encounter for closed fracture with routine healing: Secondary | ICD-10-CM | POA: Diagnosis not present

## 2022-09-14 DIAGNOSIS — I82402 Acute embolism and thrombosis of unspecified deep veins of left lower extremity: Secondary | ICD-10-CM | POA: Diagnosis not present

## 2022-09-14 DIAGNOSIS — L259 Unspecified contact dermatitis, unspecified cause: Secondary | ICD-10-CM | POA: Diagnosis not present

## 2022-09-14 DIAGNOSIS — W19XXXD Unspecified fall, subsequent encounter: Secondary | ICD-10-CM | POA: Diagnosis not present

## 2022-09-14 DIAGNOSIS — I2510 Atherosclerotic heart disease of native coronary artery without angina pectoris: Secondary | ICD-10-CM | POA: Diagnosis not present

## 2022-09-14 DIAGNOSIS — S82142D Displaced bicondylar fracture of left tibia, subsequent encounter for closed fracture with routine healing: Secondary | ICD-10-CM | POA: Diagnosis not present

## 2022-09-14 DIAGNOSIS — S82402D Unspecified fracture of shaft of left fibula, subsequent encounter for closed fracture with routine healing: Secondary | ICD-10-CM | POA: Diagnosis not present

## 2022-09-14 DIAGNOSIS — S83242D Other tear of medial meniscus, current injury, left knee, subsequent encounter: Secondary | ICD-10-CM | POA: Diagnosis not present

## 2022-09-14 DIAGNOSIS — I10 Essential (primary) hypertension: Secondary | ICD-10-CM | POA: Diagnosis not present

## 2022-09-20 DIAGNOSIS — W19XXXD Unspecified fall, subsequent encounter: Secondary | ICD-10-CM | POA: Diagnosis not present

## 2022-09-20 DIAGNOSIS — E785 Hyperlipidemia, unspecified: Secondary | ICD-10-CM | POA: Diagnosis not present

## 2022-09-20 DIAGNOSIS — E1165 Type 2 diabetes mellitus with hyperglycemia: Secondary | ICD-10-CM | POA: Diagnosis not present

## 2022-09-20 DIAGNOSIS — S82402D Unspecified fracture of shaft of left fibula, subsequent encounter for closed fracture with routine healing: Secondary | ICD-10-CM | POA: Diagnosis not present

## 2022-09-20 DIAGNOSIS — I82402 Acute embolism and thrombosis of unspecified deep veins of left lower extremity: Secondary | ICD-10-CM | POA: Diagnosis not present

## 2022-09-20 DIAGNOSIS — L259 Unspecified contact dermatitis, unspecified cause: Secondary | ICD-10-CM | POA: Diagnosis not present

## 2022-09-20 DIAGNOSIS — S82202D Unspecified fracture of shaft of left tibia, subsequent encounter for closed fracture with routine healing: Secondary | ICD-10-CM | POA: Diagnosis not present

## 2022-09-20 DIAGNOSIS — I2510 Atherosclerotic heart disease of native coronary artery without angina pectoris: Secondary | ICD-10-CM | POA: Diagnosis not present

## 2022-09-20 DIAGNOSIS — I10 Essential (primary) hypertension: Secondary | ICD-10-CM | POA: Diagnosis not present

## 2022-09-23 ENCOUNTER — Other Ambulatory Visit: Payer: Self-pay

## 2022-09-23 ENCOUNTER — Emergency Department (HOSPITAL_COMMUNITY): Payer: Medicare HMO

## 2022-09-23 ENCOUNTER — Emergency Department (HOSPITAL_BASED_OUTPATIENT_CLINIC_OR_DEPARTMENT_OTHER)
Admit: 2022-09-23 | Discharge: 2022-09-23 | Disposition: A | Discharge status: Home / Self Care | Payer: Medicare HMO | Attending: Emergency Medicine | Admitting: Emergency Medicine

## 2022-09-23 ENCOUNTER — Encounter (HOSPITAL_COMMUNITY): Payer: Self-pay | Admitting: *Deleted

## 2022-09-23 ENCOUNTER — Emergency Department (HOSPITAL_COMMUNITY)
Admission: EM | Admit: 2022-09-23 | Discharge: 2022-09-23 | Disposition: A | Discharge status: Home / Self Care | Payer: Medicare HMO | Attending: Emergency Medicine | Admitting: Emergency Medicine

## 2022-09-23 DIAGNOSIS — E039 Hypothyroidism, unspecified: Secondary | ICD-10-CM | POA: Diagnosis not present

## 2022-09-23 DIAGNOSIS — L03116 Cellulitis of left lower limb: Secondary | ICD-10-CM | POA: Diagnosis not present

## 2022-09-23 DIAGNOSIS — M7989 Other specified soft tissue disorders: Secondary | ICD-10-CM

## 2022-09-23 DIAGNOSIS — Z7901 Long term (current) use of anticoagulants: Secondary | ICD-10-CM | POA: Diagnosis not present

## 2022-09-23 DIAGNOSIS — Z79899 Other long term (current) drug therapy: Secondary | ICD-10-CM | POA: Diagnosis not present

## 2022-09-23 DIAGNOSIS — I10 Essential (primary) hypertension: Secondary | ICD-10-CM | POA: Diagnosis not present

## 2022-09-23 DIAGNOSIS — R600 Localized edema: Secondary | ICD-10-CM | POA: Diagnosis not present

## 2022-09-23 DIAGNOSIS — R2242 Localized swelling, mass and lump, left lower limb: Secondary | ICD-10-CM | POA: Diagnosis not present

## 2022-09-23 DIAGNOSIS — M85872 Other specified disorders of bone density and structure, left ankle and foot: Secondary | ICD-10-CM | POA: Diagnosis not present

## 2022-09-23 DIAGNOSIS — Z794 Long term (current) use of insulin: Secondary | ICD-10-CM | POA: Insufficient documentation

## 2022-09-23 DIAGNOSIS — E119 Type 2 diabetes mellitus without complications: Secondary | ICD-10-CM | POA: Insufficient documentation

## 2022-09-23 DIAGNOSIS — S82142A Displaced bicondylar fracture of left tibia, initial encounter for closed fracture: Secondary | ICD-10-CM | POA: Diagnosis not present

## 2022-09-23 DIAGNOSIS — M25572 Pain in left ankle and joints of left foot: Secondary | ICD-10-CM | POA: Diagnosis not present

## 2022-09-23 LAB — SEDIMENTATION RATE: Sed Rate: 31 mm/hr — ABNORMAL HIGH (ref 0–22)

## 2022-09-23 LAB — CBC WITH DIFFERENTIAL/PLATELET
Abs Immature Granulocytes: 0.02 10*3/uL (ref 0.00–0.07)
Basophils Absolute: 0 10*3/uL (ref 0.0–0.1)
Basophils Relative: 0 %
Eosinophils Absolute: 0.1 10*3/uL (ref 0.0–0.5)
Eosinophils Relative: 2 %
HCT: 36.5 % (ref 36.0–46.0)
Hemoglobin: 11.8 g/dL — ABNORMAL LOW (ref 12.0–15.0)
Immature Granulocytes: 0 %
Lymphocytes Relative: 29 %
Lymphs Abs: 2.3 10*3/uL (ref 0.7–4.0)
MCH: 27.1 pg (ref 26.0–34.0)
MCHC: 32.3 g/dL (ref 30.0–36.0)
MCV: 83.7 fL (ref 80.0–100.0)
Monocytes Absolute: 0.9 10*3/uL (ref 0.1–1.0)
Monocytes Relative: 12 %
Neutro Abs: 4.5 10*3/uL (ref 1.7–7.7)
Neutrophils Relative %: 57 %
Platelets: 237 10*3/uL (ref 150–400)
RBC: 4.36 MIL/uL (ref 3.87–5.11)
RDW: 13.9 % (ref 11.5–15.5)
WBC: 7.9 10*3/uL (ref 4.0–10.5)
nRBC: 0 % (ref 0.0–0.2)

## 2022-09-23 LAB — BASIC METABOLIC PANEL
Anion gap: 12 (ref 5–15)
BUN: 10 mg/dL (ref 8–23)
CO2: 21 mmol/L — ABNORMAL LOW (ref 22–32)
Calcium: 9.2 mg/dL (ref 8.9–10.3)
Chloride: 96 mmol/L — ABNORMAL LOW (ref 98–111)
Creatinine, Ser: 0.68 mg/dL (ref 0.44–1.00)
GFR, Estimated: >60 mL/min (ref >60)
Glucose, Bld: 137 mg/dL — ABNORMAL HIGH (ref 70–99)
Potassium: 3.7 mmol/L (ref 3.5–5.1)
Sodium: 129 mmol/L — ABNORMAL LOW (ref 135–145)

## 2022-09-23 LAB — C-REACTIVE PROTEIN: CRP: 2.5 mg/dL — ABNORMAL HIGH (ref <1.0)

## 2022-09-23 LAB — CBG MONITORING, ED: Glucose-Capillary: 89 mg/dL (ref 70–99)

## 2022-09-23 MED ORDER — SODIUM CHLORIDE 0.9 % IV BOLUS
500.0000 mL | Freq: Once | INTRAVENOUS | Status: AC
  Administered 2022-09-23: 500 mL via INTRAVENOUS

## 2022-09-23 MED ORDER — HYDROCODONE-ACETAMINOPHEN 5-325 MG PO TABS: 1.0000 | ORAL_TABLET | ORAL | 0 refills | Status: DC | PRN

## 2022-09-23 MED ORDER — DOXYCYCLINE HYCLATE 100 MG PO TABS
100.0000 mg | ORAL_TABLET | Freq: Once | ORAL | Status: AC
  Administered 2022-09-23: 100 mg via ORAL
  Filled 2022-09-23: qty 1

## 2022-09-23 MED ORDER — MORPHINE SULFATE (PF) 4 MG/ML IV SOLN
4.0000 mg | Freq: Once | INTRAVENOUS | Status: AC
  Administered 2022-09-23: 4 mg via INTRAVENOUS
  Filled 2022-09-23: qty 1

## 2022-09-23 MED ORDER — FENTANYL CITRATE PF 50 MCG/ML IJ SOSY
50.0000 ug | PREFILLED_SYRINGE | Freq: Once | INTRAMUSCULAR | Status: AC
  Administered 2022-09-23: 50 ug via INTRAVENOUS
  Filled 2022-09-23: qty 1

## 2022-09-23 MED ORDER — DOXYCYCLINE HYCLATE 100 MG PO CAPS: 100.0000 mg | ORAL_CAPSULE | Freq: Two times a day (BID) | ORAL | 0 refills | Status: DC

## 2022-09-23 MED ORDER — HYDROCODONE-ACETAMINOPHEN 5-325 MG PO TABS
1.0000 | ORAL_TABLET | Freq: Four times a day (QID) | ORAL | Status: DC | PRN
  Administered 2022-09-23: 2 via ORAL
  Filled 2022-09-23: qty 2

## 2022-09-23 MED ORDER — FENTANYL CITRATE PF 50 MCG/ML IJ SOSY: 50.0000 ug | PREFILLED_SYRINGE | Freq: Once | INTRAMUSCULAR | Status: DC

## 2022-09-23 MED ORDER — FLUCONAZOLE 150 MG PO TABS: 150.0000 mg | ORAL_TABLET | Freq: Every day | ORAL | 0 refills | Status: DC

## 2022-09-23 NOTE — Discharge Instructions (Addendum)
Keep wrap on until Tuesday (July 9).  You can take it off and use a regular compression sock.

## 2022-09-23 NOTE — ED Provider Notes (Signed)
[place] EMERGENCY DEPARTMENT AT [facility] Provider Note   CSN: [id number] Arrival date & time: 09/23/22  0421     History  Chief Complaint  Patient presents with   Ankle Pain    [name] is a 77 y.o. female.  HPI     This is a 77 year old female who presents with left ankle pain.  She is currently nonweightbearing on the ankle as she recently had tibial plateau fixation and revision.  Her initial surgery was in May.  She had a revision on 6/13.  She is currently on Eliquis for known DVT on the left.  She states over the last day she has had increased pain of the left ankle and noted swelling.  She has had pain with range of motion of the ankle and has had limitations during physical therapy.  She has not noted any fevers or systemic symptoms.  Home Medications Prior to Admission medications   Medication Sig Start Date End Date Taking? Authorizing Provider  acetaminophen (TYLENOL) 500 MG tablet Take 1 tablet (500 mg total) by mouth every 6 (six) hours as needed. 01/20/19   Merrilee Jansky, MD  amLODipine (NORVASC) 5 MG tablet Take 5 mg by mouth daily. 08/30/20   [provider]  apixaban (ELIQUIS) 5 MG TABS tablet Take 1 tablet (5 mg total) by mouth 2 (two) times daily. Start this prescription on 08/09/22 when the starter pack is completed 08/09/22   Rai, Delene Ruffini, MD  Biotin w/ Vitamins C & E (HAIR SKIN & NAILS GUMMIES PO) Take 2 each by mouth daily.    [provider]  Cyanocobalamin (B-12) 1000 MCG SUBL Place 1,000 mcg under the tongue daily.    [provider]  docusate sodium (COLACE) 100 MG capsule Take 1 capsule (100 mg total) by mouth 2 (two) times daily. Patient taking differently: Take 100 mg by mouth daily as needed for moderate constipation. 08/04/22   Rai, Delene Ruffini, MD  DULoxetine (CYMBALTA) 60 MG capsule Take 60 mg by mouth daily.    [provider]  gabapentin (NEURONTIN) 100 MG capsule Take 100 mg by mouth 3  (three) times daily. 08/25/22   [provider]  hydrOXYzine (ATARAX) 25 MG tablet Take 25 mg by mouth every 6 (six) hours as needed for anxiety. 08/25/22   [provider]  insulin lispro (HUMALOG) 100 UNIT/ML injection Inject 0-10 Units into the skin 3 (three) times daily before meals. Sliding Scale    [provider]  irbesartan (AVAPRO) 300 MG tablet Take 300 mg by mouth daily.    [provider]  methocarbamol (ROBAXIN) 500 MG tablet Take 1 tablet (500 mg total) by mouth every 6 (six) hours as needed for muscle spasms. 09/02/22   West Bali, PA-C  metoprolol succinate (TOPROL-XL) 50 MG 24 hr tablet Take 50 mg by mouth daily. 09/30/20   [provider]  Multiple Vitamins-Minerals (MULTIVITAMIN WITH MINERALS) tablet Take 1 tablet by mouth daily.    [provider]  omeprazole (PRILOSEC) 20 MG capsule Take 20 mg by mouth daily. 12/28/19   [provider]  oxyCODONE (OXY IR/ROXICODONE) 5 MG immediate release tablet Take 1 tablet (5 mg total) by mouth every 4 (four) hours as needed for severe pain. 09/02/22   [place] Bali, PA-C  polyethylene glycol (MIRALAX / GLYCOLAX) 17 g packet Take 17 g by mouth daily as needed for mild constipation. 08/04/22   Cathren Harsh, MD  rosuvastatin (  CRESTOR) 20 MG tablet Take 20 mg by mouth at bedtime. 02/08/19   [provider]  tirzepatide Greggory Keen) 5 MG/0.5ML Pen Inject 5 mg into the skin once a week.    [provider]  TOUJEO SOLOSTAR 300 UNIT/ML Solostar Pen Inject 30 Units into the skin daily. 09/23/20   [provider]  traZODone (DESYREL) 50 MG tablet Take 25 mg by mouth at bedtime. 08/19/22   [provider]  triamterene-hydrochlorothiazide (MAXZIDE-25) 37.5-25 MG tablet Take 1 tablet by mouth daily. 08/22/22   [provider]      Allergies    Oxycodone    Review of Systems   Review of Systems  Constitutional:  Negative for fever.  Respiratory:   Negative for shortness of breath.   Cardiovascular:  Negative for chest pain.  Musculoskeletal:        Ankle pain  All other systems reviewed and are negative.   Physical Exam Updated Vital Signs BP (!) 145/86   Pulse 97   Temp 97.9 F (36.6 C)   Resp 16   SpO2 100%  Physical Exam Vitals and nursing note reviewed.  Constitutional:      Appearance: She is well-developed. She is not ill-appearing.  HENT:     Head: Normocephalic and atraumatic.  Eyes:     Pupils: Pupils are equal, round, and reactive to light.  Cardiovascular:     Rate and Rhythm: Normal rate and regular rhythm.  Pulmonary:     Effort: Pulmonary effort is normal. No respiratory distress.  Abdominal:     Palpations: Abdomen is soft.  Musculoskeletal:     Cervical back: Neck supple.     Comments: Focused examination of the left lower extremity with well-healing wound over the medial aspect of the left knee, ankle is diffusely swollen and tender to touch, slight warmth noted but no overlying skin changes, pain with active and passive range of motion, 2+ DP pulse  Skin:    General: Skin is warm and dry.  Neurological:     Mental Status: She is alert and oriented to person, place, and time.  Psychiatric:        Mood and Affect: Mood normal.     ED Results / Procedures / Treatments   Labs (all labs ordered are listed, but only abnormal results are displayed) Labs Reviewed  CBC WITH DIFFERENTIAL/PLATELET  BASIC METABOLIC PANEL    EKG None  Radiology No results found.  Procedures Procedures    Medications Ordered in ED Medications - No data to display  ED Course/ Medical Decision Making/ A&P                             Medical Decision Making Amount and/or Complexity of Data Reviewed Labs: ordered. Radiology: ordered.  Risk Prescription drug management.   This patient presents to the ED for concern of ankle pain, this involves an extensive number of treatment options, and is a complaint  that carries with it a high risk of complications and morbidity.  I considered the following differential and admission for this acute, potentially life threatening condition.  The differential diagnosis includes sprain, recurrent DVT, inflammatory arthritis, cellulitis, septic arthritis  MDM:    This is a 77 year old female with multiple surgeries in the left lower extremity and recent DVT who presents with left isolated ankle swelling and pain.  She is overall nontoxic.  Denies systemic symptoms.  Ankle is warm to touch and  swollen but not significantly erythematous.  She does have passive and active range of motion pain.  Labs obtained.  No leukocytosis.  Sed rate and CRP pending.  X-rays are negative.  Will repeat DVT study and engage orthopedics given multiple recent surgeries.  May need tap of left ankle to rule out septic joint although this is low on my differential at this time.  (Labs, imaging, consults)  Labs: I Ordered, and personally interpreted labs.  The pertinent results include: CBC, BMP, sed rate, CRP  Imaging Studies ordered: I ordered imaging studies including left ankle, DVT pending I independently visualized and interpreted imaging. I agree with the radiologist interpretation  Additional history obtained from chart review.  External records from outside source obtained and reviewed including notes  Cardiac Monitoring: The patient was maintained on a cardiac monitor.  If on the cardiac monitor, I personally viewed and interpreted the cardiac monitored which showed an underlying rhythm of: Sinus rhythm  Reevaluation: After the interventions noted above, I reevaluated the patient and found that they have :stayed the same  Social Determinants of Health:  lives independently  Disposition: Pending, signed out to oncoming provider  Co morbidities that complicate the patient evaluation  Past Medical History:  Diagnosis Date   Anxiety    Arthritis    Depression    DVT  (deep venous thrombosis) (HCC) 08/02/2022   LLE 08/02/22   Essential hypertension    Eustachian tube dysfunction    Fibromyalgia    GERD (gastroesophageal reflux disease)    Hyperlipidemia    Hypothyroid    Mild carotid artery disease (HCC)    OSA (obstructive sleep apnea)    Osteopenia    Pneumonia    Stroke (HCC)    Type 2 diabetes mellitus (HCC)      Medicines Meds ordered this encounter  Medications   fentaNYL (SUBLIMAZE) injection 50 mcg   sodium chloride 0.9 % bolus 500 mL   DISCONTD: fentaNYL (SUBLIMAZE) injection 50 mcg   morphine (PF) 4 MG/ML injection 4 mg   DISCONTD: HYDROcodone-acetaminophen (NORCO/VICODIN) 5-325 MG per tablet 1-2 tablet   doxycycline (VIBRA-TABS) tablet 100 mg   doxycycline (VIBRAMYCIN) 100 MG capsule    Sig: Take 1 capsule (100 mg total) by mouth 2 (two) times daily.    Dispense:  14 capsule    Refill:  0   fluconazole (DIFLUCAN) 150 MG tablet    Sig: Take 1 tablet (150 mg total) by mouth daily.    Dispense:  1 tablet    Refill:  0   HYDROcodone-acetaminophen (NORCO/VICODIN) 5-325 MG tablet    Sig: Take 1 tablet by mouth every 4 (four) hours as needed.    Dispense:  10 tablet    Refill:  0    I have reviewed the patients home medicines and have made adjustments as needed  Problem List / ED Course: Problem List Items Addressed This Visit   None Visit Diagnoses     Cellulitis of left lower extremity    -  Primary   Swelling of lower leg                       Final Clinical Impression(s) / ED Diagnoses Final diagnoses:  None    Rx / DC Orders ED Discharge Orders     None         Shon Baton, MD 09/23/22 2313

## 2022-09-23 NOTE — Progress Notes (Signed)
Left lower extremity venous duplex has been completed. Preliminary results can be found in CV Proc through chart review.  Results were given to Dr. Particia Nearing.  09/23/22 9:38 AM Olen Cordial RVT

## 2022-09-23 NOTE — Progress Notes (Signed)
Orthopedic Tech Progress Note Patient Details:  [name] October 08, 1945 [id number]  Bulky compressive dressing of webril and ace wraps applied to LLE from ankle to above her knee. Encouraged icing and elevation to help with pain/swelling.  Ortho Devices Type of Ortho Device: Ace wrap, Cotton web roll (bulky compressive dressing) Ortho Device/Splint Location: LLE Ortho Device/Splint Interventions: Ordered, Application   Post Interventions Patient Tolerated: Well Instructions Provided: Care of device, Adjustment of device  [name] 09/23/2022, 3:31 PM

## 2022-09-23 NOTE — ED Notes (Signed)
RN assisted pt to restroom using a wheelchair. Pt states her pain is back. RN notified EDP

## 2022-09-23 NOTE — ED Provider Notes (Signed)
Pt signed out by Dr. Wilkie Aye pending [place] and ortho eval.  [place]:  RIGHT:  - No evidence of common femoral vein obstruction.     LEFT:  - There is no evidence of deep vein thrombosis in the lower extremity.     - No cystic structure found in the popliteal fossa.    Pt d/w Montez Morita, PA for Dr. Carola Frost.  He did see pt.  He ordered ct ankle/knee.  CT ankle:  No acute osseous injury of the left ankle.  2. Soft tissue edema along the anterolateral aspect of the left  lower leg which may reflect a soft tissue contusion or cellulitis.   CT knee:  Healing ununited comminuted medial and lateral tibial plateau  fracture transfixed with a medial sideplate and multiple  interlocking screws. 1.5 x 1.5 cm bony defect resulting from  distraction of the bony fragments involving the articular surface of  the medial tibial plateau. Persistent depressed posterolateral  tibial plateau fracture with 6 mm of maximum depression involving an  area of 20 x 14 mm of the posterolateral tibial plateau articular  surface.  2. Healing nondisplaced fracture of the fibular neck.  3. 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   He had ortho place compression dressing.  Pt is to remove the dressing on Tuesday.  She is to then wear a compression sock.  She is d/c with doxy.  She is to return if worse.  F/u with pcp.     Hannah Lefevre, MD 09/23/22 1436

## 2022-09-23 NOTE — ED Notes (Signed)
RN assisted pt to restroom. 

## 2022-09-23 NOTE — Progress Notes (Signed)
Orthopaedic Trauma Service Progress Note  Patient ID: [name] MRN: [id number] DOB/AGE: 76-01-1946 76 y.o.  Subjective:  Pt presents today with complaints of L lower leg and ankle pain  She is almost 3 weeks s/p revision of L tibial plateau fracture  Denies any recent falls or new trauma  Denies any fevers or chills  No recent illnesses  History of L peroneal DVT 08/02/2022---> has been on eliquis since dc on 08/04/2022  States she is getting along ok but has regressed over the last 7-10 days   Has not been wearing any compressive garments for swelling  She has been mobile but not too much  HHPT has been out to the house 2-3 x since last surgery   Reports chronic neuropathy   No CP No SOB No n/v No abd pain   Patient is afebrile, there is no elevated white count.  Her sed rate and CRP are mildly elevated but she is 3 weeks postop along with other concomitant factors including DVT and 2 surgeries within the last 2 months.  ROS As above  Objective:   VITALS:   Vitals:   09/23/22 1130 09/23/22 1145 09/23/22 1228 09/23/22 1230  BP:   131/70 134/71  Pulse: 83 84 89 85  Resp:   17   Temp:   98.1 F (36.7 C)   TempSrc:   Oral   SpO2: 99% 96% 98% 100%    Estimated body mass index is 31.71 kg/m as calculated from the following:   Height as of 09/02/22: 5\' 3"  (1.6 m).   Weight as of 09/02/22: 81.2 kg.   Intake/Output    None     LABS  Results for orders placed or performed during the hospital encounter of 09/23/22 (from the past 24 hour(s))  CBC with Differential     Status: Abnormal   Collection Time: 09/23/22  5:26 AM  Result Value Ref Range   WBC 7.9 4.0 - 10.5 K/uL   RBC 4.36 3.87 - 5.11 MIL/uL   Hemoglobin 11.8 (L) 12.0 - 15.0 g/dL   HCT 78.2 95.6 - 21.3 %   MCV 83.7 80.0 - 100.0 fL   MCH 27.1 26.0 - 34.0 pg   MCHC 32.3 30.0 - 36.0 g/dL   RDW 08.6 57.8 - 46.9 %    Platelets 237 150 - 400 K/uL   nRBC 0.0 0.0 - 0.2 %   Neutrophils Relative % 57 %   Neutro Abs 4.5 1.7 - 7.7 K/uL   Lymphocytes Relative 29 %   Lymphs Abs 2.3 0.7 - 4.0 K/uL   Monocytes Relative 12 %   Monocytes Absolute 0.9 0.1 - 1.0 K/uL   Eosinophils Relative 2 %   Eosinophils Absolute 0.1 0.0 - 0.5 K/uL   Basophils Relative 0 %   Basophils Absolute 0.0 0.0 - 0.1 K/uL   Immature Granulocytes 0 %   Abs Immature Granulocytes 0.02 0.00 - 0.07 K/uL  Basic metabolic panel     Status: Abnormal   Collection Time: 09/23/22  5:26 AM  Result Value Ref Range   Sodium 129 (L) 135 - 145 mmol/L   Potassium 3.7 3.5 - 5.1 mmol/L   Chloride 96 (L) 98 - 111 mmol/L   CO2 21 (L) 22 - 32 mmol/L   Glucose, Bld 137 (H) 70 -  99 mg/dL   BUN 10 8 - 23 mg/dL   Creatinine, Ser 1.61 0.44 - 1.00 mg/dL   Calcium 9.2 8.9 - 09.6 mg/dL   GFR, Estimated >04 >54 mL/min   Anion gap 12 5 - 15  Sedimentation rate     Status: Abnormal   Collection Time: 09/23/22  6:35 AM  Result Value Ref Range   Sed Rate 31 (H) 0 - 22 mm/hr  C-reactive protein     Status: Abnormal   Collection Time: 09/23/22  6:35 AM  Result Value Ref Range   CRP 2.5 (H) <1.0 mg/dL     PHYSICAL EXAM:   Gen: awake, alert, in stretcher, daughter at bedside, very pleasant  Lungs: unlabored Cardiac: reg Ext:       Left Lower Extremity   Incision proximal medial lower leg well healed  No signs of infection, no drainage, no erythema   I do not appreciate much of an effusion   Moderate selling to ankle and foot  No significant erythema noted  Very tender of lateral ankle   No ankle joint line tenderness  DPN, SPN, TN sensation grossly intact  Ankle flexion, extension, inversion and eversion grossly intact  No DCT   Compartments are soft  No pain out of proportion with passive stretch    Assessment/Plan:     Active Problems:   * No active hospital problems. *   Anti-infectives (From admission, onward)    None     .  77  y/o female 3 weeks s/p revision of left tibial plateau fracture, L peroneal nerve DVT (08/02/2022, on eliquis) who presents to the ED with complaints of left ankle and lower leg pain  -Complex left tibial plateau fracture s/p ORIF 07/30/2022 and 09/02/2022  Repeat x-rays today  I did discuss with Dr. Jena Gauss and patient has had limited range of motion due to her pattern since surgery  Continue nonweightbearing  Knee motion as tolerated  -Left lower leg and ankle pain, ?  Post thrombotic syndrome  Ankle x-rays are unremarkable  No significant erythema to her lower leg  Could consider 7-day course of antibiotics for possible subclinical cellulitis  I have reached out to the vascular surgeon who read her DVT study to confirm that there has been complete resolution of her left peroneal DVT on the left side.  I do suspect that this is some type of post thrombotic syndrome picture with swelling and venous incompetence causing her pain.  Will be aggressive with ice elevation and compressive wraps.  I have ordered the Orth[employer] to place a bulky compressive dressing from foot to thigh.   Will follow-up on imaging but do anticipate she will be able to dc home   - Pain management:  Multimodal    Added norco     - Dispo:  Follow up on imaging   Anticipate patient being able to discharge from the ED once all studies have been completed and reviewed    Mearl Latin, PA-C [phone] (C) 09/23/2022, 12:36 PM  [facility] [address] [place] [address] [phone] [phone] (F)    After 5pm and on the weekends please log on to Amion, go to orthopaedics and the look under the [facility] Call for the provider(s) on call. You can also call our office at [phone] and then follow the prompts to be connected to the call team.  Patient ID: [name], female   DOB: Dec 01, 1945, 77 y.o.  MRN: [id number]

## 2022-09-23 NOTE — ED Triage Notes (Signed)
Pt had tibial plateau surgery on L leg on 5/10 with additional surgery on 6/13. Pt reports increased swelling and pain to L ankle. Tender on palpation.

## 2022-09-23 NOTE — ED Notes (Signed)
Vascular at bedside

## 2022-09-28 DIAGNOSIS — W19XXXD Unspecified fall, subsequent encounter: Secondary | ICD-10-CM | POA: Diagnosis not present

## 2022-09-28 DIAGNOSIS — I2510 Atherosclerotic heart disease of native coronary artery without angina pectoris: Secondary | ICD-10-CM | POA: Diagnosis not present

## 2022-09-28 DIAGNOSIS — I82402 Acute embolism and thrombosis of unspecified deep veins of left lower extremity: Secondary | ICD-10-CM | POA: Diagnosis not present

## 2022-09-28 DIAGNOSIS — S82402D Unspecified fracture of shaft of left fibula, subsequent encounter for closed fracture with routine healing: Secondary | ICD-10-CM | POA: Diagnosis not present

## 2022-09-28 DIAGNOSIS — E1165 Type 2 diabetes mellitus with hyperglycemia: Secondary | ICD-10-CM | POA: Diagnosis not present

## 2022-09-28 DIAGNOSIS — E785 Hyperlipidemia, unspecified: Secondary | ICD-10-CM | POA: Diagnosis not present

## 2022-09-28 DIAGNOSIS — S82202D Unspecified fracture of shaft of left tibia, subsequent encounter for closed fracture with routine healing: Secondary | ICD-10-CM | POA: Diagnosis not present

## 2022-09-28 DIAGNOSIS — L259 Unspecified contact dermatitis, unspecified cause: Secondary | ICD-10-CM | POA: Diagnosis not present

## 2022-09-28 DIAGNOSIS — I10 Essential (primary) hypertension: Secondary | ICD-10-CM | POA: Diagnosis not present

## 2022-09-29 ENCOUNTER — Telehealth: Payer: Self-pay

## 2022-09-29 NOTE — Telephone Encounter (Signed)
Transition Care Management Follow-up Telephone Call Date of discharge and from where: 09/23/2022 [facility] How have you been since you were released from the hospital? Patient is feeling much better. Any questions or concerns? No  Items Reviewed: Did the pt receive and understand the discharge instructions provided? Yes  Medications obtained and verified? Yes  Other? No  Any new allergies since your discharge? No  Dietary orders reviewed? Yes Do you have support at home? Yes   Follow up appointments reviewed:  PCP Hospital f/u appt confirmed? Yes  Scheduled to see Geoffry Paradise, MD on 09/24/2022 @ [employer]. Specialist Hospital f/u appt confirmed? No  Scheduled to see  on  @ . Are transportation arrangements needed? No  If their condition worsens, is the pt aware to call PCP or go to the Emergency Dept.? Yes Was the patient provided with contact information for the PCP's office or ED? Yes Was to pt encouraged to call back with questions or concerns? Yes  Leda Bellefeuille [facility]  [facility] Population Health Community Resource Care Guide   [email or web address]  ?? [phone]   Website: [email or web address]  [email or web address]

## 2022-09-30 ENCOUNTER — Telehealth: Payer: Self-pay

## 2022-09-30 NOTE — Telephone Encounter (Signed)
Transition Care Management Unsuccessful Follow-up Telephone Call  Date of discharge and from where:  09/23/2022 [facility]  Attempts:  2nd Attempt  Reason for unsuccessful TCM follow-up call:  Left voice message  Brooklyne Radke [facility]  [facility] Population Health Community Resource Care Guide   [email or web address]  ?? [phone]   Website: [email or web address]  [email or web address]

## 2022-10-05 DIAGNOSIS — L259 Unspecified contact dermatitis, unspecified cause: Secondary | ICD-10-CM | POA: Diagnosis not present

## 2022-10-05 DIAGNOSIS — I82402 Acute embolism and thrombosis of unspecified deep veins of left lower extremity: Secondary | ICD-10-CM | POA: Diagnosis not present

## 2022-10-05 DIAGNOSIS — I2510 Atherosclerotic heart disease of native coronary artery without angina pectoris: Secondary | ICD-10-CM | POA: Diagnosis not present

## 2022-10-05 DIAGNOSIS — W19XXXD Unspecified fall, subsequent encounter: Secondary | ICD-10-CM | POA: Diagnosis not present

## 2022-10-05 DIAGNOSIS — E785 Hyperlipidemia, unspecified: Secondary | ICD-10-CM | POA: Diagnosis not present

## 2022-10-05 DIAGNOSIS — E1165 Type 2 diabetes mellitus with hyperglycemia: Secondary | ICD-10-CM | POA: Diagnosis not present

## 2022-10-05 DIAGNOSIS — I10 Essential (primary) hypertension: Secondary | ICD-10-CM | POA: Diagnosis not present

## 2022-10-05 DIAGNOSIS — S82202D Unspecified fracture of shaft of left tibia, subsequent encounter for closed fracture with routine healing: Secondary | ICD-10-CM | POA: Diagnosis not present

## 2022-10-05 DIAGNOSIS — S82402D Unspecified fracture of shaft of left fibula, subsequent encounter for closed fracture with routine healing: Secondary | ICD-10-CM | POA: Diagnosis not present

## 2022-10-12 DIAGNOSIS — E785 Hyperlipidemia, unspecified: Secondary | ICD-10-CM | POA: Diagnosis not present

## 2022-10-12 DIAGNOSIS — I82402 Acute embolism and thrombosis of unspecified deep veins of left lower extremity: Secondary | ICD-10-CM | POA: Diagnosis not present

## 2022-10-12 DIAGNOSIS — E1165 Type 2 diabetes mellitus with hyperglycemia: Secondary | ICD-10-CM | POA: Diagnosis not present

## 2022-10-12 DIAGNOSIS — S82202D Unspecified fracture of shaft of left tibia, subsequent encounter for closed fracture with routine healing: Secondary | ICD-10-CM | POA: Diagnosis not present

## 2022-10-12 DIAGNOSIS — W19XXXD Unspecified fall, subsequent encounter: Secondary | ICD-10-CM | POA: Diagnosis not present

## 2022-10-12 DIAGNOSIS — S82142D Displaced bicondylar fracture of left tibia, subsequent encounter for closed fracture with routine healing: Secondary | ICD-10-CM | POA: Diagnosis not present

## 2022-10-12 DIAGNOSIS — L259 Unspecified contact dermatitis, unspecified cause: Secondary | ICD-10-CM | POA: Diagnosis not present

## 2022-10-12 DIAGNOSIS — I10 Essential (primary) hypertension: Secondary | ICD-10-CM | POA: Diagnosis not present

## 2022-10-12 DIAGNOSIS — I2510 Atherosclerotic heart disease of native coronary artery without angina pectoris: Secondary | ICD-10-CM | POA: Diagnosis not present

## 2022-10-12 DIAGNOSIS — S82402D Unspecified fracture of shaft of left fibula, subsequent encounter for closed fracture with routine healing: Secondary | ICD-10-CM | POA: Diagnosis not present

## 2022-10-13 DIAGNOSIS — E785 Hyperlipidemia, unspecified: Secondary | ICD-10-CM | POA: Diagnosis not present

## 2022-10-13 DIAGNOSIS — I10 Essential (primary) hypertension: Secondary | ICD-10-CM | POA: Diagnosis not present

## 2022-10-13 DIAGNOSIS — Z794 Long term (current) use of insulin: Secondary | ICD-10-CM | POA: Diagnosis not present

## 2022-10-13 DIAGNOSIS — K760 Fatty (change of) liver, not elsewhere classified: Secondary | ICD-10-CM | POA: Diagnosis not present

## 2022-10-13 DIAGNOSIS — E1169 Type 2 diabetes mellitus with other specified complication: Secondary | ICD-10-CM | POA: Diagnosis not present

## 2022-10-18 DIAGNOSIS — I2510 Atherosclerotic heart disease of native coronary artery without angina pectoris: Secondary | ICD-10-CM | POA: Diagnosis not present

## 2022-10-18 DIAGNOSIS — E1165 Type 2 diabetes mellitus with hyperglycemia: Secondary | ICD-10-CM | POA: Diagnosis not present

## 2022-10-18 DIAGNOSIS — W19XXXD Unspecified fall, subsequent encounter: Secondary | ICD-10-CM | POA: Diagnosis not present

## 2022-10-18 DIAGNOSIS — E785 Hyperlipidemia, unspecified: Secondary | ICD-10-CM | POA: Diagnosis not present

## 2022-10-18 DIAGNOSIS — I10 Essential (primary) hypertension: Secondary | ICD-10-CM | POA: Diagnosis not present

## 2022-10-18 DIAGNOSIS — S82202D Unspecified fracture of shaft of left tibia, subsequent encounter for closed fracture with routine healing: Secondary | ICD-10-CM | POA: Diagnosis not present

## 2022-10-18 DIAGNOSIS — I82402 Acute embolism and thrombosis of unspecified deep veins of left lower extremity: Secondary | ICD-10-CM | POA: Diagnosis not present

## 2022-10-18 DIAGNOSIS — S82402D Unspecified fracture of shaft of left fibula, subsequent encounter for closed fracture with routine healing: Secondary | ICD-10-CM | POA: Diagnosis not present

## 2022-10-18 DIAGNOSIS — L259 Unspecified contact dermatitis, unspecified cause: Secondary | ICD-10-CM | POA: Diagnosis not present

## 2022-10-21 ENCOUNTER — Telehealth: Payer: Self-pay | Admitting: Pharmacy Technician

## 2022-10-21 DIAGNOSIS — Z5986 Financial insecurity: Secondary | ICD-10-CM

## 2022-10-21 NOTE — Progress Notes (Signed)
[facility])                                            [facility] Quality Pharmacy Team    10/21/2022  [name] 1945/05/07 [id number]  Received patient and provider  portion(s) of patient assistance application(s) for [place], Novolog and Ozempic. Faxed completed application and required documents into [employer].  NOTE:Based on income documents submitted, patient does not qualify for [employer] patient assistance foundation for Eliquis as patient is over income.  Pattricia Boss, CPhT [place]  [facility] Office: [phone] Fax: [phone] Email: [email or web address]

## 2022-10-26 ENCOUNTER — Telehealth: Payer: Self-pay | Admitting: Pharmacy Technician

## 2022-10-26 DIAGNOSIS — S82402D Unspecified fracture of shaft of left fibula, subsequent encounter for closed fracture with routine healing: Secondary | ICD-10-CM | POA: Diagnosis not present

## 2022-10-26 DIAGNOSIS — I10 Essential (primary) hypertension: Secondary | ICD-10-CM | POA: Diagnosis not present

## 2022-10-26 DIAGNOSIS — I2510 Atherosclerotic heart disease of native coronary artery without angina pectoris: Secondary | ICD-10-CM | POA: Diagnosis not present

## 2022-10-26 DIAGNOSIS — I82402 Acute embolism and thrombosis of unspecified deep veins of left lower extremity: Secondary | ICD-10-CM | POA: Diagnosis not present

## 2022-10-26 DIAGNOSIS — E785 Hyperlipidemia, unspecified: Secondary | ICD-10-CM | POA: Diagnosis not present

## 2022-10-26 DIAGNOSIS — E1165 Type 2 diabetes mellitus with hyperglycemia: Secondary | ICD-10-CM | POA: Diagnosis not present

## 2022-10-26 DIAGNOSIS — L259 Unspecified contact dermatitis, unspecified cause: Secondary | ICD-10-CM | POA: Diagnosis not present

## 2022-10-26 DIAGNOSIS — Z5986 Financial insecurity: Secondary | ICD-10-CM

## 2022-10-26 DIAGNOSIS — S82202D Unspecified fracture of shaft of left tibia, subsequent encounter for closed fracture with routine healing: Secondary | ICD-10-CM | POA: Diagnosis not present

## 2022-10-26 DIAGNOSIS — W19XXXD Unspecified fall, subsequent encounter: Secondary | ICD-10-CM | POA: Diagnosis not present

## 2022-10-26 NOTE — Progress Notes (Addendum)
[facility])                                            [facility] Quality Pharmacy Team    10/26/2022  Jezebelle Aldama Poucher 11/04/1945 [id number]  Care coordination call placed to [employer] in regard to [place], Novolog and Ozempic application.   Spoke to [place] who informs patient is APPROVED 10/25/22-03/22/23 as patient has Medicare Part D. Initial shipment as well as refills will automatically fill and ship to prescriber's address on file.. Patient may call Novo Nordisk at [phone] if shipment has not arrived and patient does not have sufficient supply.  Pattricia Boss, CPhT [place]  [facility] Office: [phone] Fax: [phone] Email: [email or web address]

## 2022-10-26 NOTE — Progress Notes (Unsigned)
Cardiology Office Note  Date: 10/27/2022   ID: [name], DOB 1945/11/28, MRN [id number]  History of Present Illness: [name] is a 77 y.o. female last seen in July 2023 by Ms. Lilla Shook, I reviewed the note.  She is here today with her daughter for follow-up visit.  I reviewed her records.  She had a fall back in May resulting in left tibial plateau fracture requiring ORIF.  She did require revision in June and is still recovering with PT.  Using a walker at this time.  She was also diagnosed with a left leg DVT in May with plan for 49-month course of Eliquis.  I reviewed her medications.  She continues on antihypertensive therapy including Norvasc, Avapro, Toprol-XL, and Maxide.  She has lost some weight on Mounjaro as well, blood pressure is well-controlled today.  I reviewed her interval lab work as well as ECG.  Physical Exam: VS:  BP 126/78   Pulse 82   Ht 5' 3.5" (1.613 m)   Wt 171 lb 6.4 oz (77.7 kg)   SpO2 98%   BMI 29.89 kg/m , BMI Body mass index is 29.89 kg/m.  Wt Readings from Last 3 Encounters:  10/27/22 171 lb 6.4 oz (77.7 kg)  09/02/22 179 lb (81.2 kg)  07/30/22 173 lb 15.1 oz (78.9 kg)    General: Patient appears comfortable at rest. HEENT: Conjunctiva and lids normal. Neck: Supple, no elevated JVP or carotid bruits. Lungs: Clear to auscultation, nonlabored breathing at rest. Cardiac: Regular rate and rhythm, no S3 or significant systolic murmur. Extremities: No pitting edema.  ECG:  An ECG dated 07/29/2022 was personally reviewed today and demonstrated:  Sinus rhythm with prolonged PR interval, left anterior fascicular block, and poor R wave progression.  Labwork: 08/04/2022: Magnesium 1.8 09/23/2022: BUN 10; Creatinine, Ser 0.68; Hemoglobin 11.8; Platelets 237; Potassium 3.7; Sodium 129   Other Studies Reviewed Today:  Echocardiogram 10/28/2021:  1. Left ventricular ejection fraction, by estimation, is 60 to 65%. The  left ventricle has  normal function. The left ventricle has no regional  wall motion abnormalities. Left ventricular diastolic parameters are  consistent with Grade I diastolic  dysfunction (impaired relaxation).   2. Right ventricular systolic function is normal. The right ventricular  size is normal. There is normal pulmonary artery systolic pressure. The  estimated right ventricular systolic pressure is 23.2 mmHg.   3. The mitral valve is grossly normal. Trivial mitral valve  regurgitation. No evidence of mitral stenosis.   4. The aortic valve is tricuspid. There is mild thickening of the aortic  valve. Aortic valve regurgitation is trivial. No aortic stenosis is  present.   5. The inferior vena cava is normal in size with greater than 50%  respiratory variability, suggesting right atrial pressure of 3 mmHg.   Assessment and Plan:  1.  Dyspnea on exertion and history of atypical chest pain.  Echocardiogram from August 2023 revealed LVEF 60 to 65% with mild diastolic dysfunction and normal estimated RVSP.  Lexiscan Myoview from 2022 was low risk without active ischemia.  She is symptomatically stable at this time.  Plan is to continue observation with continued efforts at risk factor reduction.  2.  Essential hypertension.  Blood pressure well-controlled today on multimodal therapy including Norvasc, Avapro, Toprol-XL, and Maxide.  Renal function normal by lab work in July.  No changes were made today.  3.  Mixed hyperlipidemia.  She remains on Crestor with follow-up by PCP.  4.  OSA on CPAP.  Reports compliance with treatment.  5.  DVT involving left peroneal vein diagnosed in May.  Anticipate 82-month course of Eliquis, she has follow-up with her orthopedic surgeon later in the month.  Disposition:  Follow up  6 months.  Signed, Jonelle Sidle, M.D., F.A.C.C. [facility] at [facility]

## 2022-10-27 ENCOUNTER — Ambulatory Visit: Payer: Medicare HMO | Attending: Cardiology | Admitting: Cardiology

## 2022-10-27 ENCOUNTER — Encounter: Payer: Self-pay | Admitting: Cardiology

## 2022-10-27 VITALS — BP 126/78 | HR 82 | Ht 63.5 in | Wt 171.4 lb

## 2022-10-27 DIAGNOSIS — E782 Mixed hyperlipidemia: Secondary | ICD-10-CM | POA: Diagnosis not present

## 2022-10-27 DIAGNOSIS — I10 Essential (primary) hypertension: Secondary | ICD-10-CM

## 2022-10-27 DIAGNOSIS — R0609 Other forms of dyspnea: Secondary | ICD-10-CM

## 2022-10-27 NOTE — Patient Instructions (Signed)
Medication Instructions:  Your physician recommends that you continue on your current medications as directed. Please refer to the Current Medication list given to you today.  *If you need a refill on your cardiac medications before your next appointment, please call your pharmacy*   Lab Work: NONE   If you have labs (blood work) drawn today and your tests are completely normal, you will receive your results only by: MyChart Message (if you have MyChart) OR A paper copy in the mail If you have any lab test that is abnormal or we need to change your treatment, we will call you to review the results.   Testing/Procedures: NONE    Follow-Up: At [facility], you and your health needs are our priority.  As part of our continuing mission to provide you with exceptional heart care, we have created designated Provider Care Teams.  These Care Teams include your primary Cardiologist (physician) and Advanced Practice Providers (APPs -  Physician Assistants and Nurse Practitioners) who all work together to provide you with the care you need, when you need it.  We recommend signing up for the patient portal called "MyChart".  Sign up information is provided on this After Visit Summary.  MyChart is used to connect with patients for Virtual Visits (Telemedicine).  Patients are able to view lab/test results, encounter notes, upcoming appointments, etc.  Non-urgent messages can be sent to your provider as well.   To learn more about what you can do with MyChart, go to [email or web address].    Your next appointment:   6 month(s)  Provider:   You may see Samuel McDowell, MD or one of the following Advanced Practice Providers on your designated Care Team:   Brittany Strader, PA-C  Michele Lenze, PA-C     Other Instructions Thank you for choosing  HeartCare!    

## 2022-10-30 DIAGNOSIS — E119 Type 2 diabetes mellitus without complications: Secondary | ICD-10-CM | POA: Diagnosis not present

## 2022-11-02 DIAGNOSIS — I82402 Acute embolism and thrombosis of unspecified deep veins of left lower extremity: Secondary | ICD-10-CM | POA: Diagnosis not present

## 2022-11-02 DIAGNOSIS — E1165 Type 2 diabetes mellitus with hyperglycemia: Secondary | ICD-10-CM | POA: Diagnosis not present

## 2022-11-02 DIAGNOSIS — B370 Candidal stomatitis: Secondary | ICD-10-CM | POA: Diagnosis not present

## 2022-11-02 DIAGNOSIS — L259 Unspecified contact dermatitis, unspecified cause: Secondary | ICD-10-CM | POA: Diagnosis not present

## 2022-11-02 DIAGNOSIS — E1169 Type 2 diabetes mellitus with other specified complication: Secondary | ICD-10-CM | POA: Diagnosis not present

## 2022-11-02 DIAGNOSIS — W19XXXD Unspecified fall, subsequent encounter: Secondary | ICD-10-CM | POA: Diagnosis not present

## 2022-11-02 DIAGNOSIS — E785 Hyperlipidemia, unspecified: Secondary | ICD-10-CM | POA: Diagnosis not present

## 2022-11-02 DIAGNOSIS — S82402D Unspecified fracture of shaft of left fibula, subsequent encounter for closed fracture with routine healing: Secondary | ICD-10-CM | POA: Diagnosis not present

## 2022-11-02 DIAGNOSIS — I2510 Atherosclerotic heart disease of native coronary artery without angina pectoris: Secondary | ICD-10-CM | POA: Diagnosis not present

## 2022-11-02 DIAGNOSIS — I10 Essential (primary) hypertension: Secondary | ICD-10-CM | POA: Diagnosis not present

## 2022-11-02 DIAGNOSIS — S82202D Unspecified fracture of shaft of left tibia, subsequent encounter for closed fracture with routine healing: Secondary | ICD-10-CM | POA: Diagnosis not present

## 2022-11-03 DIAGNOSIS — I2510 Atherosclerotic heart disease of native coronary artery without angina pectoris: Secondary | ICD-10-CM | POA: Diagnosis not present

## 2022-11-03 DIAGNOSIS — L259 Unspecified contact dermatitis, unspecified cause: Secondary | ICD-10-CM | POA: Diagnosis not present

## 2022-11-03 DIAGNOSIS — E1165 Type 2 diabetes mellitus with hyperglycemia: Secondary | ICD-10-CM | POA: Diagnosis not present

## 2022-11-03 DIAGNOSIS — I10 Essential (primary) hypertension: Secondary | ICD-10-CM | POA: Diagnosis not present

## 2022-11-03 DIAGNOSIS — S82202D Unspecified fracture of shaft of left tibia, subsequent encounter for closed fracture with routine healing: Secondary | ICD-10-CM | POA: Diagnosis not present

## 2022-11-03 DIAGNOSIS — I82402 Acute embolism and thrombosis of unspecified deep veins of left lower extremity: Secondary | ICD-10-CM | POA: Diagnosis not present

## 2022-11-03 DIAGNOSIS — S82402D Unspecified fracture of shaft of left fibula, subsequent encounter for closed fracture with routine healing: Secondary | ICD-10-CM | POA: Diagnosis not present

## 2022-11-03 DIAGNOSIS — W19XXXD Unspecified fall, subsequent encounter: Secondary | ICD-10-CM | POA: Diagnosis not present

## 2022-11-08 DIAGNOSIS — E1169 Type 2 diabetes mellitus with other specified complication: Secondary | ICD-10-CM | POA: Diagnosis not present

## 2022-11-08 DIAGNOSIS — K117 Disturbances of salivary secretion: Secondary | ICD-10-CM | POA: Diagnosis not present

## 2022-11-08 DIAGNOSIS — B370 Candidal stomatitis: Secondary | ICD-10-CM | POA: Diagnosis not present

## 2022-11-08 DIAGNOSIS — J019 Acute sinusitis, unspecified: Secondary | ICD-10-CM | POA: Diagnosis not present

## 2022-11-09 DIAGNOSIS — E785 Hyperlipidemia, unspecified: Secondary | ICD-10-CM | POA: Diagnosis not present

## 2022-11-09 DIAGNOSIS — E1165 Type 2 diabetes mellitus with hyperglycemia: Secondary | ICD-10-CM | POA: Diagnosis not present

## 2022-11-09 DIAGNOSIS — I2510 Atherosclerotic heart disease of native coronary artery without angina pectoris: Secondary | ICD-10-CM | POA: Diagnosis not present

## 2022-11-09 DIAGNOSIS — I82402 Acute embolism and thrombosis of unspecified deep veins of left lower extremity: Secondary | ICD-10-CM | POA: Diagnosis not present

## 2022-11-09 DIAGNOSIS — W19XXXD Unspecified fall, subsequent encounter: Secondary | ICD-10-CM | POA: Diagnosis not present

## 2022-11-09 DIAGNOSIS — S82402D Unspecified fracture of shaft of left fibula, subsequent encounter for closed fracture with routine healing: Secondary | ICD-10-CM | POA: Diagnosis not present

## 2022-11-09 DIAGNOSIS — S82202D Unspecified fracture of shaft of left tibia, subsequent encounter for closed fracture with routine healing: Secondary | ICD-10-CM | POA: Diagnosis not present

## 2022-11-09 DIAGNOSIS — L259 Unspecified contact dermatitis, unspecified cause: Secondary | ICD-10-CM | POA: Diagnosis not present

## 2022-11-09 DIAGNOSIS — I10 Essential (primary) hypertension: Secondary | ICD-10-CM | POA: Diagnosis not present

## 2022-11-15 DIAGNOSIS — S82402D Unspecified fracture of shaft of left fibula, subsequent encounter for closed fracture with routine healing: Secondary | ICD-10-CM | POA: Diagnosis not present

## 2022-11-15 DIAGNOSIS — I82402 Acute embolism and thrombosis of unspecified deep veins of left lower extremity: Secondary | ICD-10-CM | POA: Diagnosis not present

## 2022-11-15 DIAGNOSIS — I10 Essential (primary) hypertension: Secondary | ICD-10-CM | POA: Diagnosis not present

## 2022-11-15 DIAGNOSIS — E1165 Type 2 diabetes mellitus with hyperglycemia: Secondary | ICD-10-CM | POA: Diagnosis not present

## 2022-11-15 DIAGNOSIS — I2510 Atherosclerotic heart disease of native coronary artery without angina pectoris: Secondary | ICD-10-CM | POA: Diagnosis not present

## 2022-11-15 DIAGNOSIS — L259 Unspecified contact dermatitis, unspecified cause: Secondary | ICD-10-CM | POA: Diagnosis not present

## 2022-11-15 DIAGNOSIS — E785 Hyperlipidemia, unspecified: Secondary | ICD-10-CM | POA: Diagnosis not present

## 2022-11-15 DIAGNOSIS — W19XXXD Unspecified fall, subsequent encounter: Secondary | ICD-10-CM | POA: Diagnosis not present

## 2022-11-15 DIAGNOSIS — S82202D Unspecified fracture of shaft of left tibia, subsequent encounter for closed fracture with routine healing: Secondary | ICD-10-CM | POA: Diagnosis not present

## 2022-11-16 DIAGNOSIS — S82142D Displaced bicondylar fracture of left tibia, subsequent encounter for closed fracture with routine healing: Secondary | ICD-10-CM | POA: Diagnosis not present

## 2022-12-01 ENCOUNTER — Other Ambulatory Visit: Payer: Self-pay

## 2022-12-01 ENCOUNTER — Ambulatory Visit (HOSPITAL_COMMUNITY): Payer: Medicare HMO | Attending: Student | Admitting: Physical Therapy

## 2022-12-01 DIAGNOSIS — M79605 Pain in left leg: Secondary | ICD-10-CM | POA: Insufficient documentation

## 2022-12-01 DIAGNOSIS — M25662 Stiffness of left knee, not elsewhere classified: Secondary | ICD-10-CM | POA: Diagnosis not present

## 2022-12-01 DIAGNOSIS — M6281 Muscle weakness (generalized): Secondary | ICD-10-CM | POA: Diagnosis not present

## 2022-12-01 DIAGNOSIS — R262 Difficulty in walking, not elsewhere classified: Secondary | ICD-10-CM | POA: Diagnosis not present

## 2022-12-01 NOTE — Therapy (Signed)
OUTPATIENT PHYSICAL THERAPY LOWER EXTREMITY EVALUATION   Patient Name: [name] MRN: [id number] DOB:[birth year], 77 y.o., female Today's Date: 12/01/2022  END OF SESSION:  PT End of Session - 12/01/22 1029     Visit Number 1    Number of Visits 16    Date for PT Re-Evaluation 02/02/23    Authorization Type requested 12 visits from [place]    PT Start Time 0945    PT Stop Time 1030    PT Time Calculation (min) 45 min    Activity Tolerance Patient tolerated treatment well    Behavior During Therapy [facility] for tasks assessed/performed             Past Medical History:  Diagnosis Date   Anxiety    Arthritis    Depression    DVT (deep venous thrombosis) (HCC) 08/02/2022   LLE 08/02/22   Essential hypertension    Eustachian tube dysfunction    Fibromyalgia    GERD (gastroesophageal reflux disease)    Hyperlipidemia    Hypothyroid    Mild carotid artery disease (HCC)    OSA (obstructive sleep apnea)    Osteopenia    Pneumonia    Stroke (HCC)    Type 2 diabetes mellitus (HCC)    Past Surgical History:  Procedure Laterality Date   ANKLE SURGERY     Anteriorvesicourethropexy     BUNIONECTOMY     COLONOSCOPY     NASAL SINUS SURGERY     ORIF TIBIA PLATEAU Left 07/30/2022   Procedure: OPEN REDUCTION INTERNAL FIXATION (ORIF) TIBIAL PLATEAU;  Surgeon: Roby Lofts, MD;  Location: [facility] OR;  Service: Orthopedics;  Laterality: Left;   ORIF TIBIA PLATEAU Left 09/02/2022   Procedure: REVISION FIXATION OF LEFT TIBIAL PLATEAU FRACTURE;  Surgeon: Roby Lofts, MD;  Location: [facility] OR;  Service: Orthopedics;  Laterality: Left;   PATELLA FRACTURE SURGERY  2009   Right   TONSILLECTOMY     TOTAL ABDOMINAL HYSTERECTOMY     WRIST FRACTURE SURGERY  2009   Patient Active Problem List   Diagnosis Date Noted   Closed fracture of left tibial plateau 09/02/2022   Left tibial fracture 07/29/2022   Dyspnea on exertion 09/15/2020   CVA (cerebral vascular accident) (HCC) 04/20/2019    Pain in left hip 10/05/2016   Pain in right hip 10/05/2016   Trochanteric bursitis, right hip 10/05/2016   Trochanteric bursitis, left hip 10/05/2016   Chronic insomnia 01/28/2011   Seasonal and perennial allergic rhinitis 05/28/2010   Lung nodule 05/28/2010   HYPERLIPIDEMIA 11/23/2009   G E R D 11/23/2009   EUSTACHIAN TUBE DYSFUNCTION 11/18/2009   HYPERTENSION 11/18/2009   Unspecified hypothyroidism 03/14/2007   Obstructive sleep apnea 03/14/2007   FIBROMYALGIA 03/14/2007    PCP: Geoffry Paradise  REFERRING PROVIDER: Roby Lofts, MD  REFERRING DIAG: (L) FIBIAL PLATEAU FRACTURE ORIF (L) FIBIAL PLATEAU FX DOS( 09/02/22)-revision   THERAPY DIAG:  Left leg pain Difficulty in walking Muscle weakness   Rationale for Evaluation and Treatment: Rehabilitation  ONSET DATE: 07/30/22  SUBJECTIVE:   SUBJECTIVE STATEMENT: PT states that she is having difficulty cleaning her house and cooking.  She has been trying not to use the walker as much at home but it makes her knee swell up.  States in the evening the pain is so bad that she is not getting any sleep.  Pt was not using an assistive device prior to the fall.   PERTINENT HISTORY:  [employer] is  an 77 y.o. female with past medical history of CVA, hypertension, hyperlipidemia, GERD, OSA  Patient sustained a fall on Jul 29, 2022, resulting in a left tibial plateau fracture. Patient underwent ORIF of left tibial plateau by Dr. Jena Gauss on 07/30/2022. She was placed in a hinged knee brace postoperatively and instructed to be nonweightbearing on the left lower extremity. Patient has been compliant with these weightbearing precautions. When patient was evaluated at her postop appointment on 07/31/2022, she was found to have some loss of fixation of the fracture thus she had a second surgery on 09/02/22.  Pt states that she had HH therapy that stopped about a week ago.   PAIN:  Are you having pain? Yes: NPRS scale: 0/10,  worst pain is an  8/10 mainly at night  Pain location: Lt knee down her shin  Pain description: throb  Aggravating factors: WB Relieving factors: rest   PRECAUTIONS: fall      WEIGHT BEARING RESTRICTIONS: WBAT  FALLS:  Has patient fallen in last 6 months? Yes. Number of falls 2  LIVING ENVIRONMENT: Lives with: lives with their family Lives in: House/apartment Stairs: No Has following equipment at home: Environmental consultant - 2 wheeled and stick   OCCUPATION: retired  PLOF: Independent  PATIENT GOALS: Get back to church, get back to doing her grocery shopping and her housework   NEXT MD VISIT: 12/23/22  OBJECTIVE:    PATIENT SURVEYS:  FOTO 28       SENSATION:hyper sensitive    EDEMA: normal for injury    PALPATION: Noted edema and tender to the touch   LOWER EXTREMITY ROM:  Active ROM Right eval Left eval  Hip flexion    Hip extension    Hip abduction    Hip adduction    Hip internal rotation    Hip external rotation    Knee flexion  110  Knee extension  Lacking 12  Ankle dorsiflexion    Ankle plantarflexion    Ankle inversion    Ankle eversion     (Blank rows = not tested)  LOWER EXTREMITY MMT:  MMT Right eval Left eval  Hip flexion 5 4+  Hip extension 3+ 3+  Hip abduction 5 4-  Hip adduction    Hip internal rotation    Hip external rotation    Knee flexion 4+ 4+  Knee extension 5 3  Ankle dorsiflexion 5 5  Ankle plantarflexion    Ankle inversion    Ankle eversion     (Blank rows = not tested)    FUNCTIONAL TESTS:  30 seconds chair stand test:  9 x using arms and walker.  Average for age and sex 59 5 sit to stand 16.7 seconds  2 minute walk test: with RW 324 feet.     TODAY'S TREATMENT:  DATE: 12/01/22: Evaluation:  HEP how to start a desensitization program    PATIENT EDUCATION:  Education details: HEP, desensitization program   Person educated: Patient Education method: Explanation, Verbal cues, and Handouts Education comprehension: returned demonstration  HOME EXERCISE PROGRAM: Access Code: [email or web address] URL: [email or web address] Date: 12/01/2022 Prepared by: Virgina Organ  Exercises - Seated Long Arc Quad  - 1 x daily - 7 x weekly - 3 sets - 10 reps - Supine Quadricep Sets  - 1 x daily - 7 x weekly - 3 sets - 10 reps - Active Straight Leg Raise with Quad Set  - 1 x daily - 7 x weekly - 3 sets - 10 reps - Supine Bridge  - 1 x daily - 7 x weekly - 3 sets - 10 reps - Supine Heel Slide  - 1 x daily - 7 x weekly - 3 sets - 10 reps  ASSESSMENT:  CLINICAL IMPRESSION: Patient is a 77 y.o. female  who was seen today for physical therapy evaluation and treatment for s/p Lt tibial plateau fx.  Evaluation demonstrates decreased activity tolerance, decreased ROM, decreased strength, decreased balance, increased edema and increased pain.  Ms. Vanauken will benefit from skilled PT to address these issues and maximize her functional ability.    OBJECTIVE IMPAIRMENTS: Abnormal gait, decreased activity tolerance, decreased balance, difficulty walking, decreased ROM, decreased strength, increased edema, increased fascial restrictions, impaired flexibility, and pain.   ACTIVITY LIMITATIONS: carrying, lifting, bending, sitting, squatting, sleeping, stairs, toileting, and locomotion level  PARTICIPATION LIMITATIONS: cleaning, laundry, driving, shopping, community activity, and church  PERSONAL FACTORS: Age, Fitness, and 1 comorbidity: CVA  are also affecting patient's functional outcome.   REHAB POTENTIAL: Good  CLINICAL DECISION MAKING: Stable/uncomplicated  EVALUATION COMPLEXITY: Moderate   GOALS: Goals reviewed with patient? No  SHORT TERM GOALS: Target date: 12/29/22 PT to be I in HEP in order to decrease her pain to no greater than a 5/10 to allow pt to get 3 hours of sleep  Baseline: Goal status:  INITIAL  2.  Pt ROM to improve to 0-120 to allow pt to be able to walk with a normal gait.  Baseline:  Goal status: INITIAL  3.  Pt core and LE strength to increase 1/2 grade to allow to be able to come sit to stand from a chair without using her UE.  Baseline:  Goal status: INITIAL  4.  PT hyper sensitivity of LE to be decreased to allow a sheet to be over her leg without irritation.  Baseline:  Goal status: INITIAL    LONG TERM GOALS: Target date: 02/02/23  PT to be I in an advanced  HEP in order to decrease her pain to no greater than a 2/10 to allow pt to get 6 hours of sleep Baseline:  Goal status: INITIAL  2.  Pt core and LE strength to increase 1 grade to allow to be able to go up and down 12 steps with the use of one handrail  Baseline:  Goal status: INITIAL  3.  Pt to be able to clean her house without difficulty  Baseline:  Goal status: INITIAL  4.  Pt to be able to cook a full meal without having to rest  Baseline:  Goal status: INITIAL  5.  PT to be going back to church services  Baseline:  Goal status: INITIAL   PLAN:  PT FREQUENCY: 2x/week  PT DURATION: 8 weeks  PLANNED INTERVENTIONS: Therapeutic exercises, Therapeutic activity, Neuromuscular re-education, Balance training, Gait  training, Patient/Family education, Self Care, Joint mobilization, and Manual therapy  PLAN FOR NEXT SESSION: Begin manual to decrease sensitivity as well as edema, ROM, strengthening and balance exercises.  Attempt gt with cane.   Virgina Organ, PT CLT [phone]  12/01/2022, 10:34 AM

## 2022-12-01 NOTE — Addendum Note (Signed)
Addended by: Bella Kennedy on: 12/01/2022 10:47 AM   Modules accepted: Orders

## 2022-12-02 ENCOUNTER — Ambulatory Visit (HOSPITAL_COMMUNITY): Payer: Medicare HMO | Admitting: Physical Therapy

## 2022-12-02 DIAGNOSIS — M25662 Stiffness of left knee, not elsewhere classified: Secondary | ICD-10-CM

## 2022-12-02 DIAGNOSIS — Z23 Encounter for immunization: Secondary | ICD-10-CM | POA: Diagnosis not present

## 2022-12-02 DIAGNOSIS — M79605 Pain in left leg: Secondary | ICD-10-CM | POA: Diagnosis not present

## 2022-12-02 DIAGNOSIS — M6281 Muscle weakness (generalized): Secondary | ICD-10-CM

## 2022-12-02 DIAGNOSIS — R262 Difficulty in walking, not elsewhere classified: Secondary | ICD-10-CM | POA: Diagnosis not present

## 2022-12-02 NOTE — Therapy (Signed)
OUTPATIENT PHYSICAL THERAPY LOWER EXTREMITY Treatment    Patient Name: [name] MRN: [id number] DOB:01/10/1946, 77 y.o., female Today's Date: 12/02/2022  END OF SESSION:  PT End of Session - 12/02/22 0930     Visit Number 2    Number of Visits 16    Date for PT Re-Evaluation 02/02/23    Authorization Type requested 12 visits from [place]    PT Start Time 0900    PT Stop Time 0930    PT Time Calculation (min) 30 min    Equipment Utilized During Treatment Gait belt    Activity Tolerance Patient tolerated treatment well    Behavior During Therapy WFL for tasks assessed/performed             Past Medical History:  Diagnosis Date   Anxiety    Arthritis    Depression    DVT (deep venous thrombosis) (HCC) 08/02/2022   LLE 08/02/22   Essential hypertension    Eustachian tube dysfunction    Fibromyalgia    GERD (gastroesophageal reflux disease)    Hyperlipidemia    Hypothyroid    Mild carotid artery disease (HCC)    OSA (obstructive sleep apnea)    Osteopenia    Pneumonia    Stroke (HCC)    Type 2 diabetes mellitus (HCC)    Past Surgical History:  Procedure Laterality Date   ANKLE SURGERY     Anteriorvesicourethropexy     BUNIONECTOMY     COLONOSCOPY     NASAL SINUS SURGERY     ORIF TIBIA PLATEAU Left 07/30/2022   Procedure: OPEN REDUCTION INTERNAL FIXATION (ORIF) TIBIAL PLATEAU;  Surgeon: Roby Lofts, MD;  Location: [facility] OR;  Service: Orthopedics;  Laterality: Left;   ORIF TIBIA PLATEAU Left 09/02/2022   Procedure: REVISION FIXATION OF LEFT TIBIAL PLATEAU FRACTURE;  Surgeon: Roby Lofts, MD;  Location: [facility] OR;  Service: Orthopedics;  Laterality: Left;   PATELLA FRACTURE SURGERY  2009   Right   TONSILLECTOMY     TOTAL ABDOMINAL HYSTERECTOMY     WRIST FRACTURE SURGERY  2009   Patient Active Problem List   Diagnosis Date Noted   Closed fracture of left tibial plateau 09/02/2022   Left tibial fracture 07/29/2022   Dyspnea on exertion 09/15/2020    CVA (cerebral vascular accident) (HCC) 04/20/2019   Pain in left hip 10/05/2016   Pain in right hip 10/05/2016   Trochanteric bursitis, right hip 10/05/2016   Trochanteric bursitis, left hip 10/05/2016   Chronic insomnia 01/28/2011   Seasonal and perennial allergic rhinitis 05/28/2010   Lung nodule 05/28/2010   HYPERLIPIDEMIA 11/23/2009   G E R D 11/23/2009   EUSTACHIAN TUBE DYSFUNCTION 11/18/2009   HYPERTENSION 11/18/2009   Unspecified hypothyroidism 03/14/2007   Obstructive sleep apnea 03/14/2007   FIBROMYALGIA 03/14/2007    PCP: Geoffry Paradise  REFERRING PROVIDER: Roby Lofts, MD  REFERRING DIAG: (L) FIBIAL PLATEAU FRACTURE ORIF (L) FIBIAL PLATEAU FX DOS( 09/02/22)-revision   THERAPY DIAG:  Left leg pain Difficulty in walking Muscle weakness   Rationale for Evaluation and Treatment: Rehabilitation  ONSET DATE: 07/30/22  SUBJECTIVE: PT states she has had no difficulty with the exercises given to her.   SUBJECTIVE STATEMENT: eval: PT states that she is having difficulty cleaning her house and cooking.  She has been trying not to use the walker as much at home but it makes her knee swell up.  States in the evening the pain is so bad that she is not  getting any sleep.  Pt was not using an assistive device prior to the fall.   PERTINENT HISTORY:  [name] is an 77 y.o. female with past medical history of CVA, hypertension, hyperlipidemia, GERD, OSA  Patient sustained a fall on Jul 29, 2022, resulting in a left tibial plateau fracture. Patient underwent ORIF of left tibial plateau by Dr. Jena Gauss on 07/30/2022. She was placed in a hinged knee brace postoperatively and instructed to be nonweightbearing on the left lower extremity. Patient has been compliant with these weightbearing precautions. When patient was evaluated at her postop appointment on 07/31/2022, she was found to have some loss of fixation of the fracture thus she had a second surgery on 09/02/22.  Pt states  that she had HH therapy that stopped about a week ago.   PAIN:  Are you having pain? Yes: NPRS scale: 0/10,  worst pain is an 8/10 mainly at night  Pain location: Lt knee down her shin  Pain description: throb  Aggravating factors: WB Relieving factors: rest   PRECAUTIONS: fall      WEIGHT BEARING RESTRICTIONS: WBAT  FALLS:  Has patient fallen in last 6 months? Yes. Number of falls 2  LIVING ENVIRONMENT: Lives with: lives with their family Lives in: House/apartment Stairs: No Has following equipment at home: Environmental consultant - 2 wheeled and stick   OCCUPATION: retired  PLOF: Independent  PATIENT GOALS: Get back to church, get back to doing her grocery shopping and her housework   NEXT MD VISIT: 12/23/22  OBJECTIVE:    PATIENT SURVEYS:  FOTO 28       SENSATION:hyper sensitive    EDEMA: normal for injury    PALPATION: Noted edema and tender to the touch   LOWER EXTREMITY ROM:  Active ROM Right eval Left eval  Hip flexion    Hip extension    Hip abduction    Hip adduction    Hip internal rotation    Hip external rotation    Knee flexion  110  Knee extension  Lacking 12  Ankle dorsiflexion    Ankle plantarflexion    Ankle inversion    Ankle eversion     (Blank rows = not tested)  LOWER EXTREMITY MMT:  MMT Right eval Left eval  Hip flexion 5 4+  Hip extension 3+ 3+  Hip abduction 5 4-  Hip adduction    Hip internal rotation    Hip external rotation    Knee flexion 4+ 4+  Knee extension 5 3  Ankle dorsiflexion 5 5  Ankle plantarflexion    Ankle inversion    Ankle eversion     (Blank rows = not tested)    FUNCTIONAL TESTS:  30 seconds chair stand test:  9 x using arms and walker.  Average for age and sex [age] sit to stand 16.7 seconds  2 minute walk test: with RW 324 feet.     TODAY'S TREATMENT:  DATE: 12/02/22: Leonia Reeves  with cane Heel raise x 10 Squat x 5 Toe raise x 10 Terminal extension x 10 Marching x 5 Tandem stance with head turns 5 reps with Rt in front and then Lt   12/01/22: Evaluation:  HEP how to start a desensitization program    PATIENT EDUCATION:  Education details: HEP, desensitization program  Person educated: Patient Education method: Explanation, Verbal cues, and Handouts Education comprehension: returned demonstration  HOME EXERCISE PROGRAM: Access Code:  URL: [email or web address] Date: 12/01/2022 Prepared by: Virgina Organ  Exercises - Seated Long Arc Quad  - 1 x daily - 7 x weekly - 3 sets - 10 reps - Supine Quadricep Sets  - 1 x daily - 7 x weekly - 3 sets - 10 reps - Active Straight Leg Raise with Quad Set  - 1 x daily - 7 x weekly - 3 sets - 10 reps - Supine Bridge  - 1 x daily - 7 x weekly - 3 sets - 10 reps - Supine Heel Slide  - 1 x daily - 7 x weekly - 3 sets - 10 reps 12/02/22 - Heel Raises with Counter Support  - 2 x daily - 7 x weekly - 1 sets - 10 reps - 5 hold - Mini Squat with Counter Support  - 2 x daily - 7 x weekly - 1 sets - 5-10 reps - 5 hold  ASSESSMENT:  CLINICAL IMPRESSION: Pt had to leave early from treatment as her husband had fell and she had to take him to the MD.  Goals and eval were not reviewed with pt please do so next treatment.  Pt comes to department with no assistive device.  Therapist instructed pt how to walk with a cane and stated that this would be preferred for long distances.  Added exercises to HEP.  PT unsteady with balance exercises and noted shaking with squats.  Pt continues to  demonstrates decreased activity tolerance, decreased ROM, decreased strength, decreased balance, increased edema and increased pain.  Ms. Ivy will benefit from skilled PT to address these issues and maximize her functional ability.    OBJECTIVE IMPAIRMENTS: Abnormal gait, decreased activity tolerance, decreased balance, difficulty  walking, decreased ROM, decreased strength, increased edema, increased fascial restrictions, impaired flexibility, and pain.   ACTIVITY LIMITATIONS: carrying, lifting, bending, sitting, squatting, sleeping, stairs, toileting, and locomotion level  PARTICIPATION LIMITATIONS: cleaning, laundry, driving, shopping, community activity, and church  PERSONAL FACTORS: Age, Fitness, and 1 comorbidity: CVA  are also affecting patient's functional outcome.   REHAB POTENTIAL: Good  CLINICAL DECISION MAKING: Stable/uncomplicated  EVALUATION COMPLEXITY: Moderate   GOALS: Goals reviewed with patient? No  SHORT TERM GOALS: Target date: 12/29/22 PT to be I in HEP in order to decrease her pain to no greater than a 5/10 to allow pt to get 3 hours of sleep  Baseline: Goal status: INITIAL  2.  Pt ROM to improve to 0-120 to allow pt to be able to walk with a normal gait.  Baseline:  Goal status: INITIAL  3.  Pt core and LE strength to increase 1/2 grade to allow to be able to come sit to stand from a chair without using her UE.  Baseline:  Goal status: INITIAL  4.  PT hyper sensitivity of LE to be decreased to allow a sheet to be over her leg without irritation.  Baseline:  Goal status: INITIAL    LONG TERM GOALS: Target date: 02/02/23  PT to be I in an  advanced  HEP in order to decrease her pain to no greater than a 2/10 to allow pt to get 6 hours of sleep Baseline:  Goal status: INITIAL  2.  Pt core and LE strength to increase 1 grade to allow to be able to go up and down 12 steps with the use of one handrail  Baseline:  Goal status: INITIAL  3.  Pt to be able to clean her house without difficulty  Baseline:  Goal status: INITIAL  4.  Pt to be able to cook a full meal without having to rest  Baseline:  Goal status: INITIAL  5.  PT to be going back to church services  Baseline:  Goal status: INITIAL   PLAN:  PT FREQUENCY: 2x/week  PT DURATION: 8 weeks  PLANNED  INTERVENTIONS: Therapeutic exercises, Therapeutic activity, Neuromuscular re-education, Balance training, Gait training, Patient/Family education, Self Care, Joint mobilization, and Manual therapy  PLAN FOR NEXT SESSION: Begin manual to decrease sensitivity as well as edema,  Continue with ROM, strengthening and balance exercises.  Attempt gt with cane.   Virgina Organ, PT CLT [phone]  12/02/2022, 9:37 AM

## 2022-12-03 ENCOUNTER — Encounter (HOSPITAL_COMMUNITY): Payer: Medicare HMO | Admitting: Physical Therapy

## 2022-12-07 ENCOUNTER — Encounter (HOSPITAL_COMMUNITY): Payer: Medicare HMO | Admitting: Physical Therapy

## 2022-12-08 ENCOUNTER — Ambulatory Visit (HOSPITAL_COMMUNITY): Payer: Medicare HMO | Admitting: Physical Therapy

## 2022-12-08 DIAGNOSIS — M79605 Pain in left leg: Secondary | ICD-10-CM

## 2022-12-08 DIAGNOSIS — R262 Difficulty in walking, not elsewhere classified: Secondary | ICD-10-CM | POA: Diagnosis not present

## 2022-12-08 DIAGNOSIS — M25662 Stiffness of left knee, not elsewhere classified: Secondary | ICD-10-CM | POA: Diagnosis not present

## 2022-12-08 DIAGNOSIS — M6281 Muscle weakness (generalized): Secondary | ICD-10-CM

## 2022-12-08 NOTE — Therapy (Signed)
OUTPATIENT PHYSICAL THERAPY LOWER EXTREMITY Treatment    Patient Name: [name] MRN: [id number] DOB:1945-06-14, 77 y.o., female Today's Date: 12/08/2022       END OF SESSION:   PT End of Session - 12/08/22 1149     Visit Number 3    Number of Visits 16    Date for PT Re-Evaluation 02/02/23    Authorization Type requested 12 visits from [place]    Authorization Time Period 12 visits approved 9/11-10/9    Authorization - Visit Number 3    Authorization - Number of Visits 12    Progress Note Due on Visit 10    PT Start Time 1104    PT Stop Time 1148    PT Time Calculation (min) 44 min    Equipment Utilized During Treatment Gait belt    Activity Tolerance Patient tolerated treatment well    Behavior During Therapy WFL for tasks assessed/performed             Past Medical History:  Diagnosis Date   Anxiety    Arthritis    Depression    DVT (deep venous thrombosis) (HCC) 08/02/2022   LLE 08/02/22   Essential hypertension    Eustachian tube dysfunction    Fibromyalgia    GERD (gastroesophageal reflux disease)    Hyperlipidemia    Hypothyroid    Mild carotid artery disease (HCC)    OSA (obstructive sleep apnea)    Osteopenia    Pneumonia    Stroke (HCC)    Type 2 diabetes mellitus (HCC)    Past Surgical History:  Procedure Laterality Date   ANKLE SURGERY     Anteriorvesicourethropexy     BUNIONECTOMY     COLONOSCOPY     NASAL SINUS SURGERY     ORIF TIBIA PLATEAU Left 07/30/2022   Procedure: OPEN REDUCTION INTERNAL FIXATION (ORIF) TIBIAL PLATEAU;  Surgeon: Roby Lofts, MD;  Location: [facility] OR;  Service: Orthopedics;  Laterality: Left;   ORIF TIBIA PLATEAU Left 09/02/2022   Procedure: REVISION FIXATION OF LEFT TIBIAL PLATEAU FRACTURE;  Surgeon: Roby Lofts, MD;  Location: [facility];  Service: Orthopedics;  Laterality: Left;   PATELLA FRACTURE SURGERY  2009   Right   TONSILLECTOMY     TOTAL ABDOMINAL HYSTERECTOMY     WRIST FRACTURE SURGERY  2009    Patient Active Problem List   Diagnosis Date Noted   Closed fracture of left tibial plateau 09/02/2022   Left tibial fracture 07/29/2022   Dyspnea on exertion 09/15/2020   CVA (cerebral vascular accident) (HCC) 04/20/2019   Pain in left hip 10/05/2016   Pain in right hip 10/05/2016   Trochanteric bursitis, right hip 10/05/2016   Trochanteric bursitis, left hip 10/05/2016   Chronic insomnia 01/28/2011   Seasonal and perennial allergic rhinitis 05/28/2010   Lung nodule 05/28/2010   HYPERLIPIDEMIA 11/23/2009   G E R D 11/23/2009   EUSTACHIAN TUBE DYSFUNCTION 11/18/2009   HYPERTENSION 11/18/2009   Unspecified hypothyroidism 03/14/2007   Obstructive sleep apnea 03/14/2007   FIBROMYALGIA 03/14/2007    PCP: Geoffry Paradise  REFERRING PROVIDER: Roby Lofts, MD  REFERRING DIAG: (L) FIBIAL PLATEAU FRACTURE ORIF (L) FIBIAL PLATEAU FX DOS( 09/02/22)-revision   THERAPY DIAG:  Left leg pain Difficulty in walking Muscle weakness   Rationale for Evaluation and Treatment: Rehabilitation  ONSET DATE: 07/30/22  SUBJECTIVE: PT states she is compliant with HEP.  Same amount of pain today.   SUBJECTIVE STATEMENT:  eval:  PT states that  she is having difficulty cleaning her house and cooking.  She has been trying not to use the walker as much at home but it makes her knee swell up.  States in the evening the pain is so bad that she is not getting any sleep.  Pt was not using an assistive device prior to the fall.   PERTINENT HISTORY:  [name] is an 77 y.o. female with past medical history of CVA, hypertension, hyperlipidemia, GERD, OSA  Patient sustained a fall on Jul 29, 2022, resulting in a left tibial plateau fracture. Patient underwent ORIF of left tibial plateau by Dr. Jena Gauss on 07/30/2022. She was placed in a hinged knee brace postoperatively and instructed to be nonweightbearing on the left lower extremity. Patient has been compliant with these weightbearing precautions.  When patient was evaluated at her postop appointment on 07/31/2022, she was found to have some loss of fixation of the fracture thus she had a second surgery on 09/02/22.  Pt states that she had HH therapy that stopped about a week ago.   PAIN:  Are you having pain? Yes: NPRS scale: 0/10,  worst pain is an 8/10 mainly at night  Pain location: Lt knee down her shin  Pain description: throb  Aggravating factors: WB Relieving factors: rest   PRECAUTIONS: fall      WEIGHT BEARING RESTRICTIONS: WBAT  FALLS:  Has patient fallen in last 6 months? Yes. Number of falls 2  LIVING ENVIRONMENT: Lives with: lives with their family Lives in: House/apartment Stairs: No Has following equipment at home: Environmental consultant - 2 wheeled and stick   OCCUPATION: retired  PLOF: Independent  PATIENT GOALS: Get back to church, get back to doing her grocery shopping and her housework   NEXT MD VISIT: 12/23/22  OBJECTIVE:    PATIENT SURVEYS:  FOTO 28       SENSATION:hyper sensitive    EDEMA: normal for injury    PALPATION: Noted edema and tender to the touch   LOWER EXTREMITY ROM:  Active ROM Right eval Left eval  Hip flexion    Hip extension    Hip abduction    Hip adduction    Hip internal rotation    Hip external rotation    Knee flexion  110  Knee extension  Lacking 12  Ankle dorsiflexion    Ankle plantarflexion    Ankle inversion    Ankle eversion     (Blank rows = not tested)  LOWER EXTREMITY MMT:  MMT Right eval Left eval  Hip flexion 5 4+  Hip extension 3+ 3+  Hip abduction 5 4-  Hip adduction    Hip internal rotation    Hip external rotation    Knee flexion 4+ 4+  Knee extension 5 3  Ankle dorsiflexion 5 5  Ankle plantarflexion    Ankle inversion    Ankle eversion     (Blank rows = not tested)    FUNCTIONAL TESTS:  30 seconds chair stand test:  9 x using arms and walker.  Average for age and sex [age] sit to stand 16.7 seconds  2 minute walk test: with RW 324  feet.     TODAY'S TREATMENT:  DATE: 12/08/22 Standing:  heelraises 20X  Mini squat with intermittent HHA 2X10  March high holds alternating 2X10  Forward lunges no UE assist 10X bil  Lt knee flexion 10X  Hip abduction bil 10X each  Hip extension bil 10X each Sit to stands 10X no UE assist Manual supine with elevation to reduce edema and scar tissue   12/02/22: Gt with cane Heel raise x 10 Squat x 5 Toe raise x 10 Terminal extension x 10 Marching x 5 Tandem stance with head turns 5 reps with Rt in front and then Lt   12/01/22: Evaluation:  HEP how to start a desensitization program    PATIENT EDUCATION:  Education details: HEP, desensitization program  Person educated: Patient Education method: Explanation, Verbal cues, and Handouts Education comprehension: returned demonstration  HOME EXERCISE PROGRAM: Access Code:  URL: [email or web address] Date: 12/01/2022 Prepared by: Virgina Organ  Exercises - Seated Long Arc Quad  - 1 x daily - 7 x weekly - 3 sets - 10 reps - Supine Quadricep Sets  - 1 x daily - 7 x weekly - 3 sets - 10 reps - Active Straight Leg Raise with Quad Set  - 1 x daily - 7 x weekly - 3 sets - 10 reps - Supine Bridge  - 1 x daily - 7 x weekly - 3 sets - 10 reps - Supine Heel Slide  - 1 x daily - 7 x weekly - 3 sets - 10 reps 12/02/22 - Heel Raises with Counter Support  - 2 x daily - 7 x weekly - 1 sets - 10 reps - 5 hold - Mini Squat with Counter Support  - 2 x daily - 7 x weekly - 1 sets - 5-10 reps - 5 hold  ASSESSMENT:  CLINICAL IMPRESSION: Continued focus on improving LE strength and stability . Decreased UE assist where able with 1 to no UE assist, intermittently.  Added more exercises to challenge balance and increased sets of established therex today.  Hip strengthening also began this session. Encouraged  to wear thigh high compression stockings that she has at home for edema and reduce pain.  Added manual this session to also help with this.  Retro techniques as well as scar massage completed.  Pt will benefit from skilled PT to address these issues and maximize her functional ability.    OBJECTIVE IMPAIRMENTS: Abnormal gait, decreased activity tolerance, decreased balance, difficulty walking, decreased ROM, decreased strength, increased edema, increased fascial restrictions, impaired flexibility, and pain.   ACTIVITY LIMITATIONS: carrying, lifting, bending, sitting, squatting, sleeping, stairs, toileting, and locomotion level  PARTICIPATION LIMITATIONS: cleaning, laundry, driving, shopping, community activity, and church  PERSONAL FACTORS: Age, Fitness, and 1 comorbidity: CVA  are also affecting patient's functional outcome.   REHAB POTENTIAL: Good  CLINICAL DECISION MAKING: Stable/uncomplicated  EVALUATION COMPLEXITY: Moderate   GOALS: Goals reviewed with patient? No  SHORT TERM GOALS: Target date: 12/29/22 PT to be I in HEP in order to decrease her pain to no greater than a 5/10 to allow pt to get 3 hours of sleep  Baseline: Goal status: INITIAL  2.  Pt ROM to improve to 0-120 to allow pt to be able to walk with a normal gait.  Baseline:  Goal status: INITIAL  3.  Pt core and LE strength to increase 1/2 grade to allow to be able to come sit to stand from a chair without using her UE.  Baseline:  Goal status: INITIAL  4.  PT hyper sensitivity  of LE to be decreased to allow a sheet to be over her leg without irritation.  Baseline:  Goal status: INITIAL    LONG TERM GOALS: Target date: 02/02/23  PT to be I in an advanced  HEP in order to decrease her pain to no greater than a 2/10 to allow pt to get 6 hours of sleep Baseline:  Goal status: INITIAL  2.  Pt core and LE strength to increase 1 grade to allow to be able to go up and down 12 steps with the use of one handrail   Baseline:  Goal status: INITIAL  3.  Pt to be able to clean her house without difficulty  Baseline:  Goal status: INITIAL  4.  Pt to be able to cook a full meal without having to rest  Baseline:  Goal status: INITIAL  5.  PT to be going back to church services  Baseline:  Goal status: INITIAL   PLAN:  PT FREQUENCY: 2x/week  PT DURATION: 8 weeks  PLANNED INTERVENTIONS: Therapeutic exercises, Therapeutic activity, Neuromuscular re-education, Balance training, Gait training, Patient/Family education, Self Care, Joint mobilization, and Manual therapy  PLAN FOR NEXT SESSION: Continue manual to decrease sensitivity, edema and scar tissue.  Progress strengthening and balance exercises.  Update HEP.   Lurena Nida, PTA/CLT [facility] Ph: [phone]   12/08/2022, 11:50 AM

## 2022-12-09 ENCOUNTER — Ambulatory Visit (HOSPITAL_COMMUNITY): Payer: Medicare HMO | Admitting: Physical Therapy

## 2022-12-09 DIAGNOSIS — M25662 Stiffness of left knee, not elsewhere classified: Secondary | ICD-10-CM

## 2022-12-09 DIAGNOSIS — R262 Difficulty in walking, not elsewhere classified: Secondary | ICD-10-CM

## 2022-12-09 DIAGNOSIS — M79605 Pain in left leg: Secondary | ICD-10-CM | POA: Diagnosis not present

## 2022-12-09 DIAGNOSIS — M6281 Muscle weakness (generalized): Secondary | ICD-10-CM

## 2022-12-09 NOTE — Therapy (Signed)
OUTPATIENT PHYSICAL THERAPY LOWER EXTREMITY Treatment    Patient Name: [name] MRN: [id number] DOB:27-Feb-1946, 77 y.o., female Today's Date: 12/09/2022       END OF SESSION:   PT End of Session - 12/09/22 0928     Visit Number 4    Number of Visits 16    Date for PT Re-Evaluation 02/02/23    Authorization Type requested 12 visits from [place]    Authorization Time Period 12 visits approved 9/11-10/9    Authorization - Visit Number 4    Authorization - Number of Visits 12    Progress Note Due on Visit 10    PT Start Time 0847    PT Stop Time 0926    PT Time Calculation (min) 39 min    Equipment Utilized During Treatment Gait belt    Activity Tolerance Patient tolerated treatment well    Behavior During Therapy WFL for tasks assessed/performed              Past Medical History:  Diagnosis Date   Anxiety    Arthritis    Depression    DVT (deep venous thrombosis) (HCC) 08/02/2022   LLE 08/02/22   Essential hypertension    Eustachian tube dysfunction    Fibromyalgia    GERD (gastroesophageal reflux disease)    Hyperlipidemia    Hypothyroid    Mild carotid artery disease (HCC)    OSA (obstructive sleep apnea)    Osteopenia    Pneumonia    Stroke (HCC)    Type 2 diabetes mellitus (HCC)    Past Surgical History:  Procedure Laterality Date   ANKLE SURGERY     Anteriorvesicourethropexy     BUNIONECTOMY     COLONOSCOPY     NASAL SINUS SURGERY     ORIF TIBIA PLATEAU Left 07/30/2022   Procedure: OPEN REDUCTION INTERNAL FIXATION (ORIF) TIBIAL PLATEAU;  Surgeon: Roby Lofts, MD;  Location: [facility];  Service: Orthopedics;  Laterality: Left;   ORIF TIBIA PLATEAU Left 09/02/2022   Procedure: REVISION FIXATION OF LEFT TIBIAL PLATEAU FRACTURE;  Surgeon: Roby Lofts, MD;  Location: [facility] OR;  Service: Orthopedics;  Laterality: Left;   PATELLA FRACTURE SURGERY  2009   Right   TONSILLECTOMY     TOTAL ABDOMINAL HYSTERECTOMY     WRIST FRACTURE SURGERY  2009    Patient Active Problem List   Diagnosis Date Noted   Closed fracture of left tibial plateau 09/02/2022   Left tibial fracture 07/29/2022   Dyspnea on exertion 09/15/2020   CVA (cerebral vascular accident) (HCC) 04/20/2019   Pain in left hip 10/05/2016   Pain in right hip 10/05/2016   Trochanteric bursitis, right hip 10/05/2016   Trochanteric bursitis, left hip 10/05/2016   Chronic insomnia 01/28/2011   Seasonal and perennial allergic rhinitis 05/28/2010   Lung nodule 05/28/2010   HYPERLIPIDEMIA 11/23/2009   G E R D 11/23/2009   EUSTACHIAN TUBE DYSFUNCTION 11/18/2009   HYPERTENSION 11/18/2009   Unspecified hypothyroidism 03/14/2007   Obstructive sleep apnea 03/14/2007   FIBROMYALGIA 03/14/2007    PCP: Geoffry Paradise  REFERRING PROVIDER: Roby Lofts, MD  REFERRING DIAG: (L) FIBIAL PLATEAU FRACTURE ORIF (L) FIBIAL PLATEAU FX DOS( 09/02/22)-revision   THERAPY DIAG:  Left leg pain Difficulty in walking Muscle weakness   Rationale for Evaluation and Treatment: Rehabilitation  ONSET DATE: 07/30/22  SUBJECTIVE: PT states she is compliant with HEP.  Same amount of pain today.   SUBJECTIVE STATEMENT: Pt states that she did  not have any pain last night.   eval:  PT states that she is having difficulty cleaning her house and cooking.  She has been trying not to use the walker as much at home but it makes her knee swell up.  States in the evening the pain is so bad that she is not getting any sleep.  Pt was not using an assistive device prior to the fall.   PERTINENT HISTORY:  [name] is an 77 y.o. female with past medical history of CVA, hypertension, hyperlipidemia, GERD, OSA  Patient sustained a fall on Jul 29, 2022, resulting in a left tibial plateau fracture. Patient underwent ORIF of left tibial plateau by Dr. Jena Gauss on 07/30/2022. She was placed in a hinged knee brace postoperatively and instructed to be nonweightbearing on the left lower extremity. Patient has  been compliant with these weightbearing precautions. When patient was evaluated at her postop appointment on 07/31/2022, she was found to have some loss of fixation of the fracture thus she had a second surgery on 09/02/22.  Pt states that she had HH therapy that stopped about a week ago.   PAIN:  Are you having pain? Yes: NPRS scale: 0/10,  worst pain is an 8/10 mainly at night  Pain location: Lt knee down her shin  Pain description: throb  Aggravating factors: WB Relieving factors: rest   PRECAUTIONS: fall      WEIGHT BEARING RESTRICTIONS: WBAT  FALLS:  Has patient fallen in last 6 months? Yes. Number of falls 2  LIVING ENVIRONMENT: Lives with: lives with their family Lives in: House/apartment Stairs: No Has following equipment at home: Environmental consultant - 2 wheeled and stick   OCCUPATION: retired  PLOF: Independent  PATIENT GOALS: Get back to church, get back to doing her grocery shopping and her housework   NEXT MD VISIT: 12/23/22  OBJECTIVE:    PATIENT SURVEYS:  FOTO 28       SENSATION:hyper sensitive    EDEMA: normal for injury    PALPATION: Noted edema and tender to the touch   LOWER EXTREMITY ROM:  Active ROM Right eval Left eval  Hip flexion    Hip extension    Hip abduction    Hip adduction    Hip internal rotation    Hip external rotation    Knee flexion  110  Knee extension  Lacking 12  Ankle dorsiflexion    Ankle plantarflexion    Ankle inversion    Ankle eversion     (Blank rows = not tested)  LOWER EXTREMITY MMT:  MMT Right eval Left eval  Hip flexion 5 4+  Hip extension 3+ 3+  Hip abduction 5 4-  Hip adduction    Hip internal rotation    Hip external rotation    Knee flexion 4+ 4+  Knee extension 5 3  Ankle dorsiflexion 5 5  Ankle plantarflexion    Ankle inversion    Ankle eversion     (Blank rows = not tested)    FUNCTIONAL TESTS:  30 seconds chair stand test:  9 x using arms and walker.  Average for age and sex 12 5 sit to  stand 16.7 seconds  2 minute walk test: with RW 324 feet.     TODAY'S TREATMENT:  DATE: 12/09/22: Heel raises x 15  Squat x 15  Hip abduction bil 15X each with red theraband  Hip extension bil 15X each with red theraband  Hip back diagonals x 15 with red theraband  Tandem stance with head turns x 5  4" step up bil x 10  Sits to stand x 5 Terminal extension with red theraband x 15  12/08/22 Standing:  heelraises 20X  Mini squat with intermittent HHA 2X10  March high holds alternating 2X10  Forward lunges no UE assist 10X bil  Lt knee flexion 10X  Hip abduction bil 10X each  Hip extension bil 10X each Sit to stands 10X no UE assist Manual supine with elevation to reduce edema and scar tissue   12/02/22: Gt with cane Heel raise x 10 Squat x 5 Toe raise x 10 Terminal extension x 10 Marching x 5 Tandem stance with head turns 5 reps with Rt in front and then Lt   12/01/22: Evaluation:  HEP how to start a desensitization program    PATIENT EDUCATION:  Education details: HEP, desensitization program  Person educated: Patient Education method: Explanation, Verbal cues, and Handouts Education comprehension: returned demonstration  HOME EXERCISE PROGRAM: Access Code:  URL: [email or web address] Date: 12/01/2022 Prepared by: Virgina Organ  Exercises - Seated Long Arc Quad  - 1 x daily - 7 x weekly - 3 sets - 10 reps - Supine Quadricep Sets  - 1 x daily - 7 x weekly - 3 sets - 10 reps - Active Straight Leg Raise with Quad Set  - 1 x daily - 7 x weekly - 3 sets - 10 reps - Supine Bridge  - 1 x daily - 7 x weekly - 3 sets - 10 reps - Supine Heel Slide  - 1 x daily - 7 x weekly - 3 sets - 10 reps 12/02/22 - Heel Raises with Counter Support  - 2 x daily - 7 x weekly - 1 sets - 10 reps - 5 hold - Mini Squat with Counter Support  - 2 x daily  - 7 x weekly - 1 sets - 5-10 reps - 5 hold             9/19 Access Code: [email or web address] URL: [email or web address] Date: 12/09/2022 Prepared by: Virgina Organ  Exercises - Standing 3-Way Leg Reach with Resistance at Ankles and Unilateral Counter Support  - 1 x daily - 7 x weekly - 1 sets - 15 reps - 5seconds  hold ASSESSMENT:  CLINICAL IMPRESSION: Pt comes wearing compression garment.  Therapist added theraband to hip strengthening as well as step up for functional strength.  Pt will benefit from skilled PT to address these issues and maximize her functional ability.    OBJECTIVE IMPAIRMENTS: Abnormal gait, decreased activity tolerance, decreased balance, difficulty walking, decreased ROM, decreased strength, increased edema, increased fascial restrictions, impaired flexibility, and pain.   ACTIVITY LIMITATIONS: carrying, lifting, bending, sitting, squatting, sleeping, stairs, toileting, and locomotion level  PARTICIPATION LIMITATIONS: cleaning, laundry, driving, shopping, community activity, and church  PERSONAL FACTORS: Age, Fitness, and 1 comorbidity: CVA  are also affecting patient's functional outcome.   REHAB POTENTIAL: Good  CLINICAL DECISION MAKING: Stable/uncomplicated  EVALUATION COMPLEXITY: Moderate   GOALS: Goals reviewed with patient? No  SHORT TERM GOALS: Target date: 12/29/22 PT to be I in HEP in order to decrease her pain to no greater than a 5/10 to allow pt to get 3 hours of sleep  Baseline: Goal status: on-going   2.  Pt ROM to improve to 0-120 to allow pt to be able to walk with a normal gait.  Baseline:  Goal status: on-going  3.  Pt core and LE strength to increase 1/2 grade to allow to be able to come sit to stand from a chair without using her UE.  Baseline:  Goal status: on-going  4.  PT hyper sensitivity of LE to be decreased to allow a sheet to be over her leg without irritation.  Baseline:  Goal status: on-going    LONG TERM  GOALS: Target date: 02/02/23  PT to be I in an advanced  HEP in order to decrease her pain to no greater than a 2/10 to allow pt to get 6 hours of sleep Baseline:  Goal status:on-going  2.  Pt core and LE strength to increase 1 grade to allow to be able to go up and down 12 steps with the use of one handrail  Baseline:  Goal status: on-going  3.  Pt to be able to clean her house without difficulty  Baseline:  Goal status: on-going  4.  Pt to be able to cook a full meal without having to rest  Baseline:  Goal status: on-going  5.  PT to be going back to church services  Baseline:  Goal status: on-going   PLAN:  PT FREQUENCY: 2x/week  PT DURATION: 8 weeks  PLANNED INTERVENTIONS: Therapeutic exercises, Therapeutic activity, Neuromuscular re-education, Balance training, Gait training, Patient/Family education, Self Care, Joint mobilization, and Manual therapy  PLAN FOR NEXT SESSION: .continue to  Progress strengthening and balance exercises.  Continue manual if needed at this time pt states no evening pain.    Virgina Organ, PT CLT [phone] [facility] Ph: [phone]   12/09/2022, 9:29 AM

## 2022-12-13 ENCOUNTER — Ambulatory Visit (HOSPITAL_COMMUNITY): Payer: Medicare HMO | Admitting: Physical Therapy

## 2022-12-13 DIAGNOSIS — M79605 Pain in left leg: Secondary | ICD-10-CM

## 2022-12-13 DIAGNOSIS — M25662 Stiffness of left knee, not elsewhere classified: Secondary | ICD-10-CM | POA: Diagnosis not present

## 2022-12-13 DIAGNOSIS — R262 Difficulty in walking, not elsewhere classified: Secondary | ICD-10-CM

## 2022-12-13 DIAGNOSIS — M6281 Muscle weakness (generalized): Secondary | ICD-10-CM

## 2022-12-13 NOTE — Therapy (Signed)
OUTPATIENT PHYSICAL THERAPY LOWER EXTREMITY Treatment    Patient Name: [name] MRN: [id number] DOB:1946-01-20, 77 y.o., female Today's Date: 12/13/2022       END OF SESSION:   PT End of Session - 12/13/22 1025     Visit Number 5    Number of Visits 16    Date for PT Re-Evaluation 02/02/23    Authorization Type requested 12 visits from [place]    Authorization Time Period 12 visits approved 9/11-10/9    Authorization - Visit Number 5    Authorization - Number of Visits 12    Progress Note Due on Visit 10    PT Start Time 0845    PT Stop Time 0925    PT Time Calculation (min) 40 min    Equipment Utilized During Treatment Gait belt    Activity Tolerance Patient tolerated treatment well    Behavior During Therapy WFL for tasks assessed/performed               Past Medical History:  Diagnosis Date   Anxiety    Arthritis    Depression    DVT (deep venous thrombosis) (HCC) 08/02/2022   LLE 08/02/22   Essential hypertension    Eustachian tube dysfunction    Fibromyalgia    GERD (gastroesophageal reflux disease)    Hyperlipidemia    Hypothyroid    Mild carotid artery disease (HCC)    OSA (obstructive sleep apnea)    Osteopenia    Pneumonia    Stroke (HCC)    Type 2 diabetes mellitus (HCC)    Past Surgical History:  Procedure Laterality Date   ANKLE SURGERY     Anteriorvesicourethropexy     BUNIONECTOMY     COLONOSCOPY     NASAL SINUS SURGERY     ORIF TIBIA PLATEAU Left 07/30/2022   Procedure: OPEN REDUCTION INTERNAL FIXATION (ORIF) TIBIAL PLATEAU;  Surgeon: Hannah Lofts, MD;  Location: [facility];  Service: Orthopedics;  Laterality: Left;   ORIF TIBIA PLATEAU Left 09/02/2022   Procedure: REVISION FIXATION OF LEFT TIBIAL PLATEAU FRACTURE;  Surgeon: Hannah Lofts, MD;  Location: [facility];  Service: Orthopedics;  Laterality: Left;   PATELLA FRACTURE SURGERY  2009   Right   TONSILLECTOMY     TOTAL ABDOMINAL HYSTERECTOMY     WRIST FRACTURE SURGERY  2009    Patient Active Problem List   Diagnosis Date Noted   Closed fracture of left tibial plateau 09/02/2022   Left tibial fracture 07/29/2022   Dyspnea on exertion 09/15/2020   CVA (cerebral vascular accident) (HCC) 04/20/2019   Pain in left hip 10/05/2016   Pain in right hip 10/05/2016   Trochanteric bursitis, right hip 10/05/2016   Trochanteric bursitis, left hip 10/05/2016   Chronic insomnia 01/28/2011   Seasonal and perennial allergic rhinitis 05/28/2010   Lung nodule 05/28/2010   HYPERLIPIDEMIA 11/23/2009   G E R D 11/23/2009   EUSTACHIAN TUBE DYSFUNCTION 11/18/2009   HYPERTENSION 11/18/2009   Unspecified hypothyroidism 03/14/2007   Obstructive sleep apnea 03/14/2007   FIBROMYALGIA 03/14/2007    PCP: [name]  REFERRING PROVIDER: Roby Lofts, MD  REFERRING DIAG: (L) FIBIAL PLATEAU FRACTURE ORIF (L) FIBIAL PLATEAU FX DOS( 09/02/22)-revision   THERAPY DIAG:  Left leg pain Difficulty in walking Muscle weakness   Rationale for Evaluation and Treatment: Rehabilitation  ONSET DATE: 07/30/22  SUBJECTIVE: PT states she is compliant with HEP.  Same amount of pain today.   SUBJECTIVE STATEMENT:  Pt currently without  any issues.  States she's been walking in her house without AD and doing well.  Comes today using quad cane.  eval:  PT states that she is having difficulty cleaning her house and cooking.  She has been trying not to use the walker as much at home but it makes her knee swell up.  States in the evening the pain is so bad that she is not getting any sleep.  Pt was not using an assistive device prior to the fall.   PERTINENT HISTORY:  [name] is an 77 y.o. female with past medical history of CVA, hypertension, hyperlipidemia, GERD, OSA  Patient sustained a fall on Jul 29, 2022, resulting in a left tibial plateau fracture. Patient underwent ORIF of left tibial plateau by Dr. Jena [name] on 07/30/2022. She was placed in a hinged knee brace postoperatively  and instructed to be nonweightbearing on the left lower extremity. Patient has been compliant with these weightbearing precautions. When patient was evaluated at her postop appointment on 07/31/2022, she was found to have some loss of fixation of the fracture thus she had a second surgery on 09/02/22.  Pt states that she had HH therapy that stopped about a week ago.   PAIN:  Are you having pain? Yes: NPRS scale: 0/10,  worst pain is an 8/10 mainly at night  Pain location: Lt knee down her shin  Pain description: throb  Aggravating factors: WB Relieving factors: rest   PRECAUTIONS: fall      WEIGHT BEARING RESTRICTIONS: WBAT  FALLS:  Has patient fallen in last 6 months? Yes. Number of falls 2  LIVING ENVIRONMENT: Lives with: lives with their family Lives in: House/apartment Stairs: No Has following equipment at home: Environmental consultant - 2 wheeled and stick   OCCUPATION: retired  PLOF: Independent  PATIENT GOALS: Get back to church, get back to doing her grocery shopping and her housework   NEXT MD VISIT: 12/23/22  OBJECTIVE:    PATIENT SURVEYS:  FOTO 28       SENSATION:hyper sensitive    EDEMA: normal for injury    PALPATION: Noted edema and tender to the touch   LOWER EXTREMITY ROM:  Active ROM Right eval Left eval  Hip flexion    Hip extension    Hip abduction    Hip adduction    Hip internal rotation    Hip external rotation    Knee flexion  110  Knee extension  Lacking 12  Ankle dorsiflexion    Ankle plantarflexion    Ankle inversion    Ankle eversion     (Blank rows = not tested)  LOWER EXTREMITY MMT:  MMT Right eval Left eval  Hip flexion 5 4+  Hip extension 3+ 3+  Hip abduction 5 4-  Hip adduction    Hip internal rotation    Hip external rotation    Knee flexion 4+ 4+  Knee extension 5 3  Ankle dorsiflexion 5 5  Ankle plantarflexion    Ankle inversion    Ankle eversion     (Blank rows = not tested)    FUNCTIONAL TESTS:  30 seconds  chair stand test:  9 x using arms and walker.  Average for age and sex [age] sit to stand 16.7 seconds  2 minute walk test: with RW 324 feet.     TODAY'S TREATMENT:  DATE: 12/13/22: Heel raises x 20  Squat x 20  Hip abduction bil 20X each with red theraband  Hip extension bil 20X each with red theraband  Hip back diagonals 20X with red theraband  Tandem stance with head turns 10X  4" forward step up bil 2X10 4" lateral step downs bil 2X10 Sits to stand 10X no UE Terminal extension with 2PL bodycraft  12/09/22: Heel raises x 15  Squat x 15  Hip abduction bil 15X each with red theraband  Hip extension bil 15X each with red theraband  Hip back diagonals x 15 with red theraband  Tandem stance with head turns x 5  4" step up bil x 10  Sits to stand x 5 Terminal extension with red theraband x 15   12/08/22 Standing:  heelraises 20X  Mini squat with intermittent HHA 2X10  March high holds alternating 2X10  Forward lunges no UE assist 10X bil  Lt knee flexion 10X  Hip abduction bil 10X each  Hip extension bil 10X each Sit to stands 10X no UE assist Manual supine with elevation to reduce edema and scar tissue   12/02/22: Gt with cane Heel raise x 10 Squat x 5 Toe raise x 10 Terminal extension x 10 Marching x 5 Tandem stance with head turns 5 reps with Rt in front and then Lt   12/01/22: Evaluation:  HEP how to start a desensitization program    PATIENT EDUCATION:  Education details: HEP, desensitization program  Person educated: Patient Education method: Explanation, Verbal cues, and Handouts Education comprehension: returned demonstration  HOME EXERCISE PROGRAM: Access Code:  URL: [email or web address]  Date: 12/01/2022 - Seated Long Arc Quad  - 1 x daily - 7 x weekly - 3 sets - 10 reps - Supine Quadricep Sets  - 1 x daily - 7 x  weekly - 3 sets - 10 reps - Active Straight Leg Raise with Quad Set  - 1 x daily - 7 x weekly - 3 sets - 10 reps - Supine Bridge  - 1 x daily - 7 x weekly - 3 sets - 10 reps - Supine Heel Slide  - 1 x daily - 7 x weekly - 3 sets - 10 reps  12/02/22 - Heel Raises with Counter Support  - 2 x daily - 7 x weekly - 1 sets - 10 reps - 5 hold - Mini Squat with Counter Support  - 2 x daily - 7 x weekly - 1 sets - 5-10 reps - 5 hold              9/19 Exercises - Standing 3-Way Leg Reach with Resistance at Ankles and Unilateral Counter Support  - 1 x daily - 7 x weekly - 1 sets - 15 reps - 5seconds  hold  ASSESSMENT:  CLINICAL IMPRESSION: Pt still with pain/discomfort but getting stronger and walking now with quad cane. Able to increase all reps of exercises today without issues.  Generally good form/technique with little cues needed. Used bodycraft for TKE today rather than bands for resistance today. Minimal rest needed this session and no complaints of pain.   Pt will continue to benefit from skilled PT to address deficits and maximize her functional ability.    OBJECTIVE IMPAIRMENTS: Abnormal gait, decreased activity tolerance, decreased balance, difficulty walking, decreased ROM, decreased strength, increased edema, increased fascial restrictions, impaired flexibility, and pain.   ACTIVITY LIMITATIONS: carrying, lifting, bending, sitting, squatting, sleeping, stairs, toileting, and locomotion level  PARTICIPATION LIMITATIONS: cleaning,  laundry, driving, shopping, community activity, and church  PERSONAL FACTORS: Age, Fitness, and 1 comorbidity: CVA  are also affecting patient's functional outcome.   REHAB POTENTIAL: Good  CLINICAL DECISION MAKING: Stable/uncomplicated  EVALUATION COMPLEXITY: Moderate   GOALS: Goals reviewed with patient? No  SHORT TERM GOALS: Target date: 12/29/22 PT to be I in HEP in order to decrease her pain to no greater than a 5/10 to allow pt to get 3 hours of  sleep  Baseline: Goal status: on-going   2.  Pt ROM to improve to 0-120 to allow pt to be able to walk with a normal gait.  Baseline:  Goal status: on-going  3.  Pt core and LE strength to increase 1/2 grade to allow to be able to come sit to stand from a chair without using her UE.  Baseline:  Goal status: on-going  4.  PT hyper sensitivity of LE to be decreased to allow a sheet to be over her leg without irritation.  Baseline:  Goal status: on-going    LONG TERM GOALS: Target date: 02/02/23  PT to be I in an advanced  HEP in order to decrease her pain to no greater than a 2/10 to allow pt to get 6 hours of sleep Baseline:  Goal status:on-going  2.  Pt core and LE strength to increase 1 grade to allow to be able to go up and down 12 steps with the use of one handrail  Baseline:  Goal status: on-going  3.  Pt to be able to clean her house without difficulty  Baseline:  Goal status: on-going  4.  Pt to be able to cook a full meal without having to rest  Baseline:  Goal status: on-going  5.  PT to be going back to church services  Baseline:  Goal status: on-going   PLAN:  PT FREQUENCY: 2x/week  PT DURATION: 8 weeks  PLANNED INTERVENTIONS: Therapeutic exercises, Therapeutic activity, Neuromuscular re-education, Balance training, Gait training, Patient/Family education, Self Care, Joint mobilization, and Manual therapy  PLAN FOR NEXT SESSION: .continue to  Progress strengthening and balance exercises.  Continue manual if needed at this time pt states no evening pain.    Lurena Nida, PTA/CLT [facility] Ph: [phone]   12/13/2022, 10:25 AM

## 2022-12-15 ENCOUNTER — Ambulatory Visit (HOSPITAL_COMMUNITY): Payer: Medicare HMO | Admitting: Physical Therapy

## 2022-12-15 DIAGNOSIS — R262 Difficulty in walking, not elsewhere classified: Secondary | ICD-10-CM | POA: Diagnosis not present

## 2022-12-15 DIAGNOSIS — M6281 Muscle weakness (generalized): Secondary | ICD-10-CM | POA: Diagnosis not present

## 2022-12-15 DIAGNOSIS — M79605 Pain in left leg: Secondary | ICD-10-CM | POA: Diagnosis not present

## 2022-12-15 DIAGNOSIS — M25662 Stiffness of left knee, not elsewhere classified: Secondary | ICD-10-CM

## 2022-12-15 NOTE — Therapy (Signed)
OUTPATIENT PHYSICAL THERAPY LOWER EXTREMITY Treatment    Patient Name: Allan Urbaniak Hsiao MRN: [id number] DOB:1945/06/11, 77 y.o., female Today's Date: 12/15/2022       END OF SESSION:   PT End of Session - 12/15/22 0855     Visit Number 6    Number of Visits 16    Date for PT Re-Evaluation 02/02/23    Authorization Type requested 12 visits from [place]    Authorization Time Period 12 visits approved 9/11-10/9    Authorization - Visit Number 6    Authorization - Number of Visits 12    Progress Note Due on Visit 10    PT Start Time 0848    PT Stop Time 0930    PT Time Calculation (min) 42 min    Equipment Utilized During Treatment Gait belt    Activity Tolerance Patient tolerated treatment well    Behavior During Therapy WFL for tasks assessed/performed               Past Medical History:  Diagnosis Date   Anxiety    Arthritis    Depression    DVT (deep venous thrombosis) (HCC) 08/02/2022   LLE 08/02/22   Essential hypertension    Eustachian tube dysfunction    Fibromyalgia    GERD (gastroesophageal reflux disease)    Hyperlipidemia    Hypothyroid    Mild carotid artery disease (HCC)    OSA (obstructive sleep apnea)    Osteopenia    Pneumonia    Stroke (HCC)    Type 2 diabetes mellitus (HCC)    Past Surgical History:  Procedure Laterality Date   ANKLE SURGERY     Anteriorvesicourethropexy     BUNIONECTOMY     COLONOSCOPY     NASAL SINUS SURGERY     ORIF TIBIA PLATEAU Left 07/30/2022   Procedure: OPEN REDUCTION INTERNAL FIXATION (ORIF) TIBIAL PLATEAU;  Surgeon: Roby Lofts, MD;  Location: [facility];  Service: Orthopedics;  Laterality: Left;   ORIF TIBIA PLATEAU Left 09/02/2022   Procedure: REVISION FIXATION OF LEFT TIBIAL PLATEAU FRACTURE;  Surgeon: Roby Lofts, MD;  Location: [facility] OR;  Service: Orthopedics;  Laterality: Left;   PATELLA FRACTURE SURGERY  2009   Right   TONSILLECTOMY     TOTAL ABDOMINAL HYSTERECTOMY     WRIST FRACTURE SURGERY  2009    Patient Active Problem List   Diagnosis Date Noted   Closed fracture of left tibial plateau 09/02/2022   Left tibial fracture 07/29/2022   Dyspnea on exertion 09/15/2020   CVA (cerebral vascular accident) (HCC) 04/20/2019   Pain in left hip 10/05/2016   Pain in right hip 10/05/2016   Trochanteric bursitis, right hip 10/05/2016   Trochanteric bursitis, left hip 10/05/2016   Chronic insomnia 01/28/2011   Seasonal and perennial allergic rhinitis 05/28/2010   Lung nodule 05/28/2010   HYPERLIPIDEMIA 11/23/2009   G E R D 11/23/2009   EUSTACHIAN TUBE DYSFUNCTION 11/18/2009   HYPERTENSION 11/18/2009   Unspecified hypothyroidism 03/14/2007   Obstructive sleep apnea 03/14/2007   FIBROMYALGIA 03/14/2007    PCP: Geoffry Paradise  REFERRING PROVIDER: Roby Lofts, MD  REFERRING DIAG: (L) FIBIAL PLATEAU FRACTURE ORIF (L) FIBIAL PLATEAU FX DOS( 09/02/22)-revision   THERAPY DIAG:  Left leg pain Difficulty in walking Muscle weakness   Rationale for Evaluation and Treatment: Rehabilitation  ONSET DATE: 07/30/22  SUBJECTIVE: PT states her pain has been up since last visit, currently 5/10 in Lt knee and around back.  SUBJECTIVE STATEMENT:  eval:  PT states that she is having difficulty cleaning her house and cooking.  She has been trying not to use the walker as much at home but it makes her knee swell up.  States in the evening the pain is so bad that she is not getting any sleep.  Pt was not using an assistive device prior to the fall.   PERTINENT HISTORY:  Devina Keckler Andaya is an 77 y.o. female with past medical history of CVA, hypertension, hyperlipidemia, GERD, OSA  Patient sustained a fall on Jul 29, 2022, resulting in a left tibial plateau fracture. Patient underwent ORIF of left tibial plateau by Dr. Jena Gauss on 07/30/2022. She was placed in a hinged knee brace postoperatively and instructed to be nonweightbearing on the left lower extremity. Patient has been compliant with these  weightbearing precautions. When patient was evaluated at her postop appointment on 07/31/2022, she was found to have some loss of fixation of the fracture thus she had a second surgery on 09/02/22.  Pt states that she had HH therapy that stopped about a week ago.   PAIN:  Are you having pain? Yes: NPRS scale: 5/10,  worst pain is an 8/10 mainly at night  Pain location: Lt knee down her shin  Pain description: throb  Aggravating factors: WB Relieving factors: rest   PRECAUTIONS: fall      WEIGHT BEARING RESTRICTIONS: WBAT  FALLS:  Has patient fallen in last 6 months? Yes. Number of falls 2  LIVING ENVIRONMENT: Lives with: lives with their family Lives in: House/apartment Stairs: No Has following equipment at home: Environmental consultant - 2 wheeled and stick   OCCUPATION: retired  PLOF: Independent  PATIENT GOALS: Get back to church, get back to doing her grocery shopping and her housework   NEXT MD VISIT: 12/23/22  OBJECTIVE:    PATIENT SURVEYS:  FOTO 28       SENSATION:hyper sensitive    EDEMA: normal for injury    PALPATION: Noted edema and tender to the touch   LOWER EXTREMITY ROM:  Active ROM Right eval Left eval  Hip flexion    Hip extension    Hip abduction    Hip adduction    Hip internal rotation    Hip external rotation    Knee flexion  110  Knee extension  Lacking 12  Ankle dorsiflexion    Ankle plantarflexion    Ankle inversion    Ankle eversion     (Blank rows = not tested)  LOWER EXTREMITY MMT:  MMT Right eval Left eval  Hip flexion 5 4+  Hip extension 3+ 3+  Hip abduction 5 4-  Hip adduction    Hip internal rotation    Hip external rotation    Knee flexion 4+ 4+  Knee extension 5 3  Ankle dorsiflexion 5 5  Ankle plantarflexion    Ankle inversion    Ankle eversion     (Blank rows = not tested)    FUNCTIONAL TESTS:  30 seconds chair stand test:  9 x using arms and walker.  Average for age and sex [age] sit to stand 16.7 seconds  2  minute walk test: with RW 324 feet.     TODAY'S TREATMENT:  DATE: 12/15/22 Seated:  LAQ 10X5"  Supine:  bil SLR 10X2  Bridge 10X2 Sidelying hip abduction 10X2 Prone hamstring curls 10X2  Hip extensions 10X2 Nustep level 4 seat 6 LE only AB mode 5 minutes   12/13/22: Heel raises x 20  Squat x 20  Hip abduction bil 20X each with red theraband  Hip extension bil 20X each with red theraband  Hip back diagonals 20X with red theraband  Tandem stance with head turns 10X  4" forward step up bil 2X10 4" lateral step downs bil 2X10 Sits to stand 10X no UE Terminal extension with 2PL bodycraft  12/09/22: Heel raises x 15  Squat x 15  Hip abduction bil 15X each with red theraband  Hip extension bil 15X each with red theraband  Hip back diagonals x 15 with red theraband  Tandem stance with head turns x 5  4" step up bil x 10  Sits to stand x 5 Terminal extension with red theraband x 15   12/08/22 Standing:  heelraises 20X  Mini squat with intermittent HHA 2X10  March high holds alternating 2X10  Forward lunges no UE assist 10X bil  Lt knee flexion 10X  Hip abduction bil 10X each  Hip extension bil 10X each Sit to stands 10X no UE assist Manual supine with elevation to reduce edema and scar tissue   12/02/22: Gt with cane Heel raise x 10 Squat x 5 Toe raise x 10 Terminal extension x 10 Marching x 5 Tandem stance with head turns 5 reps with Rt in front and then Lt   12/01/22: Evaluation:  HEP how to start a desensitization program    PATIENT EDUCATION:  Education details: HEP, desensitization program  Person educated: Patient Education method: Explanation, Verbal cues, and Handouts Education comprehension: returned demonstration  HOME EXERCISE PROGRAM: Access Code:  URL: [email or web address]  Date: 12/01/2022 - Seated Long Arc  Quad  - 1 x daily - 7 x weekly - 3 sets - 10 reps - Supine Quadricep Sets  - 1 x daily - 7 x weekly - 3 sets - 10 reps - Active Straight Leg Raise with Quad Set  - 1 x daily - 7 x weekly - 3 sets - 10 reps - Supine Bridge  - 1 x daily - 7 x weekly - 3 sets - 10 reps - Supine Heel Slide  - 1 x daily - 7 x weekly - 3 sets - 10 reps  12/02/22 - Heel Raises with Counter Support  - 2 x daily - 7 x weekly - 1 sets - 10 reps - 5 hold - Mini Squat with Counter Support  - 2 x daily - 7 x weekly - 1 sets - 5-10 reps - 5 hold              9/19 Exercises - Standing 3-Way Leg Reach with Resistance at Ankles and Unilateral Counter Support  - 1 x daily - 7 x weekly - 1 sets - 15 reps - 5seconds  hold  ASSESSMENT:  CLINICAL IMPRESSION: Pt reports higher pain today as compared to last session.  Suggested doing mat activities only today as weight bearing also aggravates symptoms. Completed all exercises without pain or issues today.  Encouraged to complete these rather than standing ones when pain is higher.  Finished session today with nustep with overall improvement voiced at end of session today.  Resume standing if symptoms reduced next session. Pt will continue to benefit from skilled PT to address  deficits and maximize her functional ability.    OBJECTIVE IMPAIRMENTS: Abnormal gait, decreased activity tolerance, decreased balance, difficulty walking, decreased ROM, decreased strength, increased edema, increased fascial restrictions, impaired flexibility, and pain.   ACTIVITY LIMITATIONS: carrying, lifting, bending, sitting, squatting, sleeping, stairs, toileting, and locomotion level  PARTICIPATION LIMITATIONS: cleaning, laundry, driving, shopping, community activity, and church  PERSONAL FACTORS: Age, Fitness, and 1 comorbidity: CVA  are also affecting patient's functional outcome.   REHAB POTENTIAL: Good  CLINICAL DECISION MAKING: Stable/uncomplicated  EVALUATION COMPLEXITY:  Moderate   GOALS: Goals reviewed with patient? No  SHORT TERM GOALS: Target date: 12/29/22 PT to be I in HEP in order to decrease her pain to no greater than a 5/10 to allow pt to get 3 hours of sleep  Baseline: Goal status: on-going   2.  Pt ROM to improve to 0-120 to allow pt to be able to walk with a normal gait.  Baseline:  Goal status: on-going  3.  Pt core and LE strength to increase 1/2 grade to allow to be able to come sit to stand from a chair without using her UE.  Baseline:  Goal status: on-going  4.  PT hyper sensitivity of LE to be decreased to allow a sheet to be over her leg without irritation.  Baseline:  Goal status: on-going    LONG TERM GOALS: Target date: 02/02/23  PT to be I in an advanced  HEP in order to decrease her pain to no greater than a 2/10 to allow pt to get 6 hours of sleep Baseline:  Goal status:on-going  2.  Pt core and LE strength to increase 1 grade to allow to be able to go up and down 12 steps with the use of one handrail  Baseline:  Goal status: on-going  3.  Pt to be able to clean her house without difficulty  Baseline:  Goal status: on-going  4.  Pt to be able to cook a full meal without having to rest  Baseline:  Goal status: on-going  5.  PT to be going back to church services  Baseline:  Goal status: on-going   PLAN:  PT FREQUENCY: 2x/week  PT DURATION: 8 weeks  PLANNED INTERVENTIONS: Therapeutic exercises, Therapeutic activity, Neuromuscular re-education, Balance training, Gait training, Patient/Family education, Self Care, Joint mobilization, and Manual therapy  PLAN FOR NEXT SESSION: Continue to  Progress strengthening and balance exercises.  Continue manual if needed at this time pt states no evening pain.    Lurena Nida, PTA/CLT [facility] Ph: [phone]   12/15/2022, 9:47 AM

## 2022-12-21 ENCOUNTER — Encounter (HOSPITAL_COMMUNITY): Payer: Medicare HMO | Admitting: Physical Therapy

## 2022-12-23 ENCOUNTER — Encounter (HOSPITAL_COMMUNITY): Payer: Medicare HMO | Admitting: Physical Therapy

## 2022-12-27 ENCOUNTER — Ambulatory Visit (HOSPITAL_COMMUNITY): Payer: Medicare HMO | Attending: Student | Admitting: Physical Therapy

## 2022-12-27 DIAGNOSIS — M25662 Stiffness of left knee, not elsewhere classified: Secondary | ICD-10-CM | POA: Diagnosis not present

## 2022-12-27 DIAGNOSIS — M79605 Pain in left leg: Secondary | ICD-10-CM | POA: Diagnosis not present

## 2022-12-27 DIAGNOSIS — R262 Difficulty in walking, not elsewhere classified: Secondary | ICD-10-CM | POA: Diagnosis not present

## 2022-12-27 DIAGNOSIS — M6281 Muscle weakness (generalized): Secondary | ICD-10-CM | POA: Diagnosis not present

## 2022-12-27 NOTE — Therapy (Signed)
OUTPATIENT PHYSICAL THERAPY LOWER EXTREMITY Treatment    Patient Name: [name] MRN: [id number] DOB:Jan 15, 1946, 77 y.o., female Today's Date: 12/27/2022       END OF SESSION:      PT End of Session - 12/27/22 1011     Visit Number 7    Number of Visits 16    Date for PT Re-Evaluation 02/02/23    Authorization Type requested 12 visits from [place]    Authorization Time Period 12 visits approved 9/11-10/9    Authorization - Visit Number 7    Authorization - Number of Visits 12    Progress Note Due on Visit 10    PT Start Time 0930    PT Stop Time 1010    PT Time Calculation (min) 40 min    Equipment Utilized During Treatment Gait belt    Activity Tolerance Patient tolerated treatment well    Behavior During Therapy WFL for tasks assessed/performed                Past Medical History:  Diagnosis Date   Anxiety    Arthritis    Depression    DVT (deep venous thrombosis) (HCC) 08/02/2022   LLE 08/02/22   Essential hypertension    Eustachian tube dysfunction    Fibromyalgia    GERD (gastroesophageal reflux disease)    Hyperlipidemia    Hypothyroid    Mild carotid artery disease (HCC)    OSA (obstructive sleep apnea)    Osteopenia    Pneumonia    Stroke (HCC)    Type 2 diabetes mellitus (HCC)    Past Surgical History:  Procedure Laterality Date   ANKLE SURGERY     Anteriorvesicourethropexy     BUNIONECTOMY     COLONOSCOPY     NASAL SINUS SURGERY     ORIF TIBIA PLATEAU Left 07/30/2022   Procedure: OPEN REDUCTION INTERNAL FIXATION (ORIF) TIBIAL PLATEAU;  Surgeon: Roby Lofts, MD;  Location: [facility] OR;  Service: Orthopedics;  Laterality: Left;   ORIF TIBIA PLATEAU Left 09/02/2022   Procedure: REVISION FIXATION OF LEFT TIBIAL PLATEAU FRACTURE;  Surgeon: Roby Lofts, MD;  Location: [facility] OR;  Service: Orthopedics;  Laterality: Left;   PATELLA FRACTURE SURGERY  2009   Right   TONSILLECTOMY     TOTAL ABDOMINAL HYSTERECTOMY     WRIST FRACTURE SURGERY   2009   Patient Active Problem List   Diagnosis Date Noted   Closed fracture of left tibial plateau 09/02/2022   Left tibial fracture 07/29/2022   Dyspnea on exertion 09/15/2020   CVA (cerebral vascular accident) (HCC) 04/20/2019   Pain in left hip 10/05/2016   Pain in right hip 10/05/2016   Trochanteric bursitis, right hip 10/05/2016   Trochanteric bursitis, left hip 10/05/2016   Chronic insomnia 01/28/2011   Seasonal and perennial allergic rhinitis 05/28/2010   Lung nodule 05/28/2010   HYPERLIPIDEMIA 11/23/2009   G E R D 11/23/2009   EUSTACHIAN TUBE DYSFUNCTION 11/18/2009   HYPERTENSION 11/18/2009   Unspecified hypothyroidism 03/14/2007   Obstructive sleep apnea 03/14/2007   FIBROMYALGIA 03/14/2007    PCP: [name]  REFERRING PROVIDER: Roby Lofts, MD  REFERRING DIAG: (L) FIBIAL PLATEAU FRACTURE ORIF (L) FIBIAL PLATEAU FX DOS( 09/02/22)-revision   THERAPY DIAG:  Left leg pain Difficulty in walking Muscle weakness   Rationale for Evaluation and Treatment: Rehabilitation  ONSET DATE: 07/30/22  SUBJECTIVE: Pt states she was not able to come last week due to a stomach virus.  Pt  states she still can not stand sheets or slacks to be on her LE.  PT states that she has good days, but for every good day she has two bad.  Pt states that on the whole though.  Pain is worst when she first gets up.   SUBJECTIVE STATEMENT:  eval:  PT states that she is having difficulty cleaning her house and cooking.  She has been trying not to use the walker as much at home but it makes her knee swell up.  States in the evening the pain is so bad that she is not getting any sleep.  Pt was not using an assistive device prior to the fall.   PERTINENT HISTORY:  [name] is an 77 y.o. female with past medical history of CVA, hypertension, hyperlipidemia, GERD, OSA  Patient sustained a fall on Jul 29, 2022, resulting in a left tibial plateau fracture. Patient underwent ORIF of left  tibial plateau by Dr. Jena Gauss on 07/30/2022. She was placed in a hinged knee brace postoperatively and instructed to be nonweightbearing on the left lower extremity. Patient has been compliant with these weightbearing precautions. When patient was evaluated at her postop appointment on 07/31/2022, she was found to have some loss of fixation of the fracture thus she had a second surgery on 09/02/22.  Pt states that she had HH therapy that stopped about a week ago.   PAIN:  Are you having pain? Yes: NPRS scale: 0/10,  worst pain is an 8/10 mainly at night  Pain location: Lt knee down her shin  Pain description: throb  Aggravating factors: WB Relieving factors: rest   PRECAUTIONS: fall      WEIGHT BEARING RESTRICTIONS: WBAT  FALLS:  Has patient fallen in last 6 months? Yes. Number of falls 2  LIVING ENVIRONMENT: Lives with: lives with their family Lives in: House/apartment Stairs: No Has following equipment at home: Environmental consultant - 2 wheeled and stick   OCCUPATION: retired  PLOF: Independent  PATIENT GOALS: Get back to church, get back to doing her grocery shopping and her housework   NEXT MD VISIT: 12/23/22  OBJECTIVE:    PATIENT SURVEYS:  FOTO 28       SENSATION:hyper sensitive    EDEMA: normal for injury    PALPATION: Noted edema and tender to the touch   LOWER EXTREMITY ROM:  Active ROM Right eval Left eval  Hip flexion    Hip extension    Hip abduction    Hip adduction    Hip internal rotation    Hip external rotation    Knee flexion  110  Knee extension  Lacking 12  Ankle dorsiflexion    Ankle plantarflexion    Ankle inversion    Ankle eversion     (Blank rows = not tested)  LOWER EXTREMITY MMT:  MMT Right eval Left eval  Hip flexion 5 4+  Hip extension 3+ 3+  Hip abduction 5 4-  Hip adduction    Hip internal rotation    Hip external rotation    Knee flexion 4+ 4+  Knee extension 5 3  Ankle dorsiflexion 5 5  Ankle plantarflexion    Ankle  inversion    Ankle eversion     (Blank rows = not tested)    FUNCTIONAL TESTS:  30 seconds chair stand test:  9 x using arms and walker.  Average for age and sex 68 5 sit to stand 16.7 seconds  2 minute walk test: with RW 324  feet.     TODAY'S TREATMENT:                                                                                                                              DATE: 12/27/22 Seated  Sit to stand x 10 Stand : heel raises x 10 Toe raise x 10  Functional squat x 10  Tandem stance x  with head turns 10  Step up 4" x 10 B  Lunging B  onto 4" step x 5 Quad set x 10" Manual gently to assist with desensitization   12/15/22 Seated:  LAQ 10X5"  Supine:  bil SLR 10X2  Bridge 10X2 Sidelying hip abduction 10X2 Prone hamstring curls 10X2  Hip extensions 10X2 Nustep level 4 seat 6 LE only AB mode 5 minutes   12/13/22: Heel raises x 20  Squat x 20  Hip abduction bil 20X each with red theraband  Hip extension bil 20X each with red theraband  Hip back diagonals 20X with red theraband  Tandem stance with head turns 10X  4" forward step up bil 2X10 4" lateral step downs bil 2X10 Sits to stand 10X no UE Terminal extension with 2PL bodycraft  12/09/22: Heel raises x 15  Squat x 15  Hip abduction bil 15X each with red theraband  Hip extension bil 15X each with red theraband  Hip back diagonals x 15 with red theraband  Tandem stance with head turns x 5  4" step up bil x 10  Sits to stand x 5 Terminal extension with red theraband x 15   12/08/22 Standing:  heelraises 20X  Mini squat with intermittent HHA 2X10  March high holds alternating 2X10  Forward lunges no UE assist 10X bil  Lt knee flexion 10X  Hip abduction bil 10X each  Hip extension bil 10X each Sit to stands 10X no UE assist Manual supine with elevation to reduce edema and scar tissue   12/02/22: Gt with cane Heel raise x 10 Squat x 5 Toe raise x 10 Terminal extension x 10 Marching x 5 Tandem  stance with head turns 5 reps with Rt in front and then Lt   12/01/22: Evaluation:  HEP how to start a desensitization program    PATIENT EDUCATION:  Education details: HEP, desensitization program  Person educated: Patient Education method: Explanation, Verbal cues, and Handouts Education comprehension: returned demonstration  HOME EXERCISE PROGRAM: Access Code:  URL: [email or web address]  Date: 12/01/2022 - Seated Long Arc Quad  - 1 x daily - 7 x weekly - 3 sets - 10 reps - Supine Quadricep Sets  - 1 x daily - 7 x weekly - 3 sets - 10 reps - Active Straight Leg Raise with Quad Set  - 1 x daily - 7 x weekly - 3 sets - 10 reps - Supine Bridge  - 1 x daily - 7 x weekly - 3 sets - 10 reps - Supine Heel Slide  -  1 x daily - 7 x weekly - 3 sets - 10 reps  12/02/22 - Heel Raises with Counter Support  - 2 x daily - 7 x weekly - 1 sets - 10 reps - 5 hold - Mini Squat with Counter Support  - 2 x daily - 7 x weekly - 1 sets - 5-10 reps - 5 hold              9/19 Exercises - Standing 3-Way Leg Reach with Resistance at Ankles and Unilateral Counter Support  - 1 x daily - 7 x weekly - 1 sets - 15 reps - 5seconds  hold  ASSESSMENT:  CLINICAL IMPRESSION:Resumed standing activity with pt with good tolerance.  Added manual as pt is still having sensitization difficulty. Pt continues to need multiple short rest breaks.  Pt will continue to benefit from skilled PT to address deficits and maximize her functional ability.    OBJECTIVE IMPAIRMENTS: Abnormal gait, decreased activity tolerance, decreased balance, difficulty walking, decreased ROM, decreased strength, increased edema, increased fascial restrictions, impaired flexibility, and pain.   ACTIVITY LIMITATIONS: carrying, lifting, bending, sitting, squatting, sleeping, stairs, toileting, and locomotion level  PARTICIPATION LIMITATIONS: cleaning, laundry, driving, shopping, community activity, and church  PERSONAL FACTORS: Age,  Fitness, and 1 comorbidity: CVA  are also affecting patient's functional outcome.   REHAB POTENTIAL: Good  CLINICAL DECISION MAKING: Stable/uncomplicated  EVALUATION COMPLEXITY: Moderate   GOALS: Goals reviewed with patient? No  SHORT TERM GOALS: Target date: 12/29/22 PT to be I in HEP in order to decrease her pain to no greater than a 5/10 to allow pt to get 3 hours of sleep  Baseline: Goal status: met   2.  Pt ROM to improve to 0-120 to allow pt to be able to walk with a normal gait.  Baseline:  Goal status: on-going  3.  Pt core and LE strength to increase 1/2 grade to allow to be able to come sit to stand from a chair without using her UE.  Baseline:  Goal status: on-going  4.  PT hyper sensitivity of LE to be decreased to allow a sheet to be over her leg without irritation.  Baseline:  Goal status: on-going    LONG TERM GOALS: Target date: 02/02/23  PT to be I in an advanced  HEP in order to decrease her pain to no greater than a 2/10 to allow pt to get 6 hours of sleep Baseline:  Goal status:on-going  2.  Pt core and LE strength to increase 1 grade to allow to be able to go up and down 12 steps with the use of one handrail  Baseline:  Goal status: on-going  3.  Pt to be able to clean her house without difficulty  Baseline:  Goal status: on-going  4.  Pt to be able to cook a full meal without having to rest  Baseline:  Goal status: on-going  5.  PT to be going back to church services  Baseline:  Goal status: on-going   PLAN:  PT FREQUENCY: 2x/week  PT DURATION: 8 weeks  PLANNED INTERVENTIONS: Therapeutic exercises, Therapeutic activity, Neuromuscular re-education, Balance training, Gait training, Patient/Family education, Self Care, Joint mobilization, and Manual therapy  PLAN FOR NEXT SESSION: Continue to  Progress strengthening and balance exercises.  Continue manual if needed at this time pt states no evening pain.    Virgina Organ, PT  CLT [phone] [facility] Ph: [phone]   12/27/2022, 10:14 AM

## 2022-12-28 DIAGNOSIS — S82142D Displaced bicondylar fracture of left tibia, subsequent encounter for closed fracture with routine healing: Secondary | ICD-10-CM | POA: Diagnosis not present

## 2022-12-30 ENCOUNTER — Ambulatory Visit (HOSPITAL_COMMUNITY): Payer: Medicare HMO | Admitting: Physical Therapy

## 2022-12-30 ENCOUNTER — Encounter (HOSPITAL_COMMUNITY): Payer: Self-pay | Admitting: Physical Therapy

## 2022-12-30 DIAGNOSIS — M79605 Pain in left leg: Secondary | ICD-10-CM

## 2022-12-30 DIAGNOSIS — M25662 Stiffness of left knee, not elsewhere classified: Secondary | ICD-10-CM | POA: Diagnosis not present

## 2022-12-30 DIAGNOSIS — M6281 Muscle weakness (generalized): Secondary | ICD-10-CM | POA: Diagnosis not present

## 2022-12-30 DIAGNOSIS — R262 Difficulty in walking, not elsewhere classified: Secondary | ICD-10-CM | POA: Diagnosis not present

## 2022-12-30 NOTE — Therapy (Signed)
OUTPATIENT PHYSICAL THERAPY LOWER EXTREMITY Treatment /discharg   Patient Name: [name] MRN: [id number] DOB:Feb 12, 1946, 77 y.o., female Today's Date: 12/30/2022 PHYSICAL THERAPY DISCHARGE SUMMARY  Visits from Start of Care: 8  Current functional level related to goals / functional outcomes: See below   Remaining deficits: See below   Education / Equipment: See below   Patient agrees to discharge. Patient goals were met. Patient is being discharged due to being pleased with the current functional level.       END OF SESSION:      PT End of Session - 12/30/22 1013     Visit Number 8    Number of Visits 16    Date for PT Re-Evaluation 02/02/23    Authorization Type requested one visit form [employer] to cover 10/10 visit    Authorization Time Period --    Authorization - Number of Visits 12    Progress Note Due on Visit 10    PT Start Time 0930    PT Stop Time 1010    PT Time Calculation (min) 40 min    Equipment Utilized During Treatment Gait belt    Activity Tolerance Patient tolerated treatment well    Behavior During Therapy WFL for tasks assessed/performed                 Past Medical History:  Diagnosis Date   Anxiety    Arthritis    Depression    DVT (deep venous thrombosis) (HCC) 08/02/2022   LLE 08/02/22   Essential hypertension    Eustachian tube dysfunction    Fibromyalgia    GERD (gastroesophageal reflux disease)    Hyperlipidemia    Hypothyroid    Mild carotid artery disease (HCC)    OSA (obstructive sleep apnea)    Osteopenia    Pneumonia    Stroke (HCC)    Type 2 diabetes mellitus (HCC)    Past Surgical History:  Procedure Laterality Date   ANKLE SURGERY     Anteriorvesicourethropexy     BUNIONECTOMY     COLONOSCOPY     NASAL SINUS SURGERY     ORIF TIBIA PLATEAU Left 07/30/2022   Procedure: OPEN REDUCTION INTERNAL FIXATION (ORIF) TIBIAL PLATEAU;  Surgeon: Roby Lofts, MD;  Location: [facility] OR;  Service: Orthopedics;   Laterality: Left;   ORIF TIBIA PLATEAU Left 09/02/2022   Procedure: REVISION FIXATION OF LEFT TIBIAL PLATEAU FRACTURE;  Surgeon: Roby Lofts, MD;  Location: [facility] OR;  Service: Orthopedics;  Laterality: Left;   PATELLA FRACTURE SURGERY  2009   Right   TONSILLECTOMY     TOTAL ABDOMINAL HYSTERECTOMY     WRIST FRACTURE SURGERY  2009   Patient Active Problem List   Diagnosis Date Noted   Closed fracture of left tibial plateau 09/02/2022   Left tibial fracture 07/29/2022   Dyspnea on exertion 09/15/2020   CVA (cerebral vascular accident) (HCC) 04/20/2019   Pain in left hip 10/05/2016   Pain in right hip 10/05/2016   Trochanteric bursitis, right hip 10/05/2016   Trochanteric bursitis, left hip 10/05/2016   Chronic insomnia 01/28/2011   Seasonal and perennial allergic rhinitis 05/28/2010   Lung nodule 05/28/2010   HYPERLIPIDEMIA 11/23/2009   G E R D 11/23/2009   EUSTACHIAN TUBE DYSFUNCTION 11/18/2009   HYPERTENSION 11/18/2009   Unspecified hypothyroidism 03/14/2007   Obstructive sleep apnea 03/14/2007   FIBROMYALGIA 03/14/2007    PCP: Geoffry Paradise  REFERRING PROVIDER: Roby Lofts, MD  REFERRING DIAG: (L) FIBIAL  PLATEAU FRACTURE ORIF (L) FIBIAL PLATEAU FX DOS( 09/02/22)-revision   THERAPY DIAG:  Left leg pain Difficulty in walking Muscle weakness   Rationale for Evaluation and Treatment: Rehabilitation  ONSET DATE: 07/30/22  SUBJECTIVE: SUBJECTIVE STATEMENT:   PT states that her MD was pleased.  X-ray's look good.  She states that her leg was really swollen yesterday.  She is cooking and cleaning but she needs to take breaks.   eval:  PT states that she is having difficulty cleaning her house and cooking.  She has been trying not to use the walker as much at home but it makes her knee swell up.  States in the evening the pain is so bad that she is not getting any sleep.  Pt was not using an assistive device prior to the fall.   PERTINENT HISTORY:  [name] is  an 77 y.o. female with past medical history of CVA, hypertension, hyperlipidemia, GERD, OSA  Patient sustained a fall on Jul 29, 2022, resulting in a left tibial plateau fracture. Patient underwent ORIF of left tibial plateau by Dr. Jena Gauss on 07/30/2022. She was placed in a hinged knee brace postoperatively and instructed to be nonweightbearing on the left lower extremity. Patient has been compliant with these weightbearing precautions. When patient was evaluated at her postop appointment on 07/31/2022, she was found to have some loss of fixation of the fracture thus she had a second surgery on 09/02/22.  Pt states that she had HH therapy that stopped about a week ago.   PAIN:  Are you having pain? Yes: NPRS scale: 0/10,  worst pain is an 8/10 mainly at night  Pain location: Lt knee down her shin  Pain description: throb  Aggravating factors: WB Relieving factors: rest   PRECAUTIONS: fall      WEIGHT BEARING RESTRICTIONS: WBAT  FALLS:  Has patient fallen in last 6 months? Yes. Number of falls 2  LIVING ENVIRONMENT: Lives with: lives with their family Lives in: House/apartment Stairs: No Has following equipment at home: Environmental consultant - 2 wheeled and stick   OCCUPATION: retired  PLOF: Independent  PATIENT GOALS: Get back to church, get back to doing her grocery shopping and her housework   NEXT MD VISIT: 12/23/22  OBJECTIVE:    PATIENT SURVEYS: 12/30/22: 62 FOTO 28       SENSATION:hyper sensitive    EDEMA: normal for injury    PALPATION: Noted edema and tender to the touch   LOWER EXTREMITY ROM:  Active ROM Right eval Left eval 10/10  Hip flexion     Hip extension     Hip abduction     Hip adduction     Hip internal rotation     Hip external rotation     Knee flexion  110 120  Knee extension  Lacking 12 Lacking 3   Ankle dorsiflexion     Ankle plantarflexion     Ankle inversion     Ankle eversion      (Blank rows = not tested)  LOWER EXTREMITY MMT:  MMT  Right eval 10/10 Left eval 10/10  Hip flexion 5  4+ 5  Hip extension 3+ 5 3+ 5  Hip abduction 5  4- 5  Hip adduction      Hip internal rotation      Hip external rotation      Knee flexion 4+  4+   Knee extension 5 5 3 5   Ankle dorsiflexion 5 5 5 5   Ankle plantarflexion  Ankle inversion      Ankle eversion       (Blank rows = not tested)    FUNCTIONAL TESTS:  30 seconds chair stand test:  9 x using arms and walker.  Average for age and sex 72:   12/30/22 : 14 in 30 seconds  5 sit to stand 16.7 seconds  2 minute walk test: with RW 324 feet.  2 minute walk test 12/30/22 442 ft no assistive device    TODAY'S TREATMENT:                                                                                                                              DATE: 12/27/22 Seated  Sit to stand x 10 Stand : heel raises x 10 Toe raise x 10  Functional squat x 10  Tandem stance x  with head turns 10  Step up 4" x 10 B  Lunging B  onto 4" step x 5 Quad set x 10" Manual gently to assist with desensitization   12/15/22 Seated:  LAQ 10X5"  Supine:  bil SLR 10X2  Bridge 10X2 Sidelying hip abduction 10X2 Prone hamstring curls 10X2  Hip extensions 10X2 Nustep level 4 seat 6 LE only AB mode 5 minutes   12/13/22: Heel raises x 20  Squat x 20  Hip abduction bil 20X each with red theraband  Hip extension bil 20X each with red theraband  Hip back diagonals 20X with red theraband  Tandem stance with head turns 10X  4" forward step up bil 2X10 4" lateral step downs bil 2X10 Sits to stand 10X no UE Terminal extension with 2PL bodycraft  12/09/22: Heel raises x 15  Squat x 15  Hip abduction bil 15X each with red theraband  Hip extension bil 15X each with red theraband  Hip back diagonals x 15 with red theraband  Tandem stance with head turns x 5  4" step up bil x 10  Sits to stand x 5 Terminal extension with red theraband x 15   12/08/22 Standing:  heelraises 20X  Mini squat with  intermittent HHA 2X10  March high holds alternating 2X10  Forward lunges no UE assist 10X bil  Lt knee flexion 10X  Hip abduction bil 10X each  Hip extension bil 10X each Sit to stands 10X no UE assist Manual supine with elevation to reduce edema and scar tissue   12/02/22: Gt with cane Heel raise x 10 Squat x 5 Toe raise x 10 Terminal extension x 10 Marching x 5 Tandem stance with head turns 5 reps with Rt in front and then Lt   12/01/22: Evaluation:  HEP how to start a desensitization program    PATIENT EDUCATION: 10/10:  Therapist explained how to advance desensitization program as pt is still having issues with this, encouraged quad sets to improve extension, encouraged sit to stand and walking as a daily HEP to improve pt overall health.  Education details:  HEP, desensitization program  Person educated: Patient Education method: Explanation, Verbal cues, and Handouts Education comprehension: returned demonstration  HOME EXERCISE PROGRAM: Access Code:  URL: [email or web address]  Date: 12/01/2022 - Seated Long Arc Quad  - 1 x daily - 7 x weekly - 3 sets - 10 reps - Supine Quadricep Sets  - 1 x daily - 7 x weekly - 3 sets - 10 reps - Active Straight Leg Raise with Quad Set  - 1 x daily - 7 x weekly - 3 sets - 10 reps - Supine Bridge  - 1 x daily - 7 x weekly - 3 sets - 10 reps - Supine Heel Slide  - 1 x daily - 7 x weekly - 3 sets - 10 reps  12/02/22 - Heel Raises with Counter Support  - 2 x daily - 7 x weekly - 1 sets - 10 reps - 5 hold - Mini Squat with Counter Support  - 2 x daily - 7 x weekly - 1 sets - 5-10 reps - 5 hold              9/19 Exercises - Standing 3-Way Leg Reach with Resistance at Ankles and Unilateral Counter Support  - 1 x daily - 7 x weekly - 1 sets - 15 reps - 5seconds  hold  ASSESSMENT:  CLINICAL IMPRESSION:PT was reassessed with meeting 3/4 STG and 4/5 LTG.  Pt has gone back to completing all of her normal activity she just has to  take breaks due to lack of activity tolerance, however, this is also improving.  Pt agreeable to discharge at this time.    OBJECTIVE IMPAIRMENTS: Abnormal gait, decreased activity tolerance, decreased balance, difficulty walking, decreased ROM, decreased strength, increased edema, increased fascial restrictions, impaired flexibility, and pain.   ACTIVITY LIMITATIONS: carrying, lifting, bending, sitting, squatting, sleeping, stairs, toileting, and locomotion level  PARTICIPATION LIMITATIONS: cleaning, laundry, driving, shopping, community activity, and church  PERSONAL FACTORS: Age, Fitness, and 1 comorbidity: CVA  are also affecting patient's functional outcome.   REHAB POTENTIAL: Good  CLINICAL DECISION MAKING: Stable/uncomplicated  EVALUATION COMPLEXITY: Moderate   GOALS: Goals reviewed with patient? No  SHORT TERM GOALS: Target date: 12/29/22 PT to be I in HEP in order to decrease her pain to no greater than a 5/10 to allow pt to get 3 hours of sleep  Baseline: Goal status: met   2.  Pt ROM to improve to 0-120 to allow pt to be able to walk with a normal gait.  Baseline:  Goal status: on-going; ROM 3-120  3.  Pt core and LE strength to increase 1/2 grade to allow to be able to come sit to stand from a chair without using her UE.  Baseline:  Goal status: met  4.  PT hyper sensitivity of LE to be decreased to allow a sheet to be over her leg without irritation.  Baseline:  Goal status: met    LONG TERM GOALS: Target date: 02/02/23  PT to be I in an advanced  HEP in order to decrease her pain to no greater than a 2/10 to allow pt to get 6 hours of sleep Baseline:  Goal status: on-going however pt states that this is not due to leg pain she is not sure why.    2.  Pt core and LE strength to increase 1 grade to allow to be able to go up and down 12 steps with the use of one handrail  Baseline:  Goal status: met  3.  Pt to be able to clean her house without difficulty   Baseline:  Goal status: met  4.  Pt to be able to cook a full meal without having to rest  Baseline:  Goal status: met  5.  PT to be going back to church services  Baseline:  Goal status: met    PLAN:  PT FREQUENCY: 2x/week  PT DURATION: 8 weeks  PLANNED INTERVENTIONS: Therapeutic exercises, Therapeutic activity, Neuromuscular re-education, Balance training, Gait training, Patient/Family education, Self Care, Joint mobilization, and Manual therapy  PLAN FOR NEXT SESSION: Discharge.     Virgina Organ, PT CLT [phone] [facility] Ph: [phone]   12/30/2022, 10:13 AM

## 2022-12-31 ENCOUNTER — Ambulatory Visit
Admission: EM | Admit: 2022-12-31 | Discharge: 2022-12-31 | Disposition: A | Discharge status: Home / Self Care | Payer: Medicare HMO | Attending: Nurse Practitioner | Admitting: Nurse Practitioner

## 2022-12-31 DIAGNOSIS — J0140 Acute pansinusitis, unspecified: Secondary | ICD-10-CM | POA: Diagnosis not present

## 2022-12-31 DIAGNOSIS — H6593 Unspecified nonsuppurative otitis media, bilateral: Secondary | ICD-10-CM

## 2022-12-31 MED ORDER — FLUCONAZOLE 150 MG PO TABS: ORAL_TABLET | ORAL | 0 refills | Status: DC

## 2022-12-31 MED ORDER — AMOXICILLIN-POT CLAVULANATE 875-125 MG PO TABS: 1.0000 | ORAL_TABLET | Freq: Two times a day (BID) | ORAL | 0 refills | Status: DC

## 2022-12-31 MED ORDER — CETIRIZINE HCL 10 MG PO TABS: 10.0000 mg | ORAL_TABLET | Freq: Every day | ORAL | 0 refills | Status: AC

## 2022-12-31 NOTE — ED Triage Notes (Signed)
Pt reports both ears feel full x 4 days   Used debrox but no relief

## 2022-12-31 NOTE — Discharge Instructions (Addendum)
Take medication as directed. Increase fluids and get plenty of rest. May take over-the-counter Tylenol as needed for pain, fever, or general discomfort. Recommend normal saline nasal spray to help with nasal congestion throughout the day. If you develop a cough, it will be helpful for her to use a humidifier in your bedroom at nighttime during sleep. If symptoms fail to improve with this treatment, please follow-up with your primary care physician for further evaluation. Follow-up as needed.

## 2022-12-31 NOTE — ED Provider Notes (Signed)
[facility]    CSN: [id number] Arrival date & time: 12/31/22  1410      History   Chief Complaint No chief complaint on file.   HPI [name] is a 77 y.o. female.   The history is provided by the patient.   Patient presents with a 2-week history of nasal congestion, runny nose, and sinus pressure.  States headache is worse at night, also endorses intermittent dizziness.  Patient also reports over the past several days, she has had decreased and muffled hearing.  Patient denies fever, chills, ear drainage, cough, chest pain, abdominal pain, nausea, vomiting, diarrhea, or rash.  Patient reports that she did try using Debrox earwax softener, but symptoms appear to have worsened.  She has not taken any medication for her symptoms.  Past Medical History:  Diagnosis Date   Anxiety    Arthritis    Depression    DVT (deep venous thrombosis) (HCC) 08/02/2022   LLE 08/02/22   Essential hypertension    Eustachian tube dysfunction    Fibromyalgia    GERD (gastroesophageal reflux disease)    Hyperlipidemia    Hypothyroid    Mild carotid artery disease (HCC)    OSA (obstructive sleep apnea)    Osteopenia    Pneumonia    Stroke (HCC)    Type 2 diabetes mellitus [facility])     Patient Active Problem List   Diagnosis Date Noted   Closed fracture of left tibial plateau 09/02/2022   Left tibial fracture 07/29/2022   Dyspnea on exertion 09/15/2020   CVA (cerebral vascular accident) (HCC) 04/20/2019   Pain in left hip 10/05/2016   Pain in right hip 10/05/2016   Trochanteric bursitis, right hip 10/05/2016   Trochanteric bursitis, left hip 10/05/2016   Chronic insomnia 01/28/2011   Seasonal and perennial allergic rhinitis 05/28/2010   Lung nodule 05/28/2010   HYPERLIPIDEMIA 11/23/2009   G E R D 11/23/2009   EUSTACHIAN TUBE DYSFUNCTION 11/18/2009   HYPERTENSION 11/18/2009   Unspecified hypothyroidism 03/14/2007   Obstructive sleep apnea 03/14/2007   FIBROMYALGIA  03/14/2007    Past Surgical History:  Procedure Laterality Date   ANKLE SURGERY     Anteriorvesicourethropexy     BUNIONECTOMY     COLONOSCOPY     NASAL SINUS SURGERY     ORIF TIBIA PLATEAU Left 07/30/2022   Procedure: OPEN REDUCTION INTERNAL FIXATION (ORIF) TIBIAL PLATEAU;  Surgeon: Roby Lofts, MD;  Location: [facility] OR;  Service: Orthopedics;  Laterality: Left;   ORIF TIBIA PLATEAU Left 09/02/2022   Procedure: REVISION FIXATION OF LEFT TIBIAL PLATEAU FRACTURE;  Surgeon: Roby Lofts, MD;  Location: [facility];  Service: Orthopedics;  Laterality: Left;   PATELLA FRACTURE SURGERY  2009   Right   TONSILLECTOMY     TOTAL ABDOMINAL HYSTERECTOMY     WRIST FRACTURE SURGERY  2009    OB History   No obstetric history on file.      Home Medications    Prior to Admission medications   Medication Sig Start Date End Date Taking? Authorizing Provider  amoxicillin-clavulanate (AUGMENTIN) 875-125 MG tablet Take 1 tablet by mouth every 12 (twelve) hours. 12/31/22  Yes Rochella Benner-Warren, Sadie Haber, NP  cetirizine (ZYRTEC) 10 MG tablet Take 1 tablet (10 mg total) by mouth daily. 12/31/22  Yes Izeyah Deike-Warren, Sadie Haber, NP  fluconazole (DIFLUCAN) 150 MG tablet Take one tablet by mouth, repeat in 72 hours if symptoms persist. 12/31/22  Yes Romeka Scifres-Warren, Sadie Haber, NP  amLODipine (NORVASC)  5 MG tablet Take 5 mg by mouth daily. 08/30/20   [provider]  apixaban (ELIQUIS) 5 MG TABS tablet Take 1 tablet (5 mg total) by mouth 2 (two) times daily. Start this prescription on 08/09/22 when the starter pack is completed 08/09/22   Rai, Delene Ruffini, MD  Biotin w/ Vitamins C & E (HAIR SKIN & NAILS GUMMIES PO) Take 2 each by mouth daily.    [provider]  Cyanocobalamin (B-12) 1000 MCG SUBL Place 1,000 mcg under the tongue daily.    [provider]  docusate sodium (COLACE) 100 MG capsule Take 1 capsule (100 mg total) by mouth 2 (two) times daily. Patient taking differently: Take 100 mg  by mouth daily as needed for moderate constipation. 08/04/22   Rai, Delene Ruffini, MD  DULoxetine (CYMBALTA) 60 MG capsule Take 60 mg by mouth daily.    [provider]  gabapentin (NEURONTIN) 100 MG capsule Take 100 mg by mouth 3 (three) times daily. 08/25/22   [provider]  hydrOXYzine (ATARAX) 25 MG tablet Take 25 mg by mouth every 6 (six) hours as needed for anxiety. 08/25/22   [provider]  insulin lispro (HUMALOG) 100 UNIT/ML injection Inject 0-10 Units into the skin 3 (three) times daily before meals. Sliding Scale    [provider]  irbesartan (AVAPRO) 300 MG tablet Take 300 mg by mouth daily.    [provider]  methocarbamol (ROBAXIN) 500 MG tablet Take 1 tablet (500 mg total) by mouth every 6 (six) hours as needed for muscle spasms. 09/02/22   West Bali, PA-C  metoprolol succinate (TOPROL-XL) 50 MG 24 hr tablet Take 50 mg by mouth daily. 09/30/20   [provider]  Multiple Vitamins-Minerals (MULTIVITAMIN WITH MINERALS) tablet Take 1 tablet by mouth daily.    [provider]  omeprazole (PRILOSEC) 20 MG capsule Take 20 mg by mouth daily. 12/28/19   [provider]  polyethylene glycol (MIRALAX / GLYCOLAX) 17 g packet Take 17 g by mouth daily as needed for mild constipation. 08/04/22   Rai, Delene Ruffini, MD  rosuvastatin (CRESTOR) 20 MG tablet Take 20 mg by mouth at bedtime. 02/08/19   [provider]  tirzepatide Greggory Keen) 5 MG/0.5ML Pen Inject 5 mg into the skin once a week.    [provider]  TOUJEO SOLOSTAR 300 UNIT/ML Solostar Pen Inject 30 Units into the skin daily. 09/23/20   [provider]  traMADol (ULTRAM) 50 MG tablet Take 50 mg by mouth every 6 (six) hours as needed.    [provider]  traZODone (DESYREL) 50 MG tablet Take 25 mg by mouth at bedtime. 08/19/22   [provider]  triamterene-hydrochlorothiazide (MAXZIDE-25) 37.5-25 MG tablet Take 1 tablet by mouth  daily. 08/22/22   [provider]    Family History Family History  Problem Relation Age of Onset   Heart attack Mother    Colon cancer Father     Social History Social History   Tobacco Use   Smoking status: Never   Smokeless tobacco: Never  Vaping Use   Vaping status: Never Used  Substance Use Topics   Alcohol use: No   Drug use: No     Allergies   Oxycodone   Review of Systems Review of Systems Per HPI  Physical Exam Triage Vital Signs ED Triage Vitals  Encounter Vitals Group     BP 12/31/22 1708 119/81     Systolic BP Percentile --  Diastolic BP Percentile --      Pulse Rate 12/31/22 1708 92     Resp 12/31/22 1708 18     Temp 12/31/22 1708 98.1 F (36.7 C)     Temp Source 12/31/22 1708 Oral     SpO2 12/31/22 1708 97 %     Weight --      Height --      Head Circumference --      Peak Flow --      Pain Score 12/31/22 1709 0     Pain Loc --      Pain Education --      Exclude from Growth Chart --    No data found.  Updated Vital Signs BP 119/81 (BP Location: Right Arm)   Pulse 92   Temp 98.1 F (36.7 C) (Oral)   Resp 18   SpO2 97%   Visual Acuity Right Eye Distance:   Left Eye Distance:   Bilateral Distance:    Right Eye Near:   Left Eye Near:    Bilateral Near:     Physical Exam Vitals and nursing note reviewed.  Constitutional:      General: She is not in acute distress.    Appearance: Normal appearance.  HENT:     Head: Normocephalic.     Right Ear: Ear canal and external ear normal. Decreased hearing noted. A middle ear effusion is present.     Left Ear: Ear canal and external ear normal. Decreased hearing noted. A middle ear effusion is present.     Nose: Congestion present.     Right Turbinates: Enlarged and swollen.     Left Turbinates: Enlarged and swollen.     Right Sinus: Maxillary sinus tenderness and frontal sinus tenderness present.     Left Sinus: No maxillary sinus tenderness or frontal sinus tenderness.      Mouth/Throat:     Lips: Pink.     Mouth: Mucous membranes are moist.     Pharynx: Uvula midline. Posterior oropharyngeal erythema and postnasal drip present. No pharyngeal swelling, oropharyngeal exudate or uvula swelling.  Eyes:     Extraocular Movements: Extraocular movements intact.     Conjunctiva/sclera: Conjunctivae normal.     Pupils: Pupils are equal, round, and reactive to light.  Cardiovascular:     Rate and Rhythm: Normal rate and regular rhythm.     Pulses: Normal pulses.     Heart sounds: Normal heart sounds.  Pulmonary:     Effort: Pulmonary effort is normal.     Breath sounds: Normal breath sounds.  Abdominal:     General: Bowel sounds are normal.     Palpations: Abdomen is soft.     Tenderness: There is no abdominal tenderness.  Musculoskeletal:     Cervical back: Normal range of motion.  Lymphadenopathy:     Cervical: No cervical adenopathy.  Skin:    General: Skin is warm and dry.  Neurological:     General: No focal deficit present.     Mental Status: She is alert and oriented to person, place, and time.  Psychiatric:        Mood and Affect: Mood normal.        Behavior: Behavior normal.      UC Treatments / Results  Labs (all labs ordered are listed, but only abnormal results are displayed) Labs Reviewed - No data to display  EKG   Radiology No results found.  Procedures Procedures (including critical care time)  Medications  Ordered in UC Medications - No data to display  Initial Impression / Assessment and Plan / UC Course  I have reviewed the triage vital signs and the nursing notes.  Pertinent labs & imaging results that were available during my care of the patient were reviewed by me and considered in my medical decision making (see chart for details).  Will treat empirically for acute pansinusitis as patient has had symptoms for 2 weeks, on exam, she has right frontal and maxillary sinus tenderness.  Will treat with Augmentin  875/125 mg tablets, for her nasal congestion and bilateral middle ear effusions, fluticasone 50 mcg nasal spray was prescribed, and cetirizine 10 mg prescribed for nasal congestion, and bilateral middle ear effusion.  Patient was given supportive care recommendations to include over-the-counter Tylenol, normal saline nasal spray, and use of a humidifier in her bedroom at nighttime during sleep.  Patient was advised when follow-up would be indicated.  Patient is in agreement with this plan of care and verbalizes understanding.  All questions were answered.  Patient stable for discharge.  Final Clinical Impressions(s) / UC Diagnoses   Final diagnoses:  Acute pansinusitis, recurrence not specified  Middle ear effusion, bilateral     Discharge Instructions      Take medication as directed. Increase fluids and get plenty of rest. May take over-the-counter Tylenol as needed for pain, fever, or general discomfort. Recommend normal saline nasal spray to help with nasal congestion throughout the day. If you develop a cough, it will be helpful for her to use a humidifier in your bedroom at nighttime during sleep. If symptoms fail to improve with this treatment, please follow-up with your primary care physician for further evaluation. Follow-up as needed.      ED Prescriptions     Medication Sig Dispense Auth. Provider   amoxicillin-clavulanate (AUGMENTIN) 875-125 MG tablet Take 1 tablet by mouth every 12 (twelve) hours. 14 tablet Jekhi Bolin-Warren, Sadie Haber, NP   cetirizine (ZYRTEC) 10 MG tablet Take 1 tablet (10 mg total) by mouth daily. 30 tablet Jaymison Luber-Warren, Sadie Haber, NP   fluconazole (DIFLUCAN) 150 MG tablet Take one tablet by mouth, repeat in 72 hours if symptoms persist. 2 tablet Apollos Tenbrink-Warren, Sadie Haber, NP      PDMP not reviewed this encounter.   Abran Cantor, NP 12/31/22 1750

## 2023-01-04 ENCOUNTER — Encounter (HOSPITAL_COMMUNITY): Payer: Medicare HMO | Admitting: Physical Therapy

## 2023-01-04 ENCOUNTER — Telehealth: Payer: Self-pay | Admitting: Pharmacist

## 2023-01-04 DIAGNOSIS — T161XXA Foreign body in right ear, initial encounter: Secondary | ICD-10-CM | POA: Diagnosis not present

## 2023-01-04 DIAGNOSIS — H9191 Unspecified hearing loss, right ear: Secondary | ICD-10-CM | POA: Diagnosis not present

## 2023-01-04 DIAGNOSIS — H6593 Unspecified nonsuppurative otitis media, bilateral: Secondary | ICD-10-CM | POA: Diagnosis not present

## 2023-01-04 NOTE — Progress Notes (Signed)
01/04/2023  Patient ID: [name], female   DOB: [birth year], 77 y.o.   MRN: [id number]   [facility]) Care Management  [facility]   01/04/2023  [name] 1945-09-30 [id number]  Reason for referral: medication assistance  Referral source: 2025 Medication Assistance  re-enrollment  Referral medication(s): Evaristo Bury, Ozempic, Novolog Current insurance:[employer]  PObjective: Allergies  Allergen Reactions   Oxycodone Itching    Medications Reviewed Today     Reviewed by Beecher Mcardle, [facility] (Pharmacist) on 01/04/23 at 1639  Med List Status: <None>   Medication Order Taking? Sig Documenting Provider Last Dose Status Informant  acetaminophen (TYLENOL) 500 MG tablet [id number] Yes Take 500 mg by mouth every 6 (six) hours as needed for moderate pain (pain score 4-6). [provider] Taking Active   amLODipine (NORVASC) 5 MG tablet [id number] Yes Take 5 mg by mouth daily. [provider] Taking Active Self  amoxicillin-clavulanate (AUGMENTIN) 875-125 MG tablet [id number] Yes Take 1 tablet by mouth every 12 (twelve) hours. Leath-Warren, Sadie Haber, NP Taking Active   aspirin EC (ADULT ASPIRIN REGIMEN) 81 MG tablet [id number] Yes Take 81 mg by mouth every evening. [provider] Taking Active   Biotin w/ Vitamins C & E (HAIR SKIN & NAILS GUMMIES PO) [id number] Yes Take 2 each by mouth daily. [provider] Taking Active Self  cetirizine (ZYRTEC) 10 MG tablet [id number] Yes Take 1 tablet (10 mg total) by mouth daily. Leath-Warren, Sadie Haber, NP Taking Active   DULoxetine (CYMBALTA) 60 MG capsule [id number] Yes Take 60 mg by mouth daily. [provider] Taking Active Self  fluconazole (DIFLUCAN) 150 MG tablet [id number] Yes Take one tablet by mouth, repeat in 72 hours if symptoms persist. Leath-Warren, Sadie Haber, NP Taking Active   insulin degludec (TRESIBA FLEXTOUCH) 200 UNIT/ML FlexTouch Pen [id number] Yes Inject  60 Units into the skin daily. [provider] Taking Active   irbesartan (AVAPRO) 300 MG tablet [id number] Yes Take 300 mg by mouth daily. [provider] Taking Active Self  metoprolol succinate (TOPROL-XL) 50 MG 24 hr tablet [id number] Yes Take 50 mg by mouth daily. [provider] Taking Active Self  Multiple Vitamins-Minerals (MULTIVITAMIN WITH MINERALS) tablet [id number] Yes Take 1 tablet by mouth daily. [provider] Taking Active Self  omeprazole (PRILOSEC) 20 MG capsule [id number] Yes Take 20 mg by mouth daily. [provider] Taking Active Self  rosuvastatin (CRESTOR) 20 MG tablet [id number] Yes Take 20 mg by mouth at bedtime. [provider] Taking Active Self  Semaglutide, 1 MG/DOSE, (OZEMPIC, 1 MG/DOSE,) 4 MG/3ML SOPN [id number] Yes Inject 1mg  as directed Subcutaneous once a week [provider] Taking Active   triamterene-hydrochlorothiazide (MAXZIDE-25) 37.5-25 MG tablet [id number] Yes Take 1 tablet by mouth daily. [provider] Taking Active Self  Med List Note Maple Hudson, Rennis Chris, MD 01/28/11 1012): CPAP reduced 12 to 10, 01/28/11            Assessment:  Medication Assistance Findings:  Medication assistance needs identified: Spoke with Patient over the phone. HIPAA identifiers were obtained.  Patient confirmed she received Ozempic, Evaristo Bury, and Novolog via patient assistance programs last year and will need assistance for 2025.  She appears to still qualify and will be sent re-enrollment applications.       Additional medication assistance options reviewed with patient as warranted:  No other options identified  Plan: I will route patient assistance letter to [facility] pharmacy technician who will coordinate patient assistance  program application process for medications listed above.  [facility] pharmacy technician will assist with obtaining all required documents from both patient and provider(s) and submit  application(s) once completed.     Beecher Mcardle, PharmD, BCACP [facility] Clinical Pharmacist [phone]

## 2023-01-06 ENCOUNTER — Encounter (HOSPITAL_COMMUNITY): Payer: Medicare HMO | Admitting: Physical Therapy

## 2023-01-10 ENCOUNTER — Encounter (HOSPITAL_COMMUNITY): Payer: Medicare HMO | Admitting: Physical Therapy

## 2023-01-11 DIAGNOSIS — Z794 Long term (current) use of insulin: Secondary | ICD-10-CM | POA: Diagnosis not present

## 2023-01-11 DIAGNOSIS — E1169 Type 2 diabetes mellitus with other specified complication: Secondary | ICD-10-CM | POA: Diagnosis not present

## 2023-01-11 DIAGNOSIS — I10 Essential (primary) hypertension: Secondary | ICD-10-CM | POA: Diagnosis not present

## 2023-01-11 DIAGNOSIS — E785 Hyperlipidemia, unspecified: Secondary | ICD-10-CM | POA: Diagnosis not present

## 2023-01-11 DIAGNOSIS — K760 Fatty (change of) liver, not elsewhere classified: Secondary | ICD-10-CM | POA: Diagnosis not present

## 2023-01-13 ENCOUNTER — Encounter (HOSPITAL_COMMUNITY): Payer: Medicare HMO | Admitting: Physical Therapy

## 2023-01-14 ENCOUNTER — Telehealth: Payer: Self-pay | Admitting: Pharmacy Technician

## 2023-01-14 DIAGNOSIS — Z5986 Financial insecurity: Secondary | ICD-10-CM

## 2023-01-14 NOTE — Progress Notes (Signed)
[facility] Customer service manager [facility])                                            [facility] Quality Pharmacy Team    01/14/2023  Malani Cresswell Bordelon [birth year] [id number]                                      Medication Assistance Referral  Referral From: [facility] RPh Katina B.  Medication/Company: Franki Monte / [employer] Patient application portion:  Mining engineer portion: Faxed  to Dr. Geoffry Paradise Provider address/fax verified via: Office website  Medication/Company: Evaristo Bury / [employer] Patient application portion:  Mining engineer portion: Faxed  to Dr. Geoffry Paradise Provider address/fax verified via: Office website  Medication/Company: Rubbie Battiest / [employer] Patient application portion:  Mining engineer portion: Faxed  to Dr. Geoffry Paradise Provider address/fax verified via: Office website  Pattricia Boss, CPhT [place]  Office: [phone] Fax: [phone] Email: [email or web address]

## 2023-01-18 ENCOUNTER — Encounter (HOSPITAL_COMMUNITY): Payer: Medicare HMO | Admitting: Physical Therapy

## 2023-01-20 ENCOUNTER — Encounter (HOSPITAL_COMMUNITY): Payer: Medicare HMO | Admitting: Physical Therapy

## 2023-01-20 ENCOUNTER — Encounter (INDEPENDENT_AMBULATORY_CARE_PROVIDER_SITE_OTHER): Payer: Self-pay | Admitting: Otolaryngology

## 2023-01-28 DIAGNOSIS — E119 Type 2 diabetes mellitus without complications: Secondary | ICD-10-CM | POA: Diagnosis not present

## 2023-02-09 ENCOUNTER — Other Ambulatory Visit: Payer: Self-pay

## 2023-02-09 ENCOUNTER — Ambulatory Visit (HOSPITAL_COMMUNITY): Payer: Medicare HMO

## 2023-02-09 ENCOUNTER — Encounter: Payer: Self-pay | Admitting: Student

## 2023-02-09 ENCOUNTER — Ambulatory Visit (HOSPITAL_COMMUNITY): Payer: Medicare HMO | Attending: Student

## 2023-02-09 DIAGNOSIS — M6281 Muscle weakness (generalized): Secondary | ICD-10-CM | POA: Insufficient documentation

## 2023-02-09 DIAGNOSIS — M25662 Stiffness of left knee, not elsewhere classified: Secondary | ICD-10-CM | POA: Diagnosis not present

## 2023-02-09 DIAGNOSIS — Z77122 Contact with and (suspected) exposure to noise: Secondary | ICD-10-CM | POA: Diagnosis not present

## 2023-02-09 DIAGNOSIS — M79605 Pain in left leg: Secondary | ICD-10-CM | POA: Diagnosis not present

## 2023-02-09 DIAGNOSIS — E039 Hypothyroidism, unspecified: Secondary | ICD-10-CM | POA: Diagnosis not present

## 2023-02-09 DIAGNOSIS — E119 Type 2 diabetes mellitus without complications: Secondary | ICD-10-CM | POA: Diagnosis not present

## 2023-02-09 DIAGNOSIS — R262 Difficulty in walking, not elsewhere classified: Secondary | ICD-10-CM | POA: Diagnosis not present

## 2023-02-09 DIAGNOSIS — H903 Sensorineural hearing loss, bilateral: Secondary | ICD-10-CM | POA: Diagnosis not present

## 2023-02-09 NOTE — Therapy (Signed)
OUTPATIENT PHYSICAL THERAPY LOWER EXTREMITY EVALUATION   Patient Name: [name] MRN: [id number] DOB:Apr 14, 1945, 77 y.o., female Today's Date: 02/10/2023  END OF SESSION:  PT End of Session - 02/10/23 0702     Visit Number 1    Number of Visits 8    Date for PT Re-Evaluation 03/09/23    Authorization Type [employer] Medicare (requested 8 visits)    PT Start Time 1350    PT Stop Time 1430    PT Time Calculation (min) 40 min    Activity Tolerance Patient tolerated treatment well    Behavior During Therapy [facility] for tasks assessed/performed             Past Medical History:  Diagnosis Date   Anxiety    Arthritis    Depression    DVT (deep venous thrombosis) (HCC) 08/02/2022   LLE 08/02/22   Essential hypertension    Eustachian tube dysfunction    Fibromyalgia    GERD (gastroesophageal reflux disease)    Hyperlipidemia    Hypothyroid    Mild carotid artery disease (HCC)    OSA (obstructive sleep apnea)    Osteopenia    Pneumonia    Stroke (HCC)    Type 2 diabetes mellitus (HCC)    Past Surgical History:  Procedure Laterality Date   ANKLE SURGERY     Anteriorvesicourethropexy     BUNIONECTOMY     COLONOSCOPY     NASAL SINUS SURGERY     ORIF TIBIA PLATEAU Left 07/30/2022   Procedure: OPEN REDUCTION INTERNAL FIXATION (ORIF) TIBIAL PLATEAU;  Surgeon: Roby Lofts, MD;  Location: [facility] OR;  Service: Orthopedics;  Laterality: Left;   ORIF TIBIA PLATEAU Left 09/02/2022   Procedure: REVISION FIXATION OF LEFT TIBIAL PLATEAU FRACTURE;  Surgeon: Roby Lofts, MD;  Location: [facility] OR;  Service: Orthopedics;  Laterality: Left;   PATELLA FRACTURE SURGERY  2009   Right   TONSILLECTOMY     TOTAL ABDOMINAL HYSTERECTOMY     WRIST FRACTURE SURGERY  2009   Patient Active Problem List   Diagnosis Date Noted   Closed fracture of left tibial plateau 09/02/2022   Left tibial fracture 07/29/2022   Dyspnea on exertion 09/15/2020   CVA (cerebral vascular accident) (HCC)  04/20/2019   Pain in left hip 10/05/2016   Pain in right hip 10/05/2016   Trochanteric bursitis, right hip 10/05/2016   Trochanteric bursitis, left hip 10/05/2016   Chronic insomnia 01/28/2011   Seasonal and perennial allergic rhinitis 05/28/2010   Lung nodule 05/28/2010   HYPERLIPIDEMIA 11/23/2009   G E R D 11/23/2009   EUSTACHIAN TUBE DYSFUNCTION 11/18/2009   HYPERTENSION 11/18/2009   Unspecified hypothyroidism 03/14/2007   Obstructive sleep apnea 03/14/2007   FIBROMYALGIA 03/14/2007    PCP: Geoffry Paradise, MD  REFERRING PROVIDER: Myrene Galas, MD  REFERRING DIAG: (L) FIBAL PLATEAU FRACTURE ORIF (L) FIBIAL PLANTEAU FX  THERAPY DIAG:  Difficulty in walking, not elsewhere classified  Pain in left leg  Stiffness of left knee, not elsewhere classified  Muscle weakness (generalized)  Rationale for Evaluation and Treatment: Rehabilitation  ONSET DATE: 07/30/22, 09/02/22  SUBJECTIVE:   SUBJECTIVE STATEMENT: EVAL: Arrives to the clinic with L knee and L lower leg pain down to the inside of the L ankle (see below). Patient states that the L knee feels that it will give way. Patient is concerned about having blood clots but has taken her baby aspirin this morning. Patient states that she is numb from the R  knee to the bottom of her foot. Patient has been doing her HEP almost everyday but not all of the exercises. Denies recent falls since she was d/c from PT. Patient states that her issues came about a week after she was D/C from PT. Patient called her MD and requested her MD to refer her back to outpatient PT evaluation and management.  PERTINENT HISTORY: Per Initial Evaluation 12/01/22: [name] is an 77 y.o. female with past medical history of CVA, hypertension, hyperlipidemia, GERD, OSA. Patient sustained a fall on Jul 29, 2022, resulting in a left tibial plateau fracture. Patient underwent ORIF of left tibial plateau by Dr. Jena Gauss on 07/30/2022. She was placed in a  hinged knee brace postoperatively and instructed to be nonweightbearing on the left lower extremity. Patient has been compliant with these weightbearing precautions. When patient was evaluated at her postop appointment on 07/31/2022, she was found to have some loss of fixation of the fracture thus she had a second surgery on 09/02/22.  Pt states that she had HH therapy that stopped about a week ago. PAIN:  Are you having pain? Yes: NPRS scale: 3/10 Pain location: L knee and L lower leg pain down to the inside of the L ankle Pain description: burning, stinging, aching Aggravating factors: walking around 45 minutes, standing around 30-40 minutes, night Relieving factors: Tylenol, laying with the hip in ER and knee flexed (as shown by the patient)  PRECAUTIONS: Fall  RED FLAGS: Bowel or bladder incontinence: No and Cauda equina syndrome: No   WEIGHT BEARING RESTRICTIONS: No  FALLS:  Has patient fallen in last 6 months? Yes. Number of falls 1  LIVING ENVIRONMENT: Lives with: lives with their family Lives in: House/apartment 2nd floor Stairs: Yes: Internal: 12-15 steps; can reach both Has following equipment at home: Quad cane small base, Environmental consultant - 2 wheeled, and Wheelchair (manual)  OCCUPATION: retired  PLOF: Independent and Independent with basic ADLs  PATIENT GOALS: "to walk without limping and not be afraid of falling"  NEXT MD VISIT: 1st week of December 2024  OBJECTIVE:  Note: Objective measures were completed at Evaluation unless otherwise noted.  DIAGNOSTIC FINDINGS:  09/23/22 CT OF THE LEFT KNEE WITHOUT CONTRAST   TECHNIQUE: Multidetector CT imaging of the left knee was performed according to the standard protocol. Multiplanar CT image reconstructions were also generated.   RADIATION DOSE REDUCTION: This exam was performed according to the departmental dose-optimization program which includes automated exposure control, adjustment of the mA and/or kV according to patient  size and/or use of iterative reconstruction technique.   COMPARISON:  07/29/2022   FINDINGS: Bones/Joint/Cartilage   Generalized osteopenia.   Healing comminuted medial and lateral tibial plateau fracture transfixed with a medial sideplate and multiple interlocking screws. 1.5 x 1.5 cm bony defect resulting from distraction of the bony fragments involving the articular surface of the medial tibial plateau. Persistent depressed posterolateral tibial plateau fracture with 6 mm of maximum depression involving an area of 20 x 14 mm of the posterolateral tibial plateau articular surface.   Healing nondisplaced fracture of the fibular neck.   No new fracture or dislocation. Small joint effusion. Moderate-severe osteoarthritis of the scratch them moderate-severe joint space narrowing of the lateral femorotibial compartment.   Ligaments   Ligaments are suboptimally evaluated by CT.   Muscles and Tendons Muscles are normal. No muscle atrophy. No intramuscular fluid collection or hematoma. Quadriceps tendon and patellar tendon are intact.   Soft tissue No fluid collection or hematoma.  No soft tissue mass.   IMPRESSION: 1. Healing ununited comminuted medial and lateral tibial plateau fracture transfixed with a medial sideplate and multiple interlocking screws. 1.5 x 1.5 cm bony defect resulting from distraction of the bony fragments involving the articular surface of the medial tibial plateau. Persistent depressed posterolateral tibial plateau fracture with 6 mm of maximum depression involving an area of 20 x 14 mm of the posterolateral tibial plateau articular surface. 2. Healing nondisplaced fracture of the fibular neck. 3. 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  PATIENT SURVEYS:  ABC scale 20/1600 = 1.3%  COGNITION: Overall cognitive status: Within functional limits for tasks assessed     SENSATION: Not tested  EDEMA: presents with  slight to mild swelling on the L knee Calf girth: L 37 cm, R 35.5 cm  MUSCLE LENGTH: Hamstrings: moderate to severe restriction on L, moderate restriction on R Thomas test: mild to moderate restriction on B Gastrocnemius: mild to moderate restriction on B  POSTURE:  slightly flexed L knee (standing)  PALPATION: Grade 1 tenderness on L popliteal fossa Hypomobile on L patella on all glides  LOWER EXTREMITY ROM:  Active ROM Right eval Left eval  Hip flexion [facility]  Hip extension [facility]  Hip abduction [facility]  Hip adduction    Hip internal rotation    Hip external rotation    Knee flexion 115 100  Knee extension 0 10  Ankle dorsiflexion [facility] WFL  Ankle plantarflexion [facility] WFL  Ankle inversion    Ankle eversion     (Blank rows = not tested)  LOWER EXTREMITY MMT:  MMT Right eval Left eval  Hip flexion 4+ 4-  Hip extension 4+ 4-  Hip abduction 4+ 4-  Hip adduction    Hip internal rotation    Hip external rotation    Knee flexion 4 3-  Knee extension 4 3-  Ankle dorsiflexion 4+ 4+  Ankle plantarflexion 4+ 4+  Ankle inversion    Ankle eversion     (Blank rows = not tested)  LOWER EXTREMITY SPECIAL TESTS:  Hip special tests: Ely's test: positive   FUNCTIONAL TESTS:  5 times sit to stand: 11.25 sec 2 minute walk test: 449 ft with 5/10 pain on the L knee Well's criteria for DVT screening: moderate risk (all criteria scored as 0, except for previously documented DVT = 1)  GAIT: Distance walked: 449 ft Assistive device utilized: None Level of assistance: Complete Independence Comments: done during , antagic gait   TODAY'S TREATMENT:                                                                                                                              DATE:  02/09/23 Evaluation and patient education done    PATIENT EDUCATION:  Education details: Educated on the pathoanatomy of fracture. Educated on the goals and course of rehab. Person educated:  Patient Education method: Explanation Education comprehension: verbalized understanding  HOME  EXERCISE PROGRAM: None provided to date  ASSESSMENT:  CLINICAL IMPRESSION: Patient is a 77 y.o. female who was seen today for physical therapy evaluation and treatment for (L) TIBIAL PLATEAU FRACTURE ORIF (L) TIBIAL PLANTEAU FX. Patient's condition is further defined by difficulty with walking due to pain, weakness, and decreased soft tissue extensibility. Skilled PT is required to address the impairments and functional limitations listed below. Patient is at moderate risk for DVT based on the Well's Criteria. Patient was advised to see her MD about it. Also messaged referring MD via Epic about this concern. Spoke to patient in the morning of 02/10/23 via phone and patient reported that she will have [place] of her LE today.  OBJECTIVE IMPAIRMENTS: Abnormal gait, decreased activity tolerance, difficulty walking, decreased ROM, decreased strength, hypomobility, impaired flexibility, and pain.   ACTIVITY LIMITATIONS: carrying, lifting, bending, sitting, standing, squatting, and stairs  PARTICIPATION LIMITATIONS: meal prep, cleaning, laundry, driving, shopping, community activity, occupation, and yard work  PERSONAL FACTORS: Age are also affecting patient's functional outcome.   REHAB POTENTIAL: Good  CLINICAL DECISION MAKING: Stable/uncomplicated  EVALUATION COMPLEXITY: Low   GOALS: Goals reviewed with patient? Yes  SHORT TERM GOALS: Target date: 02/23/23 Pt will demonstrate indep in HEP to facilitate carry-over of skilled services and improve functional outcomes Goal status: INITIAL   LONG TERM GOALS: Target date: 03/09/23  Pt will decrease 5TSTS by at least 3 seconds in order to demonstrate clinically significant improvement in LE strength   Baseline: 11.25 sec Goal status: INITIAL  2.  Pt will increase by at least 40 ft in order to demonstrate clinically significant improvement in  community ambulation  Baseline: 449 ft Goal status: INITIAL  3.  Patient will demonstrate increase in knee flex ROM by 10 deg and knee ext by 5 deg to facilitate ease in ambulation  Baseline: see above Goal status: INITIAL  4.  Pt will demonstrate increase in LE strength to 4+/5 to facilitate ease and safety in ambulation Baseline: 3-/5 Goal status: INITIAL  5.  Pt will improve ABC scale score by 70% in order to demonstrate improved confidence with balance and safety during ambulation and other ADLs  Baseline: 1.3% Goal status: INITIAL  PLAN:  PT FREQUENCY: 2x/week  PT DURATION: 4 weeks  PLANNED INTERVENTIONS: 97164- PT Re-evaluation, 97110-Therapeutic exercises, 97530- Therapeutic activity, 97112- Neuromuscular re-education, 97535- Self Care, 16109- Manual therapy, 60454- Gait training, 97014- Electrical stimulation (unattended), Patient/Family education, Stair training, Cryotherapy, and Moist heat  PLAN FOR NEXT SESSION: Provide HEP. Begin LE strengthening and flexibility as well as balance training.   Tish Frederickson. Peighton Edgin, PT, DPT, OCS Board-Certified Clinical Specialist in Orthopedic PT PT Compact Privilege # ([place]): [id number] T 02/10/2023, 7:06 AM   [employer] Auth Request  Referring diagnosis code (ICD 10)? N82.956O Treatment diagnosis codes (ICD 10)? (if different than referring diagnosis) R26.2, M79.605, M25.662, M62.81 What was this (referring dx) caused by? [x]  Surgery []  Fall []  Ongoing issue []  Arthritis []  Other: ____________  Laterality: []  Rt [x]  Lt []  Both  Deficits: [x]  Pain [x]  Stiffness [x]  Weakness []  Edema [x]  Balance Deficits []  Coordination [x]  Gait Disturbance [x]  ROM []  Other   Functional Tool Score: ABC scale score = 1.3%  CPT codes: See Planned Interventions listed in the Plan section of the Evaluation.

## 2023-02-10 ENCOUNTER — Ambulatory Visit (HOSPITAL_COMMUNITY)
Admission: RE | Admit: 2023-02-10 | Discharge: 2023-02-10 | Disposition: A | Discharge status: Home / Self Care | Payer: Medicare HMO | Source: Ambulatory Visit | Attending: Student | Admitting: Student

## 2023-02-10 ENCOUNTER — Other Ambulatory Visit (HOSPITAL_COMMUNITY): Payer: Self-pay | Admitting: Student

## 2023-02-10 DIAGNOSIS — M7989 Other specified soft tissue disorders: Secondary | ICD-10-CM

## 2023-02-10 DIAGNOSIS — R600 Localized edema: Secondary | ICD-10-CM | POA: Diagnosis not present

## 2023-02-10 DIAGNOSIS — M79605 Pain in left leg: Secondary | ICD-10-CM | POA: Diagnosis not present

## 2023-02-11 ENCOUNTER — Ambulatory Visit (HOSPITAL_COMMUNITY): Admission: RE | Admit: 2023-02-11 | Payer: Medicare HMO | Source: Ambulatory Visit

## 2023-02-11 ENCOUNTER — Ambulatory Visit (HOSPITAL_COMMUNITY): Payer: Medicare HMO

## 2023-02-11 DIAGNOSIS — M25662 Stiffness of left knee, not elsewhere classified: Secondary | ICD-10-CM

## 2023-02-11 DIAGNOSIS — M6281 Muscle weakness (generalized): Secondary | ICD-10-CM | POA: Diagnosis not present

## 2023-02-11 DIAGNOSIS — M79605 Pain in left leg: Secondary | ICD-10-CM

## 2023-02-11 DIAGNOSIS — R262 Difficulty in walking, not elsewhere classified: Secondary | ICD-10-CM

## 2023-02-11 NOTE — Therapy (Signed)
OUTPATIENT PHYSICAL THERAPY LOWER EXTREMITY TREATMENT   Patient Name: [name] MRN: [id number] DOB:03-22-1946, 77 y.o., female Today's Date: 02/11/2023  END OF SESSION:  PT End of Session - 02/11/23 1259     Visit Number 2    Number of Visits 8    Date for PT Re-Evaluation 03/09/23    Authorization Type [employer] (approved 8 visits)    Authorization Time Period 02/11/23 - 03/09/23    Authorization - Visit Number 1    Authorization - Number of Visits 8    PT Start Time 1300    PT Stop Time 1340    PT Time Calculation (min) 40 min    Activity Tolerance Patient tolerated treatment well    Behavior During Therapy [facility] for tasks assessed/performed             Past Medical History:  Diagnosis Date   Anxiety    Arthritis    Depression    DVT (deep venous thrombosis) (HCC) 08/02/2022   LLE 08/02/22   Essential hypertension    Eustachian tube dysfunction    Fibromyalgia    GERD (gastroesophageal reflux disease)    Hyperlipidemia    Hypothyroid    Mild carotid artery disease (HCC)    OSA (obstructive sleep apnea)    Osteopenia    Pneumonia    Stroke (HCC)    Type 2 diabetes mellitus (HCC)    Past Surgical History:  Procedure Laterality Date   ANKLE SURGERY     Anteriorvesicourethropexy     BUNIONECTOMY     COLONOSCOPY     NASAL SINUS SURGERY     ORIF TIBIA PLATEAU Left 07/30/2022   Procedure: OPEN REDUCTION INTERNAL FIXATION (ORIF) TIBIAL PLATEAU;  Surgeon: Roby Lofts, MD;  Location: [facility] OR;  Service: Orthopedics;  Laterality: Left;   ORIF TIBIA PLATEAU Left 09/02/2022   Procedure: REVISION FIXATION OF LEFT TIBIAL PLATEAU FRACTURE;  Surgeon: Roby Lofts, MD;  Location: [facility] OR;  Service: Orthopedics;  Laterality: Left;   PATELLA FRACTURE SURGERY  2009   Right   TONSILLECTOMY     TOTAL ABDOMINAL HYSTERECTOMY     WRIST FRACTURE SURGERY  2009   Patient Active Problem List   Diagnosis Date Noted   Closed fracture of left tibial plateau  09/02/2022   Left tibial fracture 07/29/2022   Dyspnea on exertion 09/15/2020   CVA (cerebral vascular accident) (HCC) 04/20/2019   Pain in left hip 10/05/2016   Pain in right hip 10/05/2016   Trochanteric bursitis, right hip 10/05/2016   Trochanteric bursitis, left hip 10/05/2016   Chronic insomnia 01/28/2011   Seasonal and perennial allergic rhinitis 05/28/2010   Lung nodule 05/28/2010   HYPERLIPIDEMIA 11/23/2009   G E R D 11/23/2009   EUSTACHIAN TUBE DYSFUNCTION 11/18/2009   HYPERTENSION 11/18/2009   Unspecified hypothyroidism 03/14/2007   Obstructive sleep apnea 03/14/2007   FIBROMYALGIA 03/14/2007    PCP: Geoffry Paradise, MD  REFERRING PROVIDER: Myrene Galas, MD  REFERRING DIAG: (L) FIBAL PLATEAU FRACTURE ORIF (L) FIBIAL PLANTEAU FX  THERAPY DIAG:  Difficulty in walking, not elsewhere classified  Pain in left leg  Stiffness of left knee, not elsewhere classified  Muscle weakness (generalized)  Rationale for Evaluation and Treatment: Rehabilitation  ONSET DATE: 07/30/22, 09/02/22  SUBJECTIVE:   SUBJECTIVE STATEMENT: Pt is doing well today. Patient denies any pain today. Per recent US, patient has no DVT on the L leg.  EVAL: Arrives to the clinic with L knee and L lower  leg pain down to the inside of the L ankle (see below). Patient states that the L knee feels that it will give way. Patient is concerned about having blood clots but has taken her baby aspirin this morning. Patient states that she is numb from the R knee to the bottom of her foot. Patient has been doing her HEP almost everyday but not all of the exercises. Denies recent falls since she was d/c from PT. Patient states that her issues came about a week after she was D/C from PT. Patient called her MD and requested her MD to refer her back to outpatient PT evaluation and management.  PERTINENT HISTORY: Per Initial Evaluation 12/01/22: [name] is an 77 y.o. female with past medical history of  CVA, hypertension, hyperlipidemia, GERD, OSA. Patient sustained a fall on Jul 29, 2022, resulting in a left tibial plateau fracture. Patient underwent ORIF of left tibial plateau by Dr. Jena Gauss on 07/30/2022. She was placed in a hinged knee brace postoperatively and instructed to be nonweightbearing on the left lower extremity. Patient has been compliant with these weightbearing precautions. When patient was evaluated at her postop appointment on 07/31/2022, she was found to have some loss of fixation of the fracture thus she had a second surgery on 09/02/22.  Pt states that she had HH therapy that stopped about a week ago. PAIN:  Are you having pain? Yes: NPRS scale: 3/10 Pain location: L knee and L lower leg pain down to the inside of the L ankle Pain description: burning, stinging, aching Aggravating factors: walking around 45 minutes, standing around 30-40 minutes, night Relieving factors: Tylenol, laying with the hip in ER and knee flexed (as shown by the patient)  PRECAUTIONS: Fall  RED FLAGS: Bowel or bladder incontinence: No and Cauda equina syndrome: No   WEIGHT BEARING RESTRICTIONS: No  FALLS:  Has patient fallen in last 6 months? Yes. Number of falls 1  LIVING ENVIRONMENT: Lives with: lives with their family Lives in: House/apartment 2nd floor Stairs: Yes: Internal: 12-15 steps; can reach both Has following equipment at home: Quad cane small base, Environmental consultant - 2 wheeled, and Wheelchair (manual)  OCCUPATION: retired  PLOF: Independent and Independent with basic ADLs  PATIENT GOALS: "to walk without limping and not be afraid of falling"  NEXT MD VISIT: 1st week of December 2024  OBJECTIVE:  Note: Objective measures were completed at Evaluation unless otherwise noted.  DIAGNOSTIC FINDINGS:  09/23/22 CT OF THE LEFT KNEE WITHOUT CONTRAST   TECHNIQUE: Multidetector CT imaging of the left knee was performed according to the standard protocol. Multiplanar CT image reconstructions  were also generated.   RADIATION DOSE REDUCTION: This exam was performed according to the departmental dose-optimization program which includes automated exposure control, adjustment of the mA and/or kV according to patient size and/or use of iterative reconstruction technique.   COMPARISON:  07/29/2022   FINDINGS: Bones/Joint/Cartilage   Generalized osteopenia.   Healing comminuted medial and lateral tibial plateau fracture transfixed with a medial sideplate and multiple interlocking screws. 1.5 x 1.5 cm bony defect resulting from distraction of the bony fragments involving the articular surface of the medial tibial plateau. Persistent depressed posterolateral tibial plateau fracture with 6 mm of maximum depression involving an area of 20 x 14 mm of the posterolateral tibial plateau articular surface.   Healing nondisplaced fracture of the fibular neck.   No new fracture or dislocation. Small joint effusion. Moderate-severe osteoarthritis of the scratch them moderate-severe joint space narrowing of  the lateral femorotibial compartment.   Ligaments   Ligaments are suboptimally evaluated by CT.   Muscles and Tendons Muscles are normal. No muscle atrophy. No intramuscular fluid collection or hematoma. Quadriceps tendon and patellar tendon are intact.   Soft tissue No fluid collection or hematoma.  No soft tissue mass.   IMPRESSION: 1. Healing ununited comminuted medial and lateral tibial plateau fracture transfixed with a medial sideplate and multiple interlocking screws. 1.5 x 1.5 cm bony defect resulting from distraction of the bony fragments involving the articular surface of the medial tibial plateau. Persistent depressed posterolateral tibial plateau fracture with 6 mm of maximum depression involving an area of 20 x 14 mm of the posterolateral tibial plateau articular surface. 2. Healing nondisplaced fracture of the fibular neck. 3. Moderate-severe osteoarthritis  of the lateral femorotibial compartment.  PATIENT SURVEYS:  ABC scale 20/1600 = 1.3%  COGNITION: Overall cognitive status: Within functional limits for tasks assessed     SENSATION: Not tested  EDEMA: presents with slight to mild swelling on the L knee Calf girth: L 37 cm, R 35.5 cm  MUSCLE LENGTH: Hamstrings: moderate to severe restriction on L, moderate restriction on R Thomas test: mild to moderate restriction on B Gastrocnemius: mild to moderate restriction on B  POSTURE:  slightly flexed L knee (standing)  PALPATION: Grade 1 tenderness on L popliteal fossa Hypomobile on L patella on all glides  LOWER EXTREMITY ROM:  Active ROM Right eval Left eval  Hip flexion [facility]  Hip extension [facility]  Hip abduction [facility]  Hip adduction    Hip internal rotation    Hip external rotation    Knee flexion 115 100  Knee extension 0 10  Ankle dorsiflexion [facility] WFL  Ankle plantarflexion [facility] WFL  Ankle inversion    Ankle eversion     (Blank rows = not tested)  LOWER EXTREMITY MMT:  MMT Right eval Left eval  Hip flexion 4+ 4-  Hip extension 4+ 4-  Hip abduction 4+ 4-  Hip adduction    Hip internal rotation    Hip external rotation    Knee flexion 4 3-  Knee extension 4 3-  Ankle dorsiflexion 4+ 4+  Ankle plantarflexion 4+ 4+  Ankle inversion    Ankle eversion     (Blank rows = not tested)  LOWER EXTREMITY SPECIAL TESTS:  Hip special tests: Ely's test: positive   FUNCTIONAL TESTS:  5 times sit to stand: 11.25 sec 2 minute walk test: 449 ft with 5/10 pain on the L knee Well's criteria for DVT screening: moderate risk (all criteria scored as 0, except for previously documented DVT = 1)  GAIT: Distance walked: 449 ft Assistive device utilized: None Level of assistance: Complete Independence Comments: done during , antagic gait   TODAY'S TREATMENT:  DATE:  02/11/23 NuStep, seat 7, level 1, > 70 spm, 5' Gastrocnemius slant board stretch x 30" x 3 Seated hamstring stretch x 30" x 3 L knee drives, 18" black box x 10 x 3 x 30" stretch at the beginning of each set Step downs (heel taps) on a half foam roll x 10 x 2 Mini squats x 3" x 10 x 2 Standing heel/toe raies x 10 x 2 L step ups, 4" box 10 x 2  02/09/23 Evaluation and patient education done    PATIENT EDUCATION:  Education details: Educated on the pathoanatomy of fracture. Educated on the goals and course of rehab. Person educated: Patient Education method: Explanation Education comprehension: verbalized understanding  HOME EXERCISE PROGRAM: Access Code: [email or web address] URL: [email or web address] Date: 02/11/2023 Prepared by: Krystal Clark  Exercises - Gastroc Stretch on Wall  - 1 x daily - 7 x weekly - 3 reps - 30 hold - Seated Hamstring Stretch  - 1 x daily - 7 x weekly - 3 reps - 30 hold - Mini Squat with Counter Support  - 1 x daily - 7 x weekly - 2 sets - 10 reps - 3 hold - Heel Toe Raises with Counter Support  - 1 x daily - 7 x weekly - 2 sets - 10 reps  ASSESSMENT:  CLINICAL IMPRESSION: Interventions today were geared towards LE strengthening and flexibility. Tolerated all activities without worsening of symptoms. Demonstrated appropriate levels of fatigue. Rest periods provided. Provided slight amount of cueing to ensure correct execution of activity with good carry-over. To date, skilled PT is required to address the impairments and improve function.  EVAL: Patient is a 77 y.o. female who was seen today for physical therapy evaluation and treatment for (L) TIBIAL PLATEAU FRACTURE ORIF (L) TIBIAL PLATEAU FX. Patient's condition is further defined by difficulty with walking due to pain, weakness, and decreased soft tissue extensibility. Skilled PT is required to address the impairments and functional limitations listed below. Patient is at  moderate risk for DVT based on the Well's Criteria. Patient was advised to see her MD about it. Also messaged referring MD via Epic about this concern. Spoke to patient in the morning of 02/10/23 via phone and patient reported that she will have [place] of her LE today.  OBJECTIVE IMPAIRMENTS: Abnormal gait, decreased activity tolerance, difficulty walking, decreased ROM, decreased strength, hypomobility, impaired flexibility, and pain.   ACTIVITY LIMITATIONS: carrying, lifting, bending, sitting, standing, squatting, and stairs  PARTICIPATION LIMITATIONS: meal prep, cleaning, laundry, driving, shopping, community activity, occupation, and yard work  PERSONAL FACTORS: Age are also affecting patient's functional outcome.   REHAB POTENTIAL: Good  CLINICAL DECISION MAKING: Stable/uncomplicated  EVALUATION COMPLEXITY: Low   GOALS: Goals reviewed with patient? Yes  SHORT TERM GOALS: Target date: 02/23/23 Pt will demonstrate indep in HEP to facilitate carry-over of skilled services and improve functional outcomes Goal status: INITIAL   LONG TERM GOALS: Target date: 03/09/23  Pt will decrease 5TSTS by at least 3 seconds in order to demonstrate clinically significant improvement in LE strength   Baseline: 11.25 sec Goal status: INITIAL  2.  Pt will increase by at least 40 ft in order to demonstrate clinically significant improvement in community ambulation  Baseline: 449 ft Goal status: INITIAL  3.  Patient will demonstrate increase in knee flex ROM by 10 deg and knee ext by 5 deg to facilitate ease in ambulation  Baseline: see above Goal status: INITIAL  4.  Pt will demonstrate increase in  LE strength to 4+/5 to facilitate ease and safety in ambulation Baseline: 3-/5 Goal status: INITIAL  5.  Pt will improve ABC scale score by 70% in order to demonstrate improved confidence with balance and safety during ambulation and other ADLs  Baseline: 1.3% Goal status: INITIAL  PLAN:  PT  FREQUENCY: 2x/week  PT DURATION: 4 weeks  PLANNED INTERVENTIONS: 97164- PT Re-evaluation, 97110-Therapeutic exercises, 97530- Therapeutic activity, 97112- Neuromuscular re-education, 97535- Self Care, 13086- Manual therapy, 97116- Gait training, 97014- Electrical stimulation (unattended), Patient/Family education, Stair training, Cryotherapy, and Moist heat  PLAN FOR NEXT SESSION: Continue POC and may progress as tolerated with emphasis on LE strengthening and flexibility as well as balance training.   Tish Frederickson. Darwin Rothlisberger, PT, DPT, OCS Board-Certified Clinical Specialist in Orthopedic PT PT Compact Privilege # ([place]): [id number] T 02/11/2023, 1:04 PM

## 2023-02-15 ENCOUNTER — Encounter (HOSPITAL_COMMUNITY): Payer: Medicare HMO | Admitting: Physical Therapy

## 2023-02-15 DIAGNOSIS — M79675 Pain in left toe(s): Secondary | ICD-10-CM | POA: Diagnosis not present

## 2023-02-15 DIAGNOSIS — S82142D Displaced bicondylar fracture of left tibia, subsequent encounter for closed fracture with routine healing: Secondary | ICD-10-CM | POA: Diagnosis not present

## 2023-02-15 DIAGNOSIS — M25561 Pain in right knee: Secondary | ICD-10-CM | POA: Diagnosis not present

## 2023-02-18 NOTE — Progress Notes (Unsigned)
Patient ID: [name], female    DOB: [birth year], 77 y.o.   MRN: [id number]  HPI F followed for sleep apnea, allergic rhinitis, hx of lung nodule. NPSG 03/05/07- AHI 31.7/ hr, desaturation to 85%, CPAP to 14, body weight 195 lbs PFT 02/17/21- WNL -------------------------------------------------------------------------------- 02/18/22- 77 year old female never smoker followed for OSA/CPAP, DOE, allergic rhinitis, prior lung nodule, complicated by DM 2, GERD, HBP, hypothyroid, CVA/ plavix CPAP auto 8-15/ [facility]    AirSense 10 AutoSet Download-compliance 67%, AHI 2.3/ hr Body weight today-184 lbs Covid vax-6 Moderna Flu vax- We ordered repair or replace CPAP in Sept.> not eligible until next year. Download reviewed.  She has a lot of short nights but after discussion, she does not want sleep medication. She may be having some tubing leak at times.  Otherwise machine works well.  She can discuss problems with DME company. Allergic rhinitis is controlled with saline rinse and occasional Sudafed.  02/21/23- 77 year old female never smoker followed for OSA/CPAP, DOE, allergic rhinitis, prior lung nodule, complicated by DM 2, GERD, HBP, hypothyroid, CVA/ plavix CPAP auto 8-15/ [facility]    AirSense 10 AutoSet Download-compliance  Body weight today- ED in Oct with acute pansinusitis. Doppler for left leg pain Neg for DVT in Nov.    Review of Systems-See HPI  + = positive Constitutional:   No-   weight loss, , fevers, chills, fatigue, lassitude. HEENT:   Mild  headaches, No-difficulty swallowing, tooth/dental problems, sore throat,       No-  sneezing, itching, ear ache,   +nasal congestion, +post nasal drip- improved,  CV:  No-   chest pain, orthopnea, PND, swelling in lower extremities, anasarca, dizziness, palpitations Resp: +shortness of breath with exertion or at rest.              No-   productive cough,  No non-productive cough,  No-  coughing up of  blood.              No-   change in color of mucus.  No- wheezing.   Skin: No-   rash or lesions. GI:  No-   heartburn, indigestion, abdominal pain, nausea, vomiting,  GU: . MS:  +   joint pain or swelling.  Neuro- nothing unusual:  Psych:  No- change in mood or affect. No depression or anxiety.  No memory loss.  Objective:   Physical Exam General- Alert, Oriented, Affect-appropriate, Distress- none acute, +overweight Skin- rash-none, lesions- none, excoriation- none Lymphadenopathy- none Head- atraumatic            Eyes- Gross vision intact, PERRLA, conjunctivae clear secretions            Ears- Hearing, canals-normal            Nose- turbinate edema, no-Septal dev, mucus, polyps, erosion, perforation             Throat- Mallampati III , mucosa Dry, drainage- none, tonsils- atrophic Neck- flexible , trachea midline, no stridor , thyroid nl, carotid no bruit Chest - symmetrical excursion , unlabored           Heart/CV- RRR ,  +Murmur 1/6 systolic AS , no gallop  , no rub, nl s1 s2                           - JVD- none , edema- none, stasis changes- none, varices- none  Lung- clear to P&A, wheeze- none, cough- none , dullness-none, rub- none           Chest wall-  Abd-  Br/ Gen/ Rectal- Not done, not indicated Extrem- cyanosis- none, clubbing, none, atrophy- none, strength- nl. Neuro- grossly intact to observation

## 2023-02-21 ENCOUNTER — Ambulatory Visit: Payer: Medicare HMO | Admitting: Internal Medicine

## 2023-02-21 ENCOUNTER — Encounter: Payer: Self-pay | Admitting: Internal Medicine

## 2023-02-21 VITALS — BP 111/64 | HR 69 | Temp 97.8°F | Ht 63.5 in | Wt 176.0 lb

## 2023-02-21 DIAGNOSIS — G4733 Obstructive sleep apnea (adult) (pediatric): Secondary | ICD-10-CM

## 2023-02-21 DIAGNOSIS — R911 Solitary pulmonary nodule: Secondary | ICD-10-CM

## 2023-02-21 NOTE — Patient Instructions (Signed)
Order- DME [employer]- please replace old CPAP machine, auto 8-15, mask of choice, humidifier, supplies, AirView/ card. Please face to face refit mask for better fit and seal.

## 2023-02-22 ENCOUNTER — Ambulatory Visit (HOSPITAL_COMMUNITY): Payer: Medicare HMO | Attending: Student

## 2023-02-22 DIAGNOSIS — M79605 Pain in left leg: Secondary | ICD-10-CM | POA: Diagnosis not present

## 2023-02-22 DIAGNOSIS — M6281 Muscle weakness (generalized): Secondary | ICD-10-CM

## 2023-02-22 DIAGNOSIS — R262 Difficulty in walking, not elsewhere classified: Secondary | ICD-10-CM

## 2023-02-22 DIAGNOSIS — M25662 Stiffness of left knee, not elsewhere classified: Secondary | ICD-10-CM | POA: Diagnosis not present

## 2023-02-22 NOTE — Therapy (Signed)
OUTPATIENT PHYSICAL THERAPY LOWER EXTREMITY TREATMENT   Patient Name: [name] MRN: [id number] DOB:1946-01-12, 77 y.o., female Today's Date: 02/22/2023  END OF SESSION:  PT End of Session - 02/22/23 1340     Visit Number 3    Number of Visits 8    Date for PT Re-Evaluation 03/09/23    Authorization Type [employer] (approved 8 visits)    Authorization Time Period 02/11/23 - 03/09/23    Authorization - Number of Visits 8    PT Start Time 0105    PT Stop Time 0143    PT Time Calculation (min) 38 min    Activity Tolerance Patient tolerated treatment well    Behavior During Therapy [facility] for tasks assessed/performed              Past Medical History:  Diagnosis Date   Anxiety    Arthritis    Depression    DVT (deep venous thrombosis) (HCC) 08/02/2022   LLE 08/02/22   Essential hypertension    Eustachian tube dysfunction    Fibromyalgia    GERD (gastroesophageal reflux disease)    Hyperlipidemia    Hypothyroid    Mild carotid artery disease (HCC)    OSA (obstructive sleep apnea)    Osteopenia    Pneumonia    Stroke (HCC)    Type 2 diabetes mellitus (HCC)    Past Surgical History:  Procedure Laterality Date   ANKLE SURGERY     Anteriorvesicourethropexy     BUNIONECTOMY     COLONOSCOPY     NASAL SINUS SURGERY     ORIF TIBIA PLATEAU Left 07/30/2022   Procedure: OPEN REDUCTION INTERNAL FIXATION (ORIF) TIBIAL PLATEAU;  Surgeon: Roby Lofts, MD;  Location: [facility] OR;  Service: Orthopedics;  Laterality: Left;   ORIF TIBIA PLATEAU Left 09/02/2022   Procedure: REVISION FIXATION OF LEFT TIBIAL PLATEAU FRACTURE;  Surgeon: Roby Lofts, MD;  Location: [facility] OR;  Service: Orthopedics;  Laterality: Left;   PATELLA FRACTURE SURGERY  2009   Right   TONSILLECTOMY     TOTAL ABDOMINAL HYSTERECTOMY     WRIST FRACTURE SURGERY  2009   Patient Active Problem List   Diagnosis Date Noted   Closed fracture of left tibial plateau 09/02/2022   Left tibial fracture  07/29/2022   Dyspnea on exertion 09/15/2020   CVA (cerebral vascular accident) (HCC) 04/20/2019   Pain in left hip 10/05/2016   Pain in right hip 10/05/2016   Trochanteric bursitis, right hip 10/05/2016   Trochanteric bursitis, left hip 10/05/2016   Chronic insomnia 01/28/2011   Seasonal and perennial allergic rhinitis 05/28/2010   Lung nodule 05/28/2010   HYPERLIPIDEMIA 11/23/2009   G E R D 11/23/2009   EUSTACHIAN TUBE DYSFUNCTION 11/18/2009   HYPERTENSION 11/18/2009   Unspecified hypothyroidism 03/14/2007   Obstructive sleep apnea 03/14/2007   FIBROMYALGIA 03/14/2007    PCP: Geoffry Paradise, MD  REFERRING PROVIDER: Myrene Galas, MD  REFERRING DIAG: (L) FIBAL PLATEAU FRACTURE ORIF (L) FIBIAL PLANTEAU FX  THERAPY DIAG:  Difficulty in walking, not elsewhere classified  Pain in left leg  Stiffness of left knee, not elsewhere classified  Muscle weakness (generalized)  Rationale for Evaluation and Treatment: Rehabilitation  ONSET DATE: 07/30/22, 09/02/22  SUBJECTIVE:   SUBJECTIVE STATEMENT: Patient states she stubbed her left 4th toe over the weekend; patient had f/u with MD due to swelling/ bruising. Patient states MD found no fracture; FWB.   EVAL: Arrives to the clinic with L knee and L lower  leg pain down to the inside of the L ankle (see below). Patient states that the L knee feels that it will give way. Patient is concerned about having blood clots but has taken her baby aspirin this morning. Patient states that she is numb from the R knee to the bottom of her foot. Patient has been doing her HEP almost everyday but not all of the exercises. Denies recent falls since she was d/c from PT. Patient states that her issues came about a week after she was D/C from PT. Patient called her MD and requested her MD to refer her back to outpatient PT evaluation and management.  PERTINENT HISTORY: Per Initial Evaluation 12/01/22: [name] is an 77 y.o. female with past  medical history of CVA, hypertension, hyperlipidemia, GERD, OSA. Patient sustained a fall on Jul 29, 2022, resulting in a left tibial plateau fracture. Patient underwent ORIF of left tibial plateau by Dr. Jena Gauss on 07/30/2022. She was placed in a hinged knee brace postoperatively and instructed to be nonweightbearing on the left lower extremity. Patient has been compliant with these weightbearing precautions. When patient was evaluated at her postop appointment on 07/31/2022, she was found to have some loss of fixation of the fracture thus she had a second surgery on 09/02/22.  Pt states that she had HH therapy that stopped about a week ago. PAIN:  Are you having pain? Yes: NPRS scale: 3/10 Pain location: L knee and L lower leg pain down to the inside of the L ankle Pain description: burning, stinging, aching Aggravating factors: walking around 45 minutes, standing around 30-40 minutes, night Relieving factors: Tylenol, laying with the hip in ER and knee flexed (as shown by the patient)  PRECAUTIONS: Fall  RED FLAGS: Bowel or bladder incontinence: No and Cauda equina syndrome: No   WEIGHT BEARING RESTRICTIONS: No  FALLS:  Has patient fallen in last 6 months? Yes. Number of falls 1  LIVING ENVIRONMENT: Lives with: lives with their family Lives in: House/apartment 2nd floor Stairs: Yes: Internal: 12-15 steps; can reach both Has following equipment at home: Quad cane small base, Environmental consultant - 2 wheeled, and Wheelchair (manual)  OCCUPATION: retired  PLOF: Independent and Independent with basic ADLs  PATIENT GOALS: "to walk without limping and not be afraid of falling"  NEXT MD VISIT: 1st week of December 2024  OBJECTIVE:  Note: Objective measures were completed at Evaluation unless otherwise noted.  DIAGNOSTIC FINDINGS:  09/23/22 CT OF THE LEFT KNEE WITHOUT CONTRAST   TECHNIQUE: Multidetector CT imaging of the left knee was performed according to the standard protocol. Multiplanar CT image  reconstructions were also generated.   RADIATION DOSE REDUCTION: This exam was performed according to the departmental dose-optimization program which includes automated exposure control, adjustment of the mA and/or kV according to patient size and/or use of iterative reconstruction technique.   COMPARISON:  07/29/2022   FINDINGS: Bones/Joint/Cartilage   Generalized osteopenia.   Healing comminuted medial and lateral tibial plateau fracture transfixed with a medial sideplate and multiple interlocking screws. 1.5 x 1.5 cm bony defect resulting from distraction of the bony fragments involving the articular surface of the medial tibial plateau. Persistent depressed posterolateral tibial plateau fracture with 6 mm of maximum depression involving an area of 20 x 14 mm of the posterolateral tibial plateau articular surface.   Healing nondisplaced fracture of the fibular neck.   No new fracture or dislocation. Small joint effusion. Moderate-severe osteoarthritis of the scratch them moderate-severe joint space narrowing of  the lateral femorotibial compartment.   Ligaments   Ligaments are suboptimally evaluated by CT.   Muscles and Tendons Muscles are normal. No muscle atrophy. No intramuscular fluid collection or hematoma. Quadriceps tendon and patellar tendon are intact.   Soft tissue No fluid collection or hematoma.  No soft tissue mass.   IMPRESSION: 1. Healing ununited comminuted medial and lateral tibial plateau fracture transfixed with a medial sideplate and multiple interlocking screws. 1.5 x 1.5 cm bony defect resulting from distraction of the bony fragments involving the articular surface of the medial tibial plateau. Persistent depressed posterolateral tibial plateau fracture with 6 mm of maximum depression involving an area of 20 x 14 mm of the posterolateral tibial plateau articular surface. 2. Healing nondisplaced fracture of the fibular neck. 3.  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  PATIENT SURVEYS:  ABC scale 20/1600 = 1.3%  COGNITION: Overall cognitive status: Within functional limits for tasks assessed     SENSATION: Not tested  EDEMA: presents with slight to mild swelling on the L knee Calf girth: L 37 cm, R 35.5 cm  MUSCLE LENGTH: Hamstrings: moderate to severe restriction on L, moderate restriction on R Thomas test: mild to moderate restriction on B Gastrocnemius: mild to moderate restriction on B  POSTURE:  slightly flexed L knee (standing)  PALPATION: Grade 1 tenderness on L popliteal fossa Hypomobile on L patella on all glides  LOWER EXTREMITY ROM:  Active ROM Right eval Left eval  Hip flexion [facility]  Hip extension [facility]  Hip abduction [facility]  Hip adduction    Hip internal rotation    Hip external rotation    Knee flexion 115 100  Knee extension 0 (10)  Ankle dorsiflexion [facility] WFL  Ankle plantarflexion [facility] WFL  Ankle inversion    Ankle eversion     (Blank rows = not tested)  LOWER EXTREMITY MMT:  MMT Right eval Left eval  Hip flexion 4+ 4-  Hip extension 4+ 4-  Hip abduction 4+ 4-  Hip adduction    Hip internal rotation    Hip external rotation    Knee flexion 4 3-  Knee extension 4 3-  Ankle dorsiflexion 4+ 4+  Ankle plantarflexion 4+ 4+  Ankle inversion    Ankle eversion     (Blank rows = not tested)  LOWER EXTREMITY SPECIAL TESTS:  Hip special tests: Ely's test: positive   FUNCTIONAL TESTS:  5 times sit to stand: 11.25 sec 2 minute walk test: 449 ft with 5/10 pain on the L knee Well's criteria for DVT screening: moderate risk (all criteria scored as 0, except for previously documented DVT = 1)  GAIT: Distance walked: 449 ft Assistive device utilized: None Level of assistance: Complete Independence Comments: done during , antagic gait   TODAY'S TREATMENT:  DATE:   02/22/23 Supine  Left quad sets into half foam + PT manual cues 10x3-5"H   Left SAQS x10 with 2# then 3#    Left partial SLRs off of foam roll 2x10   Leg press with 30# 2x10   02/11/23 NuStep, seat 7, level 1, > 70 spm, 5' Gastrocnemius slant board stretch x 30" x 3 Seated hamstring stretch x 30" x 3 L knee drives, 18" black box x 10 x 3 x 30" stretch at the beginning of each set Step downs (heel taps) on a half foam roll x 10 x 2 Mini squats x 3" x 10 x 2 Standing heel/toe raies x 10 x 2 L step ups, 4" box 10 x 2  02/09/23 Evaluation and patient education done    PATIENT EDUCATION:  Education details: Educated on the pathoanatomy of fracture. Educated on the goals and course of rehab. Person educated: Patient Education method: Explanation Education comprehension: verbalized understanding  HOME EXERCISE PROGRAM: Access Code: [email or web address] URL: [email or web address] Date: 02/11/2023 Prepared by: Krystal Clark  Exercises - Gastroc Stretch on Wall  - 1 x daily - 7 x weekly - 3 reps - 30 hold - Seated Hamstring Stretch  - 1 x daily - 7 x weekly - 3 reps - 30 hold - Mini Squat with Counter Support  - 1 x daily - 7 x weekly - 2 sets - 10 reps - 3 hold - Heel Toe Raises with Counter Support  - 1 x daily - 7 x weekly - 2 sets - 10 reps  ASSESSMENT:  CLINICAL IMPRESSION: Interventions today were geared towards LE strengthening and flexibility. PT to notice decreased left quad activation. Progressed patient with quad sets, SAQs, then finally SLRS with minimal shakiness due to weakness/ fatigue. Tolerated all activities without worsening of symptoms. Demonstrated appropriate levels of fatigue. Rest periods provided. Provided slight amount of cueing to ensure correct execution of activity with good carry-over. To date, skilled PT is required to address the impairments and improve function.  EVAL: Patient is a 77  y.o. female who was seen today for physical therapy evaluation and treatment for (L) TIBIAL PLATEAU FRACTURE ORIF (L) TIBIAL PLATEAU FX. Patient's condition is further defined by difficulty with walking due to pain, weakness, and decreased soft tissue extensibility. Skilled PT is required to address the impairments and functional limitations listed below. Patient is at moderate risk for DVT based on the Well's Criteria. Patient was advised to see her MD about it. Also messaged referring MD via Epic about this concern. Spoke to patient in the morning of 02/10/23 via phone and patient reported that she will have [place] of her LE today.  OBJECTIVE IMPAIRMENTS: Abnormal gait, decreased activity tolerance, difficulty walking, decreased ROM, decreased strength, hypomobility, impaired flexibility, and pain.   ACTIVITY LIMITATIONS: carrying, lifting, bending, sitting, standing, squatting, and stairs  PARTICIPATION LIMITATIONS: meal prep, cleaning, laundry, driving, shopping, community activity, occupation, and yard work  PERSONAL FACTORS: Age are also affecting patient's functional outcome.   REHAB POTENTIAL: Good  CLINICAL DECISION MAKING: Stable/uncomplicated  EVALUATION COMPLEXITY: Low   GOALS: Goals reviewed with patient? Yes  SHORT TERM GOALS: Target date: 02/23/23 Pt will demonstrate indep in HEP to facilitate carry-over of skilled services and improve functional outcomes Goal status: INITIAL   LONG TERM GOALS: Target date: 03/09/23  Pt will decrease 5TSTS by at least 3 seconds in order to demonstrate clinically significant improvement in LE strength   Baseline: 11.25 sec Goal status: INITIAL  2.  Pt will increase by at least 40 ft in order to demonstrate clinically significant improvement in community ambulation  Baseline: 449 ft Goal status: INITIAL  3.  Patient will demonstrate increase in knee flex ROM by 10 deg and knee ext by 5 deg to facilitate ease in ambulation  Baseline:  see above Goal status: INITIAL  4.  Pt will demonstrate increase in LE strength to 4+/5 to facilitate ease and safety in ambulation Baseline: 3-/5 Goal status: INITIAL  5.  Pt will improve ABC scale score by 70% in order to demonstrate improved confidence with balance and safety during ambulation and other ADLs  Baseline: 1.3% Goal status: INITIAL  PLAN:  PT FREQUENCY: 2x/week  PT DURATION: 4 weeks  PLANNED INTERVENTIONS: 97164- PT Re-evaluation, 97110-Therapeutic exercises, 97530- Therapeutic activity, 97112- Neuromuscular re-education, 97535- Self Care, [id number]- Manual therapy, 97116- Gait training, 97014- Electrical stimulation (unattended), Patient/Family education, Stair training, Cryotherapy, and Moist heat  PLAN FOR NEXT SESSION: Continue POC and may progress as tolerated with emphasis on LE strengthening and flexibility as well as balance training.   Seymour Bars PT, DPT  02/22/2023, 1:41 PM

## 2023-02-23 ENCOUNTER — Telehealth: Payer: Self-pay | Admitting: Internal Medicine

## 2023-02-23 ENCOUNTER — Encounter: Payer: Self-pay | Admitting: Internal Medicine

## 2023-02-23 DIAGNOSIS — G4733 Obstructive sleep apnea (adult) (pediatric): Secondary | ICD-10-CM

## 2023-02-23 NOTE — Assessment & Plan Note (Signed)
No concern on follow-up CXRs as recently as 07/2022 Plan-imaging only as needed.

## 2023-02-23 NOTE — Assessment & Plan Note (Signed)
Benefits from CPAP and has been very compliant long-term.  Having some trouble with mask leak and machine is old. Plan-replace old machine, auto 8-15, refit mask.  If insurance requires, we will have to update with a home sleep test.

## 2023-02-23 NOTE — Telephone Encounter (Signed)
Unfortunately, we do not have a copy of her sleep study report. Her sleep study was done in 2008, before records were computerized. [employer] needs an updated sleep study before we can replace her old CPAP machine.  Please order Home Sleep Test (off CPAP) for dx OSA Ask her to call e for the report about 2 weeks after the study is done.

## 2023-02-24 ENCOUNTER — Ambulatory Visit (HOSPITAL_COMMUNITY): Payer: Medicare HMO

## 2023-02-24 DIAGNOSIS — M6281 Muscle weakness (generalized): Secondary | ICD-10-CM | POA: Diagnosis not present

## 2023-02-24 DIAGNOSIS — M25662 Stiffness of left knee, not elsewhere classified: Secondary | ICD-10-CM | POA: Diagnosis not present

## 2023-02-24 DIAGNOSIS — R262 Difficulty in walking, not elsewhere classified: Secondary | ICD-10-CM

## 2023-02-24 DIAGNOSIS — M79605 Pain in left leg: Secondary | ICD-10-CM

## 2023-02-24 NOTE — Therapy (Signed)
OUTPATIENT PHYSICAL THERAPY LOWER EXTREMITY TREATMENT   Patient Name: [name] MRN: [id number] DOB:January 26, 1946, 77 y.o., female Today's Date: 02/24/2023  END OF SESSION:  PT End of Session - 02/24/23 1349     Visit Number 4    Number of Visits 8    Date for PT Re-Evaluation 03/09/23    Authorization Type [employer] (approved 8 visits)    Authorization Time Period 02/11/23 - 03/09/23    Authorization - Number of Visits 8    PT Start Time 0145    PT Stop Time 0225    PT Time Calculation (min) 40 min    Activity Tolerance Patient tolerated treatment well    Behavior During Therapy [facility] for tasks assessed/performed              Past Medical History:  Diagnosis Date   Anxiety    Arthritis    Depression    DVT (deep venous thrombosis) (HCC) 08/02/2022   LLE 08/02/22   Essential hypertension    Eustachian tube dysfunction    Fibromyalgia    GERD (gastroesophageal reflux disease)    Hyperlipidemia    Hypothyroid    Mild carotid artery disease (HCC)    OSA (obstructive sleep apnea)    Osteopenia    Pneumonia    Stroke (HCC)    Type 2 diabetes mellitus (HCC)    Past Surgical History:  Procedure Laterality Date   ANKLE SURGERY     Anteriorvesicourethropexy     BUNIONECTOMY     COLONOSCOPY     NASAL SINUS SURGERY     ORIF TIBIA PLATEAU Left 07/30/2022   Procedure: OPEN REDUCTION INTERNAL FIXATION (ORIF) TIBIAL PLATEAU;  Surgeon: Roby Lofts, MD;  Location: [facility] OR;  Service: Orthopedics;  Laterality: Left;   ORIF TIBIA PLATEAU Left 09/02/2022   Procedure: REVISION FIXATION OF LEFT TIBIAL PLATEAU FRACTURE;  Surgeon: Roby Lofts, MD;  Location: [facility] OR;  Service: Orthopedics;  Laterality: Left;   PATELLA FRACTURE SURGERY  2009   Right   TONSILLECTOMY     TOTAL ABDOMINAL HYSTERECTOMY     WRIST FRACTURE SURGERY  2009   Patient Active Problem List   Diagnosis Date Noted   Closed fracture of left tibial plateau 09/02/2022   Left tibial fracture  07/29/2022   Dyspnea on exertion 09/15/2020   CVA (cerebral vascular accident) (HCC) 04/20/2019   Pain in left hip 10/05/2016   Pain in right hip 10/05/2016   Trochanteric bursitis, right hip 10/05/2016   Trochanteric bursitis, left hip 10/05/2016   Chronic insomnia 01/28/2011   Seasonal and perennial allergic rhinitis 05/28/2010   Lung nodule 05/28/2010   HYPERLIPIDEMIA 11/23/2009   G E R D 11/23/2009   EUSTACHIAN TUBE DYSFUNCTION 11/18/2009   HYPERTENSION 11/18/2009   Unspecified hypothyroidism 03/14/2007   Obstructive sleep apnea 03/14/2007   FIBROMYALGIA 03/14/2007    PCP: Geoffry Paradise, MD  REFERRING PROVIDER: Myrene Galas, MD  REFERRING DIAG: (L) FIBAL PLATEAU FRACTURE ORIF (L) FIBIAL PLANTEAU FX  THERAPY DIAG:  Difficulty in walking, not elsewhere classified  Pain in left leg  Stiffness of left knee, not elsewhere classified  Muscle weakness (generalized)  Rationale for Evaluation and Treatment: Rehabilitation  ONSET DATE: 07/30/22, 09/02/22  SUBJECTIVE:   SUBJECTIVE STATEMENT: Patient states she feels better after the last session but was sore.   EVAL: Arrives to the clinic with L knee and L lower leg pain down to the inside of the L ankle (see below). Patient states that  the L knee feels that it will give way. Patient is concerned about having blood clots but has taken her baby aspirin this morning. Patient states that she is numb from the R knee to the bottom of her foot. Patient has been doing her HEP almost everyday but not all of the exercises. Denies recent falls since she was d/c from PT. Patient states that her issues came about a week after she was D/C from PT. Patient called her MD and requested her MD to refer her back to outpatient PT evaluation and management.  PERTINENT HISTORY: Per Initial Evaluation 12/01/22: [name] is an 77 y.o. female with past medical history of CVA, hypertension, hyperlipidemia, GERD, OSA. Patient sustained a fall  on Jul 29, 2022, resulting in a left tibial plateau fracture. Patient underwent ORIF of left tibial plateau by Dr. Jena Gauss on 07/30/2022. She was placed in a hinged knee brace postoperatively and instructed to be nonweightbearing on the left lower extremity. Patient has been compliant with these weightbearing precautions. When patient was evaluated at her postop appointment on 07/31/2022, she was found to have some loss of fixation of the fracture thus she had a second surgery on 09/02/22.  Pt states that she had HH therapy that stopped about a week ago. PAIN:  Are you having pain? Yes: NPRS scale: 3/10 Pain location: L knee and L lower leg pain down to the inside of the L ankle Pain description: burning, stinging, aching Aggravating factors: walking around 45 minutes, standing around 30-40 minutes, night Relieving factors: Tylenol, laying with the hip in ER and knee flexed (as shown by the patient)  PRECAUTIONS: Fall  RED FLAGS: Bowel or bladder incontinence: No and Cauda equina syndrome: No   WEIGHT BEARING RESTRICTIONS: No  FALLS:  Has patient fallen in last 6 months? Yes. Number of falls 1  LIVING ENVIRONMENT: Lives with: lives with their family Lives in: House/apartment 2nd floor Stairs: Yes: Internal: 12-15 steps; can reach both Has following equipment at home: Quad cane small base, Environmental consultant - 2 wheeled, and Wheelchair (manual)  OCCUPATION: retired  PLOF: Independent and Independent with basic ADLs  PATIENT GOALS: "to walk without limping and not be afraid of falling"  NEXT MD VISIT: 1st week of December 2024  OBJECTIVE:  Note: Objective measures were completed at Evaluation unless otherwise noted.  DIAGNOSTIC FINDINGS:  09/23/22 CT OF THE LEFT KNEE WITHOUT CONTRAST   TECHNIQUE: Multidetector CT imaging of the left knee was performed according to the standard protocol. Multiplanar CT image reconstructions were also generated.   RADIATION DOSE REDUCTION: This exam was  performed according to the departmental dose-optimization program which includes automated exposure control, adjustment of the mA and/or kV according to patient size and/or use of iterative reconstruction technique.   COMPARISON:  07/29/2022   FINDINGS: Bones/Joint/Cartilage   Generalized osteopenia.   Healing comminuted medial and lateral tibial plateau fracture transfixed with a medial sideplate and multiple interlocking screws. 1.5 x 1.5 cm bony defect resulting from distraction of the bony fragments involving the articular surface of the medial tibial plateau. Persistent depressed posterolateral tibial plateau fracture with 6 mm of maximum depression involving an area of 20 x 14 mm of the posterolateral tibial plateau articular surface.   Healing nondisplaced fracture of the fibular neck.   No new fracture or dislocation. Small joint effusion. Moderate-severe osteoarthritis of the scratch them moderate-severe joint space narrowing of the lateral femorotibial compartment.   Ligaments   Ligaments are suboptimally evaluated by CT.  Muscles and Tendons Muscles are normal. No muscle atrophy. No intramuscular fluid collection or hematoma. Quadriceps tendon and patellar tendon are intact.   Soft tissue No fluid collection or hematoma.  No soft tissue mass.   IMPRESSION: 1. Healing ununited comminuted medial and lateral tibial plateau fracture transfixed with a medial sideplate and multiple interlocking screws. 1.5 x 1.5 cm bony defect resulting from distraction of the bony fragments involving the articular surface of the medial tibial plateau. Persistent depressed posterolateral tibial plateau fracture with 6 mm of maximum depression involving an area of 20 x 14 mm of the posterolateral tibial plateau articular surface. 2. Healing nondisplaced fracture of the fibular neck. 3. 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  PATIENT SURVEYS:   ABC scale 20/1600 = 1.3%  COGNITION: Overall cognitive status: Within functional limits for tasks assessed     SENSATION: Not tested  EDEMA: presents with slight to mild swelling on the L knee Calf girth: L 37 cm, R 35.5 cm  MUSCLE LENGTH: Hamstrings: moderate to severe restriction on L, moderate restriction on R Thomas test: mild to moderate restriction on B Gastrocnemius: mild to moderate restriction on B  POSTURE:  slightly flexed L knee (standing)  PALPATION: Grade 1 tenderness on L popliteal fossa Hypomobile on L patella on all glides  LOWER EXTREMITY ROM:  Active ROM Right eval Left eval  Hip flexion [facility]  Hip extension [facility]  Hip abduction [facility]  Hip adduction    Hip internal rotation    Hip external rotation    Knee flexion 115 100  Knee extension 0 (10)  Ankle dorsiflexion [facility] WFL  Ankle plantarflexion [facility] WFL  Ankle inversion    Ankle eversion     (Blank rows = not tested)  LOWER EXTREMITY MMT:  MMT Right eval Left eval  Hip flexion 4+ 4-  Hip extension 4+ 4-  Hip abduction 4+ 4-  Hip adduction    Hip internal rotation    Hip external rotation    Knee flexion 4 3-  Knee extension 4 3-  Ankle dorsiflexion 4+ 4+  Ankle plantarflexion 4+ 4+  Ankle inversion    Ankle eversion     (Blank rows = not tested)  LOWER EXTREMITY SPECIAL TESTS:  Hip special tests: Ely's test: positive   FUNCTIONAL TESTS:  5 times sit to stand: 11.25 sec 2 minute walk test: 449 ft with 5/10 pain on the L knee Well's criteria for DVT screening: moderate risk (all criteria scored as 0, except for previously documented DVT = 1)  GAIT: Distance walked: 449 ft Assistive device utilized: None Level of assistance: Complete Independence Comments: done during , antagic gait   TODAY'S TREATMENT:                                                                                                                              DATE:   02/24/23 Supine Manual  therapy: soft tissue mobilization/  instrument-assisted soft tissue mobilization  to left lower extremity for swelling/ pain management   Left quad sets into half foam + PT manual cues 10x3-5"H   Left SAQS x10 with 2# then 3#    Left partial SLRs off of foam roll 2x10  Gait Training with single point cane, 2 pt step through  02/22/23 Supine  Left quad sets into half foam + PT manual cues 10x3-5"H   Left SAQS x10 with 2# then 3#    Left partial SLRs off of foam roll 2x10   Leg press with 30# 2x10   02/11/23 NuStep, seat 7, level 1, > 70 spm, 5' Gastrocnemius slant board stretch x 30" x 3 Seated hamstring stretch x 30" x 3 L knee drives, 18" black box x 10 x 3 x 30" stretch at the beginning of each set Step downs (heel taps) on a half foam roll x 10 x 2 Mini squats x 3" x 10 x 2 Standing heel/toe raies x 10 x 2 L step ups, 4" box 10 x 2  02/09/23 Evaluation and patient education done    PATIENT EDUCATION:  Education details: Educated on the pathoanatomy of fracture. Educated on the goals and course of rehab. Person educated: Patient Education method: Explanation Education comprehension: verbalized understanding  HOME EXERCISE PROGRAM: Access Code: [email or web address] URL: [email or web address] Date: 02/24/2023 Prepared by: Seymour Bars  Exercises - Supine Quad Set  - 1-2 x daily - 2 sets - 10 reps - 3 seconds hold - Small Range Straight Leg Raise  - 1-2 x daily - 2 sets - 10 reps - Seated Long Arc Quad  - 1-2 x daily - 2 sets - 10 reps - Heel Toe Raises with Counter Support  - 1-2 x daily - 2 sets - 10 reps  ASSESSMENT:  CLINICAL IMPRESSION: Interventions today were geared towards quad strengthening and Gait Training. PT to notice decreased left quad activation. Progressed patient with quad sets, SAQs, then finally SLRS with minimal shakiness due to weakness/ fatigue. Tolerated all activities without worsening of symptoms. Demonstrated appropriate levels of fatigue.  Rest periods provided. Provided slight amount of cueing to ensure correct execution of activity with good carry-over. PT then demo'd proper 2 pt step through pattern using single point cane to minimize limping. To date, skilled PT is required to address the impairments and improve function. HEP adjusted to focus on open chain quad strength due to pain/ quad weakness.   EVAL: Patient is a 77 y.o. female who was seen today for physical therapy evaluation and treatment for (L) TIBIAL PLATEAU FRACTURE ORIF (L) TIBIAL PLATEAU FX. Patient's condition is further defined by difficulty with walking due to pain, weakness, and decreased soft tissue extensibility. Skilled PT is required to address the impairments and functional limitations listed below. Patient is at moderate risk for DVT based on the Well's Criteria. Patient was advised to see her MD about it. Also messaged referring MD via Epic about this concern. Spoke to patient in the morning of 02/10/23 via phone and patient reported that she will have [place] of her LE today.  OBJECTIVE IMPAIRMENTS: Abnormal gait, decreased activity tolerance, difficulty walking, decreased ROM, decreased strength, hypomobility, impaired flexibility, and pain.   ACTIVITY LIMITATIONS: carrying, lifting, bending, sitting, standing, squatting, and stairs  PARTICIPATION LIMITATIONS: meal prep, cleaning, laundry, driving, shopping, community activity, occupation, and yard work  PERSONAL FACTORS: Age are also affecting patient's functional outcome.   REHAB POTENTIAL: Good  CLINICAL DECISION MAKING: Stable/uncomplicated  EVALUATION COMPLEXITY: Low  GOALS: Goals reviewed with patient? Yes  SHORT TERM GOALS: Target date: 02/23/23 Pt will demonstrate indep in HEP to facilitate carry-over of skilled services and improve functional outcomes Goal status: INITIAL   LONG TERM GOALS: Target date: 03/09/23  Pt will decrease 5TSTS by at least 3 seconds in order to demonstrate  clinically significant improvement in LE strength   Baseline: 11.25 sec Goal status: INITIAL  2.  Pt will increase by at least 40 ft in order to demonstrate clinically significant improvement in community ambulation  Baseline: 449 ft Goal status: INITIAL  3.  Patient will demonstrate increase in knee flex ROM by 10 deg and knee ext by 5 deg to facilitate ease in ambulation  Baseline: see above Goal status: INITIAL  4.  Pt will demonstrate increase in LE strength to 4+/5 to facilitate ease and safety in ambulation Baseline: 3-/5 Goal status: INITIAL  5.  Pt will improve ABC scale score by 70% in order to demonstrate improved confidence with balance and safety during ambulation and other ADLs  Baseline: 1.3% Goal status: INITIAL  PLAN:  PT FREQUENCY: 2x/week  PT DURATION: 4 weeks  PLANNED INTERVENTIONS: 97164- PT Re-evaluation, 97110-Therapeutic exercises, 97530- Therapeutic activity, 97112- Neuromuscular re-education, 97535- Self Care, 46962- Manual therapy, 97116- Gait training, 97014- Electrical stimulation (unattended), Patient/Family education, Stair training, Cryotherapy, and Moist heat  PLAN FOR NEXT SESSION: Continue POC and may progress as tolerated with emphasis on LE strengthening and flexibility as well as balance training.   Seymour Bars PT, DPT  02/24/2023, 1:49 PM

## 2023-02-28 NOTE — Addendum Note (Signed)
Addended by: Christen Butter on: 02/28/2023 03:24 PM   Modules accepted: Orders

## 2023-02-28 NOTE — Telephone Encounter (Signed)
Spoke with the [name] and notified of response per Dr Maple Hudson  She verbalized understanding Order placed

## 2023-03-01 ENCOUNTER — Ambulatory Visit (HOSPITAL_COMMUNITY): Payer: Medicare HMO

## 2023-03-01 DIAGNOSIS — M79605 Pain in left leg: Secondary | ICD-10-CM

## 2023-03-01 DIAGNOSIS — M6281 Muscle weakness (generalized): Secondary | ICD-10-CM

## 2023-03-01 DIAGNOSIS — M25662 Stiffness of left knee, not elsewhere classified: Secondary | ICD-10-CM | POA: Diagnosis not present

## 2023-03-01 DIAGNOSIS — R262 Difficulty in walking, not elsewhere classified: Secondary | ICD-10-CM | POA: Diagnosis not present

## 2023-03-01 NOTE — Therapy (Addendum)
OUTPATIENT PHYSICAL THERAPY LOWER EXTREMITY TREATMENT   Patient Name: [name] MRN: [id number] DOB:07-01-1945, 77 y.o., female Today's Date: 03/01/2023  END OF SESSION:  PT End of Session - 03/01/23 1502     Visit Number 5    Number of Visits 8    Date for PT Re-Evaluation 03/09/23    Authorization Type [employer] (approved 8 visits)    Authorization Time Period 02/11/23 - 03/09/23    Authorization - Visit Number 4    Authorization - Number of Visits 8    PT Start Time 1350    PT Stop Time 1430    PT Time Calculation (min) 40 min    Activity Tolerance Patient tolerated treatment well    Behavior During Therapy [facility] for tasks assessed/performed             Past Medical History:  Diagnosis Date   Anxiety    Arthritis    Depression    DVT (deep venous thrombosis) (HCC) 08/02/2022   LLE 08/02/22   Essential hypertension    Eustachian tube dysfunction    Fibromyalgia    GERD (gastroesophageal reflux disease)    Hyperlipidemia    Hypothyroid    Mild carotid artery disease (HCC)    OSA (obstructive sleep apnea)    Osteopenia    Pneumonia    Stroke (HCC)    Type 2 diabetes mellitus (HCC)    Past Surgical History:  Procedure Laterality Date   ANKLE SURGERY     Anteriorvesicourethropexy     BUNIONECTOMY     COLONOSCOPY     NASAL SINUS SURGERY     ORIF TIBIA PLATEAU Left 07/30/2022   Procedure: OPEN REDUCTION INTERNAL FIXATION (ORIF) TIBIAL PLATEAU;  Surgeon: Roby Lofts, MD;  Location: [facility] OR;  Service: Orthopedics;  Laterality: Left;   ORIF TIBIA PLATEAU Left 09/02/2022   Procedure: REVISION FIXATION OF LEFT TIBIAL PLATEAU FRACTURE;  Surgeon: Roby Lofts, MD;  Location: [facility] OR;  Service: Orthopedics;  Laterality: Left;   PATELLA FRACTURE SURGERY  2009   Right   TONSILLECTOMY     TOTAL ABDOMINAL HYSTERECTOMY     WRIST FRACTURE SURGERY  2009   Patient Active Problem List   Diagnosis Date Noted   Closed fracture of left tibial plateau  09/02/2022   Left tibial fracture 07/29/2022   Dyspnea on exertion 09/15/2020   CVA (cerebral vascular accident) (HCC) 04/20/2019   Pain in left hip 10/05/2016   Pain in right hip 10/05/2016   Trochanteric bursitis, right hip 10/05/2016   Trochanteric bursitis, left hip 10/05/2016   Chronic insomnia 01/28/2011   Seasonal and perennial allergic rhinitis 05/28/2010   Lung nodule 05/28/2010   HYPERLIPIDEMIA 11/23/2009   G E R D 11/23/2009   EUSTACHIAN TUBE DYSFUNCTION 11/18/2009   HYPERTENSION 11/18/2009   Unspecified hypothyroidism 03/14/2007   Obstructive sleep apnea 03/14/2007   FIBROMYALGIA 03/14/2007    PCP: Geoffry Paradise, MD  REFERRING PROVIDER: Myrene Galas, MD  REFERRING DIAG: (L) FIBAL PLATEAU FRACTURE ORIF (L) FIBIAL PLANTEAU FX  THERAPY DIAG:  Difficulty in walking, not elsewhere classified  Pain in left leg  Muscle weakness (generalized)  Stiffness of left knee, not elsewhere classified  Rationale for Evaluation and Treatment: Rehabilitation  ONSET DATE: 07/30/22, 09/02/22  SUBJECTIVE:   SUBJECTIVE STATEMENT: Current L knee pain = 4/10. Patient reports that her leg is itchy. Denies recent falls. Patient states that she's not very compliant with her HEP  EVAL: Arrives to the clinic with  L knee and L lower leg pain down to the inside of the L ankle (see below). Patient states that the L knee feels that it will give way. Patient is concerned about having blood clots but has taken her baby aspirin this morning. Patient states that she is numb from the R knee to the bottom of her foot. Patient has been doing her HEP almost everyday but not all of the exercises. Denies recent falls since she was d/c from PT. Patient states that her issues came about a week after she was D/C from PT. Patient called her MD and requested her MD to refer her back to outpatient PT evaluation and management.  PERTINENT HISTORY: Per Initial Evaluation 12/01/22: [name] is an 77  y.o. female with past medical history of CVA, hypertension, hyperlipidemia, GERD, OSA. Patient sustained a fall on Jul 29, 2022, resulting in a left tibial plateau fracture. Patient underwent ORIF of left tibial plateau by Dr. Jena Gauss on 07/30/2022. She was placed in a hinged knee brace postoperatively and instructed to be nonweightbearing on the left lower extremity. Patient has been compliant with these weightbearing precautions. When patient was evaluated at her postop appointment on 07/31/2022, she was found to have some loss of fixation of the fracture thus she had a second surgery on 09/02/22.  Pt states that she had HH therapy that stopped about a week ago. PAIN:  Are you having pain? Yes: NPRS scale: 3/10 Pain location: L knee and L lower leg pain down to the inside of the L ankle Pain description: burning, stinging, aching Aggravating factors: walking around 45 minutes, standing around 30-40 minutes, night Relieving factors: Tylenol, laying with the hip in ER and knee flexed (as shown by the patient)  PRECAUTIONS: Fall  RED FLAGS: Bowel or bladder incontinence: No and Cauda equina syndrome: No   WEIGHT BEARING RESTRICTIONS: No  FALLS:  Has patient fallen in last 6 months? Yes. Number of falls 1  LIVING ENVIRONMENT: Lives with: lives with their family Lives in: House/apartment 2nd floor Stairs: Yes: Internal: 12-15 steps; can reach both Has following equipment at home: Quad cane small base, Environmental consultant - 2 wheeled, and Wheelchair (manual)  OCCUPATION: retired  PLOF: Independent and Independent with basic ADLs  PATIENT GOALS: "to walk without limping and not be afraid of falling"  NEXT MD VISIT: 1st week of December 2024  OBJECTIVE:  Note: Objective measures were completed at Evaluation unless otherwise noted.  DIAGNOSTIC FINDINGS:  09/23/22 CT OF THE LEFT KNEE WITHOUT CONTRAST   TECHNIQUE: Multidetector CT imaging of the left knee was performed according to the standard protocol.  Multiplanar CT image reconstructions were also generated.   RADIATION DOSE REDUCTION: This exam was performed according to the departmental dose-optimization program which includes automated exposure control, adjustment of the mA and/or kV according to patient size and/or use of iterative reconstruction technique.   COMPARISON:  07/29/2022   FINDINGS: Bones/Joint/Cartilage   Generalized osteopenia.   Healing comminuted medial and lateral tibial plateau fracture transfixed with a medial sideplate and multiple interlocking screws. 1.5 x 1.5 cm bony defect resulting from distraction of the bony fragments involving the articular surface of the medial tibial plateau. Persistent depressed posterolateral tibial plateau fracture with 6 mm of maximum depression involving an area of 20 x 14 mm of the posterolateral tibial plateau articular surface.   Healing nondisplaced fracture of the fibular neck.   No new fracture or dislocation. Small joint effusion. Moderate-severe osteoarthritis of the scratch them  moderate-severe joint space narrowing of the lateral femorotibial compartment.   Ligaments   Ligaments are suboptimally evaluated by CT.   Muscles and Tendons Muscles are normal. No muscle atrophy. No intramuscular fluid collection or hematoma. Quadriceps tendon and patellar tendon are intact.   Soft tissue No fluid collection or hematoma.  No soft tissue mass.   IMPRESSION: 1. Healing ununited comminuted medial and lateral tibial plateau fracture transfixed with a medial sideplate and multiple interlocking screws. 1.5 x 1.5 cm bony defect resulting from distraction of the bony fragments involving the articular surface of the medial tibial plateau. Persistent depressed posterolateral tibial plateau fracture with 6 mm of maximum depression involving an area of 20 x 14 mm of the posterolateral tibial plateau articular surface. 2. Healing nondisplaced fracture of the fibular  neck. 3. 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  PATIENT SURVEYS:  ABC scale 20/1600 = 1.3%  COGNITION: Overall cognitive status: Within functional limits for tasks assessed     SENSATION: Not tested  EDEMA: presents with slight to mild swelling on the L knee Calf girth: L 37 cm, R 35.5 cm  MUSCLE LENGTH: Hamstrings: moderate to severe restriction on L, moderate restriction on R Thomas test: mild to moderate restriction on B Gastrocnemius: mild to moderate restriction on B  POSTURE:  slightly flexed L knee (standing)  PALPATION: Grade 1 tenderness on L popliteal fossa Hypomobile on L patella on all glides  LOWER EXTREMITY ROM:  Active ROM Right eval Left eval  Hip flexion [facility]  Hip extension [facility]  Hip abduction [facility]  Hip adduction    Hip internal rotation    Hip external rotation    Knee flexion 115 100  Knee extension 0 (10)  Ankle dorsiflexion [facility] WFL  Ankle plantarflexion [facility] WFL  Ankle inversion    Ankle eversion     (Blank rows = not tested)  LOWER EXTREMITY MMT:  MMT Right eval Left eval  Hip flexion 4+ 4-  Hip extension 4+ 4-  Hip abduction 4+ 4-  Hip adduction    Hip internal rotation    Hip external rotation    Knee flexion 4 3-  Knee extension 4 3-  Ankle dorsiflexion 4+ 4+  Ankle plantarflexion 4+ 4+  Ankle inversion    Ankle eversion     (Blank rows = not tested)  LOWER EXTREMITY SPECIAL TESTS:  Hip special tests: Ely's test: positive   FUNCTIONAL TESTS:  5 times sit to stand: 11.25 sec 2 minute walk test: 449 ft with 5/10 pain on the L knee Well's criteria for DVT screening: moderate risk (all criteria scored as 0, except for previously documented DVT = 1)  GAIT: Distance walked: 449 ft Assistive device utilized: None Level of assistance: Complete Independence Comments: done during , antagic gait   TODAY'S TREATMENT:  DATE:  03/01/23 NuStep, seat 7, level 2, > 70 spm, 5' Gastrocnemius slant board stretch x 30" x 3 Seated hamstring stretch x 30" x 3 Standing heel/toe raies x 10 x 2 x 2 lbs Hip vectors x 10, RTB Standing L TKE, RTB x 3" x 10 x 2 Education on the importance of being compliant to HEP - patient gave excellent verbal understanding  02/24/23 Supine Manual therapy: soft tissue mobilization/ instrument-assisted soft tissue mobilization  to left lower extremity for swelling/ pain management   Left quad sets into half foam + PT manual cues 10x3-5"H   Left SAQS x10 with 2# then 3#    Left partial SLRs off of foam roll 2x10  Gait Training with single point cane, 2 pt step through  02/22/23 Supine  Left quad sets into half foam + PT manual cues 10x3-5"H   Left SAQS x10 with 2# then 3#    Left partial SLRs off of foam roll 2x10   Leg press with 30# 2x10   02/11/23 NuStep, seat 7, level 1, > 70 spm, 5' Gastrocnemius slant board stretch x 30" x 3 Seated hamstring stretch x 30" x 3 L knee drives, 18" black box x 10 x 3 x 30" stretch at the beginning of each set Step downs (heel taps) on a half foam roll x 10 x 2 Mini squats x 3" x 10 x 2 Standing heel/toe raies x 10 x 2 L step ups, 4" box 10 x 2  02/09/23 Evaluation and patient education done    PATIENT EDUCATION:  Education details: Educated on the pathoanatomy of fracture. Educated on the goals and course of rehab. Person educated: Patient Education method: Explanation Education comprehension: verbalized understanding  HOME EXERCISE PROGRAM: Access Code: [email or web address] URL: [email or web address] 03/01/23 - Standing 3-Way Leg Reach with Resistance at Ankles and Counter Support  - 1 x daily - 5 x weekly - 2 sets - 10 reps  Date: 02/24/2023 Prepared by: Seymour Bars  Exercises - Supine Quad Set  - 1-2 x daily - 2 sets - 10 reps - 3 seconds hold - Small Range  Straight Leg Raise  - 1-2 x daily - 2 sets - 10 reps - Seated Long Arc Quad  - 1-2 x daily - 2 sets - 10 reps - Heel Toe Raises with Counter Support  - 1-2 x daily - 2 sets - 10 reps  ASSESSMENT:  CLINICAL IMPRESSION: Interventions today were geared towards LE strengthening and LE flexibility. Tolerated all activities without worsening of symptoms. Demonstrated appropriate levels of fatigue. Rest periods provided. Provided slight amount of cueing to ensure correct execution of activity with good carry-over.  To date, skilled PT is required to address the impairments and improve function.    EVAL: Patient is a 77 y.o. female who was seen today for physical therapy evaluation and treatment for (L) TIBIAL PLATEAU FRACTURE ORIF (L) TIBIAL PLATEAU FX. Patient's condition is further defined by difficulty with walking due to pain, weakness, and decreased soft tissue extensibility. Skilled PT is required to address the impairments and functional limitations listed below. Patient is at moderate risk for DVT based on the Well's Criteria. Patient was advised to see her MD about it. Also messaged referring MD via Epic about this concern. Spoke to patient in the morning of 02/10/23 via phone and patient reported that she will have [place] of her LE today.  OBJECTIVE IMPAIRMENTS: Abnormal gait, decreased activity tolerance, difficulty walking, decreased ROM, decreased strength, hypomobility, impaired flexibility,  and pain.   ACTIVITY LIMITATIONS: carrying, lifting, bending, sitting, standing, squatting, and stairs  PARTICIPATION LIMITATIONS: meal prep, cleaning, laundry, driving, shopping, community activity, occupation, and yard work  PERSONAL FACTORS: Age are also affecting patient's functional outcome.   REHAB POTENTIAL: Good  CLINICAL DECISION MAKING: Stable/uncomplicated  EVALUATION COMPLEXITY: Low   GOALS: Goals reviewed with patient? Yes  SHORT TERM GOALS: Target date: 02/23/23 Pt will demonstrate  indep in HEP to facilitate carry-over of skilled services and improve functional outcomes Goal status: INITIAL   LONG TERM GOALS: Target date: 03/09/23  Pt will decrease 5TSTS by at least 3 seconds in order to demonstrate clinically significant improvement in LE strength   Baseline: 11.25 sec Goal status: INITIAL  2.  Pt will increase by at least 40 ft in order to demonstrate clinically significant improvement in community ambulation  Baseline: 449 ft Goal status: INITIAL  3.  Patient will demonstrate increase in knee flex ROM by 10 deg and knee ext by 5 deg to facilitate ease in ambulation  Baseline: see above Goal status: INITIAL  4.  Pt will demonstrate increase in LE strength to 4+/5 to facilitate ease and safety in ambulation Baseline: 3-/5 Goal status: INITIAL  5.  Pt will improve ABC scale score by 70% in order to demonstrate improved confidence with balance and safety during ambulation and other ADLs  Baseline: 1.3% Goal status: INITIAL  PLAN:  PT FREQUENCY: 2x/week  PT DURATION: 4 weeks  PLANNED INTERVENTIONS: 97164- PT Re-evaluation, 97110-Therapeutic exercises, 97530- Therapeutic activity, 97112- Neuromuscular re-education, 97535- Self Care, 95284- Manual therapy, 97116- Gait training, 97014- Electrical stimulation (unattended), Patient/Family education, Stair training, Cryotherapy, and Moist heat  PLAN FOR NEXT SESSION: Continue POC and may progress as tolerated with emphasis on LE strengthening and flexibility as well as balance training.   Tish Frederickson. Tiffinie Caillier, PT, DPT, OCS Board-Certified Clinical Specialist in Orthopedic PT PT Compact Privilege # ([place]): [phone]  03/01/2023, 3:03 PM

## 2023-03-02 NOTE — Telephone Encounter (Signed)
DME needs [place] to do sleep study for documentation, since we no longer have original from 2008 Plan- HST

## 2023-03-03 ENCOUNTER — Ambulatory Visit (HOSPITAL_COMMUNITY): Payer: Medicare HMO

## 2023-03-03 DIAGNOSIS — M6281 Muscle weakness (generalized): Secondary | ICD-10-CM | POA: Diagnosis not present

## 2023-03-03 DIAGNOSIS — M79605 Pain in left leg: Secondary | ICD-10-CM | POA: Diagnosis not present

## 2023-03-03 DIAGNOSIS — M25662 Stiffness of left knee, not elsewhere classified: Secondary | ICD-10-CM | POA: Diagnosis not present

## 2023-03-03 DIAGNOSIS — R262 Difficulty in walking, not elsewhere classified: Secondary | ICD-10-CM

## 2023-03-03 NOTE — Therapy (Signed)
OUTPATIENT PHYSICAL THERAPY LOWER EXTREMITY TREATMENT   Patient Name: [name] MRN: [id number] DOB:[birth year], 77 y.o., female Today's Date: 03/03/2023  END OF SESSION:  PT End of Session - 03/03/23 1306     Visit Number 6    Number of Visits 8    Date for PT Re-Evaluation 03/09/23    Authorization Type [employer] (approved 8 visits)    Authorization Time Period 02/11/23 - 03/09/23    Authorization - Visit Number 5    Authorization - Number of Visits 8    PT Start Time 1305    PT Stop Time 1345    PT Time Calculation (min) 40 min    Activity Tolerance Patient tolerated treatment well    Behavior During Therapy [facility] for tasks assessed/performed            Past Medical History:  Diagnosis Date   Anxiety    Arthritis    Depression    DVT (deep venous thrombosis) (HCC) 08/02/2022   LLE 08/02/22   Essential hypertension    Eustachian tube dysfunction    Fibromyalgia    GERD (gastroesophageal reflux disease)    Hyperlipidemia    Hypothyroid    Mild carotid artery disease (HCC)    OSA (obstructive sleep apnea)    Osteopenia    Pneumonia    Stroke (HCC)    Type 2 diabetes mellitus (HCC)    Past Surgical History:  Procedure Laterality Date   ANKLE SURGERY     Anteriorvesicourethropexy     BUNIONECTOMY     COLONOSCOPY     NASAL SINUS SURGERY     ORIF TIBIA PLATEAU Left 07/30/2022   Procedure: OPEN REDUCTION INTERNAL FIXATION (ORIF) TIBIAL PLATEAU;  Surgeon: Roby Lofts, MD;  Location: [facility] OR;  Service: Orthopedics;  Laterality: Left;   ORIF TIBIA PLATEAU Left 09/02/2022   Procedure: REVISION FIXATION OF LEFT TIBIAL PLATEAU FRACTURE;  Surgeon: Roby Lofts, MD;  Location: [facility] OR;  Service: Orthopedics;  Laterality: Left;   PATELLA FRACTURE SURGERY  2009   Right   TONSILLECTOMY     TOTAL ABDOMINAL HYSTERECTOMY     WRIST FRACTURE SURGERY  2009   Patient Active Problem List   Diagnosis Date Noted   Closed fracture of left tibial plateau 09/02/2022    Left tibial fracture 07/29/2022   Dyspnea on exertion 09/15/2020   CVA (cerebral vascular accident) (HCC) 04/20/2019   Pain in left hip 10/05/2016   Pain in right hip 10/05/2016   Trochanteric bursitis, right hip 10/05/2016   Trochanteric bursitis, left hip 10/05/2016   Chronic insomnia 01/28/2011   Seasonal and perennial allergic rhinitis 05/28/2010   Lung nodule 05/28/2010   HYPERLIPIDEMIA 11/23/2009   G E R D 11/23/2009   EUSTACHIAN TUBE DYSFUNCTION 11/18/2009   HYPERTENSION 11/18/2009   Unspecified hypothyroidism 03/14/2007   Obstructive sleep apnea 03/14/2007   FIBROMYALGIA 03/14/2007    PCP: Geoffry Paradise, MD  REFERRING PROVIDER: Myrene Galas, MD  REFERRING DIAG: (L) FIBAL PLATEAU FRACTURE ORIF (L) FIBIAL PLANTEAU FX  THERAPY DIAG:  Difficulty in walking, not elsewhere classified  Pain in left leg  Muscle weakness (generalized)  Stiffness of left knee, not elsewhere classified  Rationale for Evaluation and Treatment: Rehabilitation  ONSET DATE: 07/30/22, 09/02/22  SUBJECTIVE:   SUBJECTIVE STATEMENT: Current L knee pain = 3/10. Patient states  that she's compliant now with her HEP.  EVAL: Arrives to the clinic with L knee and L lower leg pain down to the inside  of the L ankle (see below). Patient states that the L knee feels that it will give way. Patient is concerned about having blood clots but has taken her baby aspirin this morning. Patient states that she is numb from the R knee to the bottom of her foot. Patient has been doing her HEP almost everyday but not all of the exercises. Denies recent falls since she was d/c from PT. Patient states that her issues came about a week after she was D/C from PT. Patient called her MD and requested her MD to refer her back to outpatient PT evaluation and management.  PERTINENT HISTORY: Per Initial Evaluation 12/01/22: [name] is an 77 y.o. female with past medical history of CVA, hypertension,  hyperlipidemia, GERD, OSA. Patient sustained a fall on Jul 29, 2022, resulting in a left tibial plateau fracture. Patient underwent ORIF of left tibial plateau by Dr. Jena Gauss on 07/30/2022. She was placed in a hinged knee brace postoperatively and instructed to be nonweightbearing on the left lower extremity. Patient has been compliant with these weightbearing precautions. When patient was evaluated at her postop appointment on 07/31/2022, she was found to have some loss of fixation of the fracture thus she had a second surgery on 09/02/22.  Pt states that she had HH therapy that stopped about a week ago. PAIN:  Are you having pain? Yes: NPRS scale: 3/10 Pain location: L knee and L lower leg pain down to the inside of the L ankle Pain description: burning, stinging, aching Aggravating factors: walking around 45 minutes, standing around 30-40 minutes, night Relieving factors: Tylenol, laying with the hip in ER and knee flexed (as shown by the patient)  PRECAUTIONS: Fall  RED FLAGS: Bowel or bladder incontinence: No and Cauda equina syndrome: No   WEIGHT BEARING RESTRICTIONS: No  FALLS:  Has patient fallen in last 6 months? Yes. Number of falls 1  LIVING ENVIRONMENT: Lives with: lives with their family Lives in: House/apartment 2nd floor Stairs: Yes: Internal: 12-15 steps; can reach both Has following equipment at home: Quad cane small base, Environmental consultant - 2 wheeled, and Wheelchair (manual)  OCCUPATION: retired  PLOF: Independent and Independent with basic ADLs  PATIENT GOALS: "to walk without limping and not be afraid of falling"  NEXT MD VISIT: 1st week of December 2024  OBJECTIVE:  Note: Objective measures were completed at Evaluation unless otherwise noted.  DIAGNOSTIC FINDINGS:  09/23/22 CT OF THE LEFT KNEE WITHOUT CONTRAST   TECHNIQUE: Multidetector CT imaging of the left knee was performed according to the standard protocol. Multiplanar CT image reconstructions were also  generated.   RADIATION DOSE REDUCTION: This exam was performed according to the departmental dose-optimization program which includes automated exposure control, adjustment of the mA and/or kV according to patient size and/or use of iterative reconstruction technique.   COMPARISON:  07/29/2022   FINDINGS: Bones/Joint/Cartilage   Generalized osteopenia.   Healing comminuted medial and lateral tibial plateau fracture transfixed with a medial sideplate and multiple interlocking screws. 1.5 x 1.5 cm bony defect resulting from distraction of the bony fragments involving the articular surface of the medial tibial plateau. Persistent depressed posterolateral tibial plateau fracture with 6 mm of maximum depression involving an area of 20 x 14 mm of the posterolateral tibial plateau articular surface.   Healing nondisplaced fracture of the fibular neck.   No new fracture or dislocation. Small joint effusion. Moderate-severe osteoarthritis of the scratch them moderate-severe joint space narrowing of the lateral femorotibial compartment.  Ligaments   Ligaments are suboptimally evaluated by CT.   Muscles and Tendons Muscles are normal. No muscle atrophy. No intramuscular fluid collection or hematoma. Quadriceps tendon and patellar tendon are intact.   Soft tissue No fluid collection or hematoma.  No soft tissue mass.   IMPRESSION: 1. Healing ununited comminuted medial and lateral tibial plateau fracture transfixed with a medial sideplate and multiple interlocking screws. 1.5 x 1.5 cm bony defect resulting from distraction of the bony fragments involving the articular surface of the medial tibial plateau. Persistent depressed posterolateral tibial plateau fracture with 6 mm of maximum depression involving an area of 20 x 14 mm of the posterolateral tibial plateau articular surface. 2. Healing nondisplaced fracture of the fibular neck. 3. 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  PATIENT SURVEYS:  ABC scale 20/1600 = 1.3%  COGNITION: Overall cognitive status: Within functional limits for tasks assessed     SENSATION: Not tested  EDEMA: presents with slight to mild swelling on the L knee Calf girth: L 37 cm, R 35.5 cm  MUSCLE LENGTH: Hamstrings: moderate to severe restriction on L, moderate restriction on R Thomas test: mild to moderate restriction on B Gastrocnemius: mild to moderate restriction on B  POSTURE:  slightly flexed L knee (standing)  PALPATION: Grade 1 tenderness on L popliteal fossa Hypomobile on L patella on all glides  LOWER EXTREMITY ROM:  Active ROM Right eval Left eval  Hip flexion [facility]  Hip extension [facility]  Hip abduction [facility]  Hip adduction    Hip internal rotation    Hip external rotation    Knee flexion 115 100  Knee extension 0 (10)  Ankle dorsiflexion [facility] WFL  Ankle plantarflexion [facility] WFL  Ankle inversion    Ankle eversion     (Blank rows = not tested)  LOWER EXTREMITY MMT:  MMT Right eval Left eval  Hip flexion 4+ 4-  Hip extension 4+ 4-  Hip abduction 4+ 4-  Hip adduction    Hip internal rotation    Hip external rotation    Knee flexion 4 3-  Knee extension 4 3-  Ankle dorsiflexion 4+ 4+  Ankle plantarflexion 4+ 4+  Ankle inversion    Ankle eversion     (Blank rows = not tested)  LOWER EXTREMITY SPECIAL TESTS:  Hip special tests: Ely's test: positive   FUNCTIONAL TESTS:  5 times sit to stand: 11.25 sec 2 minute walk test: 449 ft with 5/10 pain on the L knee Well's criteria for DVT screening: moderate risk (all criteria scored as 0, except for previously documented DVT = 1)  GAIT: Distance walked: 449 ft Assistive device utilized: None Level of assistance: Complete Independence Comments: done during , antagic gait   TODAY'S TREATMENT:  DATE:  03/03/23 Recumbent bike, seat 8, level 1, 5' Gastrocnemius slant board stretch x 30" x 3 Seated hamstring stretch x 30" x 3 Standing heel/toe raies x 10 x 2 x 2 lbs L knee drives, on 2-step stairs x 10 x 3 x 30" stretch at the beginning of each set Body craft Standing L TKE, 2 plates x 3" x 10 x 2 Walking backwards/forwards, sideways, 2 plates x 5 rounds each L Step ups, 7" step x 10 x 2 L side step ups, 4" box x 10 x 2 Cybex hamstring curls x 10 x 2 x 3 plates  16/10/96 NuStep, seat 7, level 2, > 70 spm, 5' Gastrocnemius slant board stretch x 30" x 3 Seated hamstring stretch x 30" x 3 Standing heel/toe raies x 10 x 2 x 2 lbs Hip vectors x 10, RTB Standing L TKE, RTB x 3" x 10 x 2 Education on the importance of being compliant to HEP - patient gave excellent verbal understanding  02/24/23 Supine Manual therapy: soft tissue mobilization/ instrument-assisted soft tissue mobilization  to left lower extremity for swelling/ pain management   Left quad sets into half foam + PT manual cues 10x3-5"H   Left SAQS x10 with 2# then 3#    Left partial SLRs off of foam roll 2x10  Gait Training with single point cane, 2 pt step through  02/22/23 Supine  Left quad sets into half foam + PT manual cues 10x3-5"H   Left SAQS x10 with 2# then 3#    Left partial SLRs off of foam roll 2x10   Leg press with 30# 2x10   02/11/23 NuStep, seat 7, level 1, > 70 spm, 5' Gastrocnemius slant board stretch x 30" x 3 Seated hamstring stretch x 30" x 3 L knee drives, 18" black box x 10 x 3 x 30" stretch at the beginning of each set Step downs (heel taps) on a half foam roll x 10 x 2 Mini squats x 3" x 10 x 2 Standing heel/toe raies x 10 x 2 L step ups, 4" box 10 x 2  02/09/23 Evaluation and patient education done    PATIENT EDUCATION:  Education details: Educated on the pathoanatomy of fracture. Educated on the goals and course of rehab. Person educated: Patient Education  method: Explanation Education comprehension: verbalized understanding  HOME EXERCISE PROGRAM: Access Code: [email or web address] URL: [email or web address] 03/01/23 - Standing 3-Way Leg Reach with Resistance at Ankles and Counter Support  - 1 x daily - 5 x weekly - 2 sets - 10 reps  Date: 02/24/2023 Prepared by: Seymour Bars  Exercises - Supine Quad Set  - 1-2 x daily - 2 sets - 10 reps - 3 seconds hold - Small Range Straight Leg Raise  - 1-2 x daily - 2 sets - 10 reps - Seated Long Arc Quad  - 1-2 x daily - 2 sets - 10 reps - Heel Toe Raises with Counter Support  - 1-2 x daily - 2 sets - 10 reps  ASSESSMENT:  CLINICAL IMPRESSION: Interventions today were geared towards LE strengthening and LE flexibility. Tolerated all activities without worsening of symptoms except when doing side step up where patient reported of 2/10 pain on the L knee. Demonstrated appropriate levels of fatigue. Rest periods provided. Provided slight amount of cueing to ensure correct execution of activity with good carry-over.  To date, skilled PT is required to address the impairments and improve function.    EVAL: Patient is a 77 y.o. female  who was seen today for physical therapy evaluation and treatment for (L) TIBIAL PLATEAU FRACTURE ORIF (L) TIBIAL PLATEAU FX. Patient's condition is further defined by difficulty with walking due to pain, weakness, and decreased soft tissue extensibility. Skilled PT is required to address the impairments and functional limitations listed below. Patient is at moderate risk for DVT based on the Well's Criteria. Patient was advised to see her MD about it. Also messaged referring MD via Epic about this concern. Spoke to patient in the morning of 02/10/23 via phone and patient reported that she will have [place] of her LE today.  OBJECTIVE IMPAIRMENTS: Abnormal gait, decreased activity tolerance, difficulty walking, decreased ROM, decreased strength, hypomobility, impaired flexibility,  and pain.   ACTIVITY LIMITATIONS: carrying, lifting, bending, sitting, standing, squatting, and stairs  PARTICIPATION LIMITATIONS: meal prep, cleaning, laundry, driving, shopping, community activity, occupation, and yard work  PERSONAL FACTORS: Age are also affecting patient's functional outcome.   REHAB POTENTIAL: Good  CLINICAL DECISION MAKING: Stable/uncomplicated  EVALUATION COMPLEXITY: Low   GOALS: Goals reviewed with patient? Yes  SHORT TERM GOALS: Target date: 02/23/23 Pt will demonstrate indep in HEP to facilitate carry-over of skilled services and improve functional outcomes Goal status: INITIAL   LONG TERM GOALS: Target date: 03/09/23  Pt will decrease 5TSTS by at least 3 seconds in order to demonstrate clinically significant improvement in LE strength   Baseline: 11.25 sec Goal status: INITIAL  2.  Pt will increase by at least 40 ft in order to demonstrate clinically significant improvement in community ambulation  Baseline: 449 ft Goal status: INITIAL  3.  Patient will demonstrate increase in knee flex ROM by 10 deg and knee ext by 5 deg to facilitate ease in ambulation  Baseline: see above Goal status: INITIAL  4.  Pt will demonstrate increase in LE strength to 4+/5 to facilitate ease and safety in ambulation Baseline: 3-/5 Goal status: INITIAL  5.  Pt will improve ABC scale score by 70% in order to demonstrate improved confidence with balance and safety during ambulation and other ADLs  Baseline: 1.3% Goal status: INITIAL  PLAN:  PT FREQUENCY: 2x/week  PT DURATION: 4 weeks  PLANNED INTERVENTIONS: 97164- PT Re-evaluation, 97110-Therapeutic exercises, 97530- Therapeutic activity, 97112- Neuromuscular re-education, 97535- Self Care, 21308- Manual therapy, 97116- Gait training, 97014- Electrical stimulation (unattended), Patient/Family education, Stair training, Cryotherapy, and Moist heat  PLAN FOR NEXT SESSION: Continue POC and may progress as  tolerated with emphasis on LE strengthening and flexibility as well as balance training.   Tish Frederickson. Jahaad Penado, PT, DPT, OCS Board-Certified Clinical Specialist in Orthopedic PT PT Compact Privilege # ([place]): [phone]  03/03/2023, 1:07 PM

## 2023-03-08 ENCOUNTER — Ambulatory Visit (HOSPITAL_COMMUNITY): Payer: Medicare HMO

## 2023-03-08 DIAGNOSIS — M79605 Pain in left leg: Secondary | ICD-10-CM | POA: Diagnosis not present

## 2023-03-08 DIAGNOSIS — R262 Difficulty in walking, not elsewhere classified: Secondary | ICD-10-CM | POA: Diagnosis not present

## 2023-03-08 DIAGNOSIS — G4733 Obstructive sleep apnea (adult) (pediatric): Secondary | ICD-10-CM

## 2023-03-08 DIAGNOSIS — M6281 Muscle weakness (generalized): Secondary | ICD-10-CM | POA: Diagnosis not present

## 2023-03-08 DIAGNOSIS — G4730 Sleep apnea, unspecified: Secondary | ICD-10-CM | POA: Diagnosis not present

## 2023-03-08 DIAGNOSIS — M25662 Stiffness of left knee, not elsewhere classified: Secondary | ICD-10-CM | POA: Diagnosis not present

## 2023-03-08 NOTE — Therapy (Signed)
OUTPATIENT PHYSICAL THERAPY LOWER EXTREMITY TREATMENT   Patient Name: Calisa Dixon Laban MRN: [id number] DOB:14-Apr-1945, 77 y.o., female Today's Date: 03/08/2023  END OF SESSION:   Past Medical History:  Diagnosis Date   Anxiety    Arthritis    Depression    DVT (deep venous thrombosis) (HCC) 08/02/2022   LLE 08/02/22   Essential hypertension    Eustachian tube dysfunction    Fibromyalgia    GERD (gastroesophageal reflux disease)    Hyperlipidemia    Hypothyroid    Mild carotid artery disease (HCC)    OSA (obstructive sleep apnea)    Osteopenia    Pneumonia    Stroke (HCC)    Type 2 diabetes mellitus (HCC)    Past Surgical History:  Procedure Laterality Date   ANKLE SURGERY     Anteriorvesicourethropexy     BUNIONECTOMY     COLONOSCOPY     NASAL SINUS SURGERY     ORIF TIBIA PLATEAU Left 07/30/2022   Procedure: OPEN REDUCTION INTERNAL FIXATION (ORIF) TIBIAL PLATEAU;  Surgeon: Roby Lofts, MD;  Location: [facility] OR;  Service: Orthopedics;  Laterality: Left;   ORIF TIBIA PLATEAU Left 09/02/2022   Procedure: REVISION FIXATION OF LEFT TIBIAL PLATEAU FRACTURE;  Surgeon: Roby Lofts, MD;  Location: [facility] OR;  Service: Orthopedics;  Laterality: Left;   PATELLA FRACTURE SURGERY  2009   Right   TONSILLECTOMY     TOTAL ABDOMINAL HYSTERECTOMY     WRIST FRACTURE SURGERY  2009   Patient Active Problem List   Diagnosis Date Noted   Closed fracture of left tibial plateau 09/02/2022   Left tibial fracture 07/29/2022   Dyspnea on exertion 09/15/2020   CVA (cerebral vascular accident) (HCC) 04/20/2019   Pain in left hip 10/05/2016   Pain in right hip 10/05/2016   Trochanteric bursitis, right hip 10/05/2016   Trochanteric bursitis, left hip 10/05/2016   Chronic insomnia 01/28/2011   Seasonal and perennial allergic rhinitis 05/28/2010   Lung nodule 05/28/2010   HYPERLIPIDEMIA 11/23/2009   G E R D 11/23/2009   EUSTACHIAN TUBE DYSFUNCTION 11/18/2009   HYPERTENSION 11/18/2009    Unspecified hypothyroidism 03/14/2007   Obstructive sleep apnea 03/14/2007   FIBROMYALGIA 03/14/2007    PCP: Geoffry Paradise, MD  REFERRING PROVIDER: Myrene Galas, MD  REFERRING DIAG: (L) FIBAL PLATEAU FRACTURE ORIF (L) FIBIAL PLANTEAU FX  THERAPY DIAG:  No diagnosis found.  Rationale for Evaluation and Treatment: Rehabilitation  ONSET DATE: 07/30/22, 09/02/22  SUBJECTIVE:   SUBJECTIVE STATEMENT: Knee is hurting more today because her L knee gave out this morning. Patient did not fall however. Current knee pain = 6/10.   EVAL: Arrives to the clinic with L knee and L lower leg pain down to the inside of the L ankle (see below). Patient states that the L knee feels that it will give way. Patient is concerned about having blood clots but has taken her baby aspirin this morning. Patient states that she is numb from the R knee to the bottom of her foot. Patient has been doing her HEP almost everyday but not all of the exercises. Denies recent falls since she was d/c from PT. Patient states that her issues came about a week after she was D/C from PT. Patient called her MD and requested her MD to refer her back to outpatient PT evaluation and management.  PERTINENT HISTORY: Per Initial Evaluation 12/01/22: Yashira Kuhrt Kraemer is an 77 y.o. female with past medical history of CVA, hypertension, hyperlipidemia, GERD,  OSA. Patient sustained a fall on Jul 29, 2022, resulting in a left tibial plateau fracture. Patient underwent ORIF of left tibial plateau by Dr. Jena Gauss on 07/30/2022. She was placed in a hinged knee brace postoperatively and instructed to be nonweightbearing on the left lower extremity. Patient has been compliant with these weightbearing precautions. When patient was evaluated at her postop appointment on 07/31/2022, she was found to have some loss of fixation of the fracture thus she had a second surgery on 09/02/22.  Pt states that she had HH therapy that stopped about a week ago. PAIN:   Are you having pain? Yes: NPRS scale: 3/10 Pain location: L knee and L lower leg pain down to the inside of the L ankle Pain description: burning, stinging, aching Aggravating factors: walking around 45 minutes, standing around 30-40 minutes, night Relieving factors: Tylenol, laying with the hip in ER and knee flexed (as shown by the patient)  PRECAUTIONS: Fall  RED FLAGS: Bowel or bladder incontinence: No and Cauda equina syndrome: No   WEIGHT BEARING RESTRICTIONS: No  FALLS:  Has patient fallen in last 6 months? Yes. Number of falls 1  LIVING ENVIRONMENT: Lives with: lives with their family Lives in: House/apartment 2nd floor Stairs: Yes: Internal: 12-15 steps; can reach both Has following equipment at home: Quad cane small base, Environmental consultant - 2 wheeled, and Wheelchair (manual)  OCCUPATION: retired  PLOF: Independent and Independent with basic ADLs  PATIENT GOALS: "to walk without limping and not be afraid of falling"  NEXT MD VISIT: 1st week of December 2024  OBJECTIVE:  Note: Objective measures were completed at Evaluation unless otherwise noted.  DIAGNOSTIC FINDINGS:  09/23/22 CT OF THE LEFT KNEE WITHOUT CONTRAST   TECHNIQUE: Multidetector CT imaging of the left knee was performed according to the standard protocol. Multiplanar CT image reconstructions were also generated.   RADIATION DOSE REDUCTION: This exam was performed according to the departmental dose-optimization program which includes automated exposure control, adjustment of the mA and/or kV according to patient size and/or use of iterative reconstruction technique.   COMPARISON:  07/29/2022   FINDINGS: Bones/Joint/Cartilage   Generalized osteopenia.   Healing comminuted medial and lateral tibial plateau fracture transfixed with a medial sideplate and multiple interlocking screws. 1.5 x 1.5 cm bony defect resulting from distraction of the bony fragments involving the articular surface of the medial  tibial plateau. Persistent depressed posterolateral tibial plateau fracture with 6 mm of maximum depression involving an area of 20 x 14 mm of the posterolateral tibial plateau articular surface.   Healing nondisplaced fracture of the fibular neck.   No new fracture or dislocation. Small joint effusion. Moderate-severe osteoarthritis of the scratch them moderate-severe joint space narrowing of the lateral femorotibial compartment.   Ligaments   Ligaments are suboptimally evaluated by CT.   Muscles and Tendons Muscles are normal. No muscle atrophy. No intramuscular fluid collection or hematoma. Quadriceps tendon and patellar tendon are intact.   Soft tissue No fluid collection or hematoma.  No soft tissue mass.   IMPRESSION: 1. Healing ununited comminuted medial and lateral tibial plateau fracture transfixed with a medial sideplate and multiple interlocking screws. 1.5 x 1.5 cm bony defect resulting from distraction of the bony fragments involving the articular surface of the medial tibial plateau. Persistent depressed posterolateral tibial plateau fracture with 6 mm of maximum depression involving an area of 20 x 14 mm of the posterolateral tibial plateau articular surface. 2. Healing nondisplaced fracture of the fibular neck. 3. Moderate-severe osteoarthritis  of the lateral femorotibial compartment.  PATIENT SURVEYS:  ABC scale 20/1600 = 1.3%  COGNITION: Overall cognitive status: Within functional limits for tasks assessed     SENSATION: Not tested  EDEMA: presents with slight to mild swelling on the L knee Calf girth: L 37 cm, R 35.5 cm  MUSCLE LENGTH: Hamstrings: moderate to severe restriction on L, moderate restriction on R Thomas test: mild to moderate restriction on B Gastrocnemius: mild to moderate restriction on B  POSTURE:  slightly flexed L knee (standing)  PALPATION: Grade 1 tenderness on L popliteal fossa Hypomobile on L patella on all  glides  LOWER EXTREMITY ROM:  Active ROM Right eval Left eval  Hip flexion [facility]  Hip extension [facility]  Hip abduction [facility]  Hip adduction    Hip internal rotation    Hip external rotation    Knee flexion 115 100  Knee extension 0 (10)  Ankle dorsiflexion [facility] WFL  Ankle plantarflexion [facility] WFL  Ankle inversion    Ankle eversion     (Blank rows = not tested)  LOWER EXTREMITY MMT:  MMT Right eval Left eval  Hip flexion 4+ 4-  Hip extension 4+ 4-  Hip abduction 4+ 4-  Hip adduction    Hip internal rotation    Hip external rotation    Knee flexion 4 3-  Knee extension 4 3-  Ankle dorsiflexion 4+ 4+  Ankle plantarflexion 4+ 4+  Ankle inversion    Ankle eversion     (Blank rows = not tested)  LOWER EXTREMITY SPECIAL TESTS:  Hip special tests: Ely's test: positive   FUNCTIONAL TESTS:  5 times sit to stand: 11.25 sec 2 minute walk test: 449 ft with 5/10 pain on the L knee Well's criteria for DVT screening: moderate risk (all criteria scored as 0, except for previously documented DVT = 1)  GAIT: Distance walked: 449 ft Assistive device utilized: None Level of assistance: Complete Independence Comments: done during , antagic gait   TODAY'S TREATMENT:                                                                                                                              DATE:  03/08/23 Recumbent bike, seat 8, level 1, 5' Supine heel slides x 15 x 2 Gastrocnemius slant board stretch x 30" x 3 Standing heel/toe raies x 10 x 2 x 2 lbs  03/03/23 Recumbent bike, seat 8, level 1, 5' Gastrocnemius slant board stretch x 30" x 3 Seated hamstring stretch x 30" x 3 Standing heel/toe raies x 10 x 2 x 2 lbs L knee drives, on 2-step stairs x 10 x 3 x 30" stretch at the beginning of each set Body craft Standing L TKE, 2 plates x 3" x 10 x 2 Walking backwards/forwards, sideways, 2 plates x 5 rounds each L Step ups, 7" step x 10 x 2 L side step ups, 4" box x 10  x  2 Cybex hamstring curls x 10 x 2 x 3 plates  40/98/11 NuStep, seat 7, level 2, > 70 spm, 5' Gastrocnemius slant board stretch x 30" x 3 Seated hamstring stretch x 30" x 3 Standing heel/toe raies x 10 x 2 x 2 lbs Hip vectors x 10, RTB Standing L TKE, RTB x 3" x 10 x 2 Education on the importance of being compliant to HEP - patient gave excellent verbal understanding  02/24/23 Supine Manual therapy: soft tissue mobilization/ instrument-assisted soft tissue mobilization  to left lower extremity for swelling/ pain management   Left quad sets into half foam + PT manual cues 10x3-5"H   Left SAQS x10 with 2# then 3#    Left partial SLRs off of foam roll 2x10  Gait Training with single point cane, 2 pt step through  02/22/23 Supine  Left quad sets into half foam + PT manual cues 10x3-5"H   Left SAQS x10 with 2# then 3#    Left partial SLRs off of foam roll 2x10   Leg press with 30# 2x10   02/11/23 NuStep, seat 7, level 1, > 70 spm, 5' Gastrocnemius slant board stretch x 30" x 3 Seated hamstring stretch x 30" x 3 L knee drives, 18" black box x 10 x 3 x 30" stretch at the beginning of each set Step downs (heel taps) on a half foam roll x 10 x 2 Mini squats x 3" x 10 x 2 Standing heel/toe raies x 10 x 2 L step ups, 4" box 10 x 2  02/09/23 Evaluation and patient education done    PATIENT EDUCATION:  Education details: Educated on the pathoanatomy of fracture. Educated on the goals and course of rehab. Person educated: Patient Education method: Explanation Education comprehension: verbalized understanding  HOME EXERCISE PROGRAM: Access Code: [email or web address] URL: [email or web address] 03/08/23 - Supine Heel Slide  - 2 x daily - 7 x weekly - 2 sets - 15 reps  03/01/23 - Standing 3-Way Leg Reach with Resistance at Ankles and Counter Support  - 1 x daily - 5 x weekly - 2 sets - 10 reps  Date: 02/24/2023 Prepared by: Seymour Bars  Exercises - Supine Quad Set  - 1-2  x daily - 2 sets - 10 reps - 3 seconds hold - Small Range Straight Leg Raise  - 1-2 x daily - 2 sets - 10 reps - Seated Long Arc Quad  - 1-2 x daily - 2 sets - 10 reps - Heel Toe Raises with Counter Support  - 1-2 x daily - 2 sets - 10 reps  ASSESSMENT:  CLINICAL IMPRESSION: Interventions today were geared towards LE strengthening and LE flexibility. Tolerated all activities partial relief of pain = 4/10. Demonstrated appropriate levels of fatigue. Rest periods provided. Provided slight amount of cueing to ensure correct execution of activity with good carry-over.  Pacing of activities was slightly slow today. To date, skilled PT is required to address the impairments and improve function. However, patient may need to verify with referring provider to further examine the integrity of the knee.  EVAL: Patient is a 77 y.o. female who was seen today for physical therapy evaluation and treatment for (L) TIBIAL PLATEAU FRACTURE ORIF (L) TIBIAL PLATEAU FX. Patient's condition is further defined by difficulty with walking due to pain, weakness, and decreased soft tissue extensibility. Skilled PT is required to address the impairments and functional limitations listed below. Patient is at moderate risk for DVT based on the Well's Criteria. Patient was  advised to see her MD about it. Also messaged referring MD via Epic about this concern. Spoke to patient in the morning of 02/10/23 via phone and patient reported that she will have [place] of her LE today.  OBJECTIVE IMPAIRMENTS: Abnormal gait, decreased activity tolerance, difficulty walking, decreased ROM, decreased strength, hypomobility, impaired flexibility, and pain.   ACTIVITY LIMITATIONS: carrying, lifting, bending, sitting, standing, squatting, and stairs  PARTICIPATION LIMITATIONS: meal prep, cleaning, laundry, driving, shopping, community activity, occupation, and yard work  PERSONAL FACTORS: Age are also affecting patient's functional outcome.    REHAB POTENTIAL: Good  CLINICAL DECISION MAKING: Stable/uncomplicated  EVALUATION COMPLEXITY: Low   GOALS: Goals reviewed with patient? Yes  SHORT TERM GOALS: Target date: 02/23/23 Pt will demonstrate indep in HEP to facilitate carry-over of skilled services and improve functional outcomes Goal status: INITIAL   LONG TERM GOALS: Target date: 03/09/23  Pt will decrease 5TSTS by at least 3 seconds in order to demonstrate clinically significant improvement in LE strength   Baseline: 11.25 sec Goal status: INITIAL  2.  Pt will increase by at least 40 ft in order to demonstrate clinically significant improvement in community ambulation  Baseline: 449 ft Goal status: INITIAL  3.  Patient will demonstrate increase in knee flex ROM by 10 deg and knee ext by 5 deg to facilitate ease in ambulation  Baseline: see above Goal status: INITIAL  4.  Pt will demonstrate increase in LE strength to 4+/5 to facilitate ease and safety in ambulation Baseline: 3-/5 Goal status: INITIAL  5.  Pt will improve ABC scale score by 70% in order to demonstrate improved confidence with balance and safety during ambulation and other ADLs  Baseline: 1.3% Goal status: INITIAL  PLAN:  PT FREQUENCY: 2x/week  PT DURATION: 4 weeks  PLANNED INTERVENTIONS: 97164- PT Re-evaluation, 97110-Therapeutic exercises, 97530- Therapeutic activity, 97112- Neuromuscular re-education, 97535- Self Care, 78469- Manual therapy, 97116- Gait training, 97014- Electrical stimulation (unattended), Patient/Family education, Stair training, Cryotherapy, and Moist heat  PLAN FOR NEXT SESSION: Continue POC and may progress as tolerated with emphasis on LE strengthening and flexibility as well as balance training.   Tish Frederickson. Monicia Tse, PT, DPT, OCS Board-Certified Clinical Specialist in Orthopedic PT PT Compact Privilege # ([place]): [id number]  03/08/2023, 1:53 PM

## 2023-03-09 NOTE — Progress Notes (Incomplete)
   03/08/23 1403  PT Visits / Re-Eval  Visit Number 7  Number of Visits 8  Date for PT Re-Evaluation 03/09/23  Authorization  Authorization Type [employer] (approved 8 visits)  Authorization Time Period 02/11/23 - 03/09/23  Authorization - Visit Number 6  Authorization - Number of Visits 8  PT Time Calculation  PT Start Time 1400  PT Stop Time 1430  PT Time Calculation (min) 30 min  PT - End of Session  Activity Tolerance Patient tolerated treatment well  Behavior During Therapy [facility] for tasks assessed/performed

## 2023-03-10 ENCOUNTER — Encounter (HOSPITAL_COMMUNITY): Payer: Medicare HMO

## 2023-03-22 ENCOUNTER — Other Ambulatory Visit: Payer: Self-pay | Admitting: Internal Medicine

## 2023-03-22 DIAGNOSIS — Z1231 Encounter for screening mammogram for malignant neoplasm of breast: Secondary | ICD-10-CM

## 2023-03-29 DIAGNOSIS — L820 Inflamed seborrheic keratosis: Secondary | ICD-10-CM | POA: Diagnosis not present

## 2023-03-29 DIAGNOSIS — L821 Other seborrheic keratosis: Secondary | ICD-10-CM | POA: Diagnosis not present

## 2023-03-29 DIAGNOSIS — D224 Melanocytic nevi of scalp and neck: Secondary | ICD-10-CM | POA: Diagnosis not present

## 2023-03-29 DIAGNOSIS — L814 Other melanin hyperpigmentation: Secondary | ICD-10-CM | POA: Diagnosis not present

## 2023-03-29 DIAGNOSIS — L720 Epidermal cyst: Secondary | ICD-10-CM | POA: Diagnosis not present

## 2023-03-31 ENCOUNTER — Telehealth: Payer: Self-pay | Admitting: Pharmacy Technician

## 2023-03-31 DIAGNOSIS — Z5986 Financial insecurity: Secondary | ICD-10-CM

## 2023-03-31 NOTE — Progress Notes (Addendum)
 Pharmacy Medication Assistance Program Note    03/31/2023  Patient ID: [name], female   DOB: [birth year], 78 y.o.   MRN: [id number]     03/31/2023  Outreach Medication One  Initial Outreach Date (Medication One) 01/07/2023  Manufacturer Medication One [employer] [facility] Tresiba   Dose of Tresiba  200 units/ml  Type of Radiographer, Therapeutic Assistance  Date Application Sent to Patient 01/12/2023  Application Items Requested Application;Proof of Income;Other  Date Application Sent to Prescriber 01/12/2023  Name of Prescriber Charlie Love  Date Application Received From Patient 03/30/2023  Application Items Received From Patient Application;Proof of Income;Other  Date Application Submitted to Manufacturer 03/30/2023  Method Application Sent to Manufacturer Fax       03/31/2023  Outreach Medication Two  Initial Outreach Date (Medication Two) 01/07/2023  Manufacturer Medication Two Novo Nordisk  [facility] Novolog   Dose of Novolog  100 units/ml  Type of Radiographer, Therapeutic Assistance  Date Application Sent to Patient 01/12/2023  Application Items Requested Application;Proof of Income;Other  Date Application Sent to Prescriber 01/12/2023  Name of Prescriber Charlie Love  Date Application Received From Patient 03/30/2023  Application Items Received From Patient Application;Proof of Income;Other  Date Application Submitted to Manufacturer 03/30/2023        03/31/2023  Outreach Medication Three  Initial Outreach Date (Medication Three) 01/07/2023  Manufacturer Medication Three Novo Nordisk  [facility] Ozempic  Dose of Ozempic 4mg /31ml  Type of Radiographer, Therapeutic Assistance  Date Application Sent to Patient 01/12/2023  Application Items Requested Application;Proof of Income;Other  Date Application Sent to Prescriber 01/12/2023  Name of Prescriber Charlie Love  Date Application Received From Patient 03/30/2023  Application Items Received From  Patient Application;Proof of Income;Other  Date Application Submitted to Manufacturer 03/30/2023  Method Application Sent to Manufacturer Fax    ADDENDUM 04/07/2023 Care coordination call placed to [employer] in regard to Tresiba , Novolog  and Ozempic. Spoke to [facility] who informs provider portion of application is missing. [facility] Team Pharmacist Norman Pray informs he left the provider portion of the application with Dr Gaston assistance as Dr. Love if out of the office today with the hopes he will sign and fax it to [facility] when in the office again. Application is on hold until provider part is received. Patient is aware and is willing to call the office if paperwork not received in timely manner.  Kate Caddy, CPhT [place]  Office: [phone] Fax: [phone] Email: [email or web address]

## 2023-04-04 DIAGNOSIS — Z1212 Encounter for screening for malignant neoplasm of rectum: Secondary | ICD-10-CM | POA: Diagnosis not present

## 2023-04-04 DIAGNOSIS — E1169 Type 2 diabetes mellitus with other specified complication: Secondary | ICD-10-CM | POA: Diagnosis not present

## 2023-04-04 DIAGNOSIS — E785 Hyperlipidemia, unspecified: Secondary | ICD-10-CM | POA: Diagnosis not present

## 2023-04-04 DIAGNOSIS — E039 Hypothyroidism, unspecified: Secondary | ICD-10-CM | POA: Diagnosis not present

## 2023-04-04 DIAGNOSIS — I10 Essential (primary) hypertension: Secondary | ICD-10-CM | POA: Diagnosis not present

## 2023-04-11 DIAGNOSIS — Z0000 Encounter for general adult medical examination without abnormal findings: Secondary | ICD-10-CM | POA: Diagnosis not present

## 2023-04-11 DIAGNOSIS — I10 Essential (primary) hypertension: Secondary | ICD-10-CM | POA: Diagnosis not present

## 2023-04-11 DIAGNOSIS — R82998 Other abnormal findings in urine: Secondary | ICD-10-CM | POA: Diagnosis not present

## 2023-04-11 DIAGNOSIS — E669 Obesity, unspecified: Secondary | ICD-10-CM | POA: Diagnosis not present

## 2023-04-11 DIAGNOSIS — E039 Hypothyroidism, unspecified: Secondary | ICD-10-CM | POA: Diagnosis not present

## 2023-04-11 DIAGNOSIS — I700 Atherosclerosis of aorta: Secondary | ICD-10-CM | POA: Diagnosis not present

## 2023-04-11 DIAGNOSIS — E1169 Type 2 diabetes mellitus with other specified complication: Secondary | ICD-10-CM | POA: Diagnosis not present

## 2023-04-11 DIAGNOSIS — Z1331 Encounter for screening for depression: Secondary | ICD-10-CM | POA: Diagnosis not present

## 2023-04-11 DIAGNOSIS — F329 Major depressive disorder, single episode, unspecified: Secondary | ICD-10-CM | POA: Diagnosis not present

## 2023-04-11 DIAGNOSIS — Z794 Long term (current) use of insulin: Secondary | ICD-10-CM | POA: Diagnosis not present

## 2023-04-11 DIAGNOSIS — Z1339 Encounter for screening examination for other mental health and behavioral disorders: Secondary | ICD-10-CM | POA: Diagnosis not present

## 2023-04-11 DIAGNOSIS — E785 Hyperlipidemia, unspecified: Secondary | ICD-10-CM | POA: Diagnosis not present

## 2023-04-15 ENCOUNTER — Telehealth: Payer: Self-pay | Admitting: Pharmacy Technician

## 2023-04-15 DIAGNOSIS — Z5986 Financial insecurity: Secondary | ICD-10-CM

## 2023-04-15 NOTE — Progress Notes (Signed)
Pharmacy Medication Assistance Program Note    04/15/2023  Patient ID: [name], female   DOB: 13-Jun-1945, 78 y.o.   MRN: [id number]     03/31/2023 04/15/2023  Outreach Medication One  Initial Outreach Date (Medication One) 01/07/2023   Manufacturer Medication One [employer] [facility] Tresiba   Dose of Tresiba 200 units/ml   Type of Radiographer, therapeutic Assistance   Date Application Sent to Patient 01/12/2023   Application Items Requested Application;Proof of Income;Other   Date Application Sent to Prescriber 01/12/2023   Name of Prescriber Geoffry Paradise   Date Application Received From Patient 03/30/2023   Application Items Received From Patient Application;Proof of Income;Other   Date Application Submitted to Manufacturer 03/30/2023   Method Application Sent to Manufacturer Fax   Patient Assistance Determination  Approved  Approval Start Date  04/12/2023  Approval End Date  03/21/2024  Patient Notification Method  Telephone Call  Telephone Call Outcome  Successful        03/31/2023 04/15/2023  Outreach Medication Two  Initial Outreach Date (Medication Two) 01/07/2023   Manufacturer Medication Two Novo Nordisk   [facility] Novolog   Dose of Novolog 100 units/ml   Type of Radiographer, therapeutic Assistance   Date Application Sent to Patient 01/12/2023   Application Items Requested Application;Proof of Income;Other   Date Application Sent to Prescriber 01/12/2023   Name of Prescriber Geoffry Paradise   Date Application Received From Patient 03/30/2023   Application Items Received From Patient Application;Proof of Income;Other   Date Application Submitted to Manufacturer 03/30/2023   Patient Assistance Determination  Approved  Approval Start Date  04/12/2023  Patient Notification Method  Telephone Call  Telephone Call Outcome  Successful         03/31/2023 04/15/2023  Outreach Medication Three  Initial Outreach Date (Medication Three) 01/07/2023    Manufacturer Medication Three Novo Nordisk   [facility] Ozempic   Dose of Ozempic 4mg /50ml   Type of Radiographer, therapeutic Assistance   Date Application Sent to Patient 01/12/2023   Application Items Requested Application;Proof of Income;Other   Date Application Sent to Prescriber 01/12/2023   Name of Prescriber Geoffry Paradise   Date Application Received From Patient 03/30/2023   Application Items Received From Patient Application;Proof of Income;Other   Date Application Submitted to Manufacturer 03/30/2023   Method Application Sent to Manufacturer Fax   Patient Assistance Determination  Approved  Approval Start Date  04/12/2023  Approval End Date  03/21/2024  Patient Notification Method  Telephone Call    Care coordination call placed to [employer] in regard to [place], [place], and Ozempic application.  Spoke to Dolphus Jenny who informs patient is APPROVED 04/12/23-03/21/2024. Initial and subsequent refill shipments will be processed automatically and delivered to the prescriber's office. Patient may call [employer] at any time to check on next shipments by calling [phone].  Successful outreach to patient. HIPAA verified. Informed patient of her approval, when to expect the first shipment as well as refill procedure. Patient verbalized understanding.  Pattricia Boss, CPhT [place]  Office: [phone] Fax: [phone] Email: [email or web address]

## 2023-04-19 DIAGNOSIS — Z961 Presence of intraocular lens: Secondary | ICD-10-CM | POA: Diagnosis not present

## 2023-04-19 DIAGNOSIS — E119 Type 2 diabetes mellitus without complications: Secondary | ICD-10-CM | POA: Diagnosis not present

## 2023-04-20 ENCOUNTER — Ambulatory Visit
Admission: RE | Admit: 2023-04-20 | Discharge: 2023-04-20 | Disposition: A | Discharge status: Home / Self Care | Payer: Medicare HMO | Source: Ambulatory Visit | Attending: Internal Medicine | Admitting: Internal Medicine

## 2023-04-20 DIAGNOSIS — Z1231 Encounter for screening mammogram for malignant neoplasm of breast: Secondary | ICD-10-CM

## 2023-04-28 DIAGNOSIS — E119 Type 2 diabetes mellitus without complications: Secondary | ICD-10-CM | POA: Diagnosis not present

## 2023-04-29 ENCOUNTER — Telehealth: Payer: Self-pay | Admitting: Internal Medicine

## 2023-04-29 DIAGNOSIS — G4733 Obstructive sleep apnea (adult) (pediatric): Secondary | ICD-10-CM

## 2023-04-29 NOTE — Telephone Encounter (Signed)
 Patient called answering service and would like sleep study results. States she did the test over a month ago. Please advise

## 2023-04-29 NOTE — Telephone Encounter (Signed)
 Dr. Linder Revere, please advise on the results of pt's sleep study.

## 2023-05-02 NOTE — Telephone Encounter (Signed)
 Her home sleep test confirmed that she has moderate obstructive sleep apnea, averaging 27 episodes/ hour. This result was needed so her DME company could replace her old CPAP machine.

## 2023-05-04 ENCOUNTER — Encounter: Payer: Self-pay | Admitting: Cardiology

## 2023-05-04 ENCOUNTER — Ambulatory Visit: Payer: Medicare HMO | Attending: Cardiology | Admitting: Cardiology

## 2023-05-04 VITALS — BP 104/60 | HR 89 | Ht 63.5 in | Wt 169.4 lb

## 2023-05-04 DIAGNOSIS — R0609 Other forms of dyspnea: Secondary | ICD-10-CM | POA: Diagnosis not present

## 2023-05-04 DIAGNOSIS — I10 Essential (primary) hypertension: Secondary | ICD-10-CM | POA: Diagnosis not present

## 2023-05-04 DIAGNOSIS — G4733 Obstructive sleep apnea (adult) (pediatric): Secondary | ICD-10-CM | POA: Diagnosis not present

## 2023-05-04 DIAGNOSIS — E782 Mixed hyperlipidemia: Secondary | ICD-10-CM

## 2023-05-04 NOTE — Patient Instructions (Signed)
Medication Instructions:   Your physician recommends that you continue on your current medications as directed. Please refer to the Current Medication list given to you today.   Labwork: None today  Testing/Procedures: Your physician has requested that you have an echocardiogram. Echocardiography is a painless test that uses sound waves to create images of your heart. It provides your doctor with information about the size and shape of your heart and how well your heart's chambers and valves are working. This procedure takes approximately one hour. There are no restrictions for this procedure. Please do NOT wear cologne, perfume, aftershave, or lotions (deodorant is allowed). Please arrive 15 minutes prior to your appointment time.  Please note: We ask at that you not bring children with you during ultrasound (echo/ vascular) testing. Due to room size and safety concerns, children are not allowed in the ultrasound rooms during exams. Our front office staff cannot provide observation of children in our lobby area while testing is being conducted. An adult accompanying a patient to their appointment will only be allowed in the ultrasound room at the discretion of the ultrasound technician under special circumstances. We apologize for any inconvenience.    Your physician has requested that you have a lexiscan myoview. For further information please visit [email or web address] Please follow instruction sheet, as given.   Follow-Up: To be determined after testing  Any Other Special Instructions Will Be Listed Below (If Applicable).  If you need a refill on your cardiac medications before your next appointment, please call your pharmacy.

## 2023-05-04 NOTE — Progress Notes (Signed)
    Cardiology Office Note  Date: 05/04/2023   ID: GRIER CZERWINSKI, DOB Apr 13, 1945, MRN [id number]  History of Present Illness: [name] is a 78 y.o. female last seen in August 2024.  She is here for a follow-up visit.  She does not report any exertional chest pain or palpitations, but states that her dyspnea on exertion has been getting worse.  She has moved into a first-floor apartment so that she does not have to take the steps.  No orthopnea or PND, no leg swelling.  No weight gain.  I reviewed her medications.  Current regimen includes Norvasc, aspirin, Avapro, Toprol-XL, Maxide, Ozempic and Crestor.  Blood pressure is well-controlled today.  She is following lipids with her PCP.  We discussed her cardiac testing from 2022 and 2023, at that point showing no evidence of cardiomyopathy or significant ischemia.  Physical Exam: VS:  BP 104/60 (BP Location: Right Arm, Patient Position: Sitting, Cuff Size: Normal)   Pulse 89   Ht 5' 3.5" (1.613 m)   Wt 169 lb 6.4 oz (76.8 kg)   SpO2 99%   BMI 29.54 kg/m , BMI Body mass index is 29.54 kg/m.  Wt Readings from Last 3 Encounters:  05/04/23 169 lb 6.4 oz (76.8 kg)  02/21/23 176 lb (79.8 kg)  10/27/22 171 lb 6.4 oz (77.7 kg)    General: Patient appears comfortable at rest. HEENT: Conjunctiva and lids normal. Neck: Supple, no elevated JVP or carotid bruits. Lungs: Clear to auscultation, nonlabored breathing at rest. Cardiac: Regular rate and rhythm, no S3 or significant systolic murmur, no pericardial rub. Extremities: No pitting edema.  ECG:  An ECG dated 07/29/2022 was personally reviewed today and demonstrated:  Sinus rhythm with prolonged PR interval, left anterior fascicular block, poor R wave progression.  Labwork: 08/04/2022: Magnesium 1.8 09/23/2022: BUN 10; Creatinine, Ser 0.68; Hemoglobin 11.8; Platelets 237; Potassium 3.7; Sodium 129   Other Studies Reviewed Today:  No interval cardiac testing for review  today.  Assessment and Plan:  1.  Dyspnea on exertion and history of atypical chest pain.  Prior cardiac structural and ischemic testing was reassuring in 2022-2023.  She states that symptoms are worsening however, shows good blood pressure control, no obvious fluid retention.  Plan will be to obtain a follow-up echocardiogram and Lexiscan Myoview.  We have discussed the possibility of a cardiac catheterization if necessary.   2.  Primary hypertension.  Blood pressure is well-controlled today.  Continue Avapro, Toprol-XL, and Maxide.   3.  Mixed hyperlipidemia.  She remains on Crestor with follow-up by Dr. Lorain Childes.   4.  OSA on CPAP.  She reports compliance with treatment.  Disposition:  Follow up  test results.  Signed, Jonelle Sidle, M.D., F.A.C.C. [facility] [place]

## 2023-05-23 ENCOUNTER — Encounter (HOSPITAL_COMMUNITY)
Admission: RE | Admit: 2023-05-23 | Discharge: 2023-05-23 | Disposition: A | Discharge status: Home / Self Care | Payer: Medicare HMO | Source: Ambulatory Visit | Attending: Cardiology | Admitting: Cardiology

## 2023-05-23 ENCOUNTER — Ambulatory Visit (HOSPITAL_BASED_OUTPATIENT_CLINIC_OR_DEPARTMENT_OTHER)
Admission: RE | Admit: 2023-05-23 | Discharge: 2023-05-23 | Disposition: A | Discharge status: Home / Self Care | Payer: Medicare HMO | Source: Ambulatory Visit | Attending: Cardiology | Admitting: Cardiology

## 2023-05-23 ENCOUNTER — Ambulatory Visit (HOSPITAL_COMMUNITY)
Admission: RE | Admit: 2023-05-23 | Discharge: 2023-05-23 | Disposition: A | Discharge status: Home / Self Care | Payer: Medicare HMO | Source: Ambulatory Visit | Attending: Cardiology | Admitting: Cardiology

## 2023-05-23 DIAGNOSIS — R0609 Other forms of dyspnea: Secondary | ICD-10-CM

## 2023-05-23 LAB — ECHOCARDIOGRAM COMPLETE
Area-P 1/2: 2.5 cm2
Est EF: >75
S' Lateral: 2 cm

## 2023-05-23 LAB — NM MYOCAR MULTI W/SPECT W/WALL MOTION / EF
Base ST Depression (mm): 0 mm
LV dias vol: 64 mL (ref 46–106)
LV sys vol: 15 mL
Nuc Stress EF: 76 %
RATE: 0.4
Rest Nuclear Isotope Dose: 9.2 mCi
SDS: 1
SRS: 0
SSS: 1
ST Depression (mm): 0 mm
Stress Nuclear Isotope Dose: 30 mCi
TID: 1.27

## 2023-05-23 MED ORDER — SODIUM CHLORIDE FLUSH 0.9 % IV SOLN
INTRAVENOUS | Status: AC
  Administered 2023-05-23: 10 mL via INTRAVENOUS
  Filled 2023-05-23: qty 10

## 2023-05-23 MED ORDER — TECHNETIUM TC 99M TETROFOSMIN IV KIT
10.0000 | PACK | Freq: Once | INTRAVENOUS | Status: AC | PRN
  Administered 2023-05-23: 9.2 via INTRAVENOUS

## 2023-05-23 MED ORDER — TECHNETIUM TC 99M TETROFOSMIN IV KIT
30.0000 | PACK | Freq: Once | INTRAVENOUS | Status: AC | PRN
  Administered 2023-05-23: 30 via INTRAVENOUS

## 2023-05-23 MED ORDER — REGADENOSON 0.4 MG/5ML IV SOLN
INTRAVENOUS | Status: AC
  Administered 2023-05-23: 0.4 mg via INTRAVENOUS
  Filled 2023-05-23: qty 5

## 2023-05-23 NOTE — Progress Notes (Signed)
*  PRELIMINARY RESULTS* Echocardiogram 2D Echocardiogram has been performed.  Stacey Drain 05/23/2023, 9:36 AM

## 2023-06-06 DIAGNOSIS — E785 Hyperlipidemia, unspecified: Secondary | ICD-10-CM | POA: Diagnosis not present

## 2023-06-06 DIAGNOSIS — M25561 Pain in right knee: Secondary | ICD-10-CM | POA: Diagnosis not present

## 2023-06-06 DIAGNOSIS — I10 Essential (primary) hypertension: Secondary | ICD-10-CM | POA: Diagnosis not present

## 2023-06-06 DIAGNOSIS — K219 Gastro-esophageal reflux disease without esophagitis: Secondary | ICD-10-CM | POA: Diagnosis not present

## 2023-06-06 DIAGNOSIS — E669 Obesity, unspecified: Secondary | ICD-10-CM | POA: Diagnosis not present

## 2023-06-06 DIAGNOSIS — I25118 Atherosclerotic heart disease of native coronary artery with other forms of angina pectoris: Secondary | ICD-10-CM | POA: Diagnosis not present

## 2023-06-06 DIAGNOSIS — E1169 Type 2 diabetes mellitus with other specified complication: Secondary | ICD-10-CM | POA: Diagnosis not present

## 2023-06-06 DIAGNOSIS — R5383 Other fatigue: Secondary | ICD-10-CM | POA: Diagnosis not present

## 2023-06-06 DIAGNOSIS — I700 Atherosclerosis of aorta: Secondary | ICD-10-CM | POA: Diagnosis not present

## 2023-06-06 DIAGNOSIS — S82142D Displaced bicondylar fracture of left tibia, subsequent encounter for closed fracture with routine healing: Secondary | ICD-10-CM | POA: Diagnosis not present

## 2023-06-06 DIAGNOSIS — E039 Hypothyroidism, unspecified: Secondary | ICD-10-CM | POA: Diagnosis not present

## 2023-07-06 DIAGNOSIS — Z794 Long term (current) use of insulin: Secondary | ICD-10-CM | POA: Diagnosis not present

## 2023-07-06 DIAGNOSIS — F331 Major depressive disorder, recurrent, moderate: Secondary | ICD-10-CM | POA: Diagnosis not present

## 2023-07-06 DIAGNOSIS — E785 Hyperlipidemia, unspecified: Secondary | ICD-10-CM | POA: Diagnosis not present

## 2023-07-06 DIAGNOSIS — I25118 Atherosclerotic heart disease of native coronary artery with other forms of angina pectoris: Secondary | ICD-10-CM | POA: Diagnosis not present

## 2023-07-06 DIAGNOSIS — I10 Essential (primary) hypertension: Secondary | ICD-10-CM | POA: Diagnosis not present

## 2023-07-06 DIAGNOSIS — I700 Atherosclerosis of aorta: Secondary | ICD-10-CM | POA: Diagnosis not present

## 2023-07-06 DIAGNOSIS — E1169 Type 2 diabetes mellitus with other specified complication: Secondary | ICD-10-CM | POA: Diagnosis not present

## 2023-07-12 ENCOUNTER — Other Ambulatory Visit (HOSPITAL_COMMUNITY): Payer: Self-pay | Admitting: Internal Medicine

## 2023-07-12 DIAGNOSIS — R42 Dizziness and giddiness: Secondary | ICD-10-CM

## 2023-07-12 DIAGNOSIS — R5383 Other fatigue: Secondary | ICD-10-CM

## 2023-07-19 DIAGNOSIS — S82142D Displaced bicondylar fracture of left tibia, subsequent encounter for closed fracture with routine healing: Secondary | ICD-10-CM | POA: Diagnosis not present

## 2023-07-19 DIAGNOSIS — M25511 Pain in right shoulder: Secondary | ICD-10-CM | POA: Diagnosis not present

## 2023-07-19 DIAGNOSIS — M25561 Pain in right knee: Secondary | ICD-10-CM | POA: Diagnosis not present

## 2023-07-22 ENCOUNTER — Encounter (HOSPITAL_COMMUNITY): Payer: Self-pay

## 2023-07-22 ENCOUNTER — Ambulatory Visit (HOSPITAL_COMMUNITY)

## 2023-07-27 DIAGNOSIS — E119 Type 2 diabetes mellitus without complications: Secondary | ICD-10-CM | POA: Diagnosis not present

## 2023-07-28 DIAGNOSIS — Z794 Long term (current) use of insulin: Secondary | ICD-10-CM | POA: Diagnosis not present

## 2023-07-28 DIAGNOSIS — E785 Hyperlipidemia, unspecified: Secondary | ICD-10-CM | POA: Diagnosis not present

## 2023-07-28 DIAGNOSIS — I10 Essential (primary) hypertension: Secondary | ICD-10-CM | POA: Diagnosis not present

## 2023-07-28 DIAGNOSIS — E1169 Type 2 diabetes mellitus with other specified complication: Secondary | ICD-10-CM | POA: Diagnosis not present

## 2023-07-28 DIAGNOSIS — K760 Fatty (change of) liver, not elsewhere classified: Secondary | ICD-10-CM | POA: Diagnosis not present

## 2023-08-22 DIAGNOSIS — I10 Essential (primary) hypertension: Secondary | ICD-10-CM | POA: Diagnosis not present

## 2023-08-22 DIAGNOSIS — Z794 Long term (current) use of insulin: Secondary | ICD-10-CM | POA: Diagnosis not present

## 2023-08-22 DIAGNOSIS — E1169 Type 2 diabetes mellitus with other specified complication: Secondary | ICD-10-CM | POA: Diagnosis not present

## 2023-08-22 DIAGNOSIS — M542 Cervicalgia: Secondary | ICD-10-CM | POA: Diagnosis not present

## 2023-09-05 DIAGNOSIS — E1169 Type 2 diabetes mellitus with other specified complication: Secondary | ICD-10-CM | POA: Diagnosis not present

## 2023-09-05 DIAGNOSIS — R058 Other specified cough: Secondary | ICD-10-CM | POA: Diagnosis not present

## 2023-09-05 DIAGNOSIS — I10 Essential (primary) hypertension: Secondary | ICD-10-CM | POA: Diagnosis not present

## 2023-09-05 DIAGNOSIS — J3489 Other specified disorders of nose and nasal sinuses: Secondary | ICD-10-CM | POA: Diagnosis not present

## 2023-09-20 DIAGNOSIS — S82142D Displaced bicondylar fracture of left tibia, subsequent encounter for closed fracture with routine healing: Secondary | ICD-10-CM | POA: Diagnosis not present

## 2023-09-24 DIAGNOSIS — S62337A Displaced fracture of neck of fifth metacarpal bone, left hand, initial encounter for closed fracture: Secondary | ICD-10-CM | POA: Diagnosis not present

## 2023-09-26 DIAGNOSIS — S62337A Displaced fracture of neck of fifth metacarpal bone, left hand, initial encounter for closed fracture: Secondary | ICD-10-CM | POA: Diagnosis not present

## 2023-09-26 DIAGNOSIS — M79642 Pain in left hand: Secondary | ICD-10-CM | POA: Diagnosis not present

## 2023-10-03 DIAGNOSIS — Z794 Long term (current) use of insulin: Secondary | ICD-10-CM | POA: Diagnosis not present

## 2023-10-03 DIAGNOSIS — E785 Hyperlipidemia, unspecified: Secondary | ICD-10-CM | POA: Diagnosis not present

## 2023-10-03 DIAGNOSIS — I10 Essential (primary) hypertension: Secondary | ICD-10-CM | POA: Diagnosis not present

## 2023-10-03 DIAGNOSIS — E1169 Type 2 diabetes mellitus with other specified complication: Secondary | ICD-10-CM | POA: Diagnosis not present

## 2023-10-03 DIAGNOSIS — Z8781 Personal history of (healed) traumatic fracture: Secondary | ICD-10-CM | POA: Diagnosis not present

## 2023-10-03 DIAGNOSIS — I700 Atherosclerosis of aorta: Secondary | ICD-10-CM | POA: Diagnosis not present

## 2023-10-07 DIAGNOSIS — S62337A Displaced fracture of neck of fifth metacarpal bone, left hand, initial encounter for closed fracture: Secondary | ICD-10-CM | POA: Diagnosis not present

## 2023-10-18 ENCOUNTER — Ambulatory Visit: Payer: Self-pay | Admitting: Student

## 2023-10-18 DIAGNOSIS — M1732 Unilateral post-traumatic osteoarthritis, left knee: Secondary | ICD-10-CM | POA: Diagnosis not present

## 2023-10-18 DIAGNOSIS — S82142D Displaced bicondylar fracture of left tibia, subsequent encounter for closed fracture with routine healing: Secondary | ICD-10-CM | POA: Diagnosis not present

## 2023-10-20 ENCOUNTER — Other Ambulatory Visit: Payer: Self-pay

## 2023-10-20 ENCOUNTER — Encounter (HOSPITAL_COMMUNITY): Payer: Self-pay | Admitting: Student

## 2023-10-20 NOTE — Progress Notes (Signed)
 PCP - Dr Charlie Love Cardiologist - Dr Jayson Sierras (last OV 05/04/23) Pulmonology - Dr Reggy Salt  Chest x-ray - n/a EKG - DOS Stress Test - 05/23/23 ECHO - 05/23/23 Cardiac Cath - n/a  ICD Pacemaker/Loop - n/a  Sleep Study -  Yes (02/2023) CPAP - uses CPAP nightly  Diabetes Type 2 Libre 3 System, Sensor located on left upper arm Fasting Blood Sugar - 120-130s Checks Blood Sugar several times a day  Do take 16 Units of Tresiba Insulin  on the morning of surgery.  Hold Ozempic 7 days prior to procedure.  Last dose was on 10/14/23.  Do not take Humalog Insulin  on the morning of surgery unless your CBG is greater than 220 mg/dL.  If CBG is greater than 220 mg/dL, you may take  of your sliding scale (correction) dose of insulin .  Blood Thinner Instructions:  n/a  Aspirin  Instructions: Last dose was on 10/17/23.  NPO  Anesthesia review: Yes  STOP now taking any Aspirin  (unless otherwise instructed by your surgeon), Aleve , Naproxen , Ibuprofen, Motrin, Advil, Goody's, BC's, all herbal medications, fish oil, and all vitamins.   Coronavirus Screening Do you have any of the following symptoms:  Cough yes/no: No Fever (>100.87F)  yes/no: No Runny nose yes/no: No Sore throat yes/no: No Difficulty breathing/shortness of breath  yes/no: No  Have you traveled in the last 14 days and where? yes/no: No  Patient verbalized understanding of instructions that were given via phone.

## 2023-10-20 NOTE — Progress Notes (Signed)
 Anesthesia Chart Review: Same day workup  78 year old female follows with cardiology for history of DOE.  She had prior evaluation of atypical chest pain in 2022 with benign echo and stress test.  She was most recently seen by Dr. Debera on 05/04/2023 and reported some worsening DOE.  Follow-up echocardiogram and Lexiscan  Myoview  were ordered.  Echo 05/23/2023 showed LVEF >75%, grade 1 DD, mild mitral regurgitation.  Dr. Debera commented on result stating,  Echocardiogram shows vigorous LVEF greater than 75% with mild diastolic dysfunction, mild aortic regurgitation with no major valvular abnormalities.  Overall reassuring. Stress test 05/23/2023 negative for ischemia.  Dr. Debera commented on results stating, please let her know that the stress test looked good, no ischemia to suggest obstructive CAD as cause of symptoms.  Other pertinent history includes IDDM 2 (A1c 7.0 on 06/06/2023), GERD on PPI, HTN, CVA 2016, OSA on CPAP, postop DVT 07/2022.  Patient will need day of surgery labs and evaluation.  EKG 07/29/2022: Sinus rhythm.  Rate 71. Prolonged PR interval. LAD, consider left anterior fascicular block. Low voltage, precordial leads. Abnormal R-wave progression, late transition. Borderline T abnormalities, anterior leads  Nuclear stress 05/23/2023:   Stress is negative for ischemia. Occasional PVCs are noted with stress.   LV perfusion is normal. There is no evidence of ischemia. There is no evidence of infarction.   Left ventricular function is normal. Nuclear stress EF: 76%. End diastolic cavity size is normal. End systolic cavity size is normal.   Findings are consistent with no ischemia and no infarction. The study is low risk.  TTE 05/23/2023:  1. Left ventricular ejection fraction, by estimation, is >75%. The left  ventricle has hyperdynamic function. The left ventricle has no regional  wall motion abnormalities. Left ventricular diastolic parameters are  consistent with Grade I  diastolic  dysfunction (impaired relaxation).   2. Right ventricular systolic function is normal. The right ventricular  size is normal. Tricuspid regurgitation signal is inadequate for assessing  PA pressure.   3. The mitral valve is normal in structure. Mild mitral valve  regurgitation. No evidence of mitral stenosis.   4. The aortic valve is tricuspid. Aortic valve regurgitation is mild. No  aortic stenosis is present.   5. The inferior vena cava is normal in size with greater than 50%  respiratory variability, suggesting right atrial pressure of 3 mmHg.   6. Increased flow velocities may be secondary to anemia, thyrotoxicosis,  hyperdynamic or high flow state.   Comparison(s): No significant change from prior study.     Lynwood Geofm RIGGERS [facility] Short Stay Center/Anesthesiology Phone [phone] 10/20/2023 10:52 AM

## 2023-10-20 NOTE — Anesthesia Preprocedure Evaluation (Signed)
 Anesthesia Evaluation  Patient identified by MRN, date of birth, ID band Patient awake    Reviewed: Allergy & Precautions, NPO status , Patient's Chart, lab work & pertinent test results  History of Anesthesia Complications Negative for: history of anesthetic complications  Airway Mallampati: II  TM Distance: >3 FB Neck ROM: Full    Dental no notable dental hx. (+) Teeth Intact   Pulmonary sleep apnea and Continuous Positive Airway Pressure Ventilation , neg COPD, Patient abstained from smoking.Not current smoker   Pulmonary exam normal breath sounds clear to auscultation       Cardiovascular Exercise Tolerance: Good METShypertension, Pt. on medications (-) CAD and (-) Past MI (-) dysrhythmias  Rhythm:Regular Rate:Normal - Systolic murmurs    Neuro/Psych  PSYCHIATRIC DISORDERS Anxiety Depression    CVA, No Residual Symptoms    GI/Hepatic ,GERD  Medicated and Controlled,,(+)     (-) substance abuse    Endo/Other  diabetesHypothyroidism  Last took ozempic 7 days ago. Denies GI symptoms today  Renal/GU negative Renal ROS     Musculoskeletal  (+)  Fibromyalgia -  Abdominal   Peds  Hematology   Anesthesia Other Findings PAT note by Lynwood Hope, PA-C: 79 year old female follows with cardiology for history of DOE.  She had prior evaluation of atypical chest pain in 2022 with benign echo and stress test.  She was most recently seen by Dr. Debera on 05/04/2023 and reported some worsening DOE.  Follow-up echocardiogram and Lexiscan  Myoview  were ordered.  Echo 05/23/2023 showed LVEF >75%, grade 1 DD, mild mitral regurgitation.  Dr. Debera commented on result stating, Echocardiogram shows vigorous LVEF greater than 75% with mild diastolic dysfunction, mild aortic regurgitation with no major valvular abnormalities. Overall reassuring. Stress test 05/23/2023 negative for ischemia.  Dr. Debera commented on results stating,  please let her know that the stress test looked good, no ischemia to suggest obstructive CAD as cause of symptoms.  Other pertinent history includes IDDM 2 (A1c 7.0 on 06/06/2023), GERD on PPI, HTN, CVA 2016, OSA on CPAP, postop DVT 07/2022.  Patient will need day of surgery labs and evaluation.  EKG 07/29/2022: Sinus rhythm.  Rate 71. Prolonged PR interval. LAD, consider left anterior fascicular block. Low voltage, precordial leads. Abnormal R-wave progression, late transition. Borderline T abnormalities, anterior leads  Nuclear stress 05/23/2023:   Stress is negative for ischemia. Occasional PVCs are noted with stress.   LV perfusion is normal. There is no evidence of ischemia. There is no evidence of infarction.   Left ventricular function is normal. Nuclear stress EF: 76%. End diastolic cavity size is normal. End systolic cavity size is normal.   Findings are consistent with no ischemia and no infarction. The study is low risk.  TTE 05/23/2023: 1. Left ventricular ejection fraction, by estimation, is >75%. The left  ventricle has hyperdynamic function. The left ventricle has no regional  wall motion abnormalities. Left ventricular diastolic parameters are  consistent with Grade I diastolic  dysfunction (impaired relaxation).  2. Right ventricular systolic function is normal. The right ventricular  size is normal. Tricuspid regurgitation signal is inadequate for assessing  PA pressure.  3. The mitral valve is normal in structure. Mild mitral valve  regurgitation. No evidence of mitral stenosis.  4. The aortic valve is tricuspid. Aortic valve regurgitation is mild. No  aortic stenosis is present.  5. The inferior vena cava is normal in size with greater than 50%  respiratory variability, suggesting right atrial pressure of 3 mmHg.  6.  Increased flow velocities may be secondary to anemia, thyrotoxicosis,  hyperdynamic or high flow state.   Comparison(s): No significant change  from prior study.       Reproductive/Obstetrics                              Anesthesia Physical Anesthesia Plan  ASA: 3  Anesthesia Plan: General   Post-op Pain Management: Ofirmev  IV (intra-op)*   Induction: Intravenous  PONV Risk Score and Plan: 3 and Ondansetron , Dexamethasone  and Treatment may vary due to age or medical condition  Airway Management Planned: LMA  Additional Equipment: None  Intra-op Plan:   Post-operative Plan: Extubation in OR  Informed Consent: I have reviewed the patients History and Physical, chart, labs and discussed the procedure including the risks, benefits and alternatives for the proposed anesthesia with the patient or authorized representative who has indicated his/her understanding and acceptance.     Dental advisory given  Plan Discussed with: CRNA and Surgeon  Anesthesia Plan Comments: (Discussed risks of anesthesia with patient, including PONV, sore throat, lip/dental/eye damage. Rare risks discussed as well, such as cardiorespiratory and neurological sequelae, and allergic reactions. Discussed the role of CRNA in patient's perioperative care. Patient understands.)         Anesthesia Quick Evaluation

## 2023-10-20 NOTE — H&P (Signed)
 [facility] (OTS) H&P  Patient ID: Faryn Sieg Calixto MRN: [id number] DOB/AGE: [birth year] 78 y.o.  Reason for surgery: Hardware removal left proximal tibia  HPI: [employer] is a 78 y.o. female with past medical history of CVA, hypertension, hyperlipidemia, GERD, OSA presenting for surgery on left lower extremity. Patient sustained a fall on 07/29/2022, resulting in a left tibial plateau fracture. Patient underwent ORIF of left tibial plateau by Dr. Kendal on 07/30/2022.  Patient unfortunately had some loss of fixation early in her postoperative course, requiring revision fixation 09/02/2022.  Over the last 12 months, patient has gone on to fully heal her fracture unfortunately continues to have significant pain in the left knee due to worsening posttraumatic arthritis.  Is been discussed with patient that she will likely require a total knee arthroplasty.  Patient has attempted conservative management including NSAIDs, corticosteroid injections, and physical therapy but unfortunately has not gotten any relief from these modalities.  She presents now for hardware removal from the left proximal tibia. Patient on aspirin .  Ambulates at baseline with no assistive device  Past Medical History:  Diagnosis Date   Anxiety    Arthritis    Depression    DVT (deep venous thrombosis) (HCC) 08/02/2022   LLE 08/02/22   Essential hypertension    Eustachian tube dysfunction    Fibromyalgia    GERD (gastroesophageal reflux disease)    Hyperlipidemia    Hypothyroid    Mild carotid artery disease (HCC)    OSA (obstructive sleep apnea)    Osteopenia    Pneumonia    Stroke (HCC)    Type 2 diabetes mellitus (HCC)    Past Surgical History:  Procedure Laterality Date   ANKLE SURGERY     Anteriorvesicourethropexy     BUNIONECTOMY     COLONOSCOPY     NASAL SINUS SURGERY     ORIF TIBIA PLATEAU Left 07/30/2022   Procedure: OPEN REDUCTION INTERNAL FIXATION (ORIF) TIBIAL PLATEAU;  Surgeon:  Kendal Franky SQUIBB, MD;  Location: [facility] OR;  Service: Orthopedics;  Laterality: Left;   ORIF TIBIA PLATEAU Left 09/02/2022   Procedure: REVISION FIXATION OF LEFT TIBIAL PLATEAU FRACTURE;  Surgeon: Kendal Franky SQUIBB, MD;  Location: [facility] OR;  Service: Orthopedics;  Laterality: Left;   PATELLA FRACTURE SURGERY  2009   Right   TONSILLECTOMY     TOTAL ABDOMINAL HYSTERECTOMY     WRIST FRACTURE SURGERY  2009   Family History  Problem Relation Age of Onset   Heart attack Mother    Colon cancer Father     Social History:  reports that she has never smoked. She has never used smokeless tobacco. She reports that she does not drink alcohol  and does not use drugs.  Allergies:  Allergies  Allergen Reactions   Hydrochlorothiazide Itching   Oxycodone  Itching    Medications: Prior to Admission medications   Medication Sig Start Date End Date Taking? Authorizing Provider  acetaminophen  (TYLENOL ) 500 MG tablet Take 500 mg by mouth every 6 (six) hours as needed for moderate pain (pain score 4-6). 09/28/22  Yes [provider]  amLODipine  (NORVASC ) 5 MG tablet Take 5 mg by mouth daily. 08/30/20  Yes [provider]  aspirin  EC (ADULT ASPIRIN  REGIMEN) 81 MG tablet Take 81 mg by mouth every evening. 08/17/19  Yes [provider]  cetirizine  (ZYRTEC ) 10 MG tablet Take 1 tablet (10 mg total) by mouth daily. Patient taking differently: Take 10 mg by mouth daily as needed for allergies.  12/31/22  Yes Leath-Warren, Etta PARAS, NP  Cholecalciferol (VITAMIN D ) 50 MCG (2000 UT) tablet Take 2,000 Units by mouth daily.   Yes [provider]  Cyanocobalamin  (VITAMIN B-12) 5000 MCG SUBL Place 5,000 mcg under the tongue daily.   Yes [provider]  escitalopram (LEXAPRO) 10 MG tablet Take 10 mg by mouth in the morning. 06/06/23  Yes [provider]  insulin  degludec (TRESIBA FLEXTOUCH) 200 UNIT/ML FlexTouch Pen Inject 32 Units into the skin daily. 11/19/22  Yes [provider]  insulin  lispro (HUMALOG KWIKPEN) 100 UNIT/ML KwikPen Inject 3-5 Units into the skin 3 (three) times daily with meals. Sliding scale   Yes [provider]  irbesartan  (AVAPRO ) 300 MG tablet Take 300 mg by mouth daily.   Yes [provider]  MAGNESIUM  COMPLEX HIGH POTENCY PO Take 300 mg by mouth at bedtime.   Yes [provider]  metoprolol  succinate (TOPROL -XL) 50 MG 24 hr tablet Take 50 mg by mouth every evening. 09/30/20  Yes [provider]  Multiple Vitamins-Minerals (HAIR SKIN & NAILS) TABS Take 6,000 mcg by mouth daily.   Yes [provider]  Multiple Vitamins-Minerals (MULTIVITAMIN WITH MINERALS) tablet Take 1 tablet by mouth daily. Alive woman 50+   Yes [provider]  omeprazole (PRILOSEC) 20 MG capsule Take 20 mg by mouth daily. 12/28/19  Yes [provider]  rosuvastatin  (CRESTOR ) 20 MG tablet Take 20 mg by mouth at bedtime. 02/08/19  Yes [provider]  Semaglutide, 1 MG/DOSE, (OZEMPIC, 1 MG/DOSE,) 4 MG/3ML SOPN Inject 1 mg into the skin once a week. 11/19/22  Yes [provider]  triamterene -hydrochlorothiazide (MAXZIDE-25) 37.5-25 MG tablet Take 1 tablet by mouth daily. 08/22/22  Yes [provider]   I have reviewed the patient's current medications. Prior to Admission:  No medications prior to admission.    Positive ROS: All other systems have been reviewed and were otherwise negative with the exception of those mentioned in the HPI and as above.  Exam: There were no vitals taken for this visit. General: Alert and oriented, no acute distress Cardiovascular: No pedal edema Respiratory: No cyanosis, no use of accessory musculature GI: No organomegaly, abdomen is soft and non-tender Skin: No lesions in the area of chief complaint Neurologic: Sensation intact distally Psychiatric: Patient is competent for consent with normal mood and affect  Musculoskeletal: Left lower  extremity: Well-healed surgical incisions.  Tenderness with palpation to the medial lateral joint lines.  Some increased discomfort with knee motion.  Nontender through the calf or lower leg.  Motor and sensory function at baseline.  Neurovascularly intact.  Right lower extremity skin without lesions. No tenderness to palpation. Full painless ROM, full strength in each muscle group without evidence of instability. Motor/sensory function at baseline. Neurovascularly intact.   Medical Decision Making: Data: Imaging: AP and lateral views of the left knee show healed proximal tibia fracture.  Hardware stable.  Patient with moderate to severe osteoarthritic changes in the lateral femorotibial compartment  Labs: No results found for this or any previous visit (from the past 24 hours).   Medical history and chart was reviewed and case discussed with attending provider.  Assessment/Plan: 78 year old female status post ORIF of left proximal tibia fracture and 07/2022 with revision fixation on 08/2022  Patient noted to have underlying osteoarthritis of the left knee prior to her injury in 2024, but unfortunately injury to the left proximal tibia has led to worsening of symptoms and progression of her  osteoarthritis on imaging.  I discussed with the patient and her daughter that she will likely require total knee arthroplasty in the future given the level of arthritis in her left knee.  The first step in this process would be removing her current hardware.  The hope would be that removal of the hardware may provide some pain relief on its own but nonetheless, if she decides to proceed with total knee arthroplasty the hardware would need to be taken out.  Risks and benefits of the procedure been discussed with the patient and her daughter. Risks discussed included bleeding, infection, new injury to the left tibia, damage to surrounding nerves and blood vessels, continued pain, stiffness, progression of  underlying arthritis, DVT/PE, compartment syndrome, and even anesthesia complications.  Patient states understanding these risks and agrees to proceed with surgery.  Consent will be obtained.  Will plan to discharge patient home from the PACU postoperatively and allow her to continue weightbearing as tolerated to left lower extremity.     Lauraine PATRIC Moores PA-C [facility] [phone] (office) [email or web address]

## 2023-10-21 ENCOUNTER — Other Ambulatory Visit: Payer: Self-pay

## 2023-10-21 ENCOUNTER — Ambulatory Visit (HOSPITAL_COMMUNITY): Payer: Self-pay | Admitting: Physician Assistant

## 2023-10-21 ENCOUNTER — Encounter (HOSPITAL_COMMUNITY)
Admission: RE | Disposition: A | Discharge status: Home / Self Care | Payer: Self-pay | Source: Home / Self Care | Attending: Student

## 2023-10-21 ENCOUNTER — Ambulatory Visit (HOSPITAL_COMMUNITY)

## 2023-10-21 ENCOUNTER — Ambulatory Visit (HOSPITAL_COMMUNITY)
Admission: RE | Admit: 2023-10-21 | Discharge: 2023-10-21 | Disposition: A | Discharge status: Home / Self Care | Attending: Student | Admitting: Student

## 2023-10-21 ENCOUNTER — Encounter (HOSPITAL_COMMUNITY): Payer: Self-pay | Admitting: Physician Assistant

## 2023-10-21 ENCOUNTER — Encounter (HOSPITAL_COMMUNITY): Payer: Self-pay | Admitting: Student

## 2023-10-21 DIAGNOSIS — F418 Other specified anxiety disorders: Secondary | ICD-10-CM | POA: Diagnosis not present

## 2023-10-21 DIAGNOSIS — Z79899 Other long term (current) drug therapy: Secondary | ICD-10-CM | POA: Diagnosis not present

## 2023-10-21 DIAGNOSIS — Y798 Miscellaneous orthopedic devices associated with adverse incidents, not elsewhere classified: Secondary | ICD-10-CM | POA: Insufficient documentation

## 2023-10-21 DIAGNOSIS — Z8673 Personal history of transient ischemic attack (TIA), and cerebral infarction without residual deficits: Secondary | ICD-10-CM | POA: Insufficient documentation

## 2023-10-21 DIAGNOSIS — G4733 Obstructive sleep apnea (adult) (pediatric): Secondary | ICD-10-CM | POA: Insufficient documentation

## 2023-10-21 DIAGNOSIS — I10 Essential (primary) hypertension: Secondary | ICD-10-CM | POA: Insufficient documentation

## 2023-10-21 DIAGNOSIS — E039 Hypothyroidism, unspecified: Secondary | ICD-10-CM

## 2023-10-21 DIAGNOSIS — M797 Fibromyalgia: Secondary | ICD-10-CM | POA: Diagnosis not present

## 2023-10-21 DIAGNOSIS — F419 Anxiety disorder, unspecified: Secondary | ICD-10-CM | POA: Diagnosis not present

## 2023-10-21 DIAGNOSIS — K219 Gastro-esophageal reflux disease without esophagitis: Secondary | ICD-10-CM | POA: Diagnosis not present

## 2023-10-21 DIAGNOSIS — T8484XA Pain due to internal orthopedic prosthetic devices, implants and grafts, initial encounter: Secondary | ICD-10-CM

## 2023-10-21 DIAGNOSIS — E119 Type 2 diabetes mellitus without complications: Secondary | ICD-10-CM | POA: Insufficient documentation

## 2023-10-21 DIAGNOSIS — Z7985 Long-term (current) use of injectable non-insulin antidiabetic drugs: Secondary | ICD-10-CM | POA: Diagnosis not present

## 2023-10-21 DIAGNOSIS — F32A Depression, unspecified: Secondary | ICD-10-CM | POA: Insufficient documentation

## 2023-10-21 DIAGNOSIS — Z794 Long term (current) use of insulin: Secondary | ICD-10-CM | POA: Diagnosis not present

## 2023-10-21 DIAGNOSIS — Z472 Encounter for removal of internal fixation device: Secondary | ICD-10-CM | POA: Diagnosis not present

## 2023-10-21 DIAGNOSIS — M1732 Unilateral post-traumatic osteoarthritis, left knee: Secondary | ICD-10-CM | POA: Insufficient documentation

## 2023-10-21 HISTORY — PX: HARDWARE REMOVAL: SHX979

## 2023-10-21 LAB — GLUCOSE, CAPILLARY
Glucose-Capillary: 89 mg/dL (ref 70–99)
Glucose-Capillary: 101 mg/dL — ABNORMAL HIGH (ref 70–99)

## 2023-10-21 LAB — BASIC METABOLIC PANEL WITH GFR
Anion gap: 10 (ref 5–15)
BUN: 18 mg/dL (ref 8–23)
CO2: 21 mmol/L — ABNORMAL LOW (ref 22–32)
Calcium: 9.2 mg/dL (ref 8.9–10.3)
Chloride: 107 mmol/L (ref 98–111)
Creatinine, Ser: 0.98 mg/dL (ref 0.44–1.00)
GFR, Estimated: 60 mL/min — ABNORMAL LOW (ref >60)
Glucose, Bld: 84 mg/dL (ref 70–99)
Potassium: 3.7 mmol/L (ref 3.5–5.1)
Sodium: 138 mmol/L (ref 135–145)

## 2023-10-21 LAB — CBC
HCT: 36.8 % (ref 36.0–46.0)
Hemoglobin: 11.3 g/dL — ABNORMAL LOW (ref 12.0–15.0)
MCH: 24.6 pg — ABNORMAL LOW (ref 26.0–34.0)
MCHC: 30.7 g/dL (ref 30.0–36.0)
MCV: 80 fL (ref 80.0–100.0)
Platelets: 245 K/uL (ref 150–400)
RBC: 4.6 MIL/uL (ref 3.87–5.11)
RDW: 15.9 % — ABNORMAL HIGH (ref 11.5–15.5)
WBC: 8.1 K/uL (ref 4.0–10.5)
nRBC: 0 % (ref 0.0–0.2)

## 2023-10-21 LAB — SURGICAL PCR SCREEN
MRSA, PCR: POSITIVE — AB
Staphylococcus aureus: POSITIVE — AB

## 2023-10-21 SURGERY — REMOVAL, HARDWARE
Anesthesia: General | Laterality: Left

## 2023-10-21 MED ORDER — 0.9 % SODIUM CHLORIDE (POUR BTL) OPTIME
TOPICAL | Status: DC | PRN
  Administered 2023-10-21: 1000 mL

## 2023-10-21 MED ORDER — ACETAMINOPHEN 10 MG/ML IV SOLN
1000.0000 mg | Freq: Once | INTRAVENOUS | Status: DC | PRN
  Administered 2023-10-21: 1000 mg via INTRAVENOUS

## 2023-10-21 MED ORDER — FENTANYL CITRATE (PF) 250 MCG/5ML IJ SOLN
INTRAMUSCULAR | Status: DC | PRN
  Administered 2023-10-21: 50 ug via INTRAVENOUS
  Administered 2023-10-21 (×2): 25 ug via INTRAVENOUS
  Administered 2023-10-21 (×2): 50 ug via INTRAVENOUS

## 2023-10-21 MED ORDER — ONDANSETRON HCL 4 MG/2ML IJ SOLN
INTRAMUSCULAR | Status: DC | PRN
  Administered 2023-10-21: 4 mg via INTRAVENOUS

## 2023-10-21 MED ORDER — TRAMADOL HCL 50 MG PO TABS: 50.0000 mg | ORAL_TABLET | Freq: Four times a day (QID) | ORAL | 0 refills | Status: DC | PRN

## 2023-10-21 MED ORDER — ONDANSETRON HCL 4 MG/2ML IJ SOLN: 4.0000 mg | Freq: Once | INTRAMUSCULAR | Status: DC | PRN

## 2023-10-21 MED ORDER — METHOCARBAMOL 500 MG PO TABS
ORAL_TABLET | ORAL | Status: AC
Start: 2023-10-21 — End: 2023-10-21
  Filled 2023-10-21: qty 1

## 2023-10-21 MED ORDER — PROPOFOL 10 MG/ML IV BOLUS
INTRAVENOUS | Status: AC
  Filled 2023-10-21: qty 20

## 2023-10-21 MED ORDER — PHENYLEPHRINE 80 MCG/ML (10ML) SYRINGE FOR IV PUSH (FOR BLOOD PRESSURE SUPPORT)
PREFILLED_SYRINGE | INTRAVENOUS | Status: DC | PRN
  Administered 2023-10-21: 80 ug via INTRAVENOUS

## 2023-10-21 MED ORDER — VANCOMYCIN HCL 1000 MG IV SOLR
INTRAVENOUS | Status: AC
  Filled 2023-10-21: qty 20

## 2023-10-21 MED ORDER — CEFAZOLIN SODIUM-DEXTROSE 2-4 GM/100ML-% IV SOLN
2.0000 g | INTRAVENOUS | Status: AC
  Administered 2023-10-21: 2 g via INTRAVENOUS
  Filled 2023-10-21: qty 100

## 2023-10-21 MED ORDER — OXYCODONE HCL 5 MG PO TABS
ORAL_TABLET | ORAL | Status: AC
  Filled 2023-10-21: qty 1

## 2023-10-21 MED ORDER — ORAL CARE MOUTH RINSE: 15.0000 mL | Freq: Once | OROMUCOSAL | Status: AC

## 2023-10-21 MED ORDER — EPHEDRINE SULFATE-NACL 50-0.9 MG/10ML-% IV SOSY
PREFILLED_SYRINGE | INTRAVENOUS | Status: DC | PRN
  Administered 2023-10-21 (×3): 5 mg via INTRAVENOUS

## 2023-10-21 MED ORDER — CHLORHEXIDINE GLUCONATE 0.12 % MT SOLN
15.0000 mL | Freq: Once | OROMUCOSAL | Status: AC
  Administered 2023-10-21: 15 mL via OROMUCOSAL
  Filled 2023-10-21: qty 15

## 2023-10-21 MED ORDER — VANCOMYCIN HCL 1000 MG IV SOLR
INTRAVENOUS | Status: DC | PRN
  Administered 2023-10-21: 1000 mg via TOPICAL

## 2023-10-21 MED ORDER — LACTATED RINGERS IV SOLN: INTRAVENOUS | Status: DC

## 2023-10-21 MED ORDER — ACETAMINOPHEN 10 MG/ML IV SOLN
INTRAVENOUS | Status: AC
Start: 2023-10-21 — End: 2023-10-21
  Filled 2023-10-21: qty 100

## 2023-10-21 MED ORDER — FENTANYL CITRATE (PF) 100 MCG/2ML IJ SOLN: 25.0000 ug | INTRAMUSCULAR | Status: DC | PRN

## 2023-10-21 MED ORDER — PROPOFOL 10 MG/ML IV BOLUS
INTRAVENOUS | Status: DC | PRN
  Administered 2023-10-21: 110 mg via INTRAVENOUS
  Administered 2023-10-21: 20 mg via INTRAVENOUS

## 2023-10-21 MED ORDER — METHOCARBAMOL 500 MG PO TABS: 500.0000 mg | ORAL_TABLET | Freq: Four times a day (QID) | ORAL | 0 refills | Status: DC | PRN

## 2023-10-21 MED ORDER — OXYCODONE HCL 5 MG PO TABS
5.0000 mg | ORAL_TABLET | Freq: Once | ORAL | Status: AC
  Administered 2023-10-21: 5 mg via ORAL

## 2023-10-21 MED ORDER — DEXAMETHASONE SODIUM PHOSPHATE 10 MG/ML IJ SOLN
INTRAMUSCULAR | Status: DC | PRN
  Administered 2023-10-21: 5 mg via INTRAVENOUS

## 2023-10-21 MED ORDER — METHOCARBAMOL 500 MG PO TABS
500.0000 mg | ORAL_TABLET | Freq: Once | ORAL | Status: AC
  Administered 2023-10-21: 500 mg via ORAL

## 2023-10-21 MED ORDER — FENTANYL CITRATE (PF) 250 MCG/5ML IJ SOLN
INTRAMUSCULAR | Status: AC
  Filled 2023-10-21: qty 5

## 2023-10-21 MED ORDER — LIDOCAINE 2% (20 MG/ML) 5 ML SYRINGE
INTRAMUSCULAR | Status: DC | PRN
  Administered 2023-10-21: 80 mg via INTRAVENOUS

## 2023-10-21 SURGICAL SUPPLY — 56 items
BAG COUNTER SPONGE SURGICOUNT (BAG) ×1 IMPLANT
BANDAGE ESMARK 6X9 LF (GAUZE/BANDAGES/DRESSINGS) ×1 IMPLANT
BENZOIN TINCTURE PRP APPL 2/3 (GAUZE/BANDAGES/DRESSINGS) IMPLANT
BNDG COHESIVE 6X5 TAN ST LF (GAUZE/BANDAGES/DRESSINGS) ×1 IMPLANT
BNDG ELASTIC 4INX 5YD STR LF (GAUZE/BANDAGES/DRESSINGS) IMPLANT
BNDG ELASTIC 4X5.8 VLCR STR LF (GAUZE/BANDAGES/DRESSINGS) ×1 IMPLANT
BNDG ELASTIC 6INX 5YD STR LF (GAUZE/BANDAGES/DRESSINGS) ×1 IMPLANT
BNDG ELASTIC 6X10 VLCR STRL LF (GAUZE/BANDAGES/DRESSINGS) IMPLANT
BNDG GAUZE DERMACEA FLUFF 4 (GAUZE/BANDAGES/DRESSINGS) ×2 IMPLANT
BRUSH SCRUB EZ PLAIN DRY (MISCELLANEOUS) ×2 IMPLANT
CHLORAPREP W/TINT 26 (MISCELLANEOUS) ×1 IMPLANT
CLSR STERI-STRIP ANTIMIC 1/2X4 (GAUZE/BANDAGES/DRESSINGS) IMPLANT
COVER SURGICAL LIGHT HANDLE (MISCELLANEOUS) ×2 IMPLANT
CUFF TOURN SGL QUICK 18X4 (TOURNIQUET CUFF) IMPLANT
CUFF TRNQT CYL 24X4X16.5-23 (TOURNIQUET CUFF) IMPLANT
CUFF TRNQT CYL 34X4.125X (TOURNIQUET CUFF) IMPLANT
DRAPE C-ARM 42X72 X-RAY (DRAPES) IMPLANT
DRAPE C-ARMOR (DRAPES) ×1 IMPLANT
DRAPE U-SHAPE 47X51 STRL (DRAPES) ×1 IMPLANT
DRSG ADAPTIC 3X8 NADH LF (GAUZE/BANDAGES/DRESSINGS) ×1 IMPLANT
ELECTRODE REM PT RTRN 9FT ADLT (ELECTROSURGICAL) ×1 IMPLANT
GAUZE SPONGE 4X4 12PLY STRL (GAUZE/BANDAGES/DRESSINGS) ×1 IMPLANT
GLOVE BIO SURGEON STRL SZ 6.5 (GLOVE) ×3 IMPLANT
GLOVE BIO SURGEON STRL SZ7.5 (GLOVE) ×4 IMPLANT
GLOVE BIOGEL PI IND STRL 6.5 (GLOVE) ×1 IMPLANT
GLOVE BIOGEL PI IND STRL 7.5 (GLOVE) ×1 IMPLANT
GOWN STRL REUS W/ TWL LRG LVL3 (GOWN DISPOSABLE) ×2 IMPLANT
KIT BASIN OR (CUSTOM PROCEDURE TRAY) ×1 IMPLANT
KIT TURNOVER KIT B (KITS) ×1 IMPLANT
MANIFOLD NEPTUNE II (INSTRUMENTS) ×1 IMPLANT
NDL 22X1.5 STRL (OR ONLY) (MISCELLANEOUS) IMPLANT
NEEDLE 22X1.5 STRL (OR ONLY) (MISCELLANEOUS) IMPLANT
NS IRRIG 1000ML POUR BTL (IV SOLUTION) ×1 IMPLANT
PACK ORTHO EXTREMITY (CUSTOM PROCEDURE TRAY) ×1 IMPLANT
PAD ABD 8X10 STRL (GAUZE/BANDAGES/DRESSINGS) IMPLANT
PAD ARMBOARD POSITIONER FOAM (MISCELLANEOUS) ×2 IMPLANT
PADDING CAST ABS COTTON 4X4 ST (CAST SUPPLIES) IMPLANT
PADDING CAST COTTON 6X4 STRL (CAST SUPPLIES) ×3 IMPLANT
SPONGE T-LAP 18X18 ~~LOC~~+RFID (SPONGE) ×1 IMPLANT
STAPLER SKIN PROX 35W (STAPLE) IMPLANT
STOCKINETTE IMPERVIOUS LG (DRAPES) ×1 IMPLANT
STRIP CLOSURE SKIN 1/2X4 (GAUZE/BANDAGES/DRESSINGS) IMPLANT
SUCTION TUBE FRAZIER 10FR DISP (SUCTIONS) IMPLANT
SUT ETHILON 3 0 PS 1 (SUTURE) IMPLANT
SUT MNCRL AB 3-0 PS2 18 (SUTURE) ×1 IMPLANT
SUT MON AB 2-0 CT1 36 (SUTURE) ×1 IMPLANT
SUT PDS AB 2-0 CT1 27 (SUTURE) IMPLANT
SUT VIC AB 0 CT1 27XBRD ANBCTR (SUTURE) IMPLANT
SUT VIC AB 2-0 CT1 TAPERPNT 27 (SUTURE) IMPLANT
SYR CONTROL 10ML LL (SYRINGE) IMPLANT
TOWEL GREEN STERILE (TOWEL DISPOSABLE) ×2 IMPLANT
TOWEL GREEN STERILE FF (TOWEL DISPOSABLE) ×2 IMPLANT
TUBE CONNECTING 12X1/4 (SUCTIONS) ×1 IMPLANT
UNDERPAD 30X36 HEAVY ABSORB (UNDERPADS AND DIAPERS) ×1 IMPLANT
WATER STERILE IRR 1000ML POUR (IV SOLUTION) ×2 IMPLANT
YANKAUER SUCT BULB TIP NO VENT (SUCTIONS) ×1 IMPLANT

## 2023-10-21 NOTE — Anesthesia Procedure Notes (Signed)
 Procedure Name: LMA Insertion Date/Time: 10/21/2023 7:34 AM  Performed by: Vertie Arthea RAMAN, CRNAPre-anesthesia Checklist: Patient identified, Emergency Drugs available, Suction available and Patient being monitored Patient Re-evaluated:Patient Re-evaluated prior to induction Oxygen Delivery Method: Circle System Utilized Preoxygenation: Pre-oxygenation with 100% oxygen Induction Type: IV induction Ventilation: Mask ventilation without difficulty LMA: LMA inserted LMA Size: 4.0 Tube type: Oral Number of attempts: 1 Airway Equipment and Method: Stylet and Oral airway Placement Confirmation: ETT inserted through vocal cords under direct vision, positive ETCO2 and breath sounds checked- equal and bilateral Tube secured with: Tape Dental Injury: Teeth and Oropharynx as per pre-operative assessment

## 2023-10-21 NOTE — Anesthesia Postprocedure Evaluation (Signed)
 Anesthesia Post Note  Patient: [employer]  Procedure(s) Performed: REMOVAL, HARDWARE PROXIMAL TIBIA (Left)     Patient location during evaluation: PACU Anesthesia Type: General Level of consciousness: awake and alert Pain management: pain level controlled Vital Signs Assessment: post-procedure vital signs reviewed and stable Respiratory status: spontaneous breathing, nonlabored ventilation, respiratory function stable and patient connected to nasal cannula oxygen Cardiovascular status: blood pressure returned to baseline and stable Postop Assessment: no apparent nausea or vomiting Anesthetic complications: no   No notable events documented.  Last Vitals:  Vitals:   10/21/23 0915 10/21/23 0930  BP: 122/74 133/67  Pulse: 75 72  Resp: 11 18  Temp: 36.7 C   SpO2: 96% 97%    Last Pain:  Vitals:   10/21/23 0900  TempSrc:   PainSc: 7                  Rome Ade

## 2023-10-21 NOTE — Interval H&P Note (Signed)
 History and Physical Interval Note:  10/21/2023 7:21 AM  [name]  has presented today for surgery, with the diagnosis of Painful hardware.  The various methods of treatment have been discussed with the patient and family. After consideration of risks, benefits and other options for treatment, the patient has consented to  Procedure(s): REMOVAL, HARDWARE PROXIMAL TIBIA (Left) as a surgical intervention.  The patient's history has been reviewed, patient examined, no change in status, stable for surgery.  I have reviewed the patient's chart and labs.  Questions were answered to the patient's satisfaction.     Ausencio Vaden P Romolo Sieling

## 2023-10-21 NOTE — Discharge Instructions (Signed)
 [facility] Discharge Instructions   General Discharge Instructions  WEIGHT BEARING STATUS:Weightbearing as tolerated  RANGE OF MOTION/ACTIVITY: Ok for knee motion as tolerated  Wound Care: You may remove your surgical dressing on post op day #3 (Monday 10/24/23). Incisions can be left open to air if there is no drainage. Once the incision is completely dry and without drainage, it may be left open to air out.  Showering may begin post op day $4 (Tuesday 10/25/23).  Clean incision gently with soap and water.  DVT/PE prophylaxis: Resume home dose Aspirin  on post op day #1  Diet: as you were eating previously.  Can use over the counter stool softeners and bowel preparations, such as Miralax , to help with bowel movements.  Narcotics can be constipating.  Be sure to drink plenty of fluids  PAIN MEDICATION USE AND EXPECTATIONS  You have likely been given narcotic medications to help control your pain.  After a traumatic event that results in an fracture (broken bone) with or without surgery, it is ok to use narcotic pain medications to help control one's pain.  We understand that everyone responds to pain differently and each individual patient will be evaluated on a regular basis for the continued need for narcotic medications. Ideally, narcotic medication use should last no more than 6-8 weeks (coinciding with fracture healing).   As a patient it is your responsibility as well to monitor narcotic medication use and report the amount and frequency you use these medications when you come to your office visit.   We would also advise that if you are using narcotic medications, you should take a dose prior to therapy to maximize you participation.  IF YOU ARE ON NARCOTIC MEDICATIONS IT IS NOT PERMISSIBLE TO OPERATE A MOTOR VEHICLE (MOTORCYCLE/CAR/TRUCK/MOPED) OR HEAVY MACHINERY DO NOT MIX NARCOTICS WITH OTHER CNS (CENTRAL NERVOUS SYSTEM) DEPRESSANTS SUCH AS ALCOHOL   POST-OPERATIVE  OPIOID TAPER INSTRUCTIONS: It is important to wean off of your opioid medication as soon as possible. If you do not need pain medication after your surgery it is ok to stop day one. Opioids include: Codeine, Hydrocodone (Norco, Vicodin), Oxycodone (Percocet, oxycontin ) and hydromorphone  amongst others.  Long term and even short term use of opiods can cause: Increased pain response Dependence Constipation Depression Respiratory depression And more.  Withdrawal symptoms can include Flu like symptoms Nausea, vomiting And more Techniques to manage these symptoms Hydrate well Eat regular healthy meals Stay active Use relaxation techniques(deep breathing, meditating, yoga) Do Not substitute Alcohol  to help with tapering If you have been on opioids for less than two weeks and do not have pain than it is ok to stop all together.  Plan to wean off of opioids This plan should start within one week post op of your fracture surgery  Maintain the same interval or time between taking each dose and first decrease the dose.  Cut the total daily intake of opioids by one tablet each day Next start to increase the time between doses. The last dose that should be eliminated is the evening dose.    STOP SMOKING OR USING NICOTINE PRODUCTS!!!!  As discussed nicotine severely impairs your body's ability to heal surgical and traumatic wounds but also impairs bone healing.  Wounds and bone heal by forming microscopic blood vessels (angiogenesis) and nicotine is a vasoconstrictor (essentially, shrinks blood vessels).  Therefore, if vasoconstriction occurs to these microscopic blood vessels they essentially disappear and are unable to deliver necessary nutrients to the healing tissue.  This is  one modifiable factor that you can do to dramatically increase your chances of healing your injury.  (This means no smoking, no nicotine gum, patches, etc)  DO NOT USE NONSTEROIDAL ANTI-INFLAMMATORY DRUGS (NSAID'S)  Using  products such as Advil (ibuprofen), Aleve  (naproxen ), Motrin (ibuprofen) for additional pain control during fracture healing can delay and/or prevent the healing response.  If you would like to take over the counter (OTC) medication, Tylenol  (acetaminophen ) is ok.  However, some narcotic medications that are given for pain control contain acetaminophen  as well. Therefore, you should not exceed more than 4000 mg of tylenol  in a day if you do not have liver disease.  Also note that there are may OTC medicines, such as cold medicines and allergy medicines that my contain tylenol  as well.  If you have any questions about medications and/or interactions please ask your doctor/PA or your pharmacist.      ICE AND ELEVATE INJURED/OPERATIVE EXTREMITY  Using ice and elevating the injured extremity above your heart can help with swelling and pain control.  Icing in a pulsatile fashion, such as 20 minutes on and 20 minutes off, can be followed.    Do not place ice directly on skin. Make sure there is a barrier between to skin and the ice pack.    Using frozen items such as frozen peas works well as the conform nicely to the are that needs to be iced.  USE AN ACE WRAP OR TED HOSE FOR SWELLING CONTROL  In addition to icing and elevation, Ace wraps or TED hose are used to help limit and resolve swelling.  It is recommended to use Ace wraps or TED hose until you are informed to stop.    When using Ace Wraps start the wrapping distally (farthest away from the body) and wrap proximally (closer to the body)   Example: If you had surgery on your leg or thing and you do not have a splint on, start the ace wrap at the toes and work your way up to the thigh        If you had surgery on your upper extremity and do not have a splint on, start the ace wrap at your fingers and work your way up to the upper arm   CALL THE OFFICE FOR MEDICATION REFILLS OR WITH ANY QUESTIONS/CONCERNS: [phone]   VISIT OUR WEBSITE FOR  ADDITIONAL INFORMATION: [email or web address]   Discharge Wound Care Instructions  Do NOT apply any ointments, solutions or lotions to pin sites or surgical wounds.  These prevent needed drainage and even though solutions like hydrogen peroxide kill bacteria, they also damage cells lining the pin sites that help fight infection.  Applying lotions or ointments can keep the wounds moist and can cause them to breakdown and open up as well. This can increase the risk for infection. When in doubt call the office.  Surgical incisions should be dressed daily.  If any drainage is noted, use one layer of adaptic or Mepitel, then gauze, Kerlix, and an ace wrap. - These dressing supplies should be available at local medical supply stores ([facility], [facility], etc) as well as Insurance claims handler ([facility], [facility], [employer], etc)  Once the incision is completely dry and without drainage, it may be left open to air out.  Showering may begin 36-48 hours later.  Cleaning gently with soap and water.  Call office for the following: Temperature greater than 101F Persistent nausea and vomiting Severe uncontrolled pain Redness, tenderness, or signs of infection (  pain, swelling, redness, odor or green/yellow discharge around the site) Difficulty breathing, headache or visual disturbances Hives Persistent dizziness or light-headedness Extreme fatigue Any other questions or concerns you may have after discharge  In an emergency, call 911 or go to an Emergency Department at a nearby hospital  OTHER HELPFUL INFORMATION  If you had a block, it will wear off between 8-24 hrs postop typically.  This is period when your pain may go from nearly zero to the pain you would have had postop without the block.  This is an abrupt transition but nothing dangerous is happening.  You may take an extra dose of narcotic when this happens.  You should wean off your narcotic medicines as soon as you are able.  Most patients  will be off or using minimal narcotics before their first postop appointment.   We suggest you use the pain medication the first night prior to going to bed, in order to ease any pain when the anesthesia wears off. You should avoid taking pain medications on an empty stomach as it will make you nauseous.  Do not drink alcoholic beverages or take illicit drugs when taking pain medications.  In most states it is against the law to drive while you are in a splint or sling.  And certainly against the law to drive while taking narcotics.  You may return to work/school in the next couple of days when you feel up to it.   Pain medication may make you constipated.  Below are a few solutions to try in this order: Decrease the amount of pain medication if you aren't having pain. Drink lots of decaffeinated fluids. Drink prune juice and/or each dried prunes  If the first 3 don't work start with additional solutions Take Colace - an over-the-counter stool softener Take Senokot - an over-the-counter laxative Take Miralax  - a stronger over-the-counter laxative

## 2023-10-21 NOTE — Transfer of Care (Signed)
 Immediate Anesthesia Transfer of Care Note  Patient: [name]  Procedure(s) Performed: REMOVAL, HARDWARE PROXIMAL TIBIA (Left)  Patient Location: PACU  Anesthesia Type:General  Level of Consciousness: awake, alert , and oriented  Airway & Oxygen Therapy: Patient Spontanous Breathing and Patient connected to nasal cannula oxygen  Post-op Assessment: Report given to RN and Post -op Vital signs reviewed and stable  Post vital signs: Reviewed and stable  Last Vitals:  Vitals Value Taken Time  BP 161/81 10/21/23 08:46  Temp    Pulse 80 10/21/23 08:51  Resp 17 10/21/23 08:51  SpO2 94 % 10/21/23 08:51  Vitals shown include unfiled device data.  Last Pain:  Vitals:   10/21/23 0606  TempSrc:   PainSc: 4       Patients Stated Pain Goal: 1 (10/21/23 0606)  Complications: No notable events documented.

## 2023-10-21 NOTE — Op Note (Signed)
 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  Post-Op Diagnosis: Same  Procedures: CPT 20680-Removal of hardware left proximal tibia  Surgeons : Primary: Kendal Franky SQUIBB, MD  Assistant: Lauraine Moores, PA-C  Location: OR 3   Anesthesia: General   Antibiotics: Ancef  2g preop with 1 gm vancomycin  powder placed topically   Tourniquet time: None    Estimated Blood Loss: 50 mL  Complications:* No complications entered in OR log *   Specimens:* No specimens in log *   Implants: * No implants in log *   Indications for Surgery: 78 year old female who sustained a left bicondylar tibial plateau fracture with significant medial joint involvement.  I initially taken her for surgery with open reduction internal fixation in May 2024.  She subsequently had some loss of fixation and displacement of her posterior medial fracture that required revision fixation in June of the same year.  She subsequently was able to ambulate without assist device but she continued to have significant pain and x-ray showed progression of arthritic changes in her medial compartment consistent with posttraumatic arthritis.  And planning for potential total knee arthroplasty I recommend proceeding with removal of hardware from her proximal tibia.  Risks and benefits were discussed with the patient.  Risks include but not limited to bleeding, infection, refracture, continued pain, nerve and blood vessel injury, even the possibility anesthetic complications.  She agreed to proceed with surgery and consent was obtained.  Operative Findings: 1.  Successful removal of hardware from left proximal tibia fracture without complication.  Procedure: The patient was identified in the preoperative holding area. Consent was confirmed with the patient and their family and all questions  were answered. The operative extremity was marked after confirmation with the patient. she was then brought back to the operating room by our anesthesia colleagues.  She was carefully transferred over to radiolucent flattop table.  She was placed under general anesthetic.  The left lower extremity was then prepped and draped in usual sterile fashion.  A timeout was performed to verify the patient, the procedure, and the extremity.  Preoperative antibiotics were dosed.  Fluoroscopic imaging showed the hardware that was in place.  I reopened the medial incision and carried it down through skin subcutaneous tissue.  I incised through the scar tissue to visualize and expose the plate and screws.  I was able to successfully remove all of the screws without difficulty.  I then elevated the plates off to remove those.  These plates came off without any difficulty.  I then percutaneously made incision anteriorly and proceeded to remove the anterior to posterior screw and once I did this all the hardware was removed.  Final fluoroscopic imaging was obtained.  The incision was copiously irrigated.  A gram of vancomycin  powder was placed into the incision.  A layered closure of 0 Vicryl, 2-0 Monocryl and 3-0 Monocryl with Steri-Strips were used to close the skin.  Sterile dressings were applied.  Patient was then awoke from anesthesia and taken to the PACU in stable condition.  Post Op Plan/Instructions: The patient will be weightbearing as tolerated to the left lower extremity.  She will be restarted on aspirin  postoperatively.  She will discharge home and follow-up in approximately 2 weeks for x-rays and wound check.  I was present and performed the entire surgery.  Lauraine Moores, PA-C did assist me throughout the case. An  assistant was necessary given the difficulty in approach, maintenance of reduction and ability to instrument the fracture.   Franky Light, MD [facility]

## 2023-10-22 ENCOUNTER — Encounter (HOSPITAL_COMMUNITY): Payer: Self-pay | Admitting: Student

## 2023-10-25 DIAGNOSIS — E119 Type 2 diabetes mellitus without complications: Secondary | ICD-10-CM | POA: Diagnosis not present

## 2023-11-01 DIAGNOSIS — S82142D Displaced bicondylar fracture of left tibia, subsequent encounter for closed fracture with routine healing: Secondary | ICD-10-CM | POA: Diagnosis not present

## 2023-11-01 DIAGNOSIS — T8484XD Pain due to internal orthopedic prosthetic devices, implants and grafts, subsequent encounter: Secondary | ICD-10-CM | POA: Diagnosis not present

## 2023-11-04 DIAGNOSIS — S62337A Displaced fracture of neck of fifth metacarpal bone, left hand, initial encounter for closed fracture: Secondary | ICD-10-CM | POA: Diagnosis not present

## 2023-11-04 DIAGNOSIS — M79642 Pain in left hand: Secondary | ICD-10-CM | POA: Diagnosis not present

## 2023-11-14 DIAGNOSIS — N952 Postmenopausal atrophic vaginitis: Secondary | ICD-10-CM | POA: Diagnosis not present

## 2023-11-14 DIAGNOSIS — Z01419 Encounter for gynecological examination (general) (routine) without abnormal findings: Secondary | ICD-10-CM | POA: Diagnosis not present

## 2023-11-14 DIAGNOSIS — Z6833 Body mass index (BMI) 33.0-33.9, adult: Secondary | ICD-10-CM | POA: Diagnosis not present

## 2023-11-15 DIAGNOSIS — M25562 Pain in left knee: Secondary | ICD-10-CM | POA: Diagnosis not present

## 2023-11-16 ENCOUNTER — Telehealth: Payer: Self-pay

## 2023-11-16 NOTE — Telephone Encounter (Signed)
 Preop tele appt now scheduled, med rec and consent done.

## 2023-11-16 NOTE — Telephone Encounter (Signed)
  Patient Consent for Virtual Visit        [name] has provided verbal consent on 11/16/2023 for a virtual visit (video or telephone).   CONSENT FOR VIRTUAL VISIT FOR:  [name]  By participating in this virtual visit I agree to the following:  I hereby voluntarily request, consent and authorize [facility] and its employed or contracted physicians, physician assistants, nurse practitioners or other licensed health care professionals (the Practitioner), to provide me with telemedicine health care services (the "Services) as deemed necessary by the treating Practitioner. I acknowledge and consent to receive the Services by the Practitioner via telemedicine. I understand that the telemedicine visit will involve communicating with the Practitioner through live audiovisual communication technology and the disclosure of certain medical information by electronic transmission. I acknowledge that I have been given the opportunity to request an in-person assessment or other available alternative prior to the telemedicine visit and am voluntarily participating in the telemedicine visit.  I understand that I have the right to withhold or withdraw my consent to the use of telemedicine in the course of my care at any time, without affecting my right to future care or treatment, and that the Practitioner or I may terminate the telemedicine visit at any time. I understand that I have the right to inspect all information obtained and/or recorded in the course of the telemedicine visit and may receive copies of available information for a reasonable fee.  I understand that some of the potential risks of receiving the Services via telemedicine include:  Delay or interruption in medical evaluation due to technological equipment failure or disruption; Information transmitted may not be sufficient (e.g. poor resolution of images) to allow for appropriate medical decision making by the  Practitioner; and/or  In rare instances, security protocols could fail, causing a breach of personal health information.  Furthermore, I acknowledge that it is my responsibility to provide information about my medical history, conditions and care that is complete and accurate to the best of my ability. I acknowledge that Practitioner's advice, recommendations, and/or decision may be based on factors not within their control, such as incomplete or inaccurate data provided by me or distortions of diagnostic images or specimens that may result from electronic transmissions. I understand that the practice of medicine is not an exact science and that Practitioner makes no warranties or guarantees regarding treatment outcomes. I acknowledge that a copy of this consent can be made available to me via my patient portal [facility] MyChart), or I can request a printed copy by calling the office of [facility].    I understand that my insurance will be billed for this visit.   I have read or had this consent read to me. I understand the contents of this consent, which adequately explains the benefits and risks of the Services being provided via telemedicine.  I have been provided ample opportunity to ask questions regarding this consent and the Services and have had my questions answered to my satisfaction. I give my informed consent for the services to be provided through the use of telemedicine in my medical care

## 2023-11-16 NOTE — Telephone Encounter (Signed)
   Name: [name]  DOB: [birth year]  MRN: [id number]  Primary Cardiologist: Jayson Sierras, MD   Preoperative team, please contact this patient and set up a phone call appointment for further preoperative risk assessment. Please obtain consent and complete medication review. Thank you for your help.  I confirm that guidance regarding antiplatelet and oral anticoagulation therapy has been completed and, if necessary, noted below.  Regarding ASA therapy,  it may be stopped 5-7 days prior to surgery with a plan to resume it as soon as felt to be feasible from a surgical standpoint in the post-operative period.   I also confirmed the patient resides in the state of [place] . As per [facility] Medical Board telemedicine laws, the patient must reside in the state in which the provider is licensed.   Wyn Raddle, Jackee Shove, NP 11/16/2023, 11:57 AM [facility]

## 2023-11-16 NOTE — Telephone Encounter (Signed)
   Pre-operative Risk Assessment    Patient Name: [name]  DOB: 07-18-1945 MRN: [id number]   Date of last office visit: 05/04/23 JAYSON SIERRAS, MD Date of next office visit: NONE   Request for Surgical Clearance    Procedure:  LT TOTAL KNEE ARTHROPLASTY  Date of Surgery:  Clearance TBD                                Surgeon:  TORIBIO LOOSE, MD Surgeon's Group or Practice Name:  [facility] Phone number:  [phone]  [phone] Fax number:  [phone]   ATTN: KELLY HIGH   Type of Clearance Requested:   - Medical  - Pharmacy:  Hold Aspirin      Type of Anesthesia:  Spinal   Additional requests/questions:    SignedLucie DELENA Ku   11/16/2023, 8:20 AM

## 2023-11-18 ENCOUNTER — Telehealth: Payer: Self-pay | Admitting: Internal Medicine

## 2023-11-18 NOTE — Telephone Encounter (Signed)
 Fax received from Dr. Toribio Higashi with [facility] to perform a left total knee arthroplsty on patient.  Patient needs surgery clearance. Surgery is pending. Patient was seen on 02/21/23. Office protocol is a risk assessment can be sent to surgeon if patient has been seen in 60 days or less.    Needs appt with CY or APP for risk assessment.  Called and spoke with the pt and got her scheduled with Dr Neysa for 12/06/23 at 9:45 am. Routing back to clearance pool until visit has been completed.

## 2023-11-30 ENCOUNTER — Ambulatory Visit: Attending: Cardiovascular Disease

## 2023-11-30 DIAGNOSIS — Z01810 Encounter for preprocedural cardiovascular examination: Secondary | ICD-10-CM | POA: Diagnosis not present

## 2023-11-30 NOTE — Progress Notes (Signed)
 Virtual Visit via Telephone Note   Because of Ayanni H Dambrosio co-morbid illnesses, she is at least at moderate risk for complications without adequate follow up.  This format is felt to be most appropriate for this patient at this time.  Due to technical limitations with video connection (technology), today's appointment will be conducted as an audio only telehealth visit, and [employer] verbally agreed to proceed in this manner.   All issues noted in this document were discussed and addressed.  No physical exam could be performed with this format.  Evaluation Performed:  Preoperative cardiovascular risk assessment _____________   Date:  11/30/2023   Patient ID:  [name], DOB Aug 20, 1945, MRN [id number] Patient Location:  Home Provider location:   Office  Primary Care Provider:  Shepard Ade, MD Primary Cardiologist:  Jayson Sierras, MD  Chief Complaint / Patient Profile   78 y.o. y/o female with a h/o dyspnea on exertion, hypertension, CVA, hyperlipidemia, OSA on CPAP who is pending left total knee arthroplasty and presents today for telephonic preoperative cardiovascular risk assessment.  History of Present Illness    [name] is a 78 y.o. female who presents via Web designer for a telehealth visit today.  Pt was last seen in cardiology clinic on 05/04/2023 by Dr. Sierras.  At that time [employer] was doing well but still struggling with some DOE.  Because of this, an echocardiogram and Lexiscan  Myoview  was ordered at that visit..  The patient is now pending procedure as outlined above. Since her last visit, she  underwent a stress test was normal without any ischemic changes.  Echocardiogram showed hyperdynamic LVEF of 75%, mild diastolic dysfunction, mild aortic regurgitation.  Overall reassuring and unremarkable. (March 2025). She is in an apartment.   Regarding ASA therapy, it may be stopped 5-7 days prior to surgery with a plan to  resume it as soon as felt to be feasible from a surgical standpoint in the post-operative period.   Echo 05/23/23  IMPRESSIONS     1. Left ventricular ejection fraction, by estimation, is >75%. The left  ventricle has hyperdynamic function. The left ventricle has no regional  wall motion abnormalities. Left ventricular diastolic parameters are  consistent with Grade I diastolic  dysfunction (impaired relaxation).   2. Right ventricular systolic function is normal. The right ventricular  size is normal. Tricuspid regurgitation signal is inadequate for assessing  PA pressure.   3. The mitral valve is normal in structure. Mild mitral valve  regurgitation. No evidence of mitral stenosis.   4. The aortic valve is tricuspid. Aortic valve regurgitation is mild. No  aortic stenosis is present.   5. The inferior vena cava is normal in size with greater than 50%  respiratory variability, suggesting right atrial pressure of 3 mmHg.   6. Increased flow velocities may be secondary to anemia, thyrotoxicosis,  hyperdynamic or high flow state.   Comparison(s): No significant change from prior study.   FINDINGS   Left Ventricle: Left ventricular ejection fraction, by estimation, is  >75%. The left ventricle has hyperdynamic function. The left ventricle has  no regional wall motion abnormalities. Strain was performed and the global  longitudinal strain is  indeterminate. The left ventricular internal cavity size was normal in  size. There is no left ventricular hypertrophy. Left ventricular diastolic  parameters are consistent with Grade I diastolic dysfunction (impaired  relaxation). Normal left ventricular  filling pressure.   Right Ventricle: The right ventricular size is  normal. No increase in  right ventricular wall thickness. Right ventricular systolic function is  normal. Tricuspid regurgitation signal is inadequate for assessing PA  pressure.   Left Atrium: Left atrial size was normal  in size.   Right Atrium: Right atrial size was normal in size.   Pericardium: There is no evidence of pericardial effusion.   Mitral Valve: The mitral valve is normal in structure. Mild mitral valve  regurgitation. No evidence of mitral valve stenosis.   Tricuspid Valve: The tricuspid valve is normal in structure. Tricuspid  valve regurgitation is trivial. No evidence of tricuspid stenosis.   Aortic Valve: The aortic valve is tricuspid. Aortic valve regurgitation is  mild. No aortic stenosis is present.   Pulmonic Valve: The pulmonic valve was normal in structure. Pulmonic valve  regurgitation is trivial. No evidence of pulmonic stenosis.   Aorta: The aortic root is normal in size and structure.   Venous: The inferior vena cava is normal in size with greater than 50%  respiratory variability, suggesting right atrial pressure of 3 mmHg.   IAS/Shunts: No atrial level shunt detected by color flow Doppler.   Additional Comments: 3D was performed not requiring image post processing  on an independent workstation and was indeterminate.   Past Medical History    Past Medical History:  Diagnosis Date   Anxiety    Arthritis    Depression    DVT (deep venous thrombosis) (HCC) 08/02/2022   LLE 08/02/22   Essential hypertension    Eustachian tube dysfunction    Fibromyalgia    GERD (gastroesophageal reflux disease)    Hyperlipidemia    Hypothyroid    Mild carotid artery disease (HCC)    OSA (obstructive sleep apnea)    uses CPAP nightly   Osteopenia    Pneumonia    x several   Stroke (HCC) 2016   Type 2 diabetes mellitus (HCC)    Past Surgical History:  Procedure Laterality Date   ANKLE SURGERY     Anteriorvesicourethropexy     BUNIONECTOMY     CHOLECYSTECTOMY     COLONOSCOPY     HARDWARE REMOVAL Left 10/21/2023   Procedure: REMOVAL, HARDWARE PROXIMAL TIBIA;  Surgeon: Kendal Franky SQUIBB, MD;  Location: [facility] OR;  Service: Orthopedics;  Laterality: Left;   NASAL SINUS SURGERY      ORIF TIBIA PLATEAU Left 07/30/2022   Procedure: OPEN REDUCTION INTERNAL FIXATION (ORIF) TIBIAL PLATEAU;  Surgeon: Kendal Franky SQUIBB, MD;  Location: [facility] OR;  Service: Orthopedics;  Laterality: Left;   ORIF TIBIA PLATEAU Left 09/02/2022   Procedure: REVISION FIXATION OF LEFT TIBIAL PLATEAU FRACTURE;  Surgeon: Kendal Franky SQUIBB, MD;  Location: [facility] OR;  Service: Orthopedics;  Laterality: Left;   PATELLA FRACTURE SURGERY  03/23/2007   Right   TONSILLECTOMY     TOTAL ABDOMINAL HYSTERECTOMY     WRIST FRACTURE SURGERY  03/23/2007    Allergies  Allergies  Allergen Reactions   Hydrochlorothiazide Itching   Oxycodone  Itching    Home Medications    Prior to Admission medications   Medication Sig Start Date End Date Taking? Authorizing Provider  acetaminophen  (TYLENOL ) 500 MG tablet Take 500 mg by mouth every 6 (six) hours as needed for moderate pain (pain score 4-6). 09/28/22   [provider]  amLODipine  (NORVASC ) 5 MG tablet Take 5 mg by mouth daily. 08/30/20   [provider]  aspirin  EC (ADULT ASPIRIN  REGIMEN) 81 MG tablet Take 81 mg by mouth every evening. 08/17/19  [provider]  cetirizine  (ZYRTEC ) 10 MG tablet Take 1 tablet (10 mg total) by mouth daily. Patient taking differently: Take 10 mg by mouth daily as needed for allergies. 12/31/22   Leath-Warren, Etta PARAS, NP  Cholecalciferol (VITAMIN D ) 50 MCG (2000 UT) tablet Take 2,000 Units by mouth daily.    [provider]  Cyanocobalamin  (VITAMIN B-12) 5000 MCG SUBL Place 5,000 mcg under the tongue daily.    [provider]  escitalopram (LEXAPRO) 10 MG tablet Take 10 mg by mouth in the morning. 06/06/23   [provider]  insulin  degludec (TRESIBA FLEXTOUCH) 200 UNIT/ML FlexTouch Pen Inject 32 Units into the skin daily. 11/19/22   [provider]  insulin  lispro (HUMALOG KWIKPEN) 100 UNIT/ML KwikPen Inject 3-5 Units into the skin 3 (three) times daily with meals. Sliding scale     [provider]  irbesartan  (AVAPRO ) 300 MG tablet Take 300 mg by mouth daily.    [provider]  MAGNESIUM  COMPLEX HIGH POTENCY PO Take 300 mg by mouth at bedtime.    [provider]  methocarbamol  (ROBAXIN ) 500 MG tablet Take 1 tablet (500 mg total) by mouth every 6 (six) hours as needed. 10/21/23   Danton Lauraine LABOR, PA-C  metoprolol  succinate (TOPROL -XL) 50 MG 24 hr tablet Take 50 mg by mouth every evening. 09/30/20   [provider]  Multiple Vitamins-Minerals (HAIR SKIN & NAILS) TABS Take 6,000 mcg by mouth daily.    [provider]  Multiple Vitamins-Minerals (MULTIVITAMIN WITH MINERALS) tablet Take 1 tablet by mouth daily. Alive woman 50+    [provider]  omeprazole (PRILOSEC) 20 MG capsule Take 20 mg by mouth daily. 12/28/19   [provider]  rosuvastatin  (CRESTOR ) 20 MG tablet Take 20 mg by mouth at bedtime. 02/08/19   [provider]  Semaglutide, 1 MG/DOSE, (OZEMPIC, 1 MG/DOSE,) 4 MG/3ML SOPN Inject 1 mg into the skin once a week. Takes on friday 11/19/22   [provider]  traMADol  (ULTRAM ) 50 MG tablet Take 1-2 tablets (50-100 mg total) by mouth every 6 (six) hours as needed for severe pain (pain score 7-10). 10/21/23   Danton Lauraine LABOR, PA-C  triamterene -hydrochlorothiazide (MAXZIDE-25) 37.5-25 MG tablet Take 1 tablet by mouth daily. 08/22/22   [provider]    Physical Exam    Vital Signs:  Hiral H Arcia does not have vital signs available for review today.  Given telephonic nature of communication, physical exam is limited. AAOx3. NAD. Normal affect.  Speech and respirations are unlabored.  Accessory Clinical Findings    None  Assessment & Plan    1.  Preoperative Cardiovascular Risk Assessment:  Ms. Scism's perioperative risk of a major cardiac event is 0.9% according to the Revised Cardiac Risk Index (RCRI).  Therefore, she is at low risk for perioperative complications.   Her  functional capacity is fair at 4.06 METs according to the Duke Activity Status Index (DASI). Recommendations: According to ACC/AHA guidelines, no further cardiovascular testing needed.  The patient may proceed to surgery at acceptable risk.   Antiplatelet and/or Anticoagulation Recommendations: Aspirin  can be held for 5-7 days prior to her surgery.  Please resume Aspirin  post operatively when it is felt to be safe from a bleeding standpoint.   The patient was advised that if she develops new symptoms prior to surgery to contact our office to arrange for a follow-up visit, and she verbalized understanding.   A copy of this note will be routed  to requesting surgeon.  Time:   Today, I have spent 9 minutes with the patient with telehealth technology discussing medical history, symptoms, and management plan.     Orren LOISE Fabry, PA-C  11/30/2023, 7:57 AM

## 2023-12-04 NOTE — Progress Notes (Signed)
 Patient ID: [name], female    DOB: 1945-10-11, 78 y.o.   MRN: [id number]  HPI F followed for sleep apnea, allergic rhinitis, hx of lung nodule. NPSG 03/05/07- AHI 31.7/ hr, desaturation to 85%, CPAP to 14, body weight 195 lbs PFT 02/17/21- WNL --------------------------------------------------------------------------------    02/21/23- 78 year old female never smoker followed for OSA/CPAP, DOE, allergic rhinitis, prior lung nodule, complicated by DM 2, GERD, HBP, hypothyroid, CVA/ plavix CPAP auto 8-15/    AirSense 10 AutoSet Download-compliance -77%, AHI 2.9/hr Body weight today-176 lbs ED in Oct with acute pansinusitis. Doppler for left leg pain Neg for DVT in Nov. Had flu vax. Insurance apparently changed her from [facility] to [employer] for supplies and to [facility] for local care.  Her machine is old and we think we are supposed to go through [facility] for replacement.  Her original sleep study in 2008 is no longer available in EMR.  She has been very compliant and sleeps better with CPAP.  Needs mask refitting. There is old history of lung nodule but no concern on 1 view CXR 07/29/2022.  12/06/23- 78 year old female never smoker followed for OSA/CPAP, DOE, allergic rhinitis, prior lung nodule, complicated by DM 2, GERD, HBP, hypothyroid, CVA/ plavix CPAP auto 8-15/    AirSense 10 AutoSet Download-compliance -97%, AHI 3.7/hr Body weight today-187 lbs Discussed the use of AI scribe software for clinical note transcription with the patient, who gave verbal consent to proceed.  History of Present Illness   [name] is a 78 year old female who presents for follow-up regarding potential knee replacement surgery. This is clearance visit. She is doing well with no acute concerns.  She uses a CPAP machine without concerns and experiences easy breathing. No new or different respiratory symptoms are present. Sleep disturbances are occasionally managed  with Benadryl  or Tylenol  PM.     Assessment and Plan:    Obstructive sleep apnea Well-controlled with CPAP therapy. - Continue CPAP therapy. -Advise use of CPAP and careful respiratory monitoring in Recovery room after knee surgery. -Clear to proceed with knee surgery  Knee osteoarthritis Chronic condition with recent aggravation. Knee replacement planned pending healing from August surgery. - Coordinate with Dr. Kendal for knee replacement surgery. - Monitor for paperwork from [facility] office regarding the surgery.  Insomnia Prefers non-prescription aids. - Consider using Tylenol  PM for sleep as needed.      Review of Systems-See HPI  + = positive Constitutional:   No-   weight loss, , fevers, chills, fatigue, lassitude. HEENT:   Mild  headaches, No-difficulty swallowing, tooth/dental problems, sore throat,       No-  sneezing, itching, ear ache,   +nasal congestion, +post nasal drip- improved,  CV:  No-   chest pain, orthopnea, PND, swelling in lower extremities, anasarca, dizziness, palpitations Resp: +shortness of breath with exertion or at rest.              No-   productive cough,  No non-productive cough,  No-  coughing up of blood.              No-   change in color of mucus.  No- wheezing.   Skin: No-   rash or lesions. GI:  No-   heartburn, indigestion, abdominal pain, nausea, vomiting,  GU: . MS:  +   joint pain or swelling.  Neuro- nothing unusual:  Psych:  No- change in mood or affect. No depression or anxiety.  No  memory loss.  Objective:   Physical Exam General- Alert, Oriented, Affect-appropriate, Distress- none acute, +overweight Skin- rash-none, lesions- none, excoriation- none Lymphadenopathy- none Head- atraumatic            Eyes- Gross vision intact, PERRLA, conjunctivae clear secretions            Ears- Hearing, canals-normal            Nose- turbinate edema, no-Septal dev, mucus, polyps, erosion, perforation             Throat- Mallampati III  , mucosa Dry, drainage- none, tonsils- atrophic Neck- flexible , trachea midline, no stridor , thyroid  nl, carotid no bruit Chest - symmetrical excursion , unlabored           Heart/CV- RRR ,  +Murmur 1/6 systolic AS , no gallop  , no rub, nl s1 s2                           - JVD- none , edema- none, stasis changes- none, varices- none           Lung- clear to P&A, wheeze- none, cough- none , dullness-none, rub- none           Chest wall-  Abd-  Br/ Gen/ Rectal- Not done, not indicated Extrem- cyanosis- none, clubbing, none, atrophy- none, strength- nl. Neuro- grossly intact to observation

## 2023-12-06 ENCOUNTER — Encounter: Payer: Self-pay | Admitting: Internal Medicine

## 2023-12-06 ENCOUNTER — Ambulatory Visit: Admitting: Internal Medicine

## 2023-12-06 VITALS — BP 116/64 | HR 74 | Temp 97.9°F | Ht 60.0 in | Wt 187.6 lb

## 2023-12-06 DIAGNOSIS — G4700 Insomnia, unspecified: Secondary | ICD-10-CM | POA: Diagnosis not present

## 2023-12-06 DIAGNOSIS — G4733 Obstructive sleep apnea (adult) (pediatric): Secondary | ICD-10-CM

## 2023-12-06 DIAGNOSIS — M179 Osteoarthritis of knee, unspecified: Secondary | ICD-10-CM | POA: Diagnosis not present

## 2023-12-06 NOTE — Patient Instructions (Addendum)
 Glad your sleep and CPAP are doing ok. You can choose to try Tylenol  PM if you want.  You are clear from a Pulmonary standpoint to go ahead with planned knee surgery.  Please call if we can help

## 2023-12-08 NOTE — Telephone Encounter (Signed)
 Last OV note has been faxed to [facility]. Received successful fax confirmation.  Nothing further needed.

## 2023-12-10 ENCOUNTER — Encounter: Payer: Self-pay | Admitting: Internal Medicine

## 2023-12-14 DIAGNOSIS — M25511 Pain in right shoulder: Secondary | ICD-10-CM | POA: Diagnosis not present

## 2023-12-27 DIAGNOSIS — Z794 Long term (current) use of insulin: Secondary | ICD-10-CM | POA: Diagnosis not present

## 2023-12-27 DIAGNOSIS — K760 Fatty (change of) liver, not elsewhere classified: Secondary | ICD-10-CM | POA: Diagnosis not present

## 2023-12-27 DIAGNOSIS — I10 Essential (primary) hypertension: Secondary | ICD-10-CM | POA: Diagnosis not present

## 2023-12-27 DIAGNOSIS — E1169 Type 2 diabetes mellitus with other specified complication: Secondary | ICD-10-CM | POA: Diagnosis not present

## 2023-12-27 DIAGNOSIS — I25118 Atherosclerotic heart disease of native coronary artery with other forms of angina pectoris: Secondary | ICD-10-CM | POA: Diagnosis not present

## 2023-12-27 DIAGNOSIS — Z23 Encounter for immunization: Secondary | ICD-10-CM | POA: Diagnosis not present

## 2023-12-29 DIAGNOSIS — M1732 Unilateral post-traumatic osteoarthritis, left knee: Secondary | ICD-10-CM | POA: Diagnosis not present

## 2023-12-29 DIAGNOSIS — M25562 Pain in left knee: Secondary | ICD-10-CM | POA: Diagnosis not present

## 2023-12-29 DIAGNOSIS — G8929 Other chronic pain: Secondary | ICD-10-CM | POA: Diagnosis not present

## 2024-01-03 ENCOUNTER — Telehealth: Payer: Self-pay

## 2024-01-03 NOTE — Progress Notes (Signed)
 Sent message, via epic in basket, requesting orders in epic from Careers adviser.

## 2024-01-03 NOTE — Telephone Encounter (Addendum)
 2026 Renewal  PAP: Patient assistance application for Novolog  and Tresiba  through [employer] has been mailed to pt's home address on file. Provider portion of application will be faxed to provider's office.   Provider portion of application has been faxed to Dr. Charlie Love at [facility]

## 2024-01-05 DIAGNOSIS — Z780 Asymptomatic menopausal state: Secondary | ICD-10-CM | POA: Diagnosis not present

## 2024-01-08 NOTE — Progress Notes (Signed)
 COVID Vaccine received:  []  No [x]  Yes Date of any COVID positive Test in last 90 days:  PCP - Charlie Love, MD  Cardiologist - Sheppard Sierras, MD Orren Fabry, PA-C cardiac clearance in 11-30-23 note Pulmonology- Reggy Salt, MD  LOV 12-06-23 clearance in Epic  Chest x-ray - 07-29-2022  1v  EKG -  10-21-2023  Stress Test - Myoview  05-23-2023  ECHO - 05-23-2023   Cardiac Cath -  CT Coronary Calcium  score:   Pacemaker / ICD device [x]  No []  Yes   Spinal Cord Stimulator:[x]  No []  Yes       History of Sleep Apnea? []  No [x]  Yes   CPAP used?- []  No [x]  Yes    Patient has: []  NO Hx DM   []  Pre-DM   []  DM1  [x]   DM2 Does the patient monitor blood sugar?   []  N/A   []  No [x]  Yes  Last A1c was: 6.5 on  12-27-23 Does patient have a [employer] or Dexcom? []  No []  Yes   Fasting Blood Sugar Ranges-  Checks Blood Sugar _____ times a day  Blood Thinner / Instructions:  none Aspirin  Instructions:  ASA 81 mg -  already on hold?  Dental hx: []  Dentures:  []  N/A      []  Bridge or Partial:                   []  Loose or Damaged teeth:   Comments:   Activity level: Able to walk up 2 flights of stairs without becoming significantly short of breath or having chest pain?  []  No   []    Yes  Patient can perform ADLs without assistance. []  No   []   Yes  Anesthesia review: DM2, HTN, GERD, Hx DVT in LLE 08-02-2022, Hx CVA 2016,  Hx lung nodule, stable, DOE, OSA-CPAP  Patient denies any S&S of respiratory illness or Covid - no shortness of breath, fever, cough or chest pain at PAT appointment.  Patient verbalized understanding and agreement to the Pre-Surgical Instructions that were given to them at this PAT appointment. Patient was also educated of the need to review these PAT instructions again prior to her surgery.I reviewed the appropriate phone numbers to call if they have any and questions or concerns.

## 2024-01-09 ENCOUNTER — Ambulatory Visit: Payer: Self-pay | Admitting: Emergency Medicine

## 2024-01-09 DIAGNOSIS — G8929 Other chronic pain: Secondary | ICD-10-CM

## 2024-01-09 NOTE — H&P (Signed)
 TOTAL KNEE ADMISSION H&P  Patient is being admitted for left total knee arthroplasty.  Subjective:  Chief Complaint:left knee pain.  HPI: [employer], 78 y.o. female, has a history of pain and functional disability in the left knee due to post-traumatic arthritis and has failed conservative treatments for greater than 12 weeks to includeNSAID's and/or analgesics, supervised PT with diminished ADL's post treatment, use of assistive devices, and activity modification.  Onset of symptoms was gradual, starting 1-2 years ago with gradually worsening course since that time. The patient noted prior procedures on the knee to include  ORIF, subsequent ORIF revision, and then subsequent ORIF hardware removal on the left knee(s).  Patient currently rates pain in the left knee(s) at 10 out of 10 with activity. Patient has night pain, worsening of pain with activity and weight bearing, pain that interferes with activities of daily living, and pain with passive range of motion.  Patient has evidence of periarticular osteophytes and joint space narrowing by imaging studies.  There is no active infection.  Patient Active Problem List   Diagnosis Date Noted   Closed fracture of left tibial plateau 09/02/2022   Left tibial fracture 07/29/2022   Dyspnea on exertion 09/15/2020   CVA (cerebral vascular accident) (HCC) 04/20/2019   Pain in left hip 10/05/2016   Pain in right hip 10/05/2016   Trochanteric bursitis, right hip 10/05/2016   Trochanteric bursitis, left hip 10/05/2016   Chronic insomnia 01/28/2011   Seasonal and perennial allergic rhinitis 05/28/2010   Lung nodule 05/28/2010   HYPERLIPIDEMIA 11/23/2009   G E R D 11/23/2009   EUSTACHIAN TUBE DYSFUNCTION 11/18/2009   HYPERTENSION 11/18/2009   Unspecified hypothyroidism 03/14/2007   Obstructive sleep apnea 03/14/2007   FIBROMYALGIA 03/14/2007   Past Medical History:  Diagnosis Date   Anxiety    Arthritis    Depression    DVT (deep venous  thrombosis) (HCC) 08/02/2022   LLE 08/02/22   Essential hypertension    Eustachian tube dysfunction    Fibromyalgia    GERD (gastroesophageal reflux disease)    Hyperlipidemia    Hypothyroid    Mild carotid artery disease    OSA (obstructive sleep apnea)    uses CPAP nightly   Osteopenia    Pneumonia    x several   Stroke (HCC) 2016   Type 2 diabetes mellitus (HCC)     Past Surgical History:  Procedure Laterality Date   ANKLE SURGERY     Anteriorvesicourethropexy     BUNIONECTOMY     CHOLECYSTECTOMY     COLONOSCOPY     HARDWARE REMOVAL Left 10/21/2023   Procedure: REMOVAL, HARDWARE PROXIMAL TIBIA;  Surgeon: Kendal Franky SQUIBB, MD;  Location: [facility] OR;  Service: Orthopedics;  Laterality: Left;   NASAL SINUS SURGERY     ORIF TIBIA PLATEAU Left 07/30/2022   Procedure: OPEN REDUCTION INTERNAL FIXATION (ORIF) TIBIAL PLATEAU;  Surgeon: Kendal Franky SQUIBB, MD;  Location: [facility] OR;  Service: Orthopedics;  Laterality: Left;   ORIF TIBIA PLATEAU Left 09/02/2022   Procedure: REVISION FIXATION OF LEFT TIBIAL PLATEAU FRACTURE;  Surgeon: Kendal Franky SQUIBB, MD;  Location: [facility] OR;  Service: Orthopedics;  Laterality: Left;   PATELLA FRACTURE SURGERY  03/23/2007   Right   TONSILLECTOMY     TOTAL ABDOMINAL HYSTERECTOMY     WRIST FRACTURE SURGERY  03/23/2007    Current Outpatient Medications  Medication Sig Dispense Refill Last Dose/Taking   acetaminophen  (TYLENOL ) 500 MG tablet Take 500 mg by mouth every 6 (  six) hours as needed for moderate pain (pain score 4-6).      amLODipine  (NORVASC ) 5 MG tablet Take 5 mg by mouth daily.      aspirin  EC (ADULT ASPIRIN  REGIMEN) 81 MG tablet Take 81 mg by mouth every evening.      cetirizine  (ZYRTEC ) 10 MG tablet Take 1 tablet (10 mg total) by mouth daily. (Patient not taking: Reported on 01/09/2024) 30 tablet 0    Cholecalciferol (VITAMIN D ) 50 MCG (2000 UT) tablet Take 2,000 Units by mouth daily.      Cyanocobalamin  (VITAMIN B12) 1000 MCG TABS Take 1,000 mcg by mouth  daily.      escitalopram (LEXAPRO) 10 MG tablet Take 10 mg by mouth in the morning.      insulin  degludec (TRESIBA FLEXTOUCH) 200 UNIT/ML FlexTouch Pen Inject 32 Units into the skin daily.      insulin  lispro (HUMALOG KWIKPEN) 100 UNIT/ML KwikPen Inject 8-10 Units into the skin 3 (three) times daily with meals. Sliding scale      irbesartan  (AVAPRO ) 300 MG tablet Take 300 mg by mouth daily.      MAGNESIUM  COMPLEX HIGH POTENCY PO Take 300 mg by mouth at bedtime.      methocarbamol  (ROBAXIN ) 500 MG tablet Take 1 tablet (500 mg total) by mouth every 6 (six) hours as needed. (Patient not taking: Reported on 01/09/2024) 15 tablet 0    metoprolol  succinate (TOPROL -XL) 50 MG 24 hr tablet Take 50 mg by mouth every evening.      Multiple Vitamins-Minerals (HAIR SKIN & NAILS) TABS Take 6,000 mcg by mouth daily.      Multiple Vitamins-Minerals (MULTIVITAMIN WITH MINERALS) tablet Take 1 tablet by mouth daily. Alive woman 50+      omeprazole (PRILOSEC) 20 MG capsule Take 20 mg by mouth daily.      rosuvastatin  (CRESTOR ) 20 MG tablet Take 20 mg by mouth at bedtime.      traMADol  (ULTRAM ) 50 MG tablet Take 1-2 tablets (50-100 mg total) by mouth every 6 (six) hours as needed for severe pain (pain score 7-10). (Patient not taking: Reported on 01/09/2024) 20 tablet 0    triamterene -hydrochlorothiazide (MAXZIDE-25) 37.5-25 MG tablet Take 1 tablet by mouth daily.      No current facility-administered medications for this visit.   Allergies  Allergen Reactions   Hydrochlorothiazide Itching   Oxycodone  Itching    Pt can tolerate with benadryl     Social History   Tobacco Use   Smoking status: Never   Smokeless tobacco: Never  Substance Use Topics   Alcohol  use: No    Family History  Problem Relation Age of Onset   Heart attack Mother    Colon cancer Father      Review of Systems  Musculoskeletal:  Positive for arthralgias.  All other systems reviewed and are negative.   Objective:  Physical  Exam Constitutional:      General: She is not in acute distress.    Appearance: Normal appearance. She is not ill-appearing.  HENT:     Head: Normocephalic and atraumatic.     Right Ear: External ear normal.     Left Ear: External ear normal.     Nose: Nose normal.     Mouth/Throat:     Mouth: Mucous membranes are moist.     Pharynx: Oropharynx is clear.  Eyes:     Extraocular Movements: Extraocular movements intact.     Conjunctiva/sclera: Conjunctivae normal.  Cardiovascular:     Rate and  Rhythm: Normal rate and regular rhythm.     Pulses: Normal pulses.  Pulmonary:     Effort: Pulmonary effort is normal.  Abdominal:     Palpations: Abdomen is soft.     Tenderness: There is no abdominal tenderness.  Musculoskeletal:        General: Tenderness present.     Cervical back: Normal range of motion and neck supple.  Skin:    General: Skin is warm and dry.  Neurological:     Mental Status: She is alert and oriented to person, place, and time. Mental status is at baseline.  Psychiatric:        Mood and Affect: Mood normal.        Behavior: Behavior normal.     Vital signs in last 24 hours: @VSRANGES @  Labs:   Estimated body mass index is 36.64 kg/m as calculated from the following:   Height as of 12/06/23: 5' (1.524 m).   Weight as of 12/06/23: 85.1 kg.   Imaging Review Plain radiographs demonstrate severe degenerative joint disease of the left knee(s). The overall alignment issignificant varus. The bone quality appears to be fair for age and reported activity level.      Assessment/Plan:  Post-traumatic severe arthritis, left knee   The patient history, physical examination, clinical judgment of the provider and imaging studies are consistent with severe post-traumatic arthritis of the left knee(s) and total knee arthroplasty is deemed medically necessary. The treatment options including medical management, injection therapy arthroscopy and arthroplasty were  discussed at length. The risks and benefits of total knee arthroplasty were presented and reviewed. The risks due to aseptic loosening, infection, stiffness, patella tracking problems, thromboembolic complications and other imponderables were discussed. The patient acknowledged the explanation, agreed to proceed with the plan and consent was signed. Patient is being admitted for inpatient treatment for surgery, pain control, PT, OT, prophylactic antibiotics, VTE prophylaxis, progressive ambulation and ADL's and discharge planning. The patient is planning to be discharged OPPT    Anticipated LOS equal to or greater than 2 midnights due to - Age 72 and older with one or more of the following:  - Obesity  - Expected need for hospital services (PT, OT, Nursing) required for safe  discharge  - Anticipated need for postoperative skilled nursing care or inpatient rehab  - Active co-morbidities: CAD, diabetes, HTN, HLD, fibromyalgia, DOE, depression, OSA, hearing loss, IBS, hypothyroidism, migraines, tinnitus, FALD, OAB, hx of DVT 2024, hx of CVA 2016 OR   - Unanticipated findings during/Post Surgery: None  - Patient is a high risk of re-admission due to: None

## 2024-01-09 NOTE — Patient Instructions (Addendum)
 SURGICAL WAITING ROOM VISITATION Patients having surgery or a procedure may have no more than 2 support people in the waiting area - these visitors may rotate in the visitor waiting room.   If the patient needs to stay at the hospital during part of their recovery, the visitor guidelines for inpatient rooms apply.  PRE-OP VISITATION  Pre-op nurse will coordinate an appropriate time for 1 support person to accompany the patient in pre-op.  This support person may not rotate.  This visitor will be contacted when the time is appropriate for the visitor to come back in the pre-op area.  Please refer to the [facility] website for the visitor guidelines for Inpatients (after your surgery is over and you are in a regular room).  You are not required to quarantine at this time prior to your surgery. However, you must do this: Hand Hygiene often Do NOT share personal items Notify your provider if you are in close contact with someone who has COVID or you develop fever 100.4 or greater, new onset of sneezing, cough, sore throat, shortness of breath or body aches.  If you test positive for Covid or have been in contact with anyone that has tested positive in the last 10 days please notify you surgeon.    Your procedure is scheduled on:  MONDAY  January 23, 2024  Report to [facility] Main Entrance: [address] entrance where the [employer] is available.   Report to admitting at: 05:15    AM  Call this number if you have any questions or problems the morning of surgery [phone]  Do not eat food after Midnight the night prior to your surgery/procedure.  After Midnight you may have the following liquids until   04:30 AM  DAY OF SURGERY  Clear Liquid Diet Water Black Coffee (sugar ok, NO MILK/CREAM OR CREAMERS)  Tea (sugar ok, NO MILK/CREAM OR CREAMERS) regular and decaf                             Plain Jell-O  with no fruit (NO RED)                                           Fruit  ices (not with fruit pulp, NO RED)                                     Popsicles (NO RED)                                                                  Juice: NO CITRUS JUICES: only apple, WHITE grape, WHITE cranberry Sports drinks like Gatorade or Powerade (NO RED)                   The day of surgery:  Drink ONE (1) Pre-Surgery G2 at  04:30 AM the morning of surgery. Drink in one sitting. Do not sip.  This drink was given to you during your hospital pre-op appointment visit. Nothing else to drink after  completing the Pre-Surgery G2 : No candy, chewing gum or throat lozenges.    FOLLOW ANY ADDITIONAL PRE OP INSTRUCTIONS YOU RECEIVED FROM YOUR SURGEON'S OFFICE!!!   Oral Hygiene is also important to reduce your risk of infection.        Remember - BRUSH YOUR TEETH THE MORNING OF SURGERY WITH YOUR REGULAR TOOTHPASTE  Do NOT smoke after Midnight the night before surgery.   How to Manage Your Diabetes Before and After Surgery  Why is it important to control my blood sugar before and after surgery? Improving blood sugar levels before and after surgery helps healing and can limit problems. A way of improving blood sugar control is eating a healthy diet by:  Eating less sugar and carbohydrates  Increasing activity/exercise  Talking with your doctor about reaching your blood sugar goals High blood sugars (greater than 180 mg/dL) can raise your risk of infections and slow your recovery, so you will need to focus on controlling your diabetes during the weeks before surgery. Make sure that the doctor who takes care of your diabetes knows about your planned surgery including the date and location.  How do I manage my blood sugar before surgery? Check your blood sugar at least 4 times a day, starting 2 days before surgery, to make sure that the level is not too high or low. Check your blood sugar the morning of your surgery when you wake up and every 2 hours until you get to the Short Stay  unit. If your blood sugar is less than 70 mg/dL, you will need to treat for low blood sugar: Do not take insulin . Treat a low blood sugar (less than 70 mg/dL) with  cup of clear juice (cranberry or apple), 4 glucose tablets, OR glucose gel. Recheck blood sugar in 15 minutes after treatment (to make sure it is greater than 70 mg/dL). If your blood sugar is not greater than 70 mg/dL on recheck, call [phone] for further instructions. Report your blood sugar to the short stay nurse when you get to Short Stay.  If you are admitted to the hospital after surgery: Your blood sugar will be checked by the staff and you will probably be given insulin  after surgery (instead of oral diabetes medicines) to make sure you have good blood sugar levels. The goal for blood sugar control after surgery is 80-180 mg/dL.   WHAT DO I DO ABOUT MY DIABETES MEDICATION?  THE DAY/ NIGHT BEFORE SURGERY  Insulin  Lispro (Humalog) take SS as usual                                                                    Insulin  Degludec Lelon)  take 32 units if you take in the morning, (Bedtime/ Evening take 16 units)  THE MORNING OF SURGERY,  Insulin  Lispro (Humalog) If your CBG is greater than 220 mg/dL, you may take  of your sliding scale (correction) dose of insulin .                                     Insulin  Degludec Lelon) - Take 50% or 16 units   IF you have any questions, call the nurse at  [phone]  STOP TAKING all Vitamins, Herbs and supplements 1 week before your surgery.   Take ONLY these medicines the morning of surgery with A SIP OF WATER: Escitalopram, omeprazole, amlodipine  and Tylenol  if needed for pain.   DO NOT TAKE triamterene -HCTZ or Irbesartan  the morning of surgery.    If You have been diagnosed with Sleep Apnea - Bring CPAP mask and tubing day of surgery. We will provide you with a CPAP machine on the day of your surgery.                   You may not have any metal on your body  including hair pins, jewelry, and body piercing  Do not wear make-up, lotions, powders, perfumes / cologne, or deodorant  Do not wear nail polish including gel and S&S, artificial / acrylic nails, or any other type of covering on natural nails including finger and toenails. If you have artificial nails, gel coating, etc., that needs to be removed by a nail salon, Please have this removed prior to surgery. Not doing so may mean that your surgery could be cancelled or delayed if the Surgeon or anesthesia staff feels like they are unable to monitor you safely.   Do not shave 48 hours prior to surgery to avoid nicks in your skin which may contribute to postoperative infections.   Contacts, Hearing Aids, dentures or bridgework may not be worn into surgery. DENTURES WILL BE REMOVED PRIOR TO SURGERY PLEASE DO NOT APPLY Poly grip OR ADHESIVES!!!  You may bring a small overnight bag with you on the day of surgery, only pack items that are not valuable. [facility] IS NOT RESPONSIBLE   FOR VALUABLES THAT ARE LOST OR STOLEN.   Do not bring your home medications to the hospital. The Pharmacy will dispense medications listed on your medication list to you during your admission in the Hospital.  Special Instructions: Bring a copy of your healthcare power of attorney and living will documents the day of surgery, if you wish to have them scanned into your [facility]- EPIC  Please read over the following fact sheets you were given: IF YOU HAVE QUESTIONS ABOUT YOUR PRE-OP INSTRUCTIONS, PLEASE CALL [phone].      Pre-operative 4 CHG Bath Instructions   You can play a key role in reducing the risk of infection after surgery. Your skin needs to be as free of germs as possible. You can reduce the number of germs on your skin by washing with CHG (chlorhexidine  gluconate) soap before surgery. CHG is an antiseptic soap that kills germs and continues to kill germs even after washing.   DO NOT  use if you have an allergy to chlorhexidine /CHG or antibacterial soaps. If your skin becomes reddened or irritated, stop using the CHG and notify one of our RNs at   Please shower with the CHG soap starting 4 days before surgery using the following schedule:  [facility]  January 18, 2024    Please keep in mind the following:  DO NOT shave, including legs and underarms, starting the day of your first shower.   You may shave your face at any point before/day of surgery.  Place clean sheets on your bed the day you start using CHG soap. Use a clean washcloth (not used since being washed) for each shower. DO NOT sleep with pets once you start using the CHG.  CHG Shower Instructions:  If you choose to wash your hair and private area, wash  first with your normal shampoo/soap.  After you use shampoo/soap, rinse your hair and body thoroughly to remove shampoo/soap residue.  Turn the water OFF and apply about 3 tablespoons (45 ml) of CHG soap to a CLEAN washcloth.  Apply CHG soap ONLY FROM YOUR NECK DOWN TO YOUR TOES (washing for 3-5 minutes)  DO NOT use CHG soap on face, private areas, open wounds, or sores.  Pay special attention to the area where your surgery is being performed.  If you are having back surgery, having someone wash your back for you may be helpful. Wait 2 minutes after CHG soap is applied, then you may rinse off the CHG soap.  Pat dry with a clean towel  Put on clean clothes/pajamas   If you choose to wear lotion, please use ONLY the CHG-compatible lotions on the back of this paper.     Additional instructions for the day of surgery: DO NOT APPLY any CHG Soap,  lotions, deodorants, cologne, or perfumes on the day of surgery  Put on clean/comfortable clothes.  Brush your teeth.  Ask your nurse before applying any prescription medications to the skin.   CHG Compatible Lotions   Aveeno Moisturizing lotion  Cetaphil Moisturizing Cream  Cetaphil Moisturizing Lotion  Clairol  Herbal Essence Moisturizing Lotion, Dry Skin  Clairol Herbal Essence Moisturizing Lotion, Extra Dry Skin  Clairol Herbal Essence Moisturizing Lotion, Normal Skin  Curel Age Defying Therapeutic Moisturizing Lotion with Alpha Hydroxy  Curel Extreme Care Body Lotion  Curel Soothing Hands Moisturizing Hand Lotion  Curel Therapeutic Moisturizing Cream, Fragrance-Free  Curel Therapeutic Moisturizing Lotion, Fragrance-Free  Curel Therapeutic Moisturizing Lotion, Original Formula  Eucerin Daily Replenishing Lotion  Eucerin Dry Skin Therapy Plus Alpha Hydroxy Crme  Eucerin Dry Skin Therapy Plus Alpha Hydroxy Lotion  Eucerin Original Crme  Eucerin Original Lotion  Eucerin Plus Crme Eucerin Plus Lotion  Eucerin TriLipid Replenishing Lotion  Keri Anti-Bacterial Hand Lotion  Keri Deep Conditioning Original Lotion Dry Skin Formula Softly Scented  Keri Deep Conditioning Original Lotion, Fragrance Free Sensitive Skin Formula  Keri Lotion Fast Absorbing Fragrance Free Sensitive Skin Formula  Keri Lotion Fast Absorbing Softly Scented Dry Skin Formula  Keri Original Lotion  Keri Skin Renewal Lotion Keri Silky Smooth Lotion  Keri Silky Smooth Sensitive Skin Lotion  Nivea Body Creamy Conditioning Oil  Nivea Body Extra Enriched Lotion  Nivea Body Original Lotion  Nivea Body Sheer Moisturizing Lotion Nivea Crme  Nivea Skin Firming Lotion  NutraDerm 30 Skin Lotion  NutraDerm Skin Lotion  NutraDerm Therapeutic Skin Cream  NutraDerm Therapeutic Skin Lotion  ProShield Protective Hand Cream  Provon moisturizing lotion   FAILURE TO FOLLOW THESE INSTRUCTIONS MAY RESULT IN THE CANCELLATION OF YOUR SURGERY  PATIENT SIGNATURE_________________________________  NURSE SIGNATURE__________________________________  ________________________________________________________________________       [name]    An incentive spirometer is a tool that can help keep your lungs clear and active.  This tool measures how well you are filling your lungs with each breath. Taking long deep breaths may help reverse or decrease the chance of developing breathing (pulmonary) problems (especially infection) following: A long period of time when you are unable to move or be active. BEFORE THE PROCEDURE  If the spirometer includes an indicator to show your best effort, your nurse or respiratory therapist will set it to a desired goal. If possible, sit up straight or lean slightly forward. Try not to slouch. Hold the incentive spirometer in an upright position. INSTRUCTIONS FOR USE  Sit on  the edge of your bed if possible, or sit up as far as you can in bed or on a chair. Hold the incentive spirometer in an upright position. Breathe out normally. Place the mouthpiece in your mouth and seal your lips tightly around it. Breathe in slowly and as deeply as possible, raising the piston or the ball toward the top of the column. Hold your breath for 3-5 seconds or for as long as possible. Allow the piston or ball to fall to the bottom of the column. Remove the mouthpiece from your mouth and breathe out normally. Rest for a few seconds and repeat Steps 1 through 7 at least 10 times every 1-2 hours when you are awake. Take your time and take a few normal breaths between deep breaths. The spirometer may include an indicator to show your best effort. Use the indicator as a goal to work toward during each repetition. After each set of 10 deep breaths, practice coughing to be sure your lungs are clear. If you have an incision (the cut made at the time of surgery), support your incision when coughing by placing a pillow or rolled up towels firmly against it. Once you are able to get out of bed, walk around indoors and cough well. You may stop using the incentive spirometer when instructed by your caregiver.  RISKS AND COMPLICATIONS Take your time so you do not get dizzy or light-headed. If you are in pain, you may  need to take or ask for pain medication before doing incentive spirometry. It is harder to take a deep breath if you are having pain. AFTER USE Rest and breathe slowly and easily. It can be helpful to keep track of a log of your progress. Your caregiver can provide you with a simple table to help with this. If you are using the spirometer at home, follow these instructions: SEEK MEDICAL CARE IF:  You are having difficultly using the spirometer. You have trouble using the spirometer as often as instructed. Your pain medication is not giving enough relief while using the spirometer. You develop fever of 100.5 F (38.1 C) or higher.                                                                                                    SEEK IMMEDIATE MEDICAL CARE IF:  You cough up bloody sputum that had not been present before. You develop fever of 102 F (38.9 C) or greater. You develop worsening pain at or near the incision site. MAKE SURE YOU:  Understand these instructions. Will watch your condition. Will get help right away if you are not doing well or get worse. Document Released: 07/19/2006 Document Revised: 05/31/2011 Document Reviewed: 09/19/2006 [facility] Patient Information 2014 [place], [place].     WHAT IS A BLOOD TRANSFUSION? Blood Transfusion Information  A transfusion is the replacement of blood or some of its parts. Blood is made up of multiple cells which provide different functions. Red blood cells carry oxygen and are used for blood loss replacement. White blood cells fight against infection. Platelets control bleeding. Plasma  helps clot blood. Other blood products are available for specialized needs, such as hemophilia or other clotting disorders. BEFORE THE TRANSFUSION  Who gives blood for transfusions?  Healthy volunteers who are fully evaluated to make sure their blood is safe. This is blood bank blood. Transfusion therapy is the safest it has ever been in the  practice of medicine. Before blood is taken from a donor, a complete history is taken to make sure that person has no history of diseases nor engages in risky social behavior (examples are intravenous drug use or sexual activity with multiple partners). The donor's travel history is screened to minimize risk of transmitting infections, such as malaria. The donated blood is tested for signs of infectious diseases, such as HIV and hepatitis. The blood is then tested to be sure it is compatible with you in order to minimize the chance of a transfusion reaction. If you or a relative donates blood, this is often done in anticipation of surgery and is not appropriate for emergency situations. It takes many days to process the donated blood. RISKS AND COMPLICATIONS Although transfusion therapy is very safe and saves many lives, the main dangers of transfusion include:  Getting an infectious disease. Developing a transfusion reaction. This is an allergic reaction to something in the blood you were given. Every precaution is taken to prevent this. The decision to have a blood transfusion has been considered carefully by your caregiver before blood is given. Blood is not given unless the benefits outweigh the risks. AFTER THE TRANSFUSION Right after receiving a blood transfusion, you will usually feel much better and more energetic. This is especially true if your red blood cells have gotten low (anemic). The transfusion raises the level of the red blood cells which carry oxygen, and this usually causes an energy increase. The nurse administering the transfusion will monitor you carefully for complications. HOME CARE INSTRUCTIONS  No special instructions are needed after a transfusion. You may find your energy is better. Speak with your caregiver about any limitations on activity for underlying diseases you may have. SEEK MEDICAL CARE IF:  Your condition is not improving after your transfusion. You develop  redness or irritation at the intravenous (IV) site. SEEK IMMEDIATE MEDICAL CARE IF:  Any of the following symptoms occur over the next 12 hours: Shaking chills. You have a temperature by mouth above 102 F (38.9 C), not controlled by medicine. Chest, back, or muscle pain. People around you feel you are not acting correctly or are confused. Shortness of breath or difficulty breathing. Dizziness and fainting. You get a rash or develop hives. You have a decrease in urine output. Your urine turns a dark color or changes to pink, red, or brown. Any of the following symptoms occur over the next 10 days: You have a temperature by mouth above 102 F (38.9 C), not controlled by medicine. Shortness of breath. Weakness after normal activity. The white part of the eye turns yellow (jaundice). You have a decrease in the amount of urine or are urinating less often. Your urine turns a dark color or changes to pink, red, or brown. Document Released: 03/05/2000 Document Revised: 05/31/2011 Document Reviewed: 10/23/2007 [facility] Patient Information 2014 [employer], [place].  _______________________________________________________________________     If you would like to see a video about joint replacement:   [email or web address]

## 2024-01-09 NOTE — H&P (View-Only) (Signed)
 TOTAL KNEE ADMISSION H&P  Patient is being admitted for left total knee arthroplasty.  Subjective:  Chief Complaint:left knee pain.  HPI: [employer], 78 y.o. female, has a history of pain and functional disability in the left knee due to post-traumatic arthritis and has failed conservative treatments for greater than 12 weeks to includeNSAID's and/or analgesics, supervised PT with diminished ADL's post treatment, use of assistive devices, and activity modification.  Onset of symptoms was gradual, starting 1-2 years ago with gradually worsening course since that time. The patient noted prior procedures on the knee to include  ORIF, subsequent ORIF revision, and then subsequent ORIF hardware removal on the left knee(s).  Patient currently rates pain in the left knee(s) at 10 out of 10 with activity. Patient has night pain, worsening of pain with activity and weight bearing, pain that interferes with activities of daily living, and pain with passive range of motion.  Patient has evidence of periarticular osteophytes and joint space narrowing by imaging studies.  There is no active infection.  Patient Active Problem List   Diagnosis Date Noted   Closed fracture of left tibial plateau 09/02/2022   Left tibial fracture 07/29/2022   Dyspnea on exertion 09/15/2020   CVA (cerebral vascular accident) (HCC) 04/20/2019   Pain in left hip 10/05/2016   Pain in right hip 10/05/2016   Trochanteric bursitis, right hip 10/05/2016   Trochanteric bursitis, left hip 10/05/2016   Chronic insomnia 01/28/2011   Seasonal and perennial allergic rhinitis 05/28/2010   Lung nodule 05/28/2010   HYPERLIPIDEMIA 11/23/2009   G E R D 11/23/2009   EUSTACHIAN TUBE DYSFUNCTION 11/18/2009   HYPERTENSION 11/18/2009   Unspecified hypothyroidism 03/14/2007   Obstructive sleep apnea 03/14/2007   FIBROMYALGIA 03/14/2007   Past Medical History:  Diagnosis Date   Anxiety    Arthritis    Depression    DVT (deep venous  thrombosis) (HCC) 08/02/2022   LLE 08/02/22   Essential hypertension    Eustachian tube dysfunction    Fibromyalgia    GERD (gastroesophageal reflux disease)    Hyperlipidemia    Hypothyroid    Mild carotid artery disease    OSA (obstructive sleep apnea)    uses CPAP nightly   Osteopenia    Pneumonia    x several   Stroke (HCC) 2016   Type 2 diabetes mellitus (HCC)     Past Surgical History:  Procedure Laterality Date   ANKLE SURGERY     Anteriorvesicourethropexy     BUNIONECTOMY     CHOLECYSTECTOMY     COLONOSCOPY     HARDWARE REMOVAL Left 10/21/2023   Procedure: REMOVAL, HARDWARE PROXIMAL TIBIA;  Surgeon: Kendal Franky SQUIBB, MD;  Location: [facility] OR;  Service: Orthopedics;  Laterality: Left;   NASAL SINUS SURGERY     ORIF TIBIA PLATEAU Left 07/30/2022   Procedure: OPEN REDUCTION INTERNAL FIXATION (ORIF) TIBIAL PLATEAU;  Surgeon: Kendal Franky SQUIBB, MD;  Location: [facility] OR;  Service: Orthopedics;  Laterality: Left;   ORIF TIBIA PLATEAU Left 09/02/2022   Procedure: REVISION FIXATION OF LEFT TIBIAL PLATEAU FRACTURE;  Surgeon: Kendal Franky SQUIBB, MD;  Location: [facility] OR;  Service: Orthopedics;  Laterality: Left;   PATELLA FRACTURE SURGERY  03/23/2007   Right   TONSILLECTOMY     TOTAL ABDOMINAL HYSTERECTOMY     WRIST FRACTURE SURGERY  03/23/2007    Current Outpatient Medications  Medication Sig Dispense Refill Last Dose/Taking   acetaminophen  (TYLENOL ) 500 MG tablet Take 500 mg by mouth every 6 (  six) hours as needed for moderate pain (pain score 4-6).      amLODipine  (NORVASC ) 5 MG tablet Take 5 mg by mouth daily.      aspirin  EC (ADULT ASPIRIN  REGIMEN) 81 MG tablet Take 81 mg by mouth every evening.      cetirizine  (ZYRTEC ) 10 MG tablet Take 1 tablet (10 mg total) by mouth daily. (Patient not taking: Reported on 01/09/2024) 30 tablet 0    Cholecalciferol (VITAMIN D ) 50 MCG (2000 UT) tablet Take 2,000 Units by mouth daily.      Cyanocobalamin  (VITAMIN B12) 1000 MCG TABS Take 1,000 mcg by mouth  daily.      escitalopram (LEXAPRO) 10 MG tablet Take 10 mg by mouth in the morning.      insulin  degludec (TRESIBA FLEXTOUCH) 200 UNIT/ML FlexTouch Pen Inject 32 Units into the skin daily.      insulin  lispro (HUMALOG KWIKPEN) 100 UNIT/ML KwikPen Inject 8-10 Units into the skin 3 (three) times daily with meals. Sliding scale      irbesartan  (AVAPRO ) 300 MG tablet Take 300 mg by mouth daily.      MAGNESIUM  COMPLEX HIGH POTENCY PO Take 300 mg by mouth at bedtime.      methocarbamol  (ROBAXIN ) 500 MG tablet Take 1 tablet (500 mg total) by mouth every 6 (six) hours as needed. (Patient not taking: Reported on 01/09/2024) 15 tablet 0    metoprolol  succinate (TOPROL -XL) 50 MG 24 hr tablet Take 50 mg by mouth every evening.      Multiple Vitamins-Minerals (HAIR SKIN & NAILS) TABS Take 6,000 mcg by mouth daily.      Multiple Vitamins-Minerals (MULTIVITAMIN WITH MINERALS) tablet Take 1 tablet by mouth daily. Alive woman 50+      omeprazole (PRILOSEC) 20 MG capsule Take 20 mg by mouth daily.      rosuvastatin  (CRESTOR ) 20 MG tablet Take 20 mg by mouth at bedtime.      traMADol  (ULTRAM ) 50 MG tablet Take 1-2 tablets (50-100 mg total) by mouth every 6 (six) hours as needed for severe pain (pain score 7-10). (Patient not taking: Reported on 01/09/2024) 20 tablet 0    triamterene -hydrochlorothiazide (MAXZIDE-25) 37.5-25 MG tablet Take 1 tablet by mouth daily.      No current facility-administered medications for this visit.   Allergies  Allergen Reactions   Hydrochlorothiazide Itching   Oxycodone  Itching    Pt can tolerate with benadryl     Social History   Tobacco Use   Smoking status: Never   Smokeless tobacco: Never  Substance Use Topics   Alcohol  use: No    Family History  Problem Relation Age of Onset   Heart attack Mother    Colon cancer Father      Review of Systems  Musculoskeletal:  Positive for arthralgias.  All other systems reviewed and are negative.   Objective:  Physical  Exam Constitutional:      General: She is not in acute distress.    Appearance: Normal appearance. She is not ill-appearing.  HENT:     Head: Normocephalic and atraumatic.     Right Ear: External ear normal.     Left Ear: External ear normal.     Nose: Nose normal.     Mouth/Throat:     Mouth: Mucous membranes are moist.     Pharynx: Oropharynx is clear.  Eyes:     Extraocular Movements: Extraocular movements intact.     Conjunctiva/sclera: Conjunctivae normal.  Cardiovascular:     Rate and  Rhythm: Normal rate and regular rhythm.     Pulses: Normal pulses.  Pulmonary:     Effort: Pulmonary effort is normal.  Abdominal:     Palpations: Abdomen is soft.     Tenderness: There is no abdominal tenderness.  Musculoskeletal:        General: Tenderness present.     Cervical back: Normal range of motion and neck supple.  Skin:    General: Skin is warm and dry.  Neurological:     Mental Status: She is alert and oriented to person, place, and time. Mental status is at baseline.  Psychiatric:        Mood and Affect: Mood normal.        Behavior: Behavior normal.     Vital signs in last 24 hours: @VSRANGES @  Labs:   Estimated body mass index is 36.64 kg/m as calculated from the following:   Height as of 12/06/23: 5' (1.524 m).   Weight as of 12/06/23: 85.1 kg.   Imaging Review Plain radiographs demonstrate severe degenerative joint disease of the left knee(s). The overall alignment issignificant varus. The bone quality appears to be fair for age and reported activity level.      Assessment/Plan:  Post-traumatic severe arthritis, left knee   The patient history, physical examination, clinical judgment of the provider and imaging studies are consistent with severe post-traumatic arthritis of the left knee(s) and total knee arthroplasty is deemed medically necessary. The treatment options including medical management, injection therapy arthroscopy and arthroplasty were  discussed at length. The risks and benefits of total knee arthroplasty were presented and reviewed. The risks due to aseptic loosening, infection, stiffness, patella tracking problems, thromboembolic complications and other imponderables were discussed. The patient acknowledged the explanation, agreed to proceed with the plan and consent was signed. Patient is being admitted for inpatient treatment for surgery, pain control, PT, OT, prophylactic antibiotics, VTE prophylaxis, progressive ambulation and ADL's and discharge planning. The patient is planning to be discharged OPPT    Anticipated LOS equal to or greater than 2 midnights due to - Age 72 and older with one or more of the following:  - Obesity  - Expected need for hospital services (PT, OT, Nursing) required for safe  discharge  - Anticipated need for postoperative skilled nursing care or inpatient rehab  - Active co-morbidities: CAD, diabetes, HTN, HLD, fibromyalgia, DOE, depression, OSA, hearing loss, IBS, hypothyroidism, migraines, tinnitus, FALD, OAB, hx of DVT 2024, hx of CVA 2016 OR   - Unanticipated findings during/Post Surgery: None  - Patient is a high risk of re-admission due to: None

## 2024-01-10 ENCOUNTER — Other Ambulatory Visit: Payer: Self-pay

## 2024-01-10 ENCOUNTER — Encounter (HOSPITAL_COMMUNITY): Payer: Self-pay

## 2024-01-10 ENCOUNTER — Encounter (HOSPITAL_COMMUNITY)
Admission: RE | Admit: 2024-01-10 | Discharge: 2024-01-10 | Disposition: A | Discharge status: Home / Self Care | Source: Ambulatory Visit | Attending: Orthopedic Surgery | Admitting: Orthopedic Surgery

## 2024-01-10 VITALS — BP 134/65 | HR 64 | Temp 98.2°F | Resp 16 | Ht 61.0 in | Wt 182.0 lb

## 2024-01-10 DIAGNOSIS — M25562 Pain in left knee: Secondary | ICD-10-CM | POA: Insufficient documentation

## 2024-01-10 DIAGNOSIS — Z794 Long term (current) use of insulin: Secondary | ICD-10-CM | POA: Diagnosis not present

## 2024-01-10 DIAGNOSIS — M1712 Unilateral primary osteoarthritis, left knee: Secondary | ICD-10-CM

## 2024-01-10 DIAGNOSIS — Z01812 Encounter for preprocedural laboratory examination: Secondary | ICD-10-CM | POA: Diagnosis present

## 2024-01-10 DIAGNOSIS — G8929 Other chronic pain: Secondary | ICD-10-CM | POA: Diagnosis not present

## 2024-01-10 DIAGNOSIS — M172 Bilateral post-traumatic osteoarthritis of knee: Secondary | ICD-10-CM | POA: Diagnosis not present

## 2024-01-10 DIAGNOSIS — Z01818 Encounter for other preprocedural examination: Secondary | ICD-10-CM

## 2024-01-10 DIAGNOSIS — E119 Type 2 diabetes mellitus without complications: Secondary | ICD-10-CM | POA: Insufficient documentation

## 2024-01-10 HISTORY — DX: Myoneural disorder, unspecified: G70.9

## 2024-01-10 LAB — COMPREHENSIVE METABOLIC PANEL WITH GFR
ALT: 15 U/L (ref 0–44)
AST: 14 U/L — ABNORMAL LOW (ref 15–41)
Albumin: 4 g/dL (ref 3.5–5.0)
Alkaline Phosphatase: 74 U/L (ref 38–126)
Anion gap: 11 (ref 5–15)
BUN: 15 mg/dL (ref 8–23)
CO2: 23 mmol/L (ref 22–32)
Calcium: 9.4 mg/dL (ref 8.9–10.3)
Chloride: 105 mmol/L (ref 98–111)
Creatinine, Ser: 0.76 mg/dL (ref 0.44–1.00)
GFR, Estimated: >60 mL/min (ref >60)
Glucose, Bld: 100 mg/dL — ABNORMAL HIGH (ref 70–99)
Potassium: 4.5 mmol/L (ref 3.5–5.1)
Sodium: 139 mmol/L (ref 135–145)
Total Bilirubin: 0.4 mg/dL (ref 0.0–1.2)
Total Protein: 6.7 g/dL (ref 6.5–8.1)

## 2024-01-10 LAB — CBC WITH DIFFERENTIAL/PLATELET
Abs Immature Granulocytes: 0.01 K/uL (ref 0.00–0.07)
Basophils Absolute: 0.1 K/uL (ref 0.0–0.1)
Basophils Relative: 1 %
Eosinophils Absolute: 0.2 K/uL (ref 0.0–0.5)
Eosinophils Relative: 4 %
HCT: 34.3 % — ABNORMAL LOW (ref 36.0–46.0)
Hemoglobin: 10.4 g/dL — ABNORMAL LOW (ref 12.0–15.0)
Immature Granulocytes: 0 %
Lymphocytes Relative: 40 %
Lymphs Abs: 1.9 K/uL (ref 0.7–4.0)
MCH: 24 pg — ABNORMAL LOW (ref 26.0–34.0)
MCHC: 30.3 g/dL (ref 30.0–36.0)
MCV: 79 fL — ABNORMAL LOW (ref 80.0–100.0)
Monocytes Absolute: 0.5 K/uL (ref 0.1–1.0)
Monocytes Relative: 11 %
Neutro Abs: 2.1 K/uL (ref 1.7–7.7)
Neutrophils Relative %: 44 %
Platelets: 275 K/uL (ref 150–400)
RBC: 4.34 MIL/uL (ref 3.87–5.11)
RDW: 15 % (ref 11.5–15.5)
WBC: 4.8 K/uL (ref 4.0–10.5)
nRBC: 0 % (ref 0.0–0.2)

## 2024-01-10 LAB — SURGICAL PCR SCREEN
MRSA, PCR: NEGATIVE
Staphylococcus aureus: NEGATIVE

## 2024-01-10 LAB — GLUCOSE, CAPILLARY: Glucose-Capillary: 101 mg/dL — ABNORMAL HIGH (ref 70–99)

## 2024-01-19 LAB — TYPE AND SCREEN
ABO/RH(D): A POS
Antibody Screen: NEGATIVE

## 2024-01-20 NOTE — Care Plan (Signed)
 Ortho Bundle Case Management Note  Patient Details  Name: [name] MRN: [id number] Date of Birth: 09-06-1945  met with patient and daughter in the office for H&P. will discharge to home with family to assist. has rolling walker. OPPT set up with [facility]. discharge instructions discussed and questions answered. Patient and MD in agreement. Choice offered.                     DME Arranged:    DME Agency:     HH Arranged:    HH Agency:     Additional Comments: Please contact me with any questions of if this plan should need to change.  Charlies Pitch,  RN,BSN,MHA,CCM  [facility]  [phone] 01/20/2024, 4:15 PM

## 2024-01-23 ENCOUNTER — Encounter (HOSPITAL_COMMUNITY)
Admission: RE | Disposition: A | Discharge status: Home / Self Care | Payer: Self-pay | Source: Ambulatory Visit | Attending: Orthopedic Surgery

## 2024-01-23 ENCOUNTER — Encounter (HOSPITAL_COMMUNITY): Payer: Self-pay | Admitting: Orthopedic Surgery

## 2024-01-23 ENCOUNTER — Observation Stay (HOSPITAL_COMMUNITY)
Admission: RE | Admit: 2024-01-23 | Discharge: 2024-01-24 | Disposition: A | Discharge status: Home / Self Care | Source: Ambulatory Visit | Attending: Orthopedic Surgery | Admitting: Orthopedic Surgery

## 2024-01-23 ENCOUNTER — Other Ambulatory Visit: Payer: Self-pay

## 2024-01-23 ENCOUNTER — Ambulatory Visit (HOSPITAL_COMMUNITY): Admitting: Anesthesiology

## 2024-01-23 ENCOUNTER — Ambulatory Visit (HOSPITAL_COMMUNITY)

## 2024-01-23 DIAGNOSIS — M1712 Unilateral primary osteoarthritis, left knee: Secondary | ICD-10-CM | POA: Diagnosis not present

## 2024-01-23 DIAGNOSIS — E119 Type 2 diabetes mellitus without complications: Secondary | ICD-10-CM | POA: Insufficient documentation

## 2024-01-23 DIAGNOSIS — Z01818 Encounter for other preprocedural examination: Secondary | ICD-10-CM

## 2024-01-23 DIAGNOSIS — Z794 Long term (current) use of insulin: Secondary | ICD-10-CM | POA: Diagnosis not present

## 2024-01-23 DIAGNOSIS — I10 Essential (primary) hypertension: Secondary | ICD-10-CM | POA: Insufficient documentation

## 2024-01-23 DIAGNOSIS — Z8673 Personal history of transient ischemic attack (TIA), and cerebral infarction without residual deficits: Secondary | ICD-10-CM | POA: Diagnosis not present

## 2024-01-23 DIAGNOSIS — M1732 Unilateral post-traumatic osteoarthritis, left knee: Secondary | ICD-10-CM

## 2024-01-23 DIAGNOSIS — M25562 Pain in left knee: Secondary | ICD-10-CM | POA: Diagnosis present

## 2024-01-23 DIAGNOSIS — Z471 Aftercare following joint replacement surgery: Secondary | ICD-10-CM | POA: Diagnosis not present

## 2024-01-23 DIAGNOSIS — Z79899 Other long term (current) drug therapy: Secondary | ICD-10-CM | POA: Insufficient documentation

## 2024-01-23 DIAGNOSIS — E039 Hypothyroidism, unspecified: Secondary | ICD-10-CM | POA: Insufficient documentation

## 2024-01-23 DIAGNOSIS — G8918 Other acute postprocedural pain: Secondary | ICD-10-CM | POA: Diagnosis not present

## 2024-01-23 DIAGNOSIS — G4730 Sleep apnea, unspecified: Secondary | ICD-10-CM | POA: Diagnosis not present

## 2024-01-23 DIAGNOSIS — Z96652 Presence of left artificial knee joint: Secondary | ICD-10-CM | POA: Diagnosis not present

## 2024-01-23 HISTORY — PX: TOTAL KNEE ARTHROPLASTY: SHX125

## 2024-01-23 LAB — GLUCOSE, CAPILLARY
Glucose-Capillary: 133 mg/dL — ABNORMAL HIGH (ref 70–99)
Glucose-Capillary: 119 mg/dL — ABNORMAL HIGH (ref 70–99)
Glucose-Capillary: 121 mg/dL — ABNORMAL HIGH (ref 70–99)
Glucose-Capillary: 158 mg/dL — ABNORMAL HIGH (ref 70–99)

## 2024-01-23 SURGERY — ARTHROPLASTY, KNEE, TOTAL
Anesthesia: Spinal | Site: Knee | Laterality: Left

## 2024-01-23 MED ORDER — POLYETHYLENE GLYCOL 3350 17 G PO PACK: 17.0000 g | PACK | Freq: Every day | ORAL | 0 refills | Status: AC

## 2024-01-23 MED ORDER — HYDROCODONE-ACETAMINOPHEN 5-325 MG PO TABS: 1.0000 | ORAL_TABLET | ORAL | 0 refills | Status: AC | PRN

## 2024-01-23 MED ORDER — APIXABAN 2.5 MG PO TABS: 2.5000 mg | ORAL_TABLET | Freq: Two times a day (BID) | ORAL | 0 refills | Status: AC

## 2024-01-23 MED ORDER — AMLODIPINE BESYLATE 5 MG PO TABS
5.0000 mg | ORAL_TABLET | Freq: Every day | ORAL | Status: DC
  Administered 2024-01-24: 5 mg via ORAL
  Filled 2024-01-23: qty 1

## 2024-01-23 MED ORDER — INSULIN ASPART 100 UNIT/ML IJ SOLN: 0.0000 [IU] | INTRAMUSCULAR | Status: DC | PRN

## 2024-01-23 MED ORDER — ONDANSETRON HCL 4 MG PO TABS: 4.0000 mg | ORAL_TABLET | Freq: Three times a day (TID) | ORAL | 0 refills | Status: AC | PRN

## 2024-01-23 MED ORDER — ONDANSETRON HCL 4 MG/2ML IJ SOLN
INTRAMUSCULAR | Status: AC
Start: 2024-01-23 — End: 2024-01-23
  Filled 2024-01-23: qty 2

## 2024-01-23 MED ORDER — BUPIVACAINE LIPOSOME 1.3 % IJ SUSP
INTRAMUSCULAR | Status: DC | PRN
  Administered 2024-01-23: 20 mL

## 2024-01-23 MED ORDER — SODIUM CHLORIDE 0.9 % IR SOLN
Status: DC | PRN
  Administered 2024-01-23: 1000 mL

## 2024-01-23 MED ORDER — DEXAMETHASONE SOD PHOSPHATE PF 10 MG/ML IJ SOLN: 4.0000 mg | Freq: Once | INTRAMUSCULAR | Status: AC

## 2024-01-23 MED ORDER — METOPROLOL SUCCINATE ER 50 MG PO TB24
50.0000 mg | ORAL_TABLET | Freq: Every evening | ORAL | Status: DC
  Administered 2024-01-23: 50 mg via ORAL
  Filled 2024-01-23: qty 1

## 2024-01-23 MED ORDER — PROPOFOL 500 MG/50ML IV EMUL
INTRAVENOUS | Status: DC | PRN
  Administered 2024-01-23: 50 ug/kg/min via INTRAVENOUS

## 2024-01-23 MED ORDER — MENTHOL 3 MG MT LOZG: 1.0000 | LOZENGE | OROMUCOSAL | Status: DC | PRN

## 2024-01-23 MED ORDER — INSULIN GLARGINE-YFGN 100 UNIT/ML ~~LOC~~ SOLN
32.0000 [IU] | Freq: Every day | SUBCUTANEOUS | Status: DC
  Administered 2024-01-24: 32 [IU] via SUBCUTANEOUS
  Filled 2024-01-23: qty 0.32

## 2024-01-23 MED ORDER — ONDANSETRON HCL 4 MG PO TABS: 4.0000 mg | ORAL_TABLET | Freq: Four times a day (QID) | ORAL | Status: DC | PRN

## 2024-01-23 MED ORDER — INSULIN DEGLUDEC 200 UNIT/ML ~~LOC~~ SOPN: 32.0000 [IU] | PEN_INJECTOR | Freq: Every day | SUBCUTANEOUS | Status: DC

## 2024-01-23 MED ORDER — LACTATED RINGERS IV SOLN: INTRAVENOUS | Status: DC

## 2024-01-23 MED ORDER — CEFADROXIL 500 MG PO CAPS: 500.0000 mg | ORAL_CAPSULE | Freq: Two times a day (BID) | ORAL | 0 refills | Status: DC

## 2024-01-23 MED ORDER — POVIDONE-IODINE 10 % EX SWAB: 2.0000 | Freq: Once | CUTANEOUS | Status: AC

## 2024-01-23 MED ORDER — SODIUM CHLORIDE 0.9 % IV SOLN: INTRAVENOUS | Status: DC

## 2024-01-23 MED ORDER — SULFAMETHOXAZOLE-TRIMETHOPRIM 800-160 MG PO TABS: 1.0000 | ORAL_TABLET | Freq: Two times a day (BID) | ORAL | Status: DC

## 2024-01-23 MED ORDER — ROPIVACAINE HCL 5 MG/ML IJ SOLN
INTRAMUSCULAR | Status: DC | PRN
  Administered 2024-01-23: 20 mL via PERINEURAL

## 2024-01-23 MED ORDER — PANTOPRAZOLE SODIUM 40 MG PO TBEC
40.0000 mg | DELAYED_RELEASE_TABLET | Freq: Every day | ORAL | Status: DC
  Administered 2024-01-23 – 2024-01-24 (×2): 40 mg via ORAL
  Filled 2024-01-23 (×2): qty 1

## 2024-01-23 MED ORDER — HYDROXYZINE HCL 25 MG PO TABS
25.0000 mg | ORAL_TABLET | Freq: Three times a day (TID) | ORAL | Status: DC | PRN
  Administered 2024-01-23: 25 mg via ORAL
  Filled 2024-01-23: qty 1

## 2024-01-23 MED ORDER — DROPERIDOL 2.5 MG/ML IJ SOLN: 0.6250 mg | Freq: Once | INTRAMUSCULAR | Status: DC | PRN

## 2024-01-23 MED ORDER — VANCOMYCIN HCL IN DEXTROSE 1-5 GM/200ML-% IV SOLN
1000.0000 mg | INTRAVENOUS | Status: AC
  Administered 2024-01-23: 1000 mg via INTRAVENOUS
  Filled 2024-01-23: qty 200

## 2024-01-23 MED ORDER — TRIAMTERENE-HCTZ 37.5-25 MG PO TABS
1.0000 | ORAL_TABLET | Freq: Every day | ORAL | Status: DC
  Administered 2024-01-24: 1 via ORAL
  Filled 2024-01-23: qty 1

## 2024-01-23 MED ORDER — ACETAMINOPHEN 325 MG PO TABS: 325.0000 mg | ORAL_TABLET | Freq: Four times a day (QID) | ORAL | Status: DC | PRN

## 2024-01-23 MED ORDER — SODIUM CHLORIDE (PF) 0.9 % IJ SOLN
INTRAMUSCULAR | Status: AC
Start: 2024-01-23 — End: 2024-01-23
  Filled 2024-01-23: qty 30

## 2024-01-23 MED ORDER — PHENYLEPHRINE HCL-NACL 20-0.9 MG/250ML-% IV SOLN
INTRAVENOUS | Status: DC | PRN
  Administered 2024-01-23: 50 ug/min via INTRAVENOUS

## 2024-01-23 MED ORDER — ONDANSETRON HCL 4 MG/2ML IJ SOLN: 4.0000 mg | Freq: Once | INTRAMUSCULAR | Status: DC | PRN

## 2024-01-23 MED ORDER — BUPIVACAINE IN DEXTROSE 0.75-8.25 % IT SOLN
INTRATHECAL | Status: DC | PRN
  Administered 2024-01-23: 1.6 mL via INTRATHECAL

## 2024-01-23 MED ORDER — ROSUVASTATIN CALCIUM 20 MG PO TABS
20.0000 mg | ORAL_TABLET | Freq: Every day | ORAL | Status: DC
  Administered 2024-01-23: 20 mg via ORAL
  Filled 2024-01-23: qty 1

## 2024-01-23 MED ORDER — BUPIVACAINE LIPOSOME 1.3 % IJ SUSP
INTRAMUSCULAR | Status: AC
  Filled 2024-01-23: qty 20

## 2024-01-23 MED ORDER — TRANEXAMIC ACID-NACL 1000-0.7 MG/100ML-% IV SOLN
1000.0000 mg | INTRAVENOUS | Status: AC
Start: 2024-01-23 — End: 2024-01-23
  Administered 2024-01-23: 1000 mg via INTRAVENOUS
  Filled 2024-01-23: qty 100

## 2024-01-23 MED ORDER — ORAL CARE MOUTH RINSE: 15.0000 mL | Freq: Once | OROMUCOSAL | Status: AC

## 2024-01-23 MED ORDER — ACETAMINOPHEN 500 MG PO TABS
1000.0000 mg | ORAL_TABLET | Freq: Four times a day (QID) | ORAL | Status: AC
  Administered 2024-01-23 – 2024-01-24 (×2): 1000 mg via ORAL
  Filled 2024-01-23 (×3): qty 2

## 2024-01-23 MED ORDER — SODIUM CHLORIDE 0.9% FLUSH
INTRAVENOUS | Status: DC | PRN
  Administered 2024-01-23: 30 mL

## 2024-01-23 MED ORDER — BUPIVACAINE-EPINEPHRINE (PF) 0.25% -1:200000 IJ SOLN
INTRAMUSCULAR | Status: AC
  Filled 2024-01-23: qty 30

## 2024-01-23 MED ORDER — PROPOFOL 10 MG/ML IV BOLUS
INTRAVENOUS | Status: DC | PRN
  Administered 2024-01-23 (×2): 20 mg via INTRAVENOUS
  Administered 2024-01-23: 30 mg via INTRAVENOUS

## 2024-01-23 MED ORDER — FENTANYL CITRATE (PF) 100 MCG/2ML IJ SOLN
INTRAMUSCULAR | Status: DC | PRN
  Administered 2024-01-23: 100 ug via INTRAVENOUS

## 2024-01-23 MED ORDER — METHOCARBAMOL 500 MG PO TABS
500.0000 mg | ORAL_TABLET | Freq: Four times a day (QID) | ORAL | Status: DC | PRN
  Administered 2024-01-23: 500 mg via ORAL
  Filled 2024-01-23: qty 1

## 2024-01-23 MED ORDER — DOCUSATE SODIUM 100 MG PO CAPS
100.0000 mg | ORAL_CAPSULE | Freq: Two times a day (BID) | ORAL | Status: DC
  Administered 2024-01-23 – 2024-01-24 (×2): 100 mg via ORAL
  Filled 2024-01-23 (×2): qty 1

## 2024-01-23 MED ORDER — IRBESARTAN 150 MG PO TABS
300.0000 mg | ORAL_TABLET | Freq: Every day | ORAL | Status: DC
  Administered 2024-01-24: 300 mg via ORAL
  Filled 2024-01-23: qty 2

## 2024-01-23 MED ORDER — CEFAZOLIN SODIUM-DEXTROSE 2-4 GM/100ML-% IV SOLN
2.0000 g | INTRAVENOUS | Status: AC
  Administered 2024-01-23: 2 g via INTRAVENOUS
  Filled 2024-01-23: qty 100

## 2024-01-23 MED ORDER — ESCITALOPRAM OXALATE 10 MG PO TABS
10.0000 mg | ORAL_TABLET | Freq: Every day | ORAL | Status: DC
  Administered 2024-01-24: 10 mg via ORAL
  Filled 2024-01-23: qty 1

## 2024-01-23 MED ORDER — INSULIN LISPRO (1 UNIT DIAL) 100 UNIT/ML (KWIKPEN): 8.0000 [IU] | PEN_INJECTOR | Freq: Three times a day (TID) | SUBCUTANEOUS | Status: DC

## 2024-01-23 MED ORDER — BUPIVACAINE-EPINEPHRINE 0.25% -1:200000 IJ SOLN
INTRAMUSCULAR | Status: DC | PRN
  Administered 2024-01-23: 30 mL

## 2024-01-23 MED ORDER — BUPIVACAINE LIPOSOME 1.3 % IJ SUSP: 20.0000 mL | Freq: Once | INTRAMUSCULAR | Status: AC

## 2024-01-23 MED ORDER — METHOCARBAMOL 1000 MG/10ML IJ SOLN: 500.0000 mg | Freq: Four times a day (QID) | INTRAMUSCULAR | Status: DC | PRN

## 2024-01-23 MED ORDER — ACETAMINOPHEN 500 MG PO TABS
1000.0000 mg | ORAL_TABLET | Freq: Once | ORAL | Status: AC
Start: 2024-01-23 — End: 2024-01-23
  Administered 2024-01-23: 1000 mg via ORAL
  Filled 2024-01-23: qty 2

## 2024-01-23 MED ORDER — HYDROMORPHONE HCL 1 MG/ML IJ SOLN: 0.5000 mg | INTRAMUSCULAR | Status: DC | PRN

## 2024-01-23 MED ORDER — METHOCARBAMOL 500 MG PO TABS: 500.0000 mg | ORAL_TABLET | Freq: Three times a day (TID) | ORAL | 0 refills | Status: AC | PRN

## 2024-01-23 MED ORDER — CHLORHEXIDINE GLUCONATE 0.12 % MT SOLN
15.0000 mL | Freq: Once | OROMUCOSAL | Status: AC
  Administered 2024-01-23: 15 mL via OROMUCOSAL

## 2024-01-23 MED ORDER — PHENOL 1.4 % MT LIQD: 1.0000 | OROMUCOSAL | Status: DC | PRN

## 2024-01-23 MED ORDER — ZOLPIDEM TARTRATE 5 MG PO TABS: 5.0000 mg | ORAL_TABLET | Freq: Every evening | ORAL | Status: DC | PRN

## 2024-01-23 MED ORDER — LORATADINE 10 MG PO TABS
10.0000 mg | ORAL_TABLET | Freq: Every day | ORAL | Status: DC
  Administered 2024-01-24: 10 mg via ORAL
  Filled 2024-01-23: qty 1

## 2024-01-23 MED ORDER — SULFAMETHOXAZOLE-TRIMETHOPRIM 800-160 MG PO TABS
1.0000 | ORAL_TABLET | Freq: Two times a day (BID) | ORAL | Status: DC
  Administered 2024-01-24: 1 via ORAL
  Filled 2024-01-23: qty 1

## 2024-01-23 MED ORDER — POLYETHYLENE GLYCOL 3350 17 G PO PACK
17.0000 g | PACK | Freq: Every day | ORAL | Status: DC | PRN
Start: 2024-01-23 — End: 2024-01-24

## 2024-01-23 MED ORDER — ACETAMINOPHEN 10 MG/ML IV SOLN: 1000.0000 mg | Freq: Once | INTRAVENOUS | Status: DC | PRN

## 2024-01-23 MED ORDER — HYDROCODONE-ACETAMINOPHEN 7.5-325 MG PO TABS
1.0000 | ORAL_TABLET | ORAL | Status: DC | PRN
  Administered 2024-01-23 – 2024-01-24 (×3): 1 via ORAL
  Filled 2024-01-23 (×3): qty 1

## 2024-01-23 MED ORDER — DEXAMETHASONE SOD PHOSPHATE PF 10 MG/ML IJ SOLN
INTRAMUSCULAR | Status: DC | PRN
  Administered 2024-01-23: 10 mg via INTRAVENOUS

## 2024-01-23 MED ORDER — KETOROLAC TROMETHAMINE 15 MG/ML IJ SOLN
7.5000 mg | Freq: Four times a day (QID) | INTRAMUSCULAR | Status: AC
  Administered 2024-01-23 – 2024-01-24 (×4): 7.5 mg via INTRAVENOUS
  Filled 2024-01-23 (×4): qty 1

## 2024-01-23 MED ORDER — SULFAMETHOXAZOLE-TRIMETHOPRIM 800-160 MG PO TABS: 1.0000 | ORAL_TABLET | Freq: Two times a day (BID) | ORAL | 0 refills | Status: AC

## 2024-01-23 MED ORDER — ONDANSETRON HCL 4 MG/2ML IJ SOLN: 4.0000 mg | Freq: Four times a day (QID) | INTRAMUSCULAR | Status: DC | PRN

## 2024-01-23 MED ORDER — ISOPROPYL ALCOHOL 70 % SOLN
Status: DC | PRN
  Administered 2024-01-23: 1 via TOPICAL

## 2024-01-23 MED ORDER — FENTANYL CITRATE (PF) 50 MCG/ML IJ SOSY
25.0000 ug | PREFILLED_SYRINGE | INTRAMUSCULAR | Status: DC | PRN
  Administered 2024-01-23: 50 ug via INTRAVENOUS

## 2024-01-23 MED ORDER — LIDOCAINE HCL (PF) 2 % IJ SOLN
INTRAMUSCULAR | Status: AC
  Filled 2024-01-23: qty 5

## 2024-01-23 MED ORDER — WATER FOR IRRIGATION, STERILE IR SOLN
Status: DC | PRN
  Administered 2024-01-23: 2000 mL

## 2024-01-23 MED ORDER — PROPOFOL 1000 MG/100ML IV EMUL
INTRAVENOUS | Status: AC
  Filled 2024-01-23: qty 100

## 2024-01-23 MED ORDER — FENTANYL CITRATE (PF) 100 MCG/2ML IJ SOLN
INTRAMUSCULAR | Status: AC
  Filled 2024-01-23: qty 2

## 2024-01-23 MED ORDER — APIXABAN 2.5 MG PO TABS
2.5000 mg | ORAL_TABLET | Freq: Two times a day (BID) | ORAL | Status: DC
  Administered 2024-01-24: 2.5 mg via ORAL
  Filled 2024-01-23: qty 1

## 2024-01-23 MED ORDER — INSULIN ASPART 100 UNIT/ML IJ SOLN
0.0000 [IU] | Freq: Three times a day (TID) | INTRAMUSCULAR | Status: DC
  Administered 2024-01-23: 3 [IU] via SUBCUTANEOUS
  Administered 2024-01-24: 2 [IU] via SUBCUTANEOUS

## 2024-01-23 MED ORDER — CELECOXIB 100 MG PO CAPS: 100.0000 mg | ORAL_CAPSULE | Freq: Two times a day (BID) | ORAL | 0 refills | Status: AC

## 2024-01-23 MED ORDER — CEFAZOLIN SODIUM-DEXTROSE 2-4 GM/100ML-% IV SOLN
2.0000 g | Freq: Four times a day (QID) | INTRAVENOUS | Status: AC
  Administered 2024-01-23 (×2): 2 g via INTRAVENOUS
  Filled 2024-01-23 (×2): qty 100

## 2024-01-23 MED ORDER — 0.9 % SODIUM CHLORIDE (POUR BTL) OPTIME
TOPICAL | Status: DC | PRN
  Administered 2024-01-23: 1000 mL

## 2024-01-23 MED ORDER — PROPOFOL 10 MG/ML IV BOLUS
INTRAVENOUS | Status: AC
  Filled 2024-01-23: qty 20

## 2024-01-23 MED ORDER — FENTANYL CITRATE (PF) 50 MCG/ML IJ SOSY
PREFILLED_SYRINGE | INTRAMUSCULAR | Status: AC
  Filled 2024-01-23: qty 2

## 2024-01-23 MED ORDER — DIPHENHYDRAMINE HCL 12.5 MG/5ML PO ELIX
12.5000 mg | ORAL_SOLUTION | ORAL | Status: DC | PRN
  Administered 2024-01-23 – 2024-01-24 (×3): 25 mg via ORAL
  Filled 2024-01-23 (×3): qty 10

## 2024-01-23 SURGICAL SUPPLY — 50 items
BAG COUNTER SPONGE SURGICOUNT (BAG) IMPLANT
BLADE SAG 18X100X1.27 (BLADE) ×1 IMPLANT
BLADE SAW SAG 35X64 .89 (BLADE) ×1 IMPLANT
BLADE SAW SGTL 11.0X1.19X90.0M (BLADE) IMPLANT
BNDG COHESIVE 3X5 TAN ST LF (GAUZE/BANDAGES/DRESSINGS) ×1 IMPLANT
BNDG ELASTIC 6X10 VLCR STRL LF (GAUZE/BANDAGES/DRESSINGS) ×1 IMPLANT
BOWL SMART MIX CTS (DISPOSABLE) IMPLANT
CEMENT BONE REFOBACIN R1X40 US (Cement) IMPLANT
CHLORAPREP W/TINT 26 (MISCELLANEOUS) ×2 IMPLANT
COVER SURGICAL LIGHT HANDLE (MISCELLANEOUS) ×1 IMPLANT
CUFF TRNQT CYL 34X4.125X (TOURNIQUET CUFF) ×1 IMPLANT
DERMABOND ADVANCED .7 DNX12 (GAUZE/BANDAGES/DRESSINGS) ×2 IMPLANT
DRAPE INCISE IOBAN 85X60 (DRAPES) ×1 IMPLANT
DRAPE SHEET LG 3/4 BI-LAMINATE (DRAPES) ×1 IMPLANT
DRAPE U-SHAPE 47X51 STRL (DRAPES) ×1 IMPLANT
DRSG AQUACEL AG ADV 3.5X10 (GAUZE/BANDAGES/DRESSINGS) ×1 IMPLANT
ELECT REM PT RETURN 15FT ADLT (MISCELLANEOUS) ×1 IMPLANT
FEMUR CMTD CR STD SZ 6 LT KNEE (Joint) IMPLANT
GAUZE SPONGE 4X4 12PLY STRL (GAUZE/BANDAGES/DRESSINGS) ×1 IMPLANT
GLOVE BIO SURGEON STRL SZ 6.5 (GLOVE) ×2 IMPLANT
GLOVE BIOGEL PI IND STRL 6.5 (GLOVE) ×1 IMPLANT
GLOVE BIOGEL PI IND STRL 8 (GLOVE) ×1 IMPLANT
GLOVE SURG ORTHO 8.0 STRL STRW (GLOVE) ×2 IMPLANT
GOWN STRL REUS W/ TWL XL LVL3 (GOWN DISPOSABLE) ×2 IMPLANT
HOLDER FOLEY CATH W/STRAP (MISCELLANEOUS) ×1 IMPLANT
HOOD PEEL AWAY T7 (MISCELLANEOUS) ×3 IMPLANT
KIT TURNOVER KIT A (KITS) ×1 IMPLANT
MANIFOLD NEPTUNE II (INSTRUMENTS) ×1 IMPLANT
MARKER SKIN DUAL TIP RULER LAB (MISCELLANEOUS) ×1 IMPLANT
NS IRRIG 1000ML POUR BTL (IV SOLUTION) ×1 IMPLANT
PACK TOTAL KNEE CUSTOM (KITS) ×1 IMPLANT
PENCIL SMOKE EVACUATOR (MISCELLANEOUS) ×1 IMPLANT
PIN DRILL HDLS TROCAR 75 4PK (PIN) IMPLANT
SCREW HEADED 33MM KNEE (MISCELLANEOUS) IMPLANT
SET HNDPC FAN SPRY TIP SCT (DISPOSABLE) ×1 IMPLANT
SOLUTION IRRIG SURGIPHOR (IV SOLUTION) IMPLANT
SOLUTION PRONTOSAN WOUND 350ML (IRRIGATION / IRRIGATOR) IMPLANT
STEM POLY PAT PLY 32M KNEE (Knees) IMPLANT
STEM TIB ST PERS 14+30 (Stem) IMPLANT
STEM TIBIA 5 DEG SZ E L KNEE (Knees) IMPLANT
STEM TIBIAL SZ6-7 EF 12 LT (Knees) IMPLANT
STRIP CLOSURE SKIN 1/2X4 (GAUZE/BANDAGES/DRESSINGS) ×1 IMPLANT
SUT MNCRL AB 3-0 PS2 18 (SUTURE) ×1 IMPLANT
SUT STRATAFIX 14 PDO 48 VLT (SUTURE) ×1 IMPLANT
SUT VIC AB 2-0 CT2 27 (SUTURE) ×2 IMPLANT
SUTURE STRATFX 0 PDS 27 VIOLET (SUTURE) ×1 IMPLANT
TRAY FOLEY MTR SLVR 14FR STAT (SET/KITS/TRAYS/PACK) IMPLANT
TUBE SUCTION HIGH CAP CLEAR NV (SUCTIONS) ×1 IMPLANT
UNDERPAD 30X36 HEAVY ABSORB (UNDERPADS AND DIAPERS) ×1 IMPLANT
WRAP KNEE MAXI GEL POST OP (GAUZE/BANDAGES/DRESSINGS) ×1 IMPLANT

## 2024-01-23 NOTE — Interval H&P Note (Signed)

## 2024-01-23 NOTE — Discharge Instructions (Addendum)
 INSTRUCTIONS AFTER JOINT REPLACEMENT   Remove items at home which could result in a fall. This includes throw rugs or furniture in walking pathways ICE to the affected joint every three hours while awake for 30 minutes at a time, for at least the first 3-5 days, and then as needed for pain and swelling.  Continue to use ice for pain and swelling. You may notice swelling that will progress down to the foot and ankle.  This is normal after surgery.  Elevate your leg when you are not up walking on it.   Continue to use the breathing machine you got in the hospital (incentive spirometer) which will help keep your temperature down.  It is common for your temperature to cycle up and down following surgery, especially at night when you are not up moving around and exerting yourself.  The breathing machine keeps your lungs expanded and your temperature down.  DIET:  As you were doing prior to hospitalization, we recommend a well-balanced diet.  DRESSING / WOUND CARE / SHOWERING:  Keep the surgical dressing until follow up.  The dressing is water  proof, so you can shower without any extra covering.  IF THE DRESSING FALLS OFF or the wound gets wet inside, change the dressing with sterile gauze.  Please use good hand washing techniques before changing the dressing.  Do not use any lotions or creams on the incision until instructed by your surgeon.    ACTIVITY  Increase activity slowly as tolerated, but follow the weight bearing instructions below.   No driving for 6 [place]rill or until further direction given by your physician.  You cannot drive while taking narcotics.  No lifting or carrying greater than 10 lbs. until further directed by your surgeon. Avoid periods of inactivity such as sitting longer than an hour when not asleep. This helps prevent blood clots.  You may return to work once you are authorized by your doctor.   WEIGHT BEARING: Weight bearing as tolerated with assist device (walker, cane, etc) as  directed, use it as long as suggested by your surgeon or therapist, typically at least 4-6 [place]umb[place].  EXERCISES  Results after joint replacement surgery are often greatly improved when you follow the exercise, range of motion and muscle strengthening exercises prescribed by your doctor. Safety measures are also important to protect the joint from further injury. Any time any of these exercises cause you to have increased pain or swelling, decrease what you are doing until you are comfortable again and then slowly increase them. If you have problems or questions, call your caregiver or physical therapist for advice.   Rehabilitation is important following a joint replacement. After just a few days of immobilization, the muscles of the leg can become weakened and shrink (atrophy).  These exercises are designed to build up the tone and strength of the thigh and leg muscles and to improve motion. Often times heat used for twenty to thirty minutes before working out will loosen up your tissues and help with improving the range of motion but do not use heat for the first two Seyfried following surgery (sometimes heat can increase post-operative swelling).   These exercises can be done on a training (exercise) mat, on the floor, on a table or on a bed. Use whatever works the best and is most comfortable for you.    Use music or television while you are exercising so that the exercises are a pleasant break in your day. This will make your life  better with the exercises acting as a break in your routine that you can look forward to.   Perform all exercises about fifteen times, three times per day or as directed.  You should exercise both the operative leg and the other leg as well.  Exercises include:   Quad Sets - Tighten up the muscle on the front of the thigh (Quad) and hold for 5-10 seconds.   Straight Leg Raises - With your knee straight (if you were given a brace, keep it on), lift the leg to 60 degrees, hold  for 3 seconds, and slowly lower the leg.  Perform this exercise against resistance later as your leg gets stronger.  Leg Slides: Lying on your back, slowly slide your foot toward your buttocks, bending your knee up off the floor (only go as far as is comfortable). Then slowly slide your foot back down until your leg is flat on the floor again.  Angel Wings: Lying on your back spread your legs to the side as far apart as you can without causing discomfort.  Hamstring Strength:  Lying on your back, push your heel against the floor with your leg straight by tightening up the muscles of your buttocks.  Repeat, but this time bend your knee to a comfortable angle, and push your heel against the floor.  You may put a pillow under the heel to make it more comfortable if necessary.   A rehabilitation program following joint replacement surgery can speed recovery and prevent re-injury in the future due to weakened muscles. Contact your doctor or a physical therapist for more information on knee rehabilitation.   CONSTIPATION:  Constipation is defined medically as fewer than three stools per week and severe constipation as less than one stool per week.  Even if you have a regular bowel pattern at home, your normal regimen is likely to be disrupted due to multiple reasons following surgery.  Combination of anesthesia, postoperative narcotics, change in appetite and fluid intake all can affect your bowels.   YOU MUST use at least one of the following options; they are listed in order of increasing strength to get the job done.  They are all available over the counter, and you may need to use some, POSSIBLY even all of these options:    Drink plenty of fluids (prune juice may be helpful) and high fiber foods Colace 100 mg by mouth twice a day  Senokot for constipation as directed and as needed Dulcolax (bisacodyl), take with full glass of water   Miralax (polyethylene glycol) once or twice a day as needed.  If you  have tried all these things and are unable to have a bowel movement in the first 3-4 days after surgery call either your surgeon or your primary doctor.    If you experience loose stools or diarrhea, hold the medications until you stool forms back up.  If your symptoms do not get better within 1 week or if they get worse, check with your doctor.  If you experience the worst abdominal pain ever or develop nausea or vomiting, please contact the office immediately for further recommendations for treatment.  ITCHING:  If you experience itching with your medications, try taking only a single pain pill, or even half a pain pill at a time.  You can also use Benadryl  over the counter for itching or also to help with sleep.   TED HOSE STOCKINGS:  Use stockings on both legs until for at least 2 Mazor or  as directed by physician office. They may be removed at night for sleeping.  MEDICATIONS:  See your medication summary on the "After Visit Summary" that nursing will review with you.  You may have some home medications which will be placed on hold until you complete the course of blood thinner medication.  It is important for you to complete the blood thinner medication as prescribed.  Blood clot prevention (DVT Prophylaxis): After surgery you are at an increased risk for a blood clot.  You were prescribed a blood thinner, Eliquis 2.5mg , to be taken twice daily for a total of 4 [place] from surgery to help reduce your risk of getting a blood clot.  Signs of a pulmonary embolus (blood clot in the lungs) include sudden short of breath, feeling lightheaded or dizzy, chest pain with a deep breath, rapid pulse rapid breathing.  Signs of a blood clot in your arms or legs include new unexplained swelling and cramping, warm, red or darkened skin around the painful area.  Please call the office or 911 right away if these signs or symptoms develop.  PRECAUTIONS:   If you experience chest pain or shortness of breath - call  911 immediately for transfer to the hospital emergency department.   If you develop a fever greater that 101 F, purulent drainage from wound, increased redness or drainage from wound, foul odor from the wound/dressing, or calf pain - CONTACT YOUR SURGEON.                                                   FOLLOW-UP APPOINTMENTS:  If you do not already have a post-op appointment, please call the office for an appointment to be seen by your surgeon.  Guidelines for how soon to be seen are listed in your "After Visit Summary", but are typically between 2-3 Majeed after surgery.  If you have a specialized bandage, you may be told to follow up 1 week after surgery.  OTHER INSTRUCTIONS:  Knee Replacement:  Do not place pillow under knee, focus on keeping the knee straight while resting.  Place foam block, curve side up under heel at all times except when walking.  DO NOT modify, tear, cut, or change the foam block in any way.  POST-OPERATIVE OPIOID TAPER INSTRUCTIONS: It is important to wean off of your opioid medication as soon as possible. If you do not need pain medication after your surgery it is ok to stop day one. Opioids include: Codeine, Hydrocodone (Norco, Vicodin), Oxycodone (Percocet , oxycontin ) and hydromorphone  amongst others.  Long term and even short term use of opiods can cause: Increased pain response Dependence Constipation Depression Respiratory depression And more.  Withdrawal symptoms can include Flu like symptoms Nausea, vomiting And more Techniques to manage these symptoms Hydrate well Eat regular healthy meals Stay active Use relaxation techniques(deep breathing, meditating, yoga) Do Not substitute Alcohol to help with tapering If you have been on opioids for less than two Gaeta and do not have pain than it is ok to stop all together.  Plan to wean off of opioids This plan should start within one week post op of your joint replacement. Maintain the same interval or time  between taking each dose and first decrease the dose.  Cut the total daily intake of opioids by one tablet each day Next start to increase the time between  doses. The last dose that should be eliminated is the evening dose.   MAKE SURE YOU:  Understand these instructions.  Get help right away if you are not doing well or get worse.    Thank you for letting us  be a part of your medical care team.  It is a privilege we respect greatly.  We hope these instructions will help you stay on track for a fast and full recovery!        ===========================================================  Information on my medicine - ELIQUIS (apixaban)  This medication education was reviewed with me or my healthcare representative as part of my discharge preparation.    Why was Eliquis prescribed for you? Eliquis was prescribed for you to reduce the risk of blood clots forming after orthopedic surgery.    What do You need to know about Eliquis? Take your Eliquis TWICE DAILY - one tablet in the morning and one tablet in the evening with or without food.  It would be best to take the dose about the same time each day.  If you have difficulty swallowing the tablet whole please discuss with your pharmacist how to take the medication safely.  Take Eliquis exactly as prescribed by your doctor and DO NOT stop taking Eliquis without talking to the doctor who prescribed the medication.  Stopping without other medication to take the place of Eliquis may increase your risk of developing a clot.  After discharge, you should have regular check-up appointments with your healthcare provider that is prescribing your Eliquis.  What do you do if you miss a dose? If a dose of ELIQUIS is not taken at the scheduled time, take it as soon as possible on the same day and twice-daily administration should be resumed.  The dose should not be doubled to make up for a missed dose.  Do not take more than one tablet of  ELIQUIS at the same time.  Important Safety Information A possible side effect of Eliquis is bleeding. You should call your healthcare provider right away if you experience any of the following: Bleeding from an injury or your nose that does not stop. Unusual colored urine (red or dark brown) or unusual colored stools (red or black). Unusual bruising for unknown reasons. A serious fall or if you hit your head (even if there is no bleeding).  Some medicines may interact with Eliquis and might increase your risk of bleeding or clotting while on Eliquis. To help avoid this, consult your healthcare provider or pharmacist prior to using any new prescription or non-prescription medications, including herbals, vitamins, non-steroidal anti-inflammatory drugs (NSAIDs) and supplements.  This website has more information on Eliquis (apixaban): http://www.eliquis.com/eliquis/home

## 2024-01-23 NOTE — Op Note (Signed)
 DATE OF SURGERY:  01/23/2024 TIME: 9:01 AM  PATIENT NAME:  [name]   AGE: 78 y.o.    PRE-OPERATIVE DIAGNOSIS: Posttraumatic left knee osteoarthritis  POST-OPERATIVE DIAGNOSIS:  Same  PROCEDURE: Cemented left total Knee Arthroplasty  SURGEON:  Feliza Diven A Keanna Tugwell, MD   ASSISTANT: Bernarda Mclean, PA-C, present and scrubbed throughout the case, critical for assistance with exposure, retraction, instrumentation, and closure.   OPERATIVE IMPLANTS:  Cemented size 6 standard CR femur, size EE left tibial baseplate with 30 mm stem extension, 12 mm MC poly, 32 mm cemented three-peg patella Implant Name Type Inv. Item Serial No. Manufacturer Lot No. LRB No. Used Action  CEMENT BONE REFOBACIN [id number] US  - [id number] Cement CEMENT BONE REFOBACIN [id number] US   [employer](ORTH,TRAU,BIO,SG) [id number] Left 1 Implanted  STEM POLY PAT PLY 66M KNEE - [id number] Knees STEM POLY PAT PLY 66M KNEE  [employer](ORTH,TRAU,BIO,SG) [id number] Left 1 Implanted  STEM TIB ST PERS 14+30 - [id number] Stem STEM TIB ST PERS 14+30  [employer](ORTH,TRAU,BIO,SG) [id number] Left 1 Implanted  STEM TIBIA 5 DEG SZ E L KNEE - [id number] Knees STEM TIBIA 5 DEG SZ E L KNEE  [employer](ORTH,TRAU,BIO,SG) [id number] Left 1 Implanted  FEMUR CMTD CR STD SZ 6 LT KNEE - [id number] Joint FEMUR CMTD CR STD SZ 6 LT KNEE  [employer](ORTH,TRAU,BIO,SG) [id number] Left 1 Implanted  STEM TIBIAL SZ6-7 EF 12 LT - [id number] Knees STEM TIBIAL SZ6-7 EF 12 LT  [employer](ORTH,TRAU,BIO,SG) [id number] Left 1 Implanted      PREOPERATIVE INDICATIONS:  [name] is a 78 y.o. year old female with end stage bone on bone degenerative arthritis of the knee who failed conservative treatment, including injections, antiinflammatories, activity modification, and assistive devices, and had significant impairment of their activities of daily living, and elected for Total Knee Arthroplasty.   The risks, benefits, and alternatives were discussed  at length including but not limited to the risks of infection, bleeding, nerve injury, stiffness, blood clots, the need for revision surgery, cardiopulmonary complications, among others, and they were willing to proceed.  OPERATIVE FINDINGS AND UNIQUE ASPECTS OF THE CASE: Patient had prior posterior  medial tibial plateau fracture healed with osseous union but also partly with fibrous union.  Elected to resect additional 2 mm off the proximal tibia and resected some of the fibrous tissue for cement interdigitation  ESTIMATED BLOOD LOSS: 50cc  OPERATIVE DESCRIPTION:   Once adequate anesthesia was induced, preoperative antibiotics, 2 gm of ancef , also 1 g of vancomycin  was started in preop but only about half was given as patient became very itchy.,1 gm of Tranexamic Acid , and 8 mg of Decadron  administered, the patient was positioned supine with a left thigh tourniquet placed.  The left lower extremity was prepped and draped in sterile fashion.  A time-  out was performed identifying the patient, planned procedure, and the appropriate extremity.     The leg was  exsanguinated, tourniquet elevated to 250 mmHg.  A midline incision was  made followed by median parapatellar arthrotomy. Anterior horn of the medial meniscus was released and resected. A medial release was performed, the infrapatellar fat pad was resected with care taken to protect the patellar tendon. The suprapatellar fat was removed to exposed the distal anterior femur. The anterior horn of the lateral meniscus and ACL were released.    Following initial  exposure, I first started with the femur  The femoral  canal was opened with a drill, canal was suctioned to try to prevent  fat emboli.  An  intramedullary rod was passed set at 6 degrees valgus, 10 mm. The distal femur was resected.  Following this resection, the tibia was  subluxated anteriorly.  Using the extramedullary guide, 10 mm of bone was resected off   the proximal lateral tibia.   After this resection of still a lot of fibrous tissue on the medial side resected additional 2 mm which got down mostly to subchondral bone with a little bit of fibrous tissue still remaining.  We confirmed the gap would be  stable medially and laterally with a size 10mm spacer block as well as confirmed that the tibial cut was perpendicular in the coronal plane, checking with an alignment rod.    Once this was done, the posterior femoral referencing femoral sizer was placed under to the posterior condyles with 3 degrees of external rotational which was parallel to the transepicondylar axis and perpendicular to [employer]. The femur was sized to be a size 6 in the anterior-  posterior dimension. The  anterior, posterior, and  chamfer cuts were made without difficulty nor   notching making certain that I was along the anterior cortex to help  with flexion gap stability. Next a laminar spreader was placed with the knee in flexion and the medial lateral menisci were resected.  5 cc of the Exparel  mixture was injected in the medial side of the back of the knee and 3 cc in the lateral side.  1/2 inch curved osteotome was used to resect posterior osteophyte that was then removed with a pituitary rongeur.       At this point, the tibia was sized to be a size E.  The size E tray was  then pinned in position. Trial reduction was now carried with a 6 femur, E tibia, a 12 mm MC insert.  The knee had full extension and was stable to varus valgus stress in extension.  The knee was stable in flexion and the PCL was left intact.  Attention was next directed to the patella.  Precut  measurement was noted to be 21 mm.  I resected down to 13 mm and used a  32mm patellar button to restore patellar height as well as cover the cut surface.     The patella lug holes were drilled and a 32mm patella poly trial was placed.    The knee was brought to full extension with good flexion stability with the patella tracking  through the trochlea without application of pressure.     Next the femoral component was again assessed and determined to be seated and appropriately lateralized.  The femoral lug holes were drilled.  The femoral component was then removed. Tibial component was again assessed and felt to be seated and appropriately rotated with the medial third of the tubercle. The tibia was then drilled, and keel punched.     Final components were  opened and antibiotic cement was mixed.      Final implants were then  cemented onto cleaned and dried cut surfaces of bone with the knee brought to extension with a 12 mm MC poly.  The knee was irrigated with sterile Betadine  diluted in saline as well as pulse lavage normal saline.  The synovial lining was  then injected a dilute Exparel  with 30cc of 0.25% marcaine  with epinephrine.         Once the cement had fully cured, excess cement was removed throughout the knee.  I confirmed that I was satisfied  with the range of motion and stability, and the final 12mm MC poly insert was chosen.  It was placed into the knee.         The tourniquet had been let down at 66 minutes.  No significant hemostasis was required.  The medial parapatellar arthrotomy was then reapproximated using #1 Stratafix sutures with the knee  in flexion.  The remaining wound was closed with 0 stratafix, 2-0 Vicryl, and running 3-0 Monocryl. The knee was cleaned, dried, dressed sterilely using Dermabond and   Aquacel dressing.  The patient was then brought to recovery room in stable condition, tolerating the procedure  well. There were no complications.   Post op recs: WB: WBAT Abx: ancef  Imaging: PACU xrays DVT prophylaxis: Eliquis  2.5 mg BID x4 weeks (history of DVT) Follow up: 2 weeks after surgery for a wound check with Dr. Edna at [facility].  Address: [address], [place], [place] [address]  Office Phone: [phone]  Toribio Edna, MD Orthopaedic  Surgery

## 2024-01-23 NOTE — Progress Notes (Signed)
 Orthopedic Tech Progress Note Patient DetailsHlee Fringer [name] [birth year] [id number] Applied bone foam per order.  Ortho Devices Type of Ortho Device: Bone foam zero knee Ortho Device/Splint Location: LLE Ortho Device/Splint Interventions: Ordered, Application, Adjustment   Post Interventions Patient Tolerated: Well Instructions Provided: Adjustment of device, Care of device, Poper ambulation with device  Morna Pink 01/23/2024, 10:24 AM

## 2024-01-23 NOTE — Anesthesia Procedure Notes (Signed)
 Spinal  Patient location during procedure: OR Start time: 01/23/2024 7:24 AM End time: 01/23/2024 7:24 AM Reason for block: surgical anesthesia Staffing Performed: anesthesiologist  Anesthesiologist: Waddell Lauraine NOVAK, MD Performed by: Waddell Lauraine NOVAK, MD Authorized by: Waddell Lauraine NOVAK, MD   Preanesthetic Checklist Completed: patient identified, IV checked, site marked, risks and benefits discussed, surgical consent, monitors and equipment checked, pre-op evaluation and timeout performed Spinal Block Patient position: sitting Prep: DuraPrep Patient monitoring: heart rate, cardiac monitor, continuous pulse ox and blood pressure Approach: midline Location: L3-4 Injection technique: single-shot Needle Needle type: Pencan  Needle gauge: 24 G Needle length: 9 cm Assessment Sensory level: Pending. Events: CSF return Additional Notes Patient identified. Risks/Benefits/Options discussed with patient including but not limited to bleeding, infection, nerve damage, paralysis, failed block, blood pressure changes. Confirmed with bedside nurse the patient's most recent platelet count. Confirmed with patient that they are not currently taking any anticoagulation, have any bleeding history or any family history of bleeding disorders. Sterile technique was used throughout the entire procedure as documented above. See intraoperative record for vital signs throughout.   CSF aspirated before and after injection.  CANDIE Mcbride, MD

## 2024-01-23 NOTE — Anesthesia Procedure Notes (Signed)
 Anesthesia Regional Block: Adductor canal block   Pre-Anesthetic Checklist: , timeout performed,  Correct Patient, Correct Site, Correct Laterality,  Correct Procedure, Correct Position, site marked,  Risks and benefits discussed,  Surgical consent,  Pre-op evaluation,  At surgeon's request and post-op pain management  Laterality: Lower and Left  Prep: chloraprep       Needles:  Injection technique: Single-shot  Needle Type: Echogenic Stimulator Needle     Needle Length: 10cm  Needle Gauge: 20     Additional Needles:   Procedures:,,,, ultrasound used (permanent image in chart),, #20gu IV placed    Narrative:  Start time: 01/23/2024 7:08 AM End time: 01/23/2024 7:08 AM Injection made incrementally with aspirations every 5 mL.  Performed by: Personally  Anesthesiologist: Waddell Lauraine NOVAK, MD  Additional Notes: Timeout performed with bedside RN - Name, DOB, allergies and laterality confirmed by the patient and RN. Surgical marking performed/confirmed. Anticoagulation status and most recent platelet count reviewed. Patient placed in a frog-leg position and sedation (as documented by RN) given via PIV. Peripheral nerve block performed as documented above. VSS throughout (see Flowchart).

## 2024-01-23 NOTE — Anesthesia Preprocedure Evaluation (Addendum)
 Anesthesia Evaluation  Patient identified by MRN, date of birth, ID band Patient awake    Reviewed: Allergy & Precautions, NPO status , Patient's Chart, lab work & pertinent test results  History of Anesthesia Complications Negative for: history of anesthetic complications  Airway Mallampati: II  TM Distance: >3 FB Neck ROM: Full    Dental  (+) Dental Advisory Given, Teeth Intact   Pulmonary sleep apnea and Continuous Positive Airway Pressure Ventilation    breath sounds clear to auscultation       Cardiovascular hypertension, + Peripheral Vascular Disease (Mild Carotid Stenosis) and + DVT   Rhythm:Regular Rate:Normal  TTE (2025): 1. Left ventricular ejection fraction, by estimation, is >75%. The left  ventricle has hyperdynamic function. The left ventricle has no regional  wall motion abnormalities. Left ventricular diastolic parameters are  consistent with Grade I diastolic dysfunction (impaired relaxation).   2. Right ventricular systolic function is normal. The right ventricular  size is normal. Tricuspid regurgitation signal is inadequate for assessing  PA pressure.   3. The mitral valve is normal in structure. Mild mitral valve  regurgitation. No evidence of mitral stenosis.   4. The aortic valve is tricuspid. Aortic valve regurgitation is mild. No  aortic stenosis is present.   5. The inferior vena cava is normal in size with greater than 50%  respiratory variability, suggesting right atrial pressure of 3 mmHg.   6. Increased flow velocities may be secondary to anemia, thyrotoxicosis,  hyperdynamic or high flow state.      Neuro/Psych   Anxiety Depression       GI/Hepatic ,GERD  ,,  Endo/Other  diabetes, Type 2Hypothyroidism    Renal/GU      Musculoskeletal  (+) Arthritis ,  Fibromyalgia -  Abdominal   Peds  Hematology   Anesthesia Other Findings   Reproductive/Obstetrics                               Anesthesia Physical Anesthesia Plan  ASA: 3  Anesthesia Plan: Spinal   Post-op Pain Management:    Induction: Intravenous  PONV Risk Score and Plan: 2 and Ondansetron , Treatment may vary due to age or medical condition and Propofol  infusion  Airway Management Planned: Simple Face Mask  Additional Equipment:   Intra-op Plan:   Post-operative Plan:   Informed Consent:      Dental advisory given  Plan Discussed with: CRNA and Surgeon  Anesthesia Plan Comments:          Anesthesia Quick Evaluation

## 2024-01-23 NOTE — Evaluation (Signed)
 Physical Therapy Evaluation Patient Details Name: [name] MRN: [id number] DOB: Sep 23, 1945 Today's Date: 01/23/2024  History of Present Illness  78 yo female s/p L TKA on 01/23/24. PMH: closed L tibial plateau fx 2024, DOE, CVA, trochanteric bursitis, HTN, fibromyalgia, OSA, DM, DVT  Clinical Impression  Pt is s/p TKA resulting in the deficits listed below (see PT Problem List).  Pt feeling well this pm, beginning to have some incr pain, RN notified.  Amb 70' with RW and min assist, anticipate steady progress in acute setting   Pt will benefit from acute skilled PT to increase their independence and safety with mobility to allow discharge.          If plan is discharge home, recommend the following: A little help with walking and/or transfers;A little help with bathing/dressing/bathroom;Help with stairs or ramp for entrance;Assist for transportation;Assistance with cooking/housework   Can travel by private vehicle        Equipment Recommendations None recommended by PT  Recommendations for Other Services       Functional Status Assessment Patient has had a recent decline in their functional status and demonstrates the ability to make significant improvements in function in a reasonable and predictable amount of time.     Precautions / Restrictions Precautions Precautions: Fall;Knee Restrictions Weight Bearing Restrictions Per Provider Order: No Other Position/Activity Restrictions: WBAT      Mobility  Bed Mobility Overal bed mobility: Needs Assistance Bed Mobility: Supine to Sit     Supine to sit: Supervision     General bed mobility comments: for lines    Transfers Overall transfer level: Needs assistance Equipment used: Rolling walker (2 wheels) Transfers: Sit to/from Stand Sit to Stand: Contact guard assist, Min assist           General transfer comment: cues for hand placement and LLE position    Ambulation/Gait Ambulation/Gait assistance:  Contact guard assist, Min assist Gait Distance (Feet): 50 Feet Assistive device: Rolling walker (2 wheels) Gait Pattern/deviations: Step-to pattern       General Gait Details: cues for sequence and RW position  Stairs            Wheelchair Mobility     Tilt Bed    Modified Rankin (Stroke Patients Only)       Balance                                             Pertinent Vitals/Pain Pain Assessment Pain Assessment: Faces Faces Pain Scale: Hurts little more Pain Location: L knee Pain Descriptors / Indicators: Sore Pain Intervention(s): Limited activity within patient's tolerance, Monitored during session, Premedicated before session    Home Living Family/patient expects to be discharged to:: Private residence Living Arrangements: Spouse/significant other;Children;Other relatives (dtr  & grd-dtr) Available Help at Discharge: Family Type of Home: Apartment Home Access: Level entry       Home Layout: One level Home Equipment: Agricultural Consultant (2 wheels);Cane - single point;Toilet riser;BSC/3in1;Wheelchair - manual      Prior Function Prior Level of Function : Independent/Modified Independent             Mobility Comments: amb with cane       Extremity/Trunk Assessment   Upper Extremity Assessment Upper Extremity Assessment: Overall WFL for tasks assessed    Lower Extremity Assessment Lower Extremity Assessment: LLE deficits/detail LLE Deficits / Details: ankle  WFL, quads 3/5, hip flexion 3+/5       Communication   Communication Communication: No apparent difficulties    Cognition Arousal: Alert Behavior During Therapy: WFL for tasks assessed/performed   PT - Cognitive impairments: No apparent impairments                         Following commands: Intact       Cueing Cueing Techniques: Verbal cues     General Comments      Exercises Total Joint Exercises Ankle Circles/Pumps: AROM, Both, 10 reps    Assessment/Plan    PT Assessment Patient needs continued PT services  PT Problem List Decreased strength;Decreased range of motion;Decreased activity tolerance;Decreased balance;Pain;Decreased knowledge of use of DME;Decreased mobility       PT Treatment Interventions DME instruction;Gait training;Functional mobility training;Therapeutic activities;Patient/family education;Therapeutic exercise    PT Goals (Current goals can be found in the Care Plan section)  Acute Rehab PT Goals PT Goal Formulation: With patient Time For Goal Achievement: 01/30/24 Potential to Achieve Goals: Good    Frequency 7X/week     Co-evaluation               AM-PAC PT 6 Clicks Mobility  Outcome Measure Help needed turning from your back to your side while in a flat bed without using bedrails?: A Little Help needed moving from lying on your back to sitting on the side of a flat bed without using bedrails?: A Little Help needed moving to and from a bed to a chair (including a wheelchair)?: A Little Help needed standing up from a chair using your arms (e.g., wheelchair or bedside chair)?: A Little Help needed to walk in hospital room?: A Little Help needed climbing 3-5 steps with a railing? : A Lot 6 Click Score: 17    End of Session Equipment Utilized During Treatment: Gait belt Activity Tolerance: Patient tolerated treatment well Patient left: with call bell/phone within reach;in chair;with chair alarm set Nurse Communication: Mobility status;Patient requests pain meds PT Visit Diagnosis: Other abnormalities of gait and mobility (R26.89)    Time: 8490-8466 PT Time Calculation (min) (ACUTE ONLY): 24 min   Charges:   PT Evaluation $PT Eval Low Complexity: 1 Low PT Treatments $Gait Training: 8-22 mins PT General Charges $$ ACUTE PT VISIT: 1 Visit         Elaura Calix, PT  Acute Rehab Dept [facility]) [phone]  01/23/2024   [facility] 01/23/2024, 3:39 PM

## 2024-01-23 NOTE — Progress Notes (Signed)
   01/23/24 2029  BiPAP/CPAP/SIPAP  BiPAP/CPAP/SIPAP Pt Type Adult  Reason BIPAP/CPAP not in use Non-compliant (Patient states she does not want to wear a CPAP tonight. RT advised to call if she changes her mind.)

## 2024-01-23 NOTE — Anesthesia Procedure Notes (Signed)
 Date/Time: 01/23/2024 7:18 AM  Performed by: Therisa Doyal CROME, CRNAOxygen Delivery Method: Simple face mask

## 2024-01-23 NOTE — Anesthesia Postprocedure Evaluation (Signed)
 Anesthesia Post Note  Patient: [name]  Procedure(s) Performed: ARTHROPLASTY, KNEE, TOTAL (Left: Knee)     Patient location during evaluation: PACU Anesthesia Type: Spinal Level of consciousness: awake and alert Pain management: pain level controlled Vital Signs Assessment: post-procedure vital signs reviewed and stable Respiratory status: spontaneous breathing Cardiovascular status: blood pressure returned to baseline Postop Assessment: no apparent nausea or vomiting Anesthetic complications: no   No notable events documented.             Lauraine KATHEE [place]

## 2024-01-23 NOTE — Transfer of Care (Signed)
 Immediate Anesthesia Transfer of Care Note  Patient: [name]  Procedure(s) Performed: ARTHROPLASTY, KNEE, TOTAL (Left: Knee)  Patient Location: PACU  Anesthesia Type:Spinal  Level of Consciousness: awake and alert   Airway & Oxygen Therapy: Patient Spontanous Breathing and Patient connected to face mask oxygen  Post-op Assessment: Report given to RN and Post -op Vital signs reviewed and stable  Post vital signs: Reviewed and stable  Last Vitals:  Vitals Value Taken Time  BP 131/64 01/23/24 09:23  Temp    Pulse 66 01/23/24 09:26  Resp 14 01/23/24 09:26  SpO2 100 % 01/23/24 09:26  Vitals shown include unfiled device data.  Last Pain:  Vitals:   01/23/24 0548  TempSrc:   PainSc: 0-No pain         Complications: No notable events documented.

## 2024-01-23 NOTE — OR Nursing (Signed)
 Patient called out stating antibiotics were making her itchy. No rash noted, face slightly red. Denies SHOB, feeling flushed- just c/o itching. Patient asking for benadryl . Dr. Edna at bedside. Vanc stopped at 0649, IV line flushed. Anesthesia called and informed. Anesthesia to come assess.

## 2024-01-24 ENCOUNTER — Encounter (HOSPITAL_COMMUNITY): Payer: Self-pay | Admitting: Orthopedic Surgery

## 2024-01-24 DIAGNOSIS — M1712 Unilateral primary osteoarthritis, left knee: Secondary | ICD-10-CM | POA: Diagnosis not present

## 2024-01-24 DIAGNOSIS — Z8673 Personal history of transient ischemic attack (TIA), and cerebral infarction without residual deficits: Secondary | ICD-10-CM | POA: Diagnosis not present

## 2024-01-24 DIAGNOSIS — E039 Hypothyroidism, unspecified: Secondary | ICD-10-CM | POA: Diagnosis not present

## 2024-01-24 DIAGNOSIS — Z79899 Other long term (current) drug therapy: Secondary | ICD-10-CM | POA: Diagnosis not present

## 2024-01-24 DIAGNOSIS — I10 Essential (primary) hypertension: Secondary | ICD-10-CM | POA: Diagnosis not present

## 2024-01-24 DIAGNOSIS — E119 Type 2 diabetes mellitus without complications: Secondary | ICD-10-CM | POA: Diagnosis not present

## 2024-01-24 DIAGNOSIS — Z794 Long term (current) use of insulin: Secondary | ICD-10-CM | POA: Diagnosis not present

## 2024-01-24 LAB — GLUCOSE, CAPILLARY
Glucose-Capillary: 144 mg/dL — ABNORMAL HIGH (ref 70–99)
Glucose-Capillary: 151 mg/dL — ABNORMAL HIGH (ref 70–99)
Glucose-Capillary: 139 mg/dL — ABNORMAL HIGH (ref 70–99)

## 2024-01-24 LAB — BASIC METABOLIC PANEL WITH GFR
Anion gap: 8 (ref 5–15)
BUN: 17 mg/dL (ref 8–23)
CO2: 23 mmol/L (ref 22–32)
Calcium: 8.7 mg/dL — ABNORMAL LOW (ref 8.9–10.3)
Chloride: 104 mmol/L (ref 98–111)
Creatinine, Ser: 0.91 mg/dL (ref 0.44–1.00)
GFR, Estimated: >60 mL/min (ref >60)
Glucose, Bld: 243 mg/dL — ABNORMAL HIGH (ref 70–99)
Potassium: 4.6 mmol/L (ref 3.5–5.1)
Sodium: 135 mmol/L (ref 135–145)

## 2024-01-24 LAB — CBC
HCT: 28.3 % — ABNORMAL LOW (ref 36.0–46.0)
Hemoglobin: 8.7 g/dL — ABNORMAL LOW (ref 12.0–15.0)
MCH: 24.1 pg — ABNORMAL LOW (ref 26.0–34.0)
MCHC: 30.7 g/dL (ref 30.0–36.0)
MCV: 78.4 fL — ABNORMAL LOW (ref 80.0–100.0)
Platelets: 199 K/uL (ref 150–400)
RBC: 3.61 MIL/uL — ABNORMAL LOW (ref 3.87–5.11)
RDW: 15.1 % (ref 11.5–15.5)
WBC: 6.8 K/uL (ref 4.0–10.5)
nRBC: 0 % (ref 0.0–0.2)

## 2024-01-24 NOTE — Plan of Care (Signed)
  Problem: Health Behavior/Discharge Planning: Goal: Ability to manage health-related needs will improve Outcome: Progressing   Problem: Clinical Measurements: Goal: Ability to maintain clinical measurements within normal limits will improve Outcome: Progressing Goal: Will remain free from infection Outcome: Progressing Goal: Diagnostic test results will improve Outcome: Progressing Goal: Respiratory complications will improve Outcome: Progressing Goal: Cardiovascular complication will be avoided Outcome: Progressing   Problem: Activity: Goal: Risk for activity intolerance will decrease Outcome: Adequate for Discharge   Problem: Nutrition: Goal: Adequate nutrition will be maintained Outcome: Adequate for Discharge   Problem: Coping: Goal: Level of anxiety will decrease Outcome: Progressing   Problem: Elimination: Goal: Will not experience complications related to bowel motility Outcome: Progressing Goal: Will not experience complications related to urinary retention Outcome: Progressing   Problem: Pain Managment: Goal: General experience of comfort will improve and/or be controlled Outcome: Progressing   Problem: Safety: Goal: Ability to remain free from injury will improve Outcome: Progressing   Problem: Skin Integrity: Goal: Risk for impaired skin integrity will decrease Outcome: Progressing   Problem: Nutritional: Goal: Maintenance of adequate nutrition will improve Outcome: Completed/Met   Problem: Skin Integrity: Goal: Risk for impaired skin integrity will decrease Outcome: Adequate for Discharge   Problem: Tissue Perfusion: Goal: Adequacy of tissue perfusion will improve Outcome: Adequate for Discharge   Problem: Education: Goal: Knowledge of the prescribed therapeutic regimen will improve Outcome: Progressing Goal: Individualized Educational Video(s) Outcome: Progressing   Problem: Activity: Goal: Ability to avoid complications of mobility  impairment will improve Outcome: Progressing Goal: Range of joint motion will improve Outcome: Progressing   Problem: Clinical Measurements: Goal: Postoperative complications will be avoided or minimized Outcome: Progressing   Problem: Pain Management: Goal: Pain level will decrease with appropriate interventions Outcome: Progressing   Problem: Skin Integrity: Goal: Will show signs of wound healing Outcome: Progressing

## 2024-01-24 NOTE — Discharge Summary (Signed)
 Physician Discharge Summary  Patient ID: [name] MRN: [id number] DOB/AGE: Mar 09, 1946 78 y.o.  Admit date: 01/23/2024 Discharge date: 01/24/2024  Admission Diagnoses:  Primary osteoarthritis of left knee  Discharge Diagnoses:  Principal Problem:   Primary osteoarthritis of left knee   Past Medical History:  Diagnosis Date   Anxiety    Arthritis    Depression    DVT (deep venous thrombosis) (HCC) 08/02/2022   LLE 08/02/22   Essential hypertension    Eustachian tube dysfunction    Fibromyalgia    GERD (gastroesophageal reflux disease)    Hyperlipidemia    Hypothyroid    Mild carotid artery disease    Neuromuscular disorder (HCC)    neuropathy in both feet,   OSA (obstructive sleep apnea)    uses CPAP nightly   Osteopenia    Pneumonia    x several   Stroke (HCC) 2016   Type 2 diabetes mellitus (HCC)     Surgeries: Procedure(s): ARTHROPLASTY, KNEE, TOTAL on 01/23/2024   Consultants (if any):   Discharged Condition: Improved  Hospital Course: [name] is an 78 y.o. female who was admitted 01/23/2024 with a diagnosis of Primary osteoarthritis of left knee and went to the operating room on 01/23/2024 and underwent the above named procedures.    She was given perioperative antibiotics:  Anti-infectives (From admission, onward)    Start     Dose/Rate Route Frequency Ordered Stop   01/24/24 1000  sulfamethoxazole-trimethoprim (BACTRIM DS) 800-160 MG per tablet 1 tablet  Status:  Discontinued        1 tablet Oral Every 12 hours 01/23/24 0722 01/23/24 1103   01/24/24 1000  sulfamethoxazole-trimethoprim (BACTRIM DS) 800-160 MG per tablet 1 tablet        1 tablet Oral Every 12 hours 01/23/24 1103 01/31/24 0959   01/23/24 1400  ceFAZolin  (ANCEF ) IVPB 2g/100 mL premix        2 g 200 mL/hr over 30 Minutes Intravenous Every 6 hours 01/23/24 1040 01/23/24 2030   01/23/24 0600  ceFAZolin  (ANCEF ) IVPB 2g/100 mL premix        2 g 200 mL/hr over 30 Minutes  Intravenous On call to O.R. 01/23/24 0544 01/23/24 0728   01/23/24 0600  vancomycin  (VANCOCIN ) IVPB 1000 mg/200 mL premix        1,000 mg 200 mL/hr over 60 Minutes Intravenous On call to O.R. 01/23/24 0544 01/23/24 0649   01/23/24 0000  cefadroxil (DURICEF) 500 MG capsule  Status:  Discontinued        500 mg Oral 2 times daily 01/23/24 0704 01/23/24    01/23/24 0000  sulfamethoxazole-trimethoprim (BACTRIM DS) 800-160 MG tablet        1 tablet Oral 2 times daily 01/23/24 0721 01/30/24 2359     .  She was given sequential compression devices, early ambulation, and eliquis  for DVT prophylaxis.  She benefited maximally from the hospital stay and there were no complications.    Recent vital signs:  Vitals:   01/24/24 0140 01/24/24 0606  BP: 118/78 123/76  Pulse: 78 71  Resp: 17 16  Temp: 98.2 F (36.8 C) 97.6 F (36.4 C)  SpO2: 95% 97%    Recent laboratory studies:  Lab Results  Component Value Date   HGB 8.7 (L) 01/24/2024   HGB 10.4 (L) 01/10/2024   HGB 11.3 (L) 10/21/2023   Lab Results  Component Value Date   WBC 6.8 01/24/2024   PLT 199 01/24/2024   Lab Results  Component Value Date   INR 1.0 08/12/2008   Lab Results  Component Value Date   NA 135 01/24/2024   K 4.6 01/24/2024   CL 104 01/24/2024   CO2 23 01/24/2024   BUN 17 01/24/2024   CREATININE 0.91 01/24/2024   GLUCOSE 243 (H) 01/24/2024    Discharge Medications:   Allergies as of 01/24/2024       Reactions   Hydrochlorothiazide Itching   Oxycodone  Itching   Pt can tolerate with benadryl         Medication List     STOP taking these medications    acetaminophen  500 MG tablet Commonly known as: TYLENOL    Adult Aspirin  Regimen 81 MG tablet Generic drug: aspirin  EC   traMADol  50 MG tablet Commonly known as: Ultram        TAKE these medications    amLODipine  5 MG tablet Commonly known as: NORVASC  Take 5 mg by mouth daily.   apixaban  2.5 MG Tabs tablet Commonly known as:  Eliquis  Take 1 tablet (2.5 mg total) by mouth 2 (two) times daily.   celecoxib 100 MG capsule Commonly known as: CeleBREX Take 1 capsule (100 mg total) by mouth 2 (two) times daily for 14 days.   cetirizine  10 MG tablet Commonly known as: ZYRTEC  Take 1 tablet (10 mg total) by mouth daily.   diphenhydramine -acetaminophen  25-500 MG Tabs tablet Commonly known as: TYLENOL  PM Take 1-2 tablets by mouth at bedtime as needed (pain/sleep).   escitalopram 10 MG tablet Commonly known as: LEXAPRO Take 10 mg by mouth in the morning.   HumaLOG KwikPen 100 UNIT/ML KwikPen Generic drug: insulin  lispro Inject 8-10 Units into the skin 3 (three) times daily with meals. Sliding scale   HYDROcodone -acetaminophen  5-325 MG tablet Commonly known as: NORCO/VICODIN Take 1 tablet by mouth every 4 (four) hours as needed for up to 7 days for moderate pain (pain score 4-6).   irbesartan  300 MG tablet Commonly known as: AVAPRO  Take 300 mg by mouth daily.   MAGNESIUM  COMPLEX HIGH POTENCY PO Take 300 mg by mouth at bedtime.   methocarbamol  500 MG tablet Commonly known as: ROBAXIN  Take 1 tablet (500 mg total) by mouth every 8 (eight) hours as needed for up to 10 days for muscle spasms. What changed:  when to take this reasons to take this   metoprolol  succinate 50 MG 24 hr tablet Commonly known as: TOPROL -XL Take 50 mg by mouth every evening.   multivitamin with minerals tablet Take 1 tablet by mouth daily. Alive woman 50+   Hair Skin & Nails Tabs Take 6,000 mcg by mouth daily.   omeprazole 20 MG capsule Commonly known as: PRILOSEC Take 20 mg by mouth daily.   ondansetron  4 MG tablet Commonly known as: Zofran  Take 1 tablet (4 mg total) by mouth every 8 (eight) hours as needed for up to 14 days for nausea or vomiting.   polyethylene glycol 17 g packet Commonly known as: MiraLax  Take 17 g by mouth daily.   rosuvastatin  20 MG tablet Commonly known as: CRESTOR  Take 20 mg by mouth at  bedtime.   sulfamethoxazole-trimethoprim 800-160 MG tablet Commonly known as: BACTRIM DS Take 1 tablet by mouth 2 (two) times daily for 7 days.   Tresiba FlexTouch 200 UNIT/ML FlexTouch Pen Generic drug: insulin  degludec Inject 32 Units into the skin daily.   triamterene -hydrochlorothiazide 37.5-25 MG tablet Commonly known as: MAXZIDE-25 Take 1 tablet by mouth daily.   Vitamin B12 1000 MCG Tabs Take 1,000 mcg by  mouth daily.   Vitamin D  50 MCG (2000 UT) tablet Take 2,000 Units by mouth daily.        Diagnostic Studies: DG Knee Left Port Result Date: 01/23/2024 EXAM: 1 OR 2 VIEW(S) XRAY OF THE LEFT KNEE 01/23/2024 09:44:00 AM COMPARISON: None available. CLINICAL HISTORY: 747648 Post-operative state 252351 Post-operative state FINDINGS: BONES AND JOINTS: Status post total knee arthroplasty. Gas noted within the joint space consistent with postsurgical changes. No acute fracture. No focal osseous lesion. No joint dislocation. No significant joint effusion. SOFT TISSUES: The soft tissues are unremarkable. IMPRESSION: 1. Status post left total knee arthroplasty with intra-articular gas consistent with expected postsurgical change. Electronically signed by: Morgane Naveau MD 01/23/2024 09:28 PM EST RP Workstation: [id number]    Disposition: Discharge disposition: 01-Home or Self Care       Discharge Instructions     Call MD / Call 911   Complete by: As directed    If you experience chest pain or shortness of breath, CALL 911 and be transported to the hospital emergency room.  If you develope a fever above 101 F, pus (white drainage) or increased drainage or redness at the wound, or calf pain, call your surgeon's office.   Constipation Prevention   Complete by: As directed    Drink plenty of fluids.  Prune juice may be helpful.  You may use a stool softener, such as Colace (over the counter) 100 mg twice a day.  Use MiraLax  (over the counter) for constipation as needed.   Diet -  low sodium heart healthy   Complete by: As directed    Increase activity slowly as tolerated   Complete by: As directed    Post-operative opioid taper instructions:   Complete by: As directed    POST-OPERATIVE OPIOID TAPER INSTRUCTIONS: It is important to wean off of your opioid medication as soon as possible. If you do not need pain medication after your surgery it is ok to stop day one. Opioids include: Codeine, Hydrocodone (Norco, Vicodin), Oxycodone (Percocet, oxycontin ) and hydromorphone  amongst others.  Long term and even short term use of opiods can cause: Increased pain response Dependence Constipation Depression Respiratory depression And more.  Withdrawal symptoms can include Flu like symptoms Nausea, vomiting And more Techniques to manage these symptoms Hydrate well Eat regular healthy meals Stay active Use relaxation techniques(deep breathing, meditating, yoga) Do Not substitute Alcohol  to help with tapering If you have been on opioids for less than two weeks and do not have pain than it is ok to stop all together.  Plan to wean off of opioids This plan should start within one week post op of your joint replacement. Maintain the same interval or time between taking each dose and first decrease the dose.  Cut the total daily intake of opioids by one tablet each day Next start to increase the time between doses. The last dose that should be eliminated is the evening dose.           Follow-up Information     Edna Toribio LABOR, MD. Go on 02/07/2024.   Specialty: Orthopedic Surgery Why: Your appointment is scheduled for 2:45 Contact information: [address] [place] [address] [phone]         [facility], [place]. Go on 01/26/2024.   Why: your outpatient physical therapy is scheduled for 1:00. Please arrive at 12:30 Contact information: [facility] [address] [place]  [address] [phone]  Discharge Instructions      INSTRUCTIONS AFTER JOINT REPLACEMENT   Remove items at home which could result in a fall. This includes throw rugs or furniture in walking pathways ICE to the affected joint every three hours while awake for 30 minutes at a time, for at least the first 3-5 days, and then as needed for pain and swelling.  Continue to use ice for pain and swelling. You may notice swelling that will progress down to the foot and ankle.  This is normal after surgery.  Elevate your leg when you are not up walking on it.   Continue to use the breathing machine you got in the hospital (incentive spirometer) which will help keep your temperature down.  It is common for your temperature to cycle up and down following surgery, especially at night when you are not up moving around and exerting yourself.  The breathing machine keeps your lungs expanded and your temperature down.  DIET:  As you were doing prior to hospitalization, we recommend a well-balanced diet.  DRESSING / WOUND CARE / SHOWERING:  Keep the surgical dressing until follow up.  The dressing is water proof, so you can shower without any extra covering.  IF THE DRESSING FALLS OFF or the wound gets wet inside, change the dressing with sterile gauze.  Please use good hand washing techniques before changing the dressing.  Do not use any lotions or creams on the incision until instructed by your surgeon.    ACTIVITY  Increase activity slowly as tolerated, but follow the weight bearing instructions below.   No driving for 6 weeks or until further direction given by your physician.  You cannot drive while taking narcotics.  No lifting or carrying greater than 10 lbs. until further directed by your surgeon. Avoid periods of inactivity such as sitting longer than an hour when not asleep. This helps prevent blood clots.  You may return to work once you are authorized by your doctor.    WEIGHT BEARING: Weight bearing as tolerated with assist device (walker, cane, etc) as directed, use it as long as suggested by your surgeon or therapist, typically at least 4-6 weeks.  EXERCISES  Results after joint replacement surgery are often greatly improved when you follow the exercise, range of motion and muscle strengthening exercises prescribed by your doctor. Safety measures are also important to protect the joint from further injury. Any time any of these exercises cause you to have increased pain or swelling, decrease what you are doing until you are comfortable again and then slowly increase them. If you have problems or questions, call your caregiver or physical therapist for advice.   Rehabilitation is important following a joint replacement. After just a few days of immobilization, the muscles of the leg can become weakened and shrink (atrophy).  These exercises are designed to build up the tone and strength of the thigh and leg muscles and to improve motion. Often times heat used for twenty to thirty minutes before working out will loosen up your tissues and help with improving the range of motion but do not use heat for the first two weeks following surgery (sometimes heat can increase post-operative swelling).   These exercises can be done on a training (exercise) mat, on the floor, on a table or on a bed. Use whatever works the best and is most comfortable for you.    Use music or television while you are exercising so that the exercises are a pleasant break in  your day. This will make your life better with the exercises acting as a break in your routine that you can look forward to.   Perform all exercises about fifteen times, three times per day or as directed.  You should exercise both the operative leg and the other leg as well.  Exercises include:   Quad Sets - Tighten up the muscle on the front of the thigh (Quad) and hold for 5-10 seconds.   Straight Leg Raises - With your  knee straight (if you were given a brace, keep it on), lift the leg to 60 degrees, hold for 3 seconds, and slowly lower the leg.  Perform this exercise against resistance later as your leg gets stronger.  Leg Slides: Lying on your back, slowly slide your foot toward your buttocks, bending your knee up off the floor (only go as far as is comfortable). Then slowly slide your foot back down until your leg is flat on the floor again.  Angel Wings: Lying on your back spread your legs to the side as far apart as you can without causing discomfort.  Hamstring Strength:  Lying on your back, push your heel against the floor with your leg straight by tightening up the muscles of your buttocks.  Repeat, but this time bend your knee to a comfortable angle, and push your heel against the floor.  You may put a pillow under the heel to make it more comfortable if necessary.   A rehabilitation program following joint replacement surgery can speed recovery and prevent re-injury in the future due to weakened muscles. Contact your doctor or a physical therapist for more information on knee rehabilitation.   CONSTIPATION:  Constipation is defined medically as fewer than three stools per week and severe constipation as less than one stool per week.  Even if you have a regular bowel pattern at home, your normal regimen is likely to be disrupted due to multiple reasons following surgery.  Combination of anesthesia, postoperative narcotics, change in appetite and fluid intake all can affect your bowels.   YOU MUST use at least one of the following options; they are listed in order of increasing strength to get the job done.  They are all available over the counter, and you may need to use some, POSSIBLY even all of these options:    Drink plenty of fluids (prune juice may be helpful) and high fiber foods Colace 100 mg by mouth twice a day  Senokot for constipation as directed and as needed Dulcolax (bisacodyl ), take with full  glass of water  Miralax  (polyethylene glycol) once or twice a day as needed.  If you have tried all these things and are unable to have a bowel movement in the first 3-4 days after surgery call either your surgeon or your primary doctor.    If you experience loose stools or diarrhea, hold the medications until you stool forms back up.  If your symptoms do not get better within 1 week or if they get worse, check with your doctor.  If you experience the worst abdominal pain ever or develop nausea or vomiting, please contact the office immediately for further recommendations for treatment.  ITCHING:  If you experience itching with your medications, try taking only a single pain pill, or even half a pain pill at a time.  You can also use Benadryl  over the counter for itching or also to help with sleep.   TED HOSE STOCKINGS:  Use stockings on both legs  until for at least 2 weeks or as directed by physician office. They may be removed at night for sleeping.  MEDICATIONS:  See your medication summary on the "After Visit Summary" that nursing will review with you.  You may have some home medications which will be placed on hold until you complete the course of blood thinner medication.  It is important for you to complete the blood thinner medication as prescribed.  Blood clot prevention (DVT Prophylaxis): After surgery you are at an increased risk for a blood clot.  You were prescribed a blood thinner, Eliquis  2.5mg , to be taken twice daily for a total of 4 weeks from surgery to help reduce your risk of getting a blood clot.  Signs of a pulmonary embolus (blood clot in the lungs) include sudden short of breath, feeling lightheaded or dizzy, chest pain with a deep breath, rapid pulse rapid breathing.  Signs of a blood clot in your arms or legs include new unexplained swelling and cramping, warm, red or darkened skin around the painful area.  Please call the office or 911 right away if these signs or symptoms  develop.  PRECAUTIONS:   If you experience chest pain or shortness of breath - call 911 immediately for transfer to the hospital emergency department.   If you develop a fever greater that 101 F, purulent drainage from wound, increased redness or drainage from wound, foul odor from the wound/dressing, or calf pain - CONTACT YOUR SURGEON.                                                   FOLLOW-UP APPOINTMENTS:  If you do not already have a post-op appointment, please call the office for an appointment to be seen by your surgeon.  Guidelines for how soon to be seen are listed in your "After Visit Summary", but are typically between 2-3 weeks after surgery.  If you have a specialized bandage, you may be told to follow up 1 week after surgery.  OTHER INSTRUCTIONS:  Knee Replacement:  Do not place pillow under knee, focus on keeping the knee straight while resting.  Place foam block, curve side up under heel at all times except when walking.  DO NOT modify, tear, cut, or change the foam block in any way.  POST-OPERATIVE OPIOID TAPER INSTRUCTIONS: It is important to wean off of your opioid medication as soon as possible. If you do not need pain medication after your surgery it is ok to stop day one. Opioids include: Codeine, Hydrocodone (Norco, Vicodin), Oxycodone (Percocet, oxycontin ) and hydromorphone  amongst others.  Long term and even short term use of opiods can cause: Increased pain response Dependence Constipation Depression Respiratory depression And more.  Withdrawal symptoms can include Flu like symptoms Nausea, vomiting And more Techniques to manage these symptoms Hydrate well Eat regular healthy meals Stay active Use relaxation techniques(deep breathing, meditating, yoga) Do Not substitute Alcohol  to help with tapering If you have been on opioids for less than two weeks and do not have pain than it is ok to stop all together.  Plan to wean off of opioids This plan should start  within one week post op of your joint replacement. Maintain the same interval or time between taking each dose and first decrease the dose.  Cut the total daily intake of opioids by one tablet each day  Next start to increase the time between doses. The last dose that should be eliminated is the evening dose.   MAKE SURE YOU:  Understand these instructions.  Get help right away if you are not doing well or get worse.    Thank you for letting us  be a part of your medical care team.  It is a privilege we respect greatly.  We hope these instructions will help you stay on track for a fast and full recovery!   __________________________________________________  Information on my medicine - ELIQUIS  (apixaban )  This medication education was reviewed with me or my healthcare representative as part of my discharge preparation.     Why was Eliquis  prescribed for you? Eliquis  was prescribed for you to reduce the risk of blood clots forming after orthopedic surgery.    What do You need to know about Eliquis ? Take your Eliquis  TWICE DAILY - one tablet in the morning and one tablet in the evening with or without food.  It would be best to take the dose about the same time each day.  If you have difficulty swallowing the tablet whole please discuss with your pharmacist how to take the medication safely.  Take Eliquis  exactly as prescribed by your doctor and DO NOT stop taking Eliquis  without talking to the doctor who prescribed the medication.  Stopping without other medication to take the place of Eliquis  may increase your risk of developing a clot.  After discharge, you should have regular check-up appointments with your healthcare provider that is prescribing your Eliquis .  What do you do if you miss a dose? If a dose of ELIQUIS  is not taken at the scheduled time, take it as soon as possible on the same day and twice-daily administration should be resumed.  The dose should not be  doubled to make up for a missed dose.  Do not take more than one tablet of ELIQUIS  at the same time.  Important Safety Information A possible side effect of Eliquis  is bleeding. You should call your healthcare provider right away if you experience any of the following: Bleeding from an injury or your nose that does not stop. Unusual colored urine (red or dark brown) or unusual colored stools (red or black). Unusual bruising for unknown reasons. A serious fall or if you hit your head (even if there is no bleeding).  Some medicines may interact with Eliquis  and might increase your risk of bleeding or clotting while on Eliquis . To help avoid this, consult your healthcare provider or pharmacist prior to using any new prescription or non-prescription medications, including herbals, vitamins, non-steroidal anti-inflammatory drugs (NSAIDs) and supplements.  This website has more information on Eliquis  (apixaban ): http://www.eliquis .com/eliquis dena          Signed: Nguyet Mercer A Beth Spackman 01/24/2024, 6:49 AM

## 2024-01-24 NOTE — TOC Transition Note (Signed)
 Transition of Care [facility]) - Discharge Note   Patient Details  Name: [name] MRN: [id number] Date of Birth: 25-Mar-1945  [facility]) CM/SW Contact:  Alfonse JONELLE Rex, RN Phone Number: 01/24/2024, 11:07 AM   Clinical Narrative:   Met with patient at bedside to review dc therapy and home equipment needs, pt confirmed OPPT- [address], has RW, no home DME needs. MOON completed. No TOC needs.     Final next level of care: OP Rehab Barriers to Discharge: No Barriers Identified   Patient Goals and CMS Choice Patient states their goals for this hospitalization and ongoing recovery are:: return home          Discharge Placement                       Discharge Plan and Services Additional resources added to the After Visit Summary for                                       Social Drivers of Health (SDOH) Interventions SDOH Screenings   Food Insecurity: No Food Insecurity (01/23/2024)  Housing: Low Risk  (01/23/2024)  Transportation Needs: No Transportation Needs (01/23/2024)  Utilities: Not At Risk (01/23/2024)  Social Connections: Moderately Integrated (01/23/2024)  Tobacco Use: Low Risk  (01/23/2024)     Readmission Risk Interventions     No data to display

## 2024-01-24 NOTE — Plan of Care (Signed)
   Problem: Health Behavior/Discharge Planning: Goal: Ability to manage health-related needs will improve Outcome: Progressing   Problem: Clinical Measurements: Goal: Ability to maintain clinical measurements within normal limits will improve Outcome: Progressing

## 2024-01-24 NOTE — Progress Notes (Signed)
     Subjective:  Patient reports pain as mild.  Worked well with physical therapy yesterday mobilizing 50 feet.  Reports that she slept poorly overnight.  Denies distal n/t.  Would like to try to discharge home today.  Yesterday's total administered Morphine  Milligram Equivalents: 60   Objective:   VITALS:   Vitals:   01/23/24 1801 01/23/24 2120 01/24/24 0140 01/24/24 0606  BP: 133/69 122/77 118/78 123/76  Pulse: (!) 58 69 78 71  Resp: 15 17 17 16   Temp: 97.7 F (36.5 C) 98.2 F (36.8 C) 98.2 F (36.8 C) 97.6 F (36.4 C)  TempSrc: Oral Oral Oral Oral  SpO2: 97% 96% 95% 97%  Weight:      Height:        Sensation intact distally Intact pulses distally Dorsiflexion/Plantar flexion intact Incision: dressing C/D/I Compartment soft    Lab Results  Component Value Date   WBC 6.8 01/24/2024   HGB 8.7 (L) 01/24/2024   HCT 28.3 (L) 01/24/2024   MCV 78.4 (L) 01/24/2024   PLT 199 01/24/2024   BMET    Component Value Date/Time   NA 135 01/24/2024 0353   K 4.6 01/24/2024 0353   CL 104 01/24/2024 0353   CO2 23 01/24/2024 0353   GLUCOSE 243 (H) 01/24/2024 0353   BUN 17 01/24/2024 0353   CREATININE 0.91 01/24/2024 0353   CALCIUM  8.7 (L) 01/24/2024 0353   GFRNONAA >60 01/24/2024 0353      Xray: Total knee arthroplasty components in good position under suture  Assessment/Plan: 1 Day Post-Op   Principal Problem:   Primary osteoarthritis of left knee  Status post left TKA done 01/23/2024  Post op recs: WB: WBAT Abx: ancef  Imaging: PACU xrays DVT prophylaxis: Eliquis  2.5 mg BID x4 weeks (history of DVT) Follow up: 2 weeks after surgery for a wound check with Dr. Edna at [facility].  Address: [address], [place], [place] [address]  Office Phone: [phone]   TORIBIO DELENA EDNA 01/24/2024, 6:48 AM   Toribio Edna, MD  Contact information:   [phone] 7am-5pm epic message Dr. Edna, or call office for patient  follow up: [phone] After hours and holidays please check [email or web address] for group call information for [facility]

## 2024-01-24 NOTE — Progress Notes (Signed)
 Physical Therapy Treatment Patient Details Name: [name] MRN: [id number] DOB: Jun 25, 1945 Today's Date: 01/24/2024   History of Present Illness 78 yo female s/p L TKA on 01/23/24. PMH: closed L tibial plateau fx 2024, DOE, CVA, trochanteric bursitis, HTN, fibromyalgia, OSA, DM, DVT    PT Comments  Pt progressing well, meeting PT goals and is ready to d/c from PT standpoint with family assisting as needed. Pain controlled EOS.   If plan is discharge home, recommend the following: A little help with walking and/or transfers;A little help with bathing/dressing/bathroom;Help with stairs or ramp for entrance;Assist for transportation;Assistance with cooking/housework   Can travel by private vehicle        Equipment Recommendations  None recommended by PT    Recommendations for Other Services       Precautions / Restrictions Precautions Precautions: Fall;Knee Recall of Precautions/Restrictions: Intact Restrictions Weight Bearing Restrictions Per Provider Order: No Other Position/Activity Restrictions: WBAT     Mobility  Bed Mobility               General bed mobility comments: in recliner    Transfers Overall transfer level: Needs assistance Equipment used: Rolling walker (2 wheels) Transfers: Sit to/from Stand Sit to Stand: Supervision, Contact guard assist           General transfer comment: cues for hand placement and LLE position    Ambulation/Gait Ambulation/Gait assistance: Supervision, Contact guard assist Gait Distance (Feet): 80 Feet Assistive device: Rolling walker (2 wheels) Gait Pattern/deviations: Step-to pattern, Step-through pattern       General Gait Details: cues for sequence and RW position, CGA fade to supervision for safety.  no LOB, no instability   Stairs             Wheelchair Mobility     Tilt Bed    Modified Rankin (Stroke Patients Only)       Balance                                             Communication Communication Communication: No apparent difficulties  Cognition Arousal: Alert Behavior During Therapy: WFL for tasks assessed/performed   PT - Cognitive impairments: No apparent impairments                         Following commands: Intact      Cueing Cueing Techniques: Verbal cues  Exercises Total Joint Exercises Ankle Circles/Pumps: AROM, Both, 10 reps Quad Sets: AROM, Both, 10 reps Heel Slides: AAROM, Left, 10 reps    General Comments        Pertinent Vitals/Pain Pain Assessment Pain Assessment: Faces Faces Pain Scale: Hurts little more Pain Location: L knee Pain Descriptors / Indicators: Sore Pain Intervention(s): Limited activity within patient's tolerance, Monitored during session, Repositioned, Ice applied, Premedicated before session    Home Living                          Prior Function            PT Goals (current goals can now be found in the care plan section) Acute Rehab PT Goals PT Goal Formulation: With patient Time For Goal Achievement: 01/30/24 Potential to Achieve Goals: Good Progress towards PT goals: Progressing toward goals    Frequency    7X/week  PT Plan      Co-evaluation              AM-PAC PT 6 Clicks Mobility   Outcome Measure  Help needed turning from your back to your side while in a flat bed without using bedrails?: A Little Help needed moving from lying on your back to sitting on the side of a flat bed without using bedrails?: A Little Help needed moving to and from a bed to a chair (including a wheelchair)?: A Little Help needed standing up from a chair using your arms (e.g., wheelchair or bedside chair)?: A Little Help needed to walk in hospital room?: A Little Help needed climbing 3-5 steps with a railing? : A Little 6 Click Score: 18    End of Session Equipment Utilized During Treatment: Gait belt Activity Tolerance: Patient tolerated treatment  well Patient left: in chair;with call bell/phone within reach;with chair alarm set   PT Visit Diagnosis: Other abnormalities of gait and mobility (R26.89)     Time: 1005-1020 PT Time Calculation (min) (ACUTE ONLY): 15 min  Charges:    $Gait Training: 8-22 mins PT General Charges $$ ACUTE PT VISIT: 1 Visit                     Kassiah Mccrory, PT  Acute Rehab Dept [facility]) [phone]  01/24/2024    [facility] 01/24/2024, 10:36 AM

## 2024-01-24 NOTE — Progress Notes (Signed)
 Discharge instructions given to patient and all questions were answered.

## 2024-01-24 NOTE — Care Management Obs Status (Signed)
 MEDICARE OBSERVATION STATUS NOTIFICATION   Patient Details  Name: [name] MRN: [id number] Date of Birth: [birth year]   Medicare Observation Status Notification Given:  Yes    Alfonse JONELLE Rex, RN 01/24/2024, 10:51 AM

## 2024-01-25 ENCOUNTER — Other Ambulatory Visit (HOSPITAL_COMMUNITY): Payer: Self-pay

## 2024-01-26 DIAGNOSIS — M1712 Unilateral primary osteoarthritis, left knee: Secondary | ICD-10-CM | POA: Diagnosis not present

## 2024-01-26 DIAGNOSIS — R262 Difficulty in walking, not elsewhere classified: Secondary | ICD-10-CM | POA: Diagnosis not present

## 2024-01-26 DIAGNOSIS — M25662 Stiffness of left knee, not elsewhere classified: Secondary | ICD-10-CM | POA: Diagnosis not present

## 2024-01-31 DIAGNOSIS — M25662 Stiffness of left knee, not elsewhere classified: Secondary | ICD-10-CM | POA: Diagnosis not present

## 2024-01-31 DIAGNOSIS — M6281 Muscle weakness (generalized): Secondary | ICD-10-CM | POA: Diagnosis not present

## 2024-01-31 DIAGNOSIS — R262 Difficulty in walking, not elsewhere classified: Secondary | ICD-10-CM | POA: Diagnosis not present

## 2024-01-31 DIAGNOSIS — M1712 Unilateral primary osteoarthritis, left knee: Secondary | ICD-10-CM | POA: Diagnosis not present

## 2024-02-06 DIAGNOSIS — E1159 Type 2 diabetes mellitus with other circulatory complications: Secondary | ICD-10-CM | POA: Diagnosis not present

## 2024-02-06 DIAGNOSIS — M1712 Unilateral primary osteoarthritis, left knee: Secondary | ICD-10-CM | POA: Diagnosis not present

## 2024-02-06 DIAGNOSIS — Z885 Allergy status to narcotic agent status: Secondary | ICD-10-CM | POA: Diagnosis not present

## 2024-02-06 DIAGNOSIS — Z96659 Presence of unspecified artificial knee joint: Secondary | ICD-10-CM | POA: Diagnosis not present

## 2024-02-06 DIAGNOSIS — Z794 Long term (current) use of insulin: Secondary | ICD-10-CM | POA: Diagnosis not present

## 2024-02-06 DIAGNOSIS — E039 Hypothyroidism, unspecified: Secondary | ICD-10-CM | POA: Diagnosis not present

## 2024-02-06 DIAGNOSIS — Z8673 Personal history of transient ischemic attack (TIA), and cerebral infarction without residual deficits: Secondary | ICD-10-CM | POA: Diagnosis not present

## 2024-02-06 DIAGNOSIS — M797 Fibromyalgia: Secondary | ICD-10-CM | POA: Diagnosis not present

## 2024-02-06 DIAGNOSIS — Z7982 Long term (current) use of aspirin: Secondary | ICD-10-CM | POA: Diagnosis not present

## 2024-02-06 DIAGNOSIS — M6281 Muscle weakness (generalized): Secondary | ICD-10-CM | POA: Diagnosis not present

## 2024-02-06 DIAGNOSIS — R011 Cardiac murmur, unspecified: Secondary | ICD-10-CM | POA: Diagnosis not present

## 2024-02-06 DIAGNOSIS — Z9989 Dependence on other enabling machines and devices: Secondary | ICD-10-CM | POA: Diagnosis not present

## 2024-02-06 DIAGNOSIS — F329 Major depressive disorder, single episode, unspecified: Secondary | ICD-10-CM | POA: Diagnosis not present

## 2024-02-06 DIAGNOSIS — R262 Difficulty in walking, not elsewhere classified: Secondary | ICD-10-CM | POA: Diagnosis not present

## 2024-02-06 DIAGNOSIS — E669 Obesity, unspecified: Secondary | ICD-10-CM | POA: Diagnosis not present

## 2024-02-06 DIAGNOSIS — G4733 Obstructive sleep apnea (adult) (pediatric): Secondary | ICD-10-CM | POA: Diagnosis not present

## 2024-02-06 DIAGNOSIS — I10 Essential (primary) hypertension: Secondary | ICD-10-CM | POA: Diagnosis not present

## 2024-02-06 DIAGNOSIS — M810 Age-related osteoporosis without current pathological fracture: Secondary | ICD-10-CM | POA: Diagnosis not present

## 2024-02-06 DIAGNOSIS — F411 Generalized anxiety disorder: Secondary | ICD-10-CM | POA: Diagnosis not present

## 2024-02-06 DIAGNOSIS — Z7901 Long term (current) use of anticoagulants: Secondary | ICD-10-CM | POA: Diagnosis not present

## 2024-02-06 DIAGNOSIS — Z791 Long term (current) use of non-steroidal anti-inflammatories (NSAID): Secondary | ICD-10-CM | POA: Diagnosis not present

## 2024-02-06 DIAGNOSIS — E785 Hyperlipidemia, unspecified: Secondary | ICD-10-CM | POA: Diagnosis not present

## 2024-02-06 DIAGNOSIS — K219 Gastro-esophageal reflux disease without esophagitis: Secondary | ICD-10-CM | POA: Diagnosis not present

## 2024-02-06 DIAGNOSIS — M1990 Unspecified osteoarthritis, unspecified site: Secondary | ICD-10-CM | POA: Diagnosis not present

## 2024-02-06 DIAGNOSIS — M25662 Stiffness of left knee, not elsewhere classified: Secondary | ICD-10-CM | POA: Diagnosis not present

## 2024-02-07 DIAGNOSIS — Z96652 Presence of left artificial knee joint: Secondary | ICD-10-CM | POA: Diagnosis not present

## 2024-02-09 DIAGNOSIS — M1712 Unilateral primary osteoarthritis, left knee: Secondary | ICD-10-CM | POA: Diagnosis not present

## 2024-02-09 DIAGNOSIS — M6281 Muscle weakness (generalized): Secondary | ICD-10-CM | POA: Diagnosis not present

## 2024-02-09 DIAGNOSIS — M25662 Stiffness of left knee, not elsewhere classified: Secondary | ICD-10-CM | POA: Diagnosis not present

## 2024-02-09 DIAGNOSIS — R262 Difficulty in walking, not elsewhere classified: Secondary | ICD-10-CM | POA: Diagnosis not present

## 2024-02-10 NOTE — Telephone Encounter (Signed)
 Received provider portion of patient assistance application  Spoke with patient she did receive applications and will put in mail

## 2024-02-13 DIAGNOSIS — M1712 Unilateral primary osteoarthritis, left knee: Secondary | ICD-10-CM | POA: Diagnosis not present

## 2024-02-13 DIAGNOSIS — R262 Difficulty in walking, not elsewhere classified: Secondary | ICD-10-CM | POA: Diagnosis not present

## 2024-02-13 DIAGNOSIS — M6281 Muscle weakness (generalized): Secondary | ICD-10-CM | POA: Diagnosis not present

## 2024-02-13 DIAGNOSIS — M25662 Stiffness of left knee, not elsewhere classified: Secondary | ICD-10-CM | POA: Diagnosis not present

## 2024-02-20 NOTE — Telephone Encounter (Signed)
 PAP: Application for Novolog  and Tresiba  has been submitted to [employer], via fax

## 2024-02-21 ENCOUNTER — Ambulatory Visit: Payer: Medicare HMO | Admitting: Internal Medicine

## 2024-02-21 DIAGNOSIS — Z96652 Presence of left artificial knee joint: Secondary | ICD-10-CM | POA: Diagnosis not present

## 2024-02-27 NOTE — Telephone Encounter (Signed)
 PAP: Patient assistance application for Novolog  and Tresiba  has been approved by PAP Companies: [employer] from 03/22/2024 to 03/21/2025. Medication should be delivered to PAP Delivery: Provider's office. For further shipping updates, please contact [employer] at [phone]. Patient ID is: [id number]

## 2024-03-28 ENCOUNTER — Other Ambulatory Visit: Payer: Self-pay | Admitting: Internal Medicine

## 2024-03-28 DIAGNOSIS — Z1231 Encounter for screening mammogram for malignant neoplasm of breast: Secondary | ICD-10-CM

## 2024-04-16 ENCOUNTER — Other Ambulatory Visit: Payer: Self-pay | Admitting: Medical Genetics

## 2024-04-19 ENCOUNTER — Other Ambulatory Visit (HOSPITAL_COMMUNITY)
Admission: RE | Admit: 2024-04-19 | Discharge: 2024-04-19 | Disposition: A | Discharge status: Home / Self Care | Payer: Self-pay | Source: Ambulatory Visit | Attending: Oncology | Admitting: Oncology

## 2024-04-20 ENCOUNTER — Ambulatory Visit
